# Patient Record
Sex: Female | Born: 1955 | Race: White | Hispanic: No | Marital: Married | Smoking: Current every day smoker
Health system: Southern US, Community
[De-identification: ages and names within clinical notes are randomized; demographics above are authoritative.]

## PROBLEM LIST (undated history)

## (undated) HISTORY — PX: NECK SURGERY: SHX720

## (undated) HISTORY — PX: APPENDECTOMY: SHX54

## (undated) HISTORY — PX: SMALL INTESTINE SURGERY: SHX150

## (undated) HISTORY — PX: BUNIONECTOMY: SHX129

## (undated) HISTORY — PX: TONSILLECTOMY: SHX5217

---

## 1997-10-12 ENCOUNTER — Other Ambulatory Visit: Admission: RE | Admit: 1997-10-12 | Discharge: 1997-10-12 | Payer: Self-pay | Admitting: Gynecology

## 2005-08-03 ENCOUNTER — Inpatient Hospital Stay (HOSPITAL_COMMUNITY): Admission: RE | Admit: 2005-08-03 | Discharge: 2005-08-04 | Payer: Self-pay | Admitting: Neurosurgery

## 2011-05-01 ENCOUNTER — Emergency Department (HOSPITAL_COMMUNITY)
Admission: EM | Admit: 2011-05-01 | Discharge: 2011-05-01 | Disposition: A | Discharge status: Home / Self Care | Payer: No Typology Code available for payment source | Attending: Emergency Medicine | Admitting: Emergency Medicine

## 2011-05-01 ENCOUNTER — Emergency Department (HOSPITAL_COMMUNITY): Payer: No Typology Code available for payment source

## 2011-05-01 DIAGNOSIS — R51 Headache: Secondary | ICD-10-CM | POA: Insufficient documentation

## 2011-05-01 DIAGNOSIS — M542 Cervicalgia: Secondary | ICD-10-CM | POA: Insufficient documentation

## 2016-01-29 DIAGNOSIS — M159 Polyosteoarthritis, unspecified: Secondary | ICD-10-CM

## 2016-01-29 DIAGNOSIS — I10 Essential (primary) hypertension: Secondary | ICD-10-CM

## 2016-01-29 DIAGNOSIS — Z79899 Other long term (current) drug therapy: Secondary | ICD-10-CM

## 2016-01-29 DIAGNOSIS — K219 Gastro-esophageal reflux disease without esophagitis: Secondary | ICD-10-CM | POA: Insufficient documentation

## 2016-01-29 HISTORY — DX: Essential (primary) hypertension: I10

## 2016-01-29 HISTORY — DX: Other long term (current) drug therapy: Z79.899

## 2016-01-29 HISTORY — DX: Gastro-esophageal reflux disease without esophagitis: K21.9

## 2016-01-29 HISTORY — DX: Polyosteoarthritis, unspecified: M15.9

## 2016-01-30 DIAGNOSIS — E119 Type 2 diabetes mellitus without complications: Secondary | ICD-10-CM

## 2016-01-30 HISTORY — DX: Type 2 diabetes mellitus without complications: E11.9

## 2017-12-02 DIAGNOSIS — F17200 Nicotine dependence, unspecified, uncomplicated: Secondary | ICD-10-CM

## 2017-12-02 HISTORY — DX: Nicotine dependence, unspecified, uncomplicated: F17.200

## 2019-05-01 DIAGNOSIS — E782 Mixed hyperlipidemia: Secondary | ICD-10-CM

## 2019-05-01 HISTORY — DX: Mixed hyperlipidemia: E78.2

## 2019-06-12 DIAGNOSIS — Z6830 Body mass index (BMI) 30.0-30.9, adult: Secondary | ICD-10-CM

## 2019-06-12 DIAGNOSIS — E6609 Other obesity due to excess calories: Secondary | ICD-10-CM

## 2019-06-12 HISTORY — DX: Body mass index (BMI) 30.0-30.9, adult: Z68.30

## 2019-06-12 HISTORY — DX: Other obesity due to excess calories: E66.09

## 2019-07-17 DIAGNOSIS — F17210 Nicotine dependence, cigarettes, uncomplicated: Secondary | ICD-10-CM | POA: Insufficient documentation

## 2019-07-17 DIAGNOSIS — M2420 Disorder of ligament, unspecified site: Secondary | ICD-10-CM

## 2019-07-17 HISTORY — DX: Disorder of ligament, unspecified site: M24.20

## 2021-01-13 DIAGNOSIS — R0602 Shortness of breath: Secondary | ICD-10-CM

## 2021-01-13 DIAGNOSIS — J4520 Mild intermittent asthma, uncomplicated: Secondary | ICD-10-CM

## 2021-01-13 HISTORY — DX: Shortness of breath: R06.02

## 2021-01-13 HISTORY — DX: Mild intermittent asthma, uncomplicated: J45.20

## 2021-01-15 DIAGNOSIS — R7982 Elevated C-reactive protein (CRP): Secondary | ICD-10-CM

## 2021-01-15 HISTORY — DX: Elevated C-reactive protein (CRP): R79.82

## 2021-01-16 ENCOUNTER — Encounter: Payer: Self-pay | Admitting: Cardiology

## 2021-01-16 ENCOUNTER — Encounter: Payer: Self-pay | Admitting: *Deleted

## 2021-02-18 NOTE — Progress Notes (Signed)
Cardiology Office Note:    Date:  02/19/2021   ID:  ROYLENE HEATON, DOB 09-29-1955, MRN [id number]  PCP:  Gordan Payment., MD  Cardiologist:  Norman Herrlich, MD   Referring MD: Gordan Payment., MD  ASSESSMENT:    1. Chest pain of uncertain etiology   2. CRP elevated   3. Type 2 diabetes mellitus without complication, without long-term current use of insulin (HCC)   4. Mixed hyperlipidemia   5. Essential hypertension   6. Cigarette smoker   7. Hypertensive heart disease without heart failure     PLAN:    In order of problems listed above:  She is at high cardiovascular risk age diabetes hypertension hyperlipidemia cigarette smoking her LDL cholesterol is elevated as is her high-sensitivity CRP and she agrees for lipid-lowering therapy she does not want a atorvastatin and prefers a low intensity statin and I will place her on pravastatin 20 mg daily pending cardiac CTA.  This will give [place] a calcium score to define the burden of coronary atherosclerosis and allow [place] to define if she has significant CAD.  If high risk markers would benefit from angiography and revascularization. CRP is another risk factor for heart disease. Stable managed by her PCP Untreated initiate statin therapy she request a low intensity statin she is afraid of drugs like atorvastatin Stable BP controlled although elevated in my office today she will continue her current treatment beta-blocker and ARB. Previously stopped smoking with hypnotism she is down to less than 10 cigarettes a day offered her nicotine substitutes she declined and said she would send me a MyChart message if she would like it.  Next appointment 4 weeks after cardiac CT   Medication Adjustments/Labs and Tests Ordered: Current medicines are reviewed at length with the patient today.  Concerns regarding medicines are outlined above.  Orders Placed This Encounter  Procedures   CT CORONARY MORPH W/CTA COR W/SCORE W/CA W/CM &/OR WO/CM   Basic  metabolic panel   EKG 12-Lead   Meds ordered this encounter  Medications   pravastatin (PRAVACHOL) 20 MG tablet    Sig: Take 1 tablet (20 mg total) by mouth every evening.    Dispense:  90 tablet    Refill:  3   metoprolol tartrate (LOPRESSOR) 100 MG tablet    Sig: Take 1 tablet (100 mg total) by mouth once for 1 dose. Take one tablet by mouth two hours prior to your cardiac CT    Dispense:  1 tablet    Refill:  0     Chief Complaint  Patient presents with   Chest Pain    I am concerned already of heart disease     History of Present Illness:    FREDDA Serrano is a 65 y.o. with multiple cardiovascular risk including ongoing cigarette smoking type 2 diabetes hyperlipidemia and family history of CAD female who is being seen today for the evaluation of shortness of breath at the request of Gordan Payment., MD. she recently has been seen in the office one of her complaints is arthritis.  Laboratory studies 01/16/2021 showed a hemoglobin A1c of 8.6% CMP normal potassium 4.7 creatinine 0.75 GFR 88 cc per minute TSH normal 3.37 Her lipid profile 01/13/2021 shows a cholesterol total 234 LDL 167 triglycerides 197 HDL 56 non-HDL cholesterol severely elevated for diabetes 178 Hemoglobin 14.7 platelets 308,000 sedimentation rate was normal at 15 and high-sensitivity CRP was elevated at 3.2  I have cared for her husband  and she is known to me. She has no background history of heart disease congenital rheumatic or atrial fibrillation. She has multiple cardiovascular risk including her diabetes hyperlipidemia hypertension and ongoing cigarette smoking and is concerned regarding her cardiovascular risk She also tells me she has had a few episodes of chest discomfort she describes as heaviness not with activity but caused her to stop and rest and she is relieved.  None have occurred at nighttime or awaken her from her sleep and she is always relieved with rest of this concern should be already of  heart disease. Her referral says shortness of breath but she tells me that its not a problem. No edema orthopnea palpitation or syncope. 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 Relevant Hx: Course: Daily Update: Today's Plan:discussed with her that her sugars will increase and she has actively worked on her diet and diet and lost from 242 to 188 which I commend her on, she is to continue with her weight loss efforts despite taking this   Electronically signed by: Sandi Mealy    Past Surgical History:  Procedure Laterality Date   APPENDECTOMY     BUNIONECTOMY Right    NECK SURGERY     SMALL INTESTINE SURGERY     TONSILLECTOMY      Current Medications: Current Meds  Medication Sig   albuterol (VENTOLIN HFA) 108 (90 Base) MCG/ACT inhaler Inhale 2 puffs into the lungs every 6 (six) hours as needed for wheezing or shortness of breath.   aspirin 81 MG chewable tablet Chew 81 mg by mouth daily.   Cinnamon 500 MG capsule Take 500 mg by mouth daily.   clobetasol cream (TEMOVATE) 0.05 % Apply 1 application topically 2 (two) times daily.   Green Tea 150 MG CAPS Take 1 capsule by mouth daily.    ibuprofen (ADVIL) 800 MG tablet Take 800 mg by mouth every 6 (six) hours as needed for pain.   losartan (COZAAR) 50 MG tablet Take 1 tablet by mouth daily.   metFORMIN (GLUCOPHAGE-XR) 500 MG 24 hr tablet Take 500 mg by mouth 2 (two) times daily.   metoprolol tartrate (LOPRESSOR) 100 MG tablet Take 1 tablet (100 mg total) by mouth once for 1 dose. Take one tablet by mouth two hours prior to your cardiac CT   Multiple Vitamin (MULTIVITAMIN) capsule Take 1 capsule by mouth daily.   Omega-3 1000 MG CAPS Take 1 g by mouth daily.   pravastatin (PRAVACHOL) 20 MG tablet Take 1 tablet (20 mg total) by mouth every evening.     Allergies:   Patient has no known allergies.   Social History   Socioeconomic History   Marital status: Married    Spouse name:  Not on file   Number of children: Not on file   Years of education: Not on file   Highest education level: Not on file  Occupational History   Not on file  Tobacco Use   Smoking status: Every Day    Packs/day: 0.50    Pack years: 0.00    Types: Cigarettes   Smokeless tobacco: Never  Vaping Use   Vaping Use: Never used  Substance and Sexual Activity   Alcohol use: Yes    Alcohol/week: 1.0 standard drink    Types: 1 Glasses of wine per week    Comment: Socially   Drug use: Never   Sexual activity: Not on file  Other Topics Concern   Not on file  Social History Narrative   Not on file   Social Determinants of Health   Financial Resource Strain: Not on file  Food Insecurity: Not on file  Transportation Needs: Not on file  Physical Activity: Not on file  Stress: Not on file  Social Connections: Not on file     Family History: The patient's family history includes Angina in her father; Diabetes in her mother and paternal grandmother; Heart attack (age of onset: 83) in her father; Heart attack (age of onset: 87) in her mother.  ROS:   ROS Please see the history of present illness.     All other systems reviewed and are  negative.  EKGs/Labs/Other Studies Reviewed:    The following studies were reviewed today:   EKG:  EKG is  ordered today.  The ekg ordered today is personally reviewed and demonstrates sinus rhythm minor nonspecific ST abnormality   Physical Exam:    VS:  BP (!) 145/90 (BP Location: Right Arm, Patient Position: Sitting, Cuff Size: Large) Comment (BP Location): Re-check  Pulse 75   Ht 5' 6.5" (1.689 m)   Wt 187 lb 3.2 oz (84.9 kg)   BMI 29.76 kg/m     Wt Readings from Last 3 Encounters:  02/19/21 187 lb 3.2 oz (84.9 kg)  01/13/21 186 lb (84.4 kg)     GEN: Appears her age well nourished, well developed in no acute distress HEENT: Normal NECK: No JVD; No carotid bruits LYMPHATICS: No lymphadenopathy CARDIAC: RRR, no murmurs, rubs, gallops RESPIRATORY:  Clear to auscultation without rales, wheezing or rhonchi  ABDOMEN: Soft, non-tender, non-distended MUSCULOSKELETAL:  No edema; No deformity  SKIN: Warm and dry NEUROLOGIC:  Alert and oriented x 3 PSYCHIATRIC:  Normal affect     Signed, Norman Herrlich, MD  02/19/2021 4:21 PM    [facility]

## 2021-02-19 ENCOUNTER — Encounter: Payer: Self-pay | Admitting: Cardiology

## 2021-02-19 ENCOUNTER — Ambulatory Visit (INDEPENDENT_AMBULATORY_CARE_PROVIDER_SITE_OTHER): Payer: Medicare Other | Admitting: Cardiology

## 2021-02-19 ENCOUNTER — Other Ambulatory Visit: Payer: Self-pay

## 2021-02-19 VITALS — BP 145/90 | HR 75 | Ht 66.5 in | Wt 187.2 lb

## 2021-02-19 DIAGNOSIS — E119 Type 2 diabetes mellitus without complications: Secondary | ICD-10-CM

## 2021-02-19 DIAGNOSIS — I119 Hypertensive heart disease without heart failure: Secondary | ICD-10-CM

## 2021-02-19 DIAGNOSIS — R7982 Elevated C-reactive protein (CRP): Secondary | ICD-10-CM

## 2021-02-19 DIAGNOSIS — R079 Chest pain, unspecified: Secondary | ICD-10-CM

## 2021-02-19 DIAGNOSIS — I10 Essential (primary) hypertension: Secondary | ICD-10-CM | POA: Diagnosis not present

## 2021-02-19 DIAGNOSIS — E782 Mixed hyperlipidemia: Secondary | ICD-10-CM

## 2021-02-19 DIAGNOSIS — F17210 Nicotine dependence, cigarettes, uncomplicated: Secondary | ICD-10-CM

## 2021-02-19 MED ORDER — PRAVASTATIN SODIUM 20 MG PO TABS: 20.0000 mg | ORAL_TABLET | Freq: Every evening | ORAL | 3 refills | Status: DC

## 2021-02-19 MED ORDER — METOPROLOL TARTRATE 100 MG PO TABS: 100.0000 mg | ORAL_TABLET | Freq: Once | ORAL | 0 refills | Status: DC

## 2021-02-19 NOTE — Patient Instructions (Signed)
Medication Instructions:  Your physician has recommended you make the following change in your medication:  START: Pravastatin 20 mg take one tablet by mouth daily.   *If you need a refill on your cardiac medications before your next appointment, please call your pharmacy*   Lab Work: Your physician recommends that you return for lab work in: WITHIN ONE WEEK OF YOUR CT  BMP If you have labs (blood work) drawn today and your tests are completely normal, you will receive your results only by: MyChart Message (if you have MyChart) OR A paper copy in the mail If you have any lab test that is abnormal or we need to change your treatment, we will call you to review the results.   Testing/Procedures: Your cardiac CT will be scheduled at the below location:   [facility] [address] [place], [place] [address] [phone]  If scheduled at [facility], please arrive at the [facility] main entrance (entrance A) of [facility] 30 minutes prior to test start time. [facility] (first floor) to check-in and test prep.  Please follow these instructions carefully (unless otherwise directed):   On the Night Before the Test: Be sure to Drink plenty of water. Do not consume any caffeinated/decaffeinated beverages or chocolate 12 hours prior to your test. Do not take any antihistamines 12 hours prior to your test.  On the Day of the Test: Drink plenty of water until 1 hour prior to the test. Do not eat any food 4 hours prior to the test. You may take your regular medications prior to the test.  Take metoprolol (Lopressor) two hours prior to test. FEMALES- please wear underwire-free bra if available  After the Test: Drink plenty of water. After receiving IV contrast, you may experience a mild flushed feeling. This is normal. On occasion, you may experience a mild rash up to 24 hours after the test. This is not dangerous. If  this occurs, you can take Benadryl 25 mg and increase your fluid intake. If you experience trouble breathing, this can be serious. If it is severe call 911 IMMEDIATELY. If it is mild, please call our office. If you take any of these medications: Glipizide/Metformin, Avandament, Glucavance, please do not take 48 hours after completing test unless otherwise instructed.   Once we have confirmed authorization from your insurance company, we will call you to set up a date and time for your test. Based on how quickly your insurance processes prior authorizations requests, please allow up to 4 weeks to be contacted for scheduling your Cardiac CT appointment. Be advised that routine Cardiac CT appointments could be scheduled as many as 8 weeks after your provider has ordered it.  For non-scheduling related questions, please contact the cardiac imaging nurse navigator should you have any questions/concerns: Rockwell Alexandria, Cardiac Imaging Nurse Navigator Larey Brick, Cardiac Imaging Nurse Navigator [facility] Direct Office Dial: [phone]   For scheduling needs, including cancellations and rescheduling, please call [place], [phone].    Follow-Up: At [facility], you and your health needs are our priority.  As part of our continuing mission to provide you with exceptional heart care, we have created designated Provider Care Teams.  These Care Teams include your primary Cardiologist (physician) and Advanced Practice Providers (APPs -  Physician Assistants and Nurse Practitioners) who all work together to provide you with the care you need, when you need it.  We recommend signing  up for the patient portal called "MyChart".  Sign up information is provided on this After Visit Summary.  MyChart is used to connect with patients for Virtual Visits (Telemedicine).  Patients are able to view lab/test results, encounter notes, upcoming appointments, etc.  Non-urgent messages  can be sent to your provider as well.   To learn more about what you can do with MyChart, go to [email or web address].    Your next appointment:   4 week(s)  The format for your next appointment:   In Person  Provider:   Norman Herrlich, MD   Other Instructions

## 2021-03-03 ENCOUNTER — Telehealth (HOSPITAL_COMMUNITY): Payer: Self-pay | Admitting: *Deleted

## 2021-03-03 NOTE — Telephone Encounter (Signed)
Attempted to call patient regarding upcoming cardiac CT appointment. °Left message on voicemail with name and callback number ° °Tylor Gambrill RN Navigator Cardiac Imaging °[facility] °[phone] Office °[phone] Cell ° °

## 2021-03-04 ENCOUNTER — Telehealth (HOSPITAL_COMMUNITY): Payer: Self-pay | Admitting: *Deleted

## 2021-03-04 ENCOUNTER — Telehealth: Payer: Self-pay

## 2021-03-04 LAB — BASIC METABOLIC PANEL
BUN/Creatinine Ratio: 13 (ref 12–28)
BUN: 9 mg/dL (ref 8–27)
CO2: 22 mmol/L (ref 20–29)
Calcium: 9.2 mg/dL (ref 8.7–10.3)
Chloride: 97 mmol/L (ref 96–106)
Creatinine, Ser: 0.69 mg/dL (ref 0.57–1.00)
Glucose: 263 mg/dL — ABNORMAL HIGH (ref 65–99)
Potassium: 4.3 mmol/L (ref 3.5–5.2)
Sodium: 137 mmol/L (ref 134–144)
eGFR: 96 mL/min/{1.73_m2} (ref >59)

## 2021-03-04 NOTE — Telephone Encounter (Signed)
Spoke with patient regarding results and recommendation.  Patient verbalizes understanding and is agreeable to plan of care. Advised patient to call back with any issues or concerns.  

## 2021-03-04 NOTE — Telephone Encounter (Signed)
-----   Message from Baldo Daub, MD sent at 03/04/2021  7:55 AM EDT ----- Normal or stable result  For cardiac CTA

## 2021-03-04 NOTE — Telephone Encounter (Signed)
Reaching out to patient to offer assistance regarding upcoming cardiac imaging study; pt verbalizes understanding of appt date/time, parking situation and where to check in, pre-test NPO status and medications ordered, and verified current allergies; name and call back number provided for further questions should they arise  Marjan Rosman RN Navigator Cardiac Imaging [facility] [phone] office [phone] cell  Pt to take 100mg metoprolol tartrate 2 hours prior to cardiac CT scan. 

## 2021-03-05 ENCOUNTER — Ambulatory Visit (HOSPITAL_COMMUNITY)
Admission: RE | Admit: 2021-03-05 | Discharge: 2021-03-05 | Disposition: A | Discharge status: Home / Self Care | Payer: Medicare Other | Source: Ambulatory Visit | Attending: Cardiology | Admitting: Cardiology

## 2021-03-05 ENCOUNTER — Ambulatory Visit (HOSPITAL_COMMUNITY)
Admission: RE | Admit: 2021-03-05 | Discharge: 2021-03-05 | Disposition: A | Discharge status: Home / Self Care | Payer: Medicare Other | Source: Ambulatory Visit | Attending: Internal Medicine | Admitting: Internal Medicine

## 2021-03-05 ENCOUNTER — Other Ambulatory Visit (HOSPITAL_COMMUNITY): Payer: Self-pay | Admitting: Emergency Medicine

## 2021-03-05 ENCOUNTER — Other Ambulatory Visit: Payer: Self-pay

## 2021-03-05 ENCOUNTER — Encounter (HOSPITAL_COMMUNITY): Payer: Self-pay

## 2021-03-05 DIAGNOSIS — E119 Type 2 diabetes mellitus without complications: Secondary | ICD-10-CM | POA: Diagnosis present

## 2021-03-05 DIAGNOSIS — I119 Hypertensive heart disease without heart failure: Secondary | ICD-10-CM | POA: Diagnosis present

## 2021-03-05 DIAGNOSIS — E782 Mixed hyperlipidemia: Secondary | ICD-10-CM | POA: Diagnosis present

## 2021-03-05 DIAGNOSIS — I10 Essential (primary) hypertension: Secondary | ICD-10-CM | POA: Insufficient documentation

## 2021-03-05 DIAGNOSIS — R7982 Elevated C-reactive protein (CRP): Secondary | ICD-10-CM | POA: Diagnosis present

## 2021-03-05 DIAGNOSIS — F17210 Nicotine dependence, cigarettes, uncomplicated: Secondary | ICD-10-CM | POA: Diagnosis present

## 2021-03-05 DIAGNOSIS — R079 Chest pain, unspecified: Secondary | ICD-10-CM

## 2021-03-05 DIAGNOSIS — R931 Abnormal findings on diagnostic imaging of heart and coronary circulation: Secondary | ICD-10-CM | POA: Diagnosis present

## 2021-03-05 DIAGNOSIS — I2510 Atherosclerotic heart disease of native coronary artery without angina pectoris: Secondary | ICD-10-CM | POA: Diagnosis not present

## 2021-03-05 MED ORDER — NITROGLYCERIN 0.4 MG SL SUBL
SUBLINGUAL_TABLET | SUBLINGUAL | Status: AC
  Administered 2021-03-05: 0.8 mg via SUBLINGUAL
  Filled 2021-03-05: qty 2

## 2021-03-05 MED ORDER — IOHEXOL 350 MG/ML SOLN
95.0000 mL | Freq: Once | INTRAVENOUS | Status: AC | PRN
  Administered 2021-03-05: 95 mL via INTRAVENOUS

## 2021-03-05 MED ORDER — NITROGLYCERIN 0.4 MG SL SUBL: 0.8000 mg | SUBLINGUAL_TABLET | Freq: Once | SUBLINGUAL | Status: AC

## 2021-03-06 ENCOUNTER — Ambulatory Visit (INDEPENDENT_AMBULATORY_CARE_PROVIDER_SITE_OTHER): Payer: Medicare Other | Admitting: Cardiology

## 2021-03-06 ENCOUNTER — Encounter: Payer: Self-pay | Admitting: Cardiology

## 2021-03-06 VITALS — BP 142/94 | HR 70 | Ht 66.5 in | Wt 187.0 lb

## 2021-03-06 DIAGNOSIS — I2510 Atherosclerotic heart disease of native coronary artery without angina pectoris: Secondary | ICD-10-CM | POA: Diagnosis not present

## 2021-03-06 DIAGNOSIS — I25118 Atherosclerotic heart disease of native coronary artery with other forms of angina pectoris: Secondary | ICD-10-CM | POA: Diagnosis not present

## 2021-03-06 MED ORDER — SODIUM CHLORIDE 0.9% FLUSH: 3.0000 mL | Freq: Two times a day (BID) | INTRAVENOUS | Status: DC

## 2021-03-06 MED ORDER — NITROGLYCERIN 0.4 MG SL SUBL: 0.4000 mg | SUBLINGUAL_TABLET | SUBLINGUAL | 3 refills | Status: DC | PRN

## 2021-03-06 MED ORDER — METOPROLOL TARTRATE 25 MG PO TABS: 25.0000 mg | ORAL_TABLET | Freq: Two times a day (BID) | ORAL | 3 refills | Status: DC

## 2021-03-06 NOTE — H&P (View-Only) (Signed)
Cardiology Office Note:    Date:  03/06/2021   ID:  Katie Serrano, DOB October 29, 1955, MRN [id number]  PCP:  Gordan Payment., MD  Cardiologist:  Norman Herrlich, MD    Referring MD: Gordan Payment., MD    ASSESSMENT:    1. Coronary artery disease of native artery of native heart with stable angina pectoris (HCC)    PLAN:    In order of problems listed above:  Cardiac CTA yesterday shows severe CAD RADS 4 I received communication that the right coronary artery looks occluded chronic and the LAD stenosis is flow-limiting.  I reviewed the findings options benefits and risk including PCI and we decided to proceed with coronary angiography next week after recheck her renal function continue aspirin statin add beta-blocker and as needed nitroglycerin.  Her other problems of type 2 diabetes and hyperlipidemia are stable and treated.  Risk benefits and options detailed.   Next appointment: 1 month   Medication Adjustments/Labs and Tests Ordered: Current medicines are reviewed at length with the patient today.  Concerns regarding medicines are outlined above.  No orders of the defined types were placed in this encounter.  No orders of the defined types were placed in this encounter.   Chief Complaint  Patient presents with   Follow-up    History of Present Illness:    Katie Serrano is a 65 y.o. female with a hx of ongoing cigarette smoking type 2 diabetes hyperlipidemia elevated CRP and exertional chest pain last seen 02/19/2021.  Compliance with diet, lifestyle and medications: Yes  After review of report and discussion with the interpreting cardiologist of brought back to my office today.  She has good healthcare literacy understands that she has multivessel CAD from [facility] of the right coronary artery appears to be more occluded and severely stenosed and the LAD stenosis is flow-limiting.  After discussion of the findings I advise she undergo coronary angiography.  She has been having  exertional shortness of breath and chest pain.  We will intensify medical therapy putting her on a beta-blocker giving her prescription for nitroglycerin continue aspirin and statin.  She has no contraindication to dual antiplatelet therapy and no dye allergy and we will plan the procedure at the end of next week rechecking her renal function.  Laboratory studies 01/16/2021 showed a hemoglobin A1c of 8.6% CMP normal potassium 4.7 creatinine 0.75 GFR 88 cc per minute TSH normal 3.37 Her lipid profile 01/13/2021 shows a cholesterol total 234 LDL 167 triglycerides 197 HDL 56 non-HDL cholesterol severely elevated for diabetes 178 Hemoglobin 14.7 platelets 308,000 sedimentation rate was normal at 15 and high-sensitivity CRP was elevated at 3.2  She had a cardiac CTA done yesterday and received several communications afterwards including a secure chat message that she had a facilitated FFR performed which showed chronic occlusion of the right coronary artery and significant flow-limiting LAD stenosis.  Aorta:  Normal size.  No calcifications.  No dissection.   Main Pulmonary Artery: Normal size of the pulmonary artery.   Aortic Valve:  Tri-leaflet.  No calcifications.   Coronary Arteries:  Normal coronary origin.  Co-dominance.   Coronary Calcium Score:   Left main: 0   Left anterior descending artery: 131   Left circumflex artery: 20   Right coronary artery: 10   Total: 161   Percentile: 86th for age, sex, and race matched control.   RCA is a large co-dominant artery that gives rise to PDA and PLA. There is appears to  be a severe stenosis 70-99% in the proximal vessel.   Left main is a large artery that gives rise to LAD and LCX arteries. There is no plaque.   LAD is a large vessel that gives rise to one large D1 Branch. There is a mixed plaque moderate stenosis (50-70%) in the mid LAD. There is a mild non-obstructive (25-49%) calcified plaque in the distal LAD.   LCX is a  co-dominant artery that gives rise to one large OM1 branch. There is a mild-non obstructive (24-49%) non calcified plaque the mid LCX.   Other findings:   Normal pulmonary vein drainage into the left atrium.   Normal left atrial appendage without a thrombus.   Extra-cardiac findings: See attached radiology report for non-cardiac structures.   IMPRESSION: 1. Coronary calcium score of 161. This was 86th percentile for age, sex, and race matched control.   2. Normal coronary origin with co-dominance.   3. CAD-RADS 4 Severe stenosis. (70-99% or > 50% left main). CT-FFR will be sent as time sensitive. Consider symptom-guided anti-ischemic pharmacotherapy as well as risk factor modification per guideline directed care. Cardiac catheterization may be appropriate.   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 Relevant Hx: Course: Daily Update: Today's Plan:discussed with her that her sugars will increase and she has actively worked on her diet and diet and lost from 242 to 188 which I commend her on, she is to continue with her weight loss efforts despite taking this   Electronically signed by: Sandi Mealy    Past Surgical History:  Procedure  Laterality Date   APPENDECTOMY     BUNIONECTOMY Right    NECK SURGERY     SMALL INTESTINE SURGERY     TONSILLECTOMY      Current Medications: Current Meds  Medication Sig   albuterol (VENTOLIN HFA) 108 (90 Base) MCG/ACT inhaler Inhale 2 puffs into the lungs every 6 (six) hours as needed for wheezing or shortness of breath.   aspirin 81 MG chewable tablet Chew 81 mg by mouth daily.   Cinnamon 500 MG capsule Take 500 mg by mouth daily.   clobetasol cream (TEMOVATE) 0.05 % Apply 1 application topically 2 (two) times daily.   Green Tea 150 MG CAPS Take 1 capsule by mouth daily.   ibuprofen (ADVIL) 800 MG tablet Take 800 mg by mouth every 6 (six) hours as needed for pain.   losartan (COZAAR) 50 MG tablet Take 1 tablet by mouth daily.   metFORMIN (GLUCOPHAGE-XR) 500 MG 24 hr tablet Take 500 mg by mouth 2 (two) times daily.   Multiple Vitamin (MULTIVITAMIN) capsule Take 1 capsule by mouth daily.   Omega-3 1000 MG CAPS Take 1 g by mouth daily.   pravastatin (PRAVACHOL) 20 MG tablet Take 1 tablet (20 mg  total) by mouth every evening.     Allergies:   Patient has no known allergies.   Social History   Socioeconomic History   Marital status: Married    Spouse name: Not on file   Number of children: Not on file   Years of education: Not on file   Highest education level: Not on file  Occupational History   Not on file  Tobacco Use   Smoking status: Every Day    Packs/day: 0.50    Pack years: 0.00    Types: Cigarettes   Smokeless tobacco: Never  Vaping Use   Vaping Use: Never used  Substance and Sexual Activity   Alcohol use: Yes    Alcohol/week: 1.0 standard drink    Types: 1 Glasses of wine per week    Comment: Socially   Drug use: Never   Sexual activity: Not on file  Other Topics Concern   Not on file  Social History Narrative   Not on file   Social Determinants of Health   Financial Resource Strain: Not on file  Food Insecurity: Not on file  Transportation Needs:  Not on file  Physical Activity: Not on file  Stress: Not on file  Social Connections: Not on file     Family History: The patient's family history includes Angina in her father; Diabetes in her mother and paternal grandmother; Heart attack (age of onset: 71) in her father; Heart attack (age of onset: 52) in her mother. ROS:   Please see the history of present illness.    All other systems reviewed and are negative.  EKGs/Labs/Other Studies Reviewed:    The following studies were reviewed today:  EKG:  EKG performed last visit 02/19/2021 showed sinus rhythm nonspecific ST diffuse abnormality  Recent Labs: 03/03/2021: BUN 9; Creatinine, Ser 0.69; Potassium 4.3; Sodium 137    Physical Exam:    VS:  BP (!) 142/94 (BP Location: Left Arm, Patient Position: Sitting, Cuff Size: Normal)   Pulse 70   Ht 5' 6.5" (1.689 m)   Wt 187 lb (84.8 kg)   SpO2 94%   BMI 29.73 kg/m     Wt Readings from Last 3 Encounters:  03/06/21 187 lb (84.8 kg)  02/19/21 187 lb 3.2 oz (84.9 kg)  01/13/21 186 lb (84.4 kg)     GEN:  Well nourished, well developed in no acute distress HEENT: Normal NECK: No JVD; No carotid bruits LYMPHATICS: No lymphadenopathy CARDIAC: RRR, no murmurs, rubs, gallops RESPIRATORY:  Clear to auscultation without rales, wheezing or rhonchi  ABDOMEN: Soft, non-tender, non-distended MUSCULOSKELETAL:  No edema; No deformity  SKIN: Warm and dry NEUROLOGIC:  Alert and oriented x 3 PSYCHIATRIC:  Normal affect    Signed, Norman Herrlich, MD  03/06/2021 8:26 AM    [facility]

## 2021-03-06 NOTE — Patient Instructions (Addendum)
Medication Instructions:  Your physician has recommended you make the following change in your medication:  START: Lopressor 25 mg take one tablet by mouth twice daily.  START: Nitroglycerin 0.4 mg take one tablet by mouth every 5 minutes up to three times as needed for chest pain.  *If you need a refill on your cardiac medications before your next appointment, please call your pharmacy*   Lab Work: Your physician recommends that you return for lab work in: TODAY CBC, BMP If you have labs (blood work) drawn today and your tests are completely normal, you will receive your results only by: MyChart Message (if you have MyChart) OR A paper copy in the mail If you have any lab test that is abnormal or we need to change your treatment, we will call you to review the results.   Testing/Procedures:    [facility]ARD[facility]OVASCULAR DIVISION [facility] [place] [address] [place] [address] Dept: [phone] Loc: [phone]  PRINCETTA UPLINGER  03/06/2021  You are scheduled for a Cardiac Catheterization on Wednesday, July 6 with Dr. Nicki Guadalajara.  1. Please arrive at the [facility] (Main Entrance A) at [facility]: [address] [place], [place] [address] at 8:00 AM (This time is two hours before your procedure to ensure your preparation). Free valet parking service is available.   Special note: Every effort is made to have your procedure done on time. Please understand that emergencies sometimes delay scheduled procedures.  2. Diet: Do not eat solid foods after midnight.  The patient may have clear liquids until 5am upon the day of the procedure.  3. Labs: YOU HAD YOUR LABS DRAWN TODAY 03/06/2021  4. Medication instructions in preparation for your procedure:   Contrast Allergy: No  Do not take Diabetes Med Glucophage (Metformin) on the day of the procedure and HOLD 48 HOURS AFTER THE PROCEDURE.  On the morning of your procedure, take  your Aspirin and any morning medicines NOT listed above.  You may use sips of water.  5. Plan for one night stay--bring personal belongings. 6. Bring a current list of your medications and current insurance cards. 7. You MUST have a responsible person to drive you home. 8. Someone MUST be with you the first 24 hours after you arrive home or your discharge will be delayed. 9. Please wear clothes that are easy to get on and off and wear slip-on shoes.  Thank you for allowing [place] to care for you!   -- [facility]    Follow-Up: At [facility], you and your health needs are our priority.  As part of our continuing mission to provide you with exceptional heart care, we have created designated Provider Care Teams.  These Care Teams include your primary Cardiologist (physician) and Advanced Practice Providers (APPs -  Physician Assistants and Nurse Practitioners) who all work together to provide you with the care you need, when you need it.  We recommend signing up for the patient portal called "MyChart".  Sign up information is provided on this After Visit Summary.  MyChart is used to connect with patients for Virtual Visits (Telemedicine).  Patients are able to view lab/test results, encounter notes, upcoming appointments, etc.  Non-urgent messages can be sent to your provider as well.   To learn more about what you can do with MyChart, go to [email or web address].    Your next appointment:   4 week(s)  The format for your  next appointment:   In Person  Provider:   Shirlee More, MD   Other Instructions

## 2021-03-06 NOTE — Progress Notes (Signed)
Cardiology Office Note:    Date:  03/06/2021   ID:  Katie Serrano, DOB October 29, 1955, MRN [id number]  PCP:  Gordan Payment., MD  Cardiologist:  Norman Herrlich, MD    Referring MD: Gordan Payment., MD    ASSESSMENT:    1. Coronary artery disease of native artery of native heart with stable angina pectoris (HCC)    PLAN:    In order of problems listed above:  Cardiac CTA yesterday shows severe CAD RADS 4 I received communication that the right coronary artery looks occluded chronic and the LAD stenosis is flow-limiting.  I reviewed the findings options benefits and risk including PCI and we decided to proceed with coronary angiography next week after recheck her renal function continue aspirin statin add beta-blocker and as needed nitroglycerin.  Her other problems of type 2 diabetes and hyperlipidemia are stable and treated.  Risk benefits and options detailed.   Next appointment: 1 month   Medication Adjustments/Labs and Tests Ordered: Current medicines are reviewed at length with the patient today.  Concerns regarding medicines are outlined above.  No orders of the defined types were placed in this encounter.  No orders of the defined types were placed in this encounter.   Chief Complaint  Patient presents with   Follow-up    History of Present Illness:    Katie Serrano is a 65 y.o. female with a hx of ongoing cigarette smoking type 2 diabetes hyperlipidemia elevated CRP and exertional chest pain last seen 02/19/2021.  Compliance with diet, lifestyle and medications: Yes  After review of report and discussion with the interpreting cardiologist of brought back to my office today.  She has good healthcare literacy understands that she has multivessel CAD from [facility] of the right coronary artery appears to be more occluded and severely stenosed and the LAD stenosis is flow-limiting.  After discussion of the findings I advise she undergo coronary angiography.  She has been having  exertional shortness of breath and chest pain.  We will intensify medical therapy putting her on a beta-blocker giving her prescription for nitroglycerin continue aspirin and statin.  She has no contraindication to dual antiplatelet therapy and no dye allergy and we will plan the procedure at the end of next week rechecking her renal function.  Laboratory studies 01/16/2021 showed a hemoglobin A1c of 8.6% CMP normal potassium 4.7 creatinine 0.75 GFR 88 cc per minute TSH normal 3.37 Her lipid profile 01/13/2021 shows a cholesterol total 234 LDL 167 triglycerides 197 HDL 56 non-HDL cholesterol severely elevated for diabetes 178 Hemoglobin 14.7 platelets 308,000 sedimentation rate was normal at 15 and high-sensitivity CRP was elevated at 3.2  She had a cardiac CTA done yesterday and received several communications afterwards including a secure chat message that she had a facilitated FFR performed which showed chronic occlusion of the right coronary artery and significant flow-limiting LAD stenosis.  Aorta:  Normal size.  No calcifications.  No dissection.   Main Pulmonary Artery: Normal size of the pulmonary artery.   Aortic Valve:  Tri-leaflet.  No calcifications.   Coronary Arteries:  Normal coronary origin.  Co-dominance.   Coronary Calcium Score:   Left main: 0   Left anterior descending artery: 131   Left circumflex artery: 20   Right coronary artery: 10   Total: 161   Percentile: 86th for age, sex, and race matched control.   RCA is a large co-dominant artery that gives rise to PDA and PLA. There is appears to  be a severe stenosis 70-99% in the proximal vessel.   Left main is a large artery that gives rise to LAD and LCX arteries. There is no plaque.   LAD is a large vessel that gives rise to one large D1 Branch. There is a mixed plaque moderate stenosis (50-70%) in the mid LAD. There is a mild non-obstructive (25-49%) calcified plaque in the distal LAD.   LCX is a  co-dominant artery that gives rise to one large OM1 branch. There is a mild-non obstructive (24-49%) non calcified plaque the mid LCX.   Other findings:   Normal pulmonary vein drainage into the left atrium.   Normal left atrial appendage without a thrombus.   Extra-cardiac findings: See attached radiology report for non-cardiac structures.   IMPRESSION: 1. Coronary calcium score of 161. This was 86th percentile for age, sex, and race matched control.   2. Normal coronary origin with co-dominance.   3. CAD-RADS 4 Severe stenosis. (70-99% or > 50% left main). CT-FFR will be sent as time sensitive. Consider symptom-guided anti-ischemic pharmacotherapy as well as risk factor modification per guideline directed care. Cardiac catheterization may be appropriate.   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 Relevant Hx: Course: Daily Update: Today's Plan:discussed with her that her sugars will increase and she has actively worked on her diet and diet and lost from 242 to 188 which I commend her on, she is to continue with her weight loss efforts despite taking this   Electronically signed by: Sandi Mealy    Past Surgical History:  Procedure  Laterality Date   APPENDECTOMY     BUNIONECTOMY Right    NECK SURGERY     SMALL INTESTINE SURGERY     TONSILLECTOMY      Current Medications: Current Meds  Medication Sig   albuterol (VENTOLIN HFA) 108 (90 Base) MCG/ACT inhaler Inhale 2 puffs into the lungs every 6 (six) hours as needed for wheezing or shortness of breath.   aspirin 81 MG chewable tablet Chew 81 mg by mouth daily.   Cinnamon 500 MG capsule Take 500 mg by mouth daily.   clobetasol cream (TEMOVATE) 0.05 % Apply 1 application topically 2 (two) times daily.   Green Tea 150 MG CAPS Take 1 capsule by mouth daily.   ibuprofen (ADVIL) 800 MG tablet Take 800 mg by mouth every 6 (six) hours as needed for pain.   losartan (COZAAR) 50 MG tablet Take 1 tablet by mouth daily.   metFORMIN (GLUCOPHAGE-XR) 500 MG 24 hr tablet Take 500 mg by mouth 2 (two) times daily.   Multiple Vitamin (MULTIVITAMIN) capsule Take 1 capsule by mouth daily.   Omega-3 1000 MG CAPS Take 1 g by mouth daily.   pravastatin (PRAVACHOL) 20 MG tablet Take 1 tablet (20 mg  total) by mouth every evening.     Allergies:   Patient has no known allergies.   Social History   Socioeconomic History   Marital status: Married    Spouse name: Not on file   Number of children: Not on file   Years of education: Not on file   Highest education level: Not on file  Occupational History   Not on file  Tobacco Use   Smoking status: Every Day    Packs/day: 0.50    Pack years: 0.00    Types: Cigarettes   Smokeless tobacco: Never  Vaping Use   Vaping Use: Never used  Substance and Sexual Activity   Alcohol use: Yes    Alcohol/week: 1.0 standard drink    Types: 1 Glasses of wine per week    Comment: Socially   Drug use: Never   Sexual activity: Not on file  Other Topics Concern   Not on file  Social History Narrative   Not on file   Social Determinants of Health   Financial Resource Strain: Not on file  Food Insecurity: Not on file  Transportation Needs:  Not on file  Physical Activity: Not on file  Stress: Not on file  Social Connections: Not on file     Family History: The patient's family history includes Angina in her father; Diabetes in her mother and paternal grandmother; Heart attack (age of onset: 71) in her father; Heart attack (age of onset: 52) in her mother. ROS:   Please see the history of present illness.    All other systems reviewed and are negative.  EKGs/Labs/Other Studies Reviewed:    The following studies were reviewed today:  EKG:  EKG performed last visit 02/19/2021 showed sinus rhythm nonspecific ST diffuse abnormality  Recent Labs: 03/03/2021: BUN 9; Creatinine, Ser 0.69; Potassium 4.3; Sodium 137    Physical Exam:    VS:  BP (!) 142/94 (BP Location: Left Arm, Patient Position: Sitting, Cuff Size: Normal)   Pulse 70   Ht 5' 6.5" (1.689 m)   Wt 187 lb (84.8 kg)   SpO2 94%   BMI 29.73 kg/m     Wt Readings from Last 3 Encounters:  03/06/21 187 lb (84.8 kg)  02/19/21 187 lb 3.2 oz (84.9 kg)  01/13/21 186 lb (84.4 kg)     GEN:  Well nourished, well developed in no acute distress HEENT: Normal NECK: No JVD; No carotid bruits LYMPHATICS: No lymphadenopathy CARDIAC: RRR, no murmurs, rubs, gallops RESPIRATORY:  Clear to auscultation without rales, wheezing or rhonchi  ABDOMEN: Soft, non-tender, non-distended MUSCULOSKELETAL:  No edema; No deformity  SKIN: Warm and dry NEUROLOGIC:  Alert and oriented x 3 PSYCHIATRIC:  Normal affect    Signed, Norman Herrlich, MD  03/06/2021 8:26 AM    [facility]

## 2021-03-07 ENCOUNTER — Telehealth: Payer: Self-pay

## 2021-03-07 LAB — BASIC METABOLIC PANEL
BUN/Creatinine Ratio: 11 — ABNORMAL LOW (ref 12–28)
BUN: 8 mg/dL (ref 8–27)
CO2: 25 mmol/L (ref 20–29)
Calcium: 9.5 mg/dL (ref 8.7–10.3)
Chloride: 98 mmol/L (ref 96–106)
Creatinine, Ser: 0.74 mg/dL (ref 0.57–1.00)
Glucose: 243 mg/dL — ABNORMAL HIGH (ref 65–99)
Potassium: 5 mmol/L (ref 3.5–5.2)
Sodium: 135 mmol/L (ref 134–144)
eGFR: 90 mL/min/{1.73_m2} (ref >59)

## 2021-03-07 LAB — CBC
Hematocrit: 44.4 % (ref 34.0–46.6)
Hemoglobin: 14.5 g/dL (ref 11.1–15.9)
MCH: 29 pg (ref 26.6–33.0)
MCHC: 32.7 g/dL (ref 31.5–35.7)
MCV: 89 fL (ref 79–97)
Platelets: 331 10*3/uL (ref 150–450)
RBC: 5 x10E6/uL (ref 3.77–5.28)
RDW: 12.8 % (ref 11.7–15.4)
WBC: 9.6 10*3/uL (ref 3.4–10.8)

## 2021-03-07 NOTE — Telephone Encounter (Signed)
Spoke with patient regarding results and recommendation.  Patient verbalizes understanding and is agreeable to plan of care. Advised patient to call back with any issues or concerns.  

## 2021-03-07 NOTE — Telephone Encounter (Signed)
-----   Message from Baldo Daub, MD sent at 03/07/2021  8:02 AM EDT ----- Normal or stable result  No changes

## 2021-03-11 ENCOUNTER — Telehealth: Payer: Self-pay | Admitting: *Deleted

## 2021-03-11 NOTE — Telephone Encounter (Signed)
Pt contacted pre-catheterization scheduled at [facility] for: Wednesday March 12, 2021 10 AM Verified arrival time and place: [facility] Main Entrance A [facility]) at: 8 AM   No solid food after midnight prior to cath, clear liquids until 5 AM day of procedure.  Hold: Metformin-day of procedure and 48 hours post procedure   Except hold medications AM meds can be  taken pre-cath with sips of water including: aspirin 81 mg   Confirmed patient has responsible adult to drive home post procedure and be with patient first 24 hours after arriving home: yes  You are allowed ONE visitor in the waiting room during the time you are at the hospital for your procedure. Both you and your visitor must wear a mask once you enter the hospital.   Patient reports does not currently have any symptoms concerning for COVID-19 and no household members with COVID-19 like illness.      Reviewed procedure/mask/visitor instructions with patient.

## 2021-03-12 ENCOUNTER — Other Ambulatory Visit: Payer: Self-pay

## 2021-03-12 ENCOUNTER — Encounter (HOSPITAL_COMMUNITY)
Admission: RE | Disposition: A | Discharge status: Home / Self Care | Payer: Self-pay | Source: Home / Self Care | Attending: Cardiovascular Disease

## 2021-03-12 ENCOUNTER — Ambulatory Visit (HOSPITAL_COMMUNITY)
Admission: RE | Admit: 2021-03-12 | Discharge: 2021-03-12 | Disposition: A | Discharge status: Home / Self Care | Payer: Medicare Other | Attending: Cardiovascular Disease | Admitting: Cardiovascular Disease

## 2021-03-12 ENCOUNTER — Encounter (HOSPITAL_COMMUNITY): Payer: Self-pay | Admitting: Cardiovascular Disease

## 2021-03-12 DIAGNOSIS — Z7984 Long term (current) use of oral hypoglycemic drugs: Secondary | ICD-10-CM | POA: Insufficient documentation

## 2021-03-12 DIAGNOSIS — I25118 Atherosclerotic heart disease of native coronary artery with other forms of angina pectoris: Secondary | ICD-10-CM | POA: Diagnosis not present

## 2021-03-12 DIAGNOSIS — Z7982 Long term (current) use of aspirin: Secondary | ICD-10-CM | POA: Diagnosis not present

## 2021-03-12 DIAGNOSIS — E119 Type 2 diabetes mellitus without complications: Secondary | ICD-10-CM | POA: Insufficient documentation

## 2021-03-12 DIAGNOSIS — Z79899 Other long term (current) drug therapy: Secondary | ICD-10-CM | POA: Diagnosis not present

## 2021-03-12 DIAGNOSIS — R931 Abnormal findings on diagnostic imaging of heart and coronary circulation: Secondary | ICD-10-CM | POA: Diagnosis present

## 2021-03-12 DIAGNOSIS — I2582 Chronic total occlusion of coronary artery: Secondary | ICD-10-CM | POA: Insufficient documentation

## 2021-03-12 DIAGNOSIS — E782 Mixed hyperlipidemia: Secondary | ICD-10-CM | POA: Diagnosis not present

## 2021-03-12 DIAGNOSIS — F17210 Nicotine dependence, cigarettes, uncomplicated: Secondary | ICD-10-CM | POA: Insufficient documentation

## 2021-03-12 DIAGNOSIS — I10 Essential (primary) hypertension: Secondary | ICD-10-CM | POA: Insufficient documentation

## 2021-03-12 HISTORY — PX: LEFT HEART CATH AND CORONARY ANGIOGRAPHY: CATH118249

## 2021-03-12 LAB — GLUCOSE, CAPILLARY
Glucose-Capillary: 288 mg/dL — ABNORMAL HIGH (ref 70–99)
Glucose-Capillary: 278 mg/dL — ABNORMAL HIGH (ref 70–99)

## 2021-03-12 SURGERY — LEFT HEART CATH AND CORONARY ANGIOGRAPHY
Anesthesia: LOCAL

## 2021-03-12 MED ORDER — ATORVASTATIN CALCIUM 80 MG PO TABS: 80.0000 mg | ORAL_TABLET | Freq: Every day | ORAL | 1 refills | Status: DC

## 2021-03-12 MED ORDER — FENTANYL CITRATE (PF) 100 MCG/2ML IJ SOLN
INTRAMUSCULAR | Status: DC | PRN
  Administered 2021-03-12 (×2): 25 ug via INTRAVENOUS

## 2021-03-12 MED ORDER — SODIUM CHLORIDE 0.9 % IV SOLN: 250.0000 mL | INTRAVENOUS | Status: DC | PRN

## 2021-03-12 MED ORDER — SODIUM CHLORIDE 0.9% FLUSH: 3.0000 mL | INTRAVENOUS | Status: DC | PRN

## 2021-03-12 MED ORDER — ASPIRIN 81 MG PO CHEW
81.0000 mg | CHEWABLE_TABLET | ORAL | Status: DC
Start: 2021-03-12 — End: 2021-03-12

## 2021-03-12 MED ORDER — ATORVASTATIN CALCIUM 80 MG PO TABS: 80.0000 mg | ORAL_TABLET | Freq: Every day | ORAL | Status: DC

## 2021-03-12 MED ORDER — LIDOCAINE HCL (PF) 1 % IJ SOLN
INTRAMUSCULAR | Status: DC | PRN
  Administered 2021-03-12: 2 mL

## 2021-03-12 MED ORDER — AMLODIPINE BESYLATE 5 MG PO TABS: 5.0000 mg | ORAL_TABLET | Freq: Every day | ORAL | 1 refills | Status: DC

## 2021-03-12 MED ORDER — HEPARIN SODIUM (PORCINE) 1000 UNIT/ML IJ SOLN
INTRAMUSCULAR | Status: AC
  Filled 2021-03-12: qty 1

## 2021-03-12 MED ORDER — MIDAZOLAM HCL 2 MG/2ML IJ SOLN
INTRAMUSCULAR | Status: AC
  Filled 2021-03-12: qty 2

## 2021-03-12 MED ORDER — HYDRALAZINE HCL 20 MG/ML IJ SOLN: 10.0000 mg | INTRAMUSCULAR | Status: DC | PRN

## 2021-03-12 MED ORDER — MIDAZOLAM HCL 2 MG/2ML IJ SOLN
INTRAMUSCULAR | Status: DC | PRN
  Administered 2021-03-12: 1 mg via INTRAVENOUS
  Administered 2021-03-12: 2 mg via INTRAVENOUS

## 2021-03-12 MED ORDER — DIAZEPAM 5 MG PO TABS: 5.0000 mg | ORAL_TABLET | ORAL | Status: DC | PRN

## 2021-03-12 MED ORDER — AMLODIPINE BESYLATE 5 MG PO TABS
5.0000 mg | ORAL_TABLET | Freq: Every day | ORAL | Status: DC
  Administered 2021-03-12: 5 mg via ORAL
  Filled 2021-03-12: qty 1

## 2021-03-12 MED ORDER — SODIUM CHLORIDE 0.9% FLUSH: 3.0000 mL | Freq: Two times a day (BID) | INTRAVENOUS | Status: DC

## 2021-03-12 MED ORDER — HYDRALAZINE HCL 20 MG/ML IJ SOLN
INTRAMUSCULAR | Status: DC | PRN
  Administered 2021-03-12: 10 mg via INTRAVENOUS

## 2021-03-12 MED ORDER — ASPIRIN 81 MG PO CHEW: 81.0000 mg | CHEWABLE_TABLET | Freq: Every day | ORAL | Status: DC

## 2021-03-12 MED ORDER — SODIUM CHLORIDE 0.9 % IV SOLN: INTRAVENOUS | Status: DC

## 2021-03-12 MED ORDER — HYDRALAZINE HCL 20 MG/ML IJ SOLN
INTRAMUSCULAR | Status: AC
  Filled 2021-03-12: qty 1

## 2021-03-12 MED ORDER — IOHEXOL 350 MG/ML SOLN
INTRAVENOUS | Status: DC | PRN
  Administered 2021-03-12: 75 mL

## 2021-03-12 MED ORDER — LIDOCAINE HCL (PF) 1 % IJ SOLN
INTRAMUSCULAR | Status: AC
  Filled 2021-03-12: qty 30

## 2021-03-12 MED ORDER — HEPARIN (PORCINE) IN NACL 1000-0.9 UT/500ML-% IV SOLN
INTRAVENOUS | Status: AC
  Filled 2021-03-12: qty 1000

## 2021-03-12 MED ORDER — FENTANYL CITRATE (PF) 100 MCG/2ML IJ SOLN
INTRAMUSCULAR | Status: AC
  Filled 2021-03-12: qty 2

## 2021-03-12 MED ORDER — HEPARIN SODIUM (PORCINE) 1000 UNIT/ML IJ SOLN
INTRAMUSCULAR | Status: DC | PRN
  Administered 2021-03-12: 4000 [IU] via INTRAVENOUS

## 2021-03-12 MED ORDER — ASPIRIN 81 MG PO CHEW: 81.0000 mg | CHEWABLE_TABLET | ORAL | Status: DC

## 2021-03-12 MED ORDER — VERAPAMIL HCL 2.5 MG/ML IV SOLN
INTRAVENOUS | Status: DC | PRN
  Administered 2021-03-12: 10 mL via INTRA_ARTERIAL

## 2021-03-12 MED ORDER — ACETAMINOPHEN 325 MG PO TABS: 650.0000 mg | ORAL_TABLET | ORAL | Status: DC | PRN

## 2021-03-12 MED ORDER — SODIUM CHLORIDE 0.9 % WEIGHT BASED INFUSION
3.0000 mL/kg/h | INTRAVENOUS | Status: AC
  Administered 2021-03-12: 3 mL/kg/h via INTRAVENOUS

## 2021-03-12 MED ORDER — VERAPAMIL HCL 2.5 MG/ML IV SOLN
INTRAVENOUS | Status: AC
  Filled 2021-03-12: qty 2

## 2021-03-12 MED ORDER — LABETALOL HCL 5 MG/ML IV SOLN: 10.0000 mg | INTRAVENOUS | Status: DC | PRN

## 2021-03-12 MED ORDER — SODIUM CHLORIDE 0.9 % WEIGHT BASED INFUSION: 1.0000 mL/kg/h | INTRAVENOUS | Status: DC

## 2021-03-12 MED ORDER — ONDANSETRON HCL 4 MG/2ML IJ SOLN: 4.0000 mg | Freq: Four times a day (QID) | INTRAMUSCULAR | Status: DC | PRN

## 2021-03-12 MED ORDER — HEPARIN (PORCINE) IN NACL 1000-0.9 UT/500ML-% IV SOLN
INTRAVENOUS | Status: DC | PRN
  Administered 2021-03-12 (×2): 500 mL

## 2021-03-12 SURGICAL SUPPLY — 12 items
CATH INFINITI 5 FR JL3.5 (CATHETERS) ×1 IMPLANT
CATH INFINITI JR4 5F (CATHETERS) ×1 IMPLANT
CATH OPTITORQUE TIG 4.0 5F (CATHETERS) ×1 IMPLANT
DEVICE RAD COMP TR BAND LRG (VASCULAR PRODUCTS) ×1 IMPLANT
GLIDESHEATH SLEND SS 6F .021 (SHEATH) ×1 IMPLANT
KIT HEART LEFT (KITS) ×2 IMPLANT
PACK CARDIAC CATHETERIZATION (CUSTOM PROCEDURE TRAY) ×2 IMPLANT
SHEATH PROBE COVER 6X72 (BAG) ×1 IMPLANT
TRANSDUCER W/STOPCOCK (MISCELLANEOUS) ×2 IMPLANT
TUBING CIL FLEX 10 FLL-RA (TUBING) ×2 IMPLANT
WIRE HI TORQ VERSACORE-J 145CM (WIRE) ×1 IMPLANT
WIRE ROSEN-J .035X260CM (WIRE) ×1 IMPLANT

## 2021-03-12 NOTE — Interval H&P Note (Signed)
Cath Lab Visit (complete for each Cath Lab visit)  Clinical Evaluation Leading to the Procedure:   ACS: No.  Non-ACS:    Anginal Classification: CCS II  Anti-ischemic medical therapy: Minimal Therapy (1 class of medications)  Non-Invasive Test Results: Intermediate-risk stress test findings: cardiac mortality 1-3%/year  Prior CABG: No previous CABG      History and Physical Interval Note:  03/12/2021 9:39 AM  Katie Serrano  has presented today for surgery, with the diagnosis of CAD.  The various methods of treatment have been discussed with the patient and family. After consideration of risks, benefits and other options for treatment, the patient has consented to  Procedure(s): LEFT HEART CATH AND CORONARY ANGIOGRAPHY (N/A) as a surgical intervention.  The patient's history has been reviewed, patient examined, no change in status, stable for surgery.  I have reviewed the patient's chart and labs.  Questions were answered to the patient's satisfaction.     Nicki Guadalajara

## 2021-03-21 ENCOUNTER — Ambulatory Visit: Payer: Medicare Other | Admitting: Cardiology

## 2021-03-30 ENCOUNTER — Emergency Department (HOSPITAL_COMMUNITY): Payer: Medicare Other

## 2021-03-30 ENCOUNTER — Inpatient Hospital Stay (HOSPITAL_COMMUNITY)
Admission: EM | Admit: 2021-03-30 | Discharge: 2021-04-10 | DRG: 853 | Disposition: A | Discharge status: Rehabilitation Facility | Payer: Medicare Other | Attending: Family Medicine | Admitting: Family Medicine

## 2021-03-30 ENCOUNTER — Encounter (HOSPITAL_COMMUNITY): Payer: Self-pay | Admitting: *Deleted

## 2021-03-30 ENCOUNTER — Other Ambulatory Visit: Payer: Self-pay

## 2021-03-30 DIAGNOSIS — R81 Glycosuria: Secondary | ICD-10-CM | POA: Diagnosis present

## 2021-03-30 DIAGNOSIS — M4646 Discitis, unspecified, lumbar region: Secondary | ICD-10-CM | POA: Diagnosis present

## 2021-03-30 DIAGNOSIS — L989 Disorder of the skin and subcutaneous tissue, unspecified: Secondary | ICD-10-CM

## 2021-03-30 DIAGNOSIS — Z79899 Other long term (current) drug therapy: Secondary | ICD-10-CM

## 2021-03-30 DIAGNOSIS — L0292 Furuncle, unspecified: Secondary | ICD-10-CM | POA: Diagnosis present

## 2021-03-30 DIAGNOSIS — N390 Urinary tract infection, site not specified: Secondary | ICD-10-CM

## 2021-03-30 DIAGNOSIS — E860 Dehydration: Secondary | ICD-10-CM | POA: Diagnosis present

## 2021-03-30 DIAGNOSIS — Z20822 Contact with and (suspected) exposure to covid-19: Secondary | ICD-10-CM | POA: Diagnosis present

## 2021-03-30 DIAGNOSIS — G062 Extradural and subdural abscess, unspecified: Secondary | ICD-10-CM

## 2021-03-30 DIAGNOSIS — N393 Stress incontinence (female) (male): Secondary | ICD-10-CM | POA: Diagnosis present

## 2021-03-30 DIAGNOSIS — Z833 Family history of diabetes mellitus: Secondary | ICD-10-CM

## 2021-03-30 DIAGNOSIS — M2578 Osteophyte, vertebrae: Secondary | ICD-10-CM

## 2021-03-30 DIAGNOSIS — E861 Hypovolemia: Secondary | ICD-10-CM | POA: Diagnosis present

## 2021-03-30 DIAGNOSIS — I25118 Atherosclerotic heart disease of native coronary artery with other forms of angina pectoris: Secondary | ICD-10-CM | POA: Diagnosis present

## 2021-03-30 DIAGNOSIS — M609 Myositis, unspecified: Secondary | ICD-10-CM | POA: Diagnosis present

## 2021-03-30 DIAGNOSIS — E782 Mixed hyperlipidemia: Secondary | ICD-10-CM | POA: Diagnosis present

## 2021-03-30 DIAGNOSIS — A4102 Sepsis due to Methicillin resistant Staphylococcus aureus: Principal | ICD-10-CM | POA: Diagnosis present

## 2021-03-30 DIAGNOSIS — Z87442 Personal history of urinary calculi: Secondary | ICD-10-CM

## 2021-03-30 DIAGNOSIS — J449 Chronic obstructive pulmonary disease, unspecified: Secondary | ICD-10-CM | POA: Diagnosis present

## 2021-03-30 DIAGNOSIS — F17210 Nicotine dependence, cigarettes, uncomplicated: Secondary | ICD-10-CM | POA: Diagnosis present

## 2021-03-30 DIAGNOSIS — B958 Unspecified staphylococcus as the cause of diseases classified elsewhere: Secondary | ICD-10-CM | POA: Diagnosis present

## 2021-03-30 DIAGNOSIS — K219 Gastro-esophageal reflux disease without esophagitis: Secondary | ICD-10-CM | POA: Diagnosis present

## 2021-03-30 DIAGNOSIS — K5900 Constipation, unspecified: Secondary | ICD-10-CM | POA: Diagnosis present

## 2021-03-30 DIAGNOSIS — Z6830 Body mass index (BMI) 30.0-30.9, adult: Secondary | ICD-10-CM

## 2021-03-30 DIAGNOSIS — M47816 Spondylosis without myelopathy or radiculopathy, lumbar region: Secondary | ICD-10-CM | POA: Diagnosis present

## 2021-03-30 DIAGNOSIS — R29898 Other symptoms and signs involving the musculoskeletal system: Secondary | ICD-10-CM

## 2021-03-30 DIAGNOSIS — I330 Acute and subacute infective endocarditis: Secondary | ICD-10-CM

## 2021-03-30 DIAGNOSIS — Z7984 Long term (current) use of oral hypoglycemic drugs: Secondary | ICD-10-CM

## 2021-03-30 DIAGNOSIS — R6510 Systemic inflammatory response syndrome (SIRS) of non-infectious origin without acute organ dysfunction: Secondary | ICD-10-CM | POA: Diagnosis present

## 2021-03-30 DIAGNOSIS — I6340 Cerebral infarction due to embolism of unspecified cerebral artery: Secondary | ICD-10-CM | POA: Insufficient documentation

## 2021-03-30 DIAGNOSIS — M009 Pyogenic arthritis, unspecified: Secondary | ICD-10-CM | POA: Diagnosis present

## 2021-03-30 DIAGNOSIS — N10 Acute tubulo-interstitial nephritis: Secondary | ICD-10-CM | POA: Diagnosis present

## 2021-03-30 DIAGNOSIS — I76 Septic arterial embolism: Secondary | ICD-10-CM

## 2021-03-30 DIAGNOSIS — E871 Hypo-osmolality and hyponatremia: Secondary | ICD-10-CM | POA: Diagnosis present

## 2021-03-30 DIAGNOSIS — R531 Weakness: Secondary | ICD-10-CM

## 2021-03-30 DIAGNOSIS — R509 Fever, unspecified: Secondary | ICD-10-CM

## 2021-03-30 DIAGNOSIS — M00061 Staphylococcal arthritis, right knee: Secondary | ICD-10-CM | POA: Diagnosis present

## 2021-03-30 DIAGNOSIS — E6609 Other obesity due to excess calories: Secondary | ICD-10-CM

## 2021-03-30 DIAGNOSIS — J4520 Mild intermittent asthma, uncomplicated: Secondary | ICD-10-CM | POA: Diagnosis present

## 2021-03-30 DIAGNOSIS — R824 Acetonuria: Secondary | ICD-10-CM | POA: Diagnosis present

## 2021-03-30 DIAGNOSIS — I10 Essential (primary) hypertension: Secondary | ICD-10-CM | POA: Diagnosis present

## 2021-03-30 DIAGNOSIS — B9562 Methicillin resistant Staphylococcus aureus infection as the cause of diseases classified elsewhere: Secondary | ICD-10-CM

## 2021-03-30 DIAGNOSIS — R809 Proteinuria, unspecified: Secondary | ICD-10-CM | POA: Diagnosis present

## 2021-03-30 DIAGNOSIS — R7881 Bacteremia: Secondary | ICD-10-CM

## 2021-03-30 DIAGNOSIS — M25561 Pain in right knee: Secondary | ICD-10-CM

## 2021-03-30 DIAGNOSIS — R21 Rash and other nonspecific skin eruption: Secondary | ICD-10-CM | POA: Diagnosis present

## 2021-03-30 DIAGNOSIS — M159 Polyosteoarthritis, unspecified: Secondary | ICD-10-CM | POA: Diagnosis present

## 2021-03-30 DIAGNOSIS — G541 Lumbosacral plexus disorders: Secondary | ICD-10-CM | POA: Diagnosis present

## 2021-03-30 DIAGNOSIS — Z8249 Family history of ischemic heart disease and other diseases of the circulatory system: Secondary | ICD-10-CM

## 2021-03-30 DIAGNOSIS — E1165 Type 2 diabetes mellitus with hyperglycemia: Secondary | ICD-10-CM | POA: Diagnosis present

## 2021-03-30 DIAGNOSIS — L409 Psoriasis, unspecified: Secondary | ICD-10-CM | POA: Diagnosis present

## 2021-03-30 DIAGNOSIS — Z7982 Long term (current) use of aspirin: Secondary | ICD-10-CM

## 2021-03-30 LAB — BASIC METABOLIC PANEL
Anion gap: 11 (ref 5–15)
BUN: 13 mg/dL (ref 8–23)
CO2: 22 mmol/L (ref 22–32)
Calcium: 8.8 mg/dL — ABNORMAL LOW (ref 8.9–10.3)
Chloride: 94 mmol/L — ABNORMAL LOW (ref 98–111)
Creatinine, Ser: 0.95 mg/dL (ref 0.44–1.00)
GFR, Estimated: >60 mL/min (ref >60)
Glucose, Bld: 281 mg/dL — ABNORMAL HIGH (ref 70–99)
Potassium: 3.7 mmol/L (ref 3.5–5.1)
Sodium: 127 mmol/L — ABNORMAL LOW (ref 135–145)

## 2021-03-30 LAB — CBC
HCT: 43.3 % (ref 36.0–46.0)
Hemoglobin: 14.6 g/dL (ref 12.0–15.0)
MCH: 29.3 pg (ref 26.0–34.0)
MCHC: 33.7 g/dL (ref 30.0–36.0)
MCV: 86.9 fL (ref 80.0–100.0)
Platelets: 195 10*3/uL (ref 150–400)
RBC: 4.98 MIL/uL (ref 3.87–5.11)
RDW: 13.2 % (ref 11.5–15.5)
WBC: 20.7 10*3/uL — ABNORMAL HIGH (ref 4.0–10.5)
nRBC: 0 % (ref 0.0–0.2)

## 2021-03-30 NOTE — ED Notes (Signed)
Received pt at this time per wheelchair.

## 2021-03-30 NOTE — ED Triage Notes (Signed)
Pt c/o general malaise for the past 4 days. Reports vomiting and fever as high as 105 at home, decreased PO intake.

## 2021-03-31 ENCOUNTER — Observation Stay (HOSPITAL_COMMUNITY): Payer: Medicare Other

## 2021-03-31 ENCOUNTER — Encounter (HOSPITAL_COMMUNITY): Payer: Self-pay | Admitting: Internal Medicine

## 2021-03-31 DIAGNOSIS — E1165 Type 2 diabetes mellitus with hyperglycemia: Secondary | ICD-10-CM | POA: Diagnosis present

## 2021-03-31 DIAGNOSIS — E871 Hypo-osmolality and hyponatremia: Secondary | ICD-10-CM | POA: Diagnosis present

## 2021-03-31 DIAGNOSIS — R6510 Systemic inflammatory response syndrome (SIRS) of non-infectious origin without acute organ dysfunction: Secondary | ICD-10-CM | POA: Diagnosis present

## 2021-03-31 DIAGNOSIS — I10 Essential (primary) hypertension: Secondary | ICD-10-CM | POA: Diagnosis not present

## 2021-03-31 LAB — CBG MONITORING, ED
Glucose-Capillary: 292 mg/dL — ABNORMAL HIGH (ref 70–99)
Glucose-Capillary: 276 mg/dL — ABNORMAL HIGH (ref 70–99)

## 2021-03-31 LAB — URINALYSIS, ROUTINE W REFLEX MICROSCOPIC
Bilirubin Urine: NEGATIVE
Glucose, UA: >=500 mg/dL — AB
Ketones, ur: 20 mg/dL — AB
Nitrite: NEGATIVE
Protein, ur: 100 mg/dL — AB
Specific Gravity, Urine: 1.009 (ref 1.005–1.030)
pH: 5 (ref 5.0–8.0)

## 2021-03-31 LAB — COMPREHENSIVE METABOLIC PANEL
ALT: 52 U/L — ABNORMAL HIGH (ref 0–44)
AST: 36 U/L (ref 15–41)
Albumin: 2.7 g/dL — ABNORMAL LOW (ref 3.5–5.0)
Alkaline Phosphatase: 72 U/L (ref 38–126)
Anion gap: 15 (ref 5–15)
BUN: 17 mg/dL (ref 8–23)
CO2: 17 mmol/L — ABNORMAL LOW (ref 22–32)
Calcium: 8.3 mg/dL — ABNORMAL LOW (ref 8.9–10.3)
Chloride: 93 mmol/L — ABNORMAL LOW (ref 98–111)
Creatinine, Ser: 0.95 mg/dL (ref 0.44–1.00)
GFR, Estimated: >60 mL/min (ref >60)
Glucose, Bld: 271 mg/dL — ABNORMAL HIGH (ref 70–99)
Potassium: 3.8 mmol/L (ref 3.5–5.1)
Sodium: 125 mmol/L — ABNORMAL LOW (ref 135–145)
Total Bilirubin: 0.8 mg/dL (ref 0.3–1.2)
Total Protein: 6.4 g/dL — ABNORMAL LOW (ref 6.5–8.1)

## 2021-03-31 LAB — CBC WITH DIFFERENTIAL/PLATELET
Abs Immature Granulocytes: 0.12 10*3/uL — ABNORMAL HIGH (ref 0.00–0.07)
Basophils Absolute: 0 10*3/uL (ref 0.0–0.1)
Basophils Relative: 0 %
Eosinophils Absolute: 0 10*3/uL (ref 0.0–0.5)
Eosinophils Relative: 0 %
HCT: 42.1 % (ref 36.0–46.0)
Hemoglobin: 14 g/dL (ref 12.0–15.0)
Immature Granulocytes: 1 %
Lymphocytes Relative: 4 %
Lymphs Abs: 0.9 10*3/uL (ref 0.7–4.0)
MCH: 29.2 pg (ref 26.0–34.0)
MCHC: 33.3 g/dL (ref 30.0–36.0)
MCV: 87.9 fL (ref 80.0–100.0)
Monocytes Absolute: 2.9 10*3/uL — ABNORMAL HIGH (ref 0.1–1.0)
Monocytes Relative: 13 %
Neutro Abs: 17.6 10*3/uL — ABNORMAL HIGH (ref 1.7–7.7)
Neutrophils Relative %: 82 %
Platelets: 214 10*3/uL (ref 150–400)
RBC: 4.79 MIL/uL (ref 3.87–5.11)
RDW: 13.4 % (ref 11.5–15.5)
WBC: 21.5 10*3/uL — ABNORMAL HIGH (ref 4.0–10.5)
nRBC: 0 % (ref 0.0–0.2)

## 2021-03-31 LAB — BASIC METABOLIC PANEL
Anion gap: 14 (ref 5–15)
BUN: 15 mg/dL (ref 8–23)
CO2: 21 mmol/L — ABNORMAL LOW (ref 22–32)
Calcium: 8.8 mg/dL — ABNORMAL LOW (ref 8.9–10.3)
Chloride: 92 mmol/L — ABNORMAL LOW (ref 98–111)
Creatinine, Ser: 0.95 mg/dL (ref 0.44–1.00)
GFR, Estimated: >60 mL/min (ref >60)
Glucose, Bld: 271 mg/dL — ABNORMAL HIGH (ref 70–99)
Potassium: 3.8 mmol/L (ref 3.5–5.1)
Sodium: 127 mmol/L — ABNORMAL LOW (ref 135–145)

## 2021-03-31 LAB — MAGNESIUM: Magnesium: 2.1 mg/dL (ref 1.7–2.4)

## 2021-03-31 LAB — TROPONIN I (HIGH SENSITIVITY)
Troponin I (High Sensitivity): 348 ng/L (ref <18)
Troponin I (High Sensitivity): 390 ng/L (ref <18)

## 2021-03-31 LAB — GLUCOSE, CAPILLARY
Glucose-Capillary: 267 mg/dL — ABNORMAL HIGH (ref 70–99)
Glucose-Capillary: 250 mg/dL — ABNORMAL HIGH (ref 70–99)

## 2021-03-31 LAB — OSMOLALITY, URINE: Osmolality, Ur: 384 mOsm/kg (ref 300–900)

## 2021-03-31 LAB — RESP PANEL BY RT-PCR (FLU A&B, COVID) ARPGX2
Influenza A by PCR: NEGATIVE
Influenza B by PCR: NEGATIVE
SARS Coronavirus 2 by RT PCR: NEGATIVE

## 2021-03-31 LAB — HIV ANTIBODY (ROUTINE TESTING W REFLEX): HIV Screen 4th Generation wRfx: NONREACTIVE

## 2021-03-31 LAB — OSMOLALITY: Osmolality: 282 mOsm/kg (ref 275–295)

## 2021-03-31 LAB — CK: Total CK: 244 U/L — ABNORMAL HIGH (ref 38–234)

## 2021-03-31 LAB — PROCALCITONIN: Procalcitonin: 2.97 ng/mL

## 2021-03-31 MED ORDER — ATORVASTATIN CALCIUM 80 MG PO TABS
80.0000 mg | ORAL_TABLET | Freq: Every day | ORAL | Status: DC
  Administered 2021-04-02: 80 mg via ORAL
  Filled 2021-03-31 (×3): qty 1
  Filled 2021-03-31: qty 2

## 2021-03-31 MED ORDER — METOPROLOL TARTRATE 25 MG PO TABS
25.0000 mg | ORAL_TABLET | Freq: Two times a day (BID) | ORAL | Status: DC
  Administered 2021-03-31 – 2021-04-07 (×15): 25 mg via ORAL
  Filled 2021-03-31 (×15): qty 1

## 2021-03-31 MED ORDER — SODIUM CHLORIDE 0.9 % IV SOLN
INTRAVENOUS | Status: AC
Start: 2021-03-31 — End: 2021-04-01

## 2021-03-31 MED ORDER — ONDANSETRON HCL 4 MG PO TABS: 4.0000 mg | ORAL_TABLET | Freq: Four times a day (QID) | ORAL | Status: DC | PRN

## 2021-03-31 MED ORDER — LACTATED RINGERS IV BOLUS
1000.0000 mL | Freq: Once | INTRAVENOUS | Status: AC
  Administered 2021-03-31: 1000 mL via INTRAVENOUS

## 2021-03-31 MED ORDER — INSULIN ASPART 100 UNIT/ML IJ SOLN
0.0000 [IU] | Freq: Three times a day (TID) | INTRAMUSCULAR | Status: DC
  Administered 2021-03-31 (×2): 8 [IU] via SUBCUTANEOUS
  Administered 2021-04-01: 3 [IU] via SUBCUTANEOUS
  Administered 2021-04-01: 11 [IU] via SUBCUTANEOUS
  Administered 2021-04-01: 8 [IU] via SUBCUTANEOUS
  Administered 2021-04-02: 5 [IU] via SUBCUTANEOUS
  Administered 2021-04-02: 3 [IU] via SUBCUTANEOUS
  Administered 2021-04-02: 5 [IU] via SUBCUTANEOUS
  Administered 2021-04-03: 3 [IU] via SUBCUTANEOUS
  Administered 2021-04-03: 5 [IU] via SUBCUTANEOUS
  Administered 2021-04-04: 3 [IU] via SUBCUTANEOUS
  Administered 2021-04-04 – 2021-04-06 (×5): 5 [IU] via SUBCUTANEOUS
  Administered 2021-04-06: 8 [IU] via SUBCUTANEOUS
  Administered 2021-04-07 (×2): 5 [IU] via SUBCUTANEOUS
  Administered 2021-04-07: 8 [IU] via SUBCUTANEOUS
  Administered 2021-04-08 (×2): 5 [IU] via SUBCUTANEOUS
  Administered 2021-04-08: 3 [IU] via SUBCUTANEOUS
  Administered 2021-04-09 (×2): 5 [IU] via SUBCUTANEOUS
  Administered 2021-04-09: 15 [IU] via SUBCUTANEOUS
  Administered 2021-04-10 (×2): 3 [IU] via SUBCUTANEOUS
  Administered 2021-04-10: 8 [IU] via SUBCUTANEOUS

## 2021-03-31 MED ORDER — SODIUM CHLORIDE 0.9 % IV SOLN
1.0000 g | INTRAVENOUS | Status: DC
  Administered 2021-04-01 – 2021-04-02 (×2): 1 g via INTRAVENOUS
  Filled 2021-03-31: qty 1
  Filled 2021-03-31: qty 10

## 2021-03-31 MED ORDER — ALBUTEROL SULFATE (2.5 MG/3ML) 0.083% IN NEBU: 2.5000 mg | INHALATION_SOLUTION | Freq: Four times a day (QID) | RESPIRATORY_TRACT | Status: DC | PRN

## 2021-03-31 MED ORDER — ASPIRIN 81 MG PO CHEW
81.0000 mg | CHEWABLE_TABLET | Freq: Every day | ORAL | Status: DC
  Administered 2021-03-31 – 2021-04-05 (×6): 81 mg via ORAL
  Filled 2021-03-31 (×6): qty 1

## 2021-03-31 MED ORDER — ACETAMINOPHEN 650 MG RE SUPP
650.0000 mg | Freq: Four times a day (QID) | RECTAL | Status: DC | PRN
Start: 2021-03-31 — End: 2021-03-31

## 2021-03-31 MED ORDER — AMLODIPINE BESYLATE 5 MG PO TABS
5.0000 mg | ORAL_TABLET | Freq: Every day | ORAL | Status: DC
  Administered 2021-03-31 – 2021-04-04 (×5): 5 mg via ORAL
  Filled 2021-03-31 (×5): qty 1

## 2021-03-31 MED ORDER — IPRATROPIUM-ALBUTEROL 0.5-2.5 (3) MG/3ML IN SOLN: 3.0000 mL | RESPIRATORY_TRACT | Status: DC | PRN

## 2021-03-31 MED ORDER — INSULIN ASPART 100 UNIT/ML IJ SOLN
0.0000 [IU] | Freq: Every day | INTRAMUSCULAR | Status: DC
  Administered 2021-03-31 – 2021-04-02 (×3): 2 [IU] via SUBCUTANEOUS
  Administered 2021-04-03: 3 [IU] via SUBCUTANEOUS
  Administered 2021-04-04: 2 [IU] via SUBCUTANEOUS
  Administered 2021-04-05: 5 [IU] via SUBCUTANEOUS
  Administered 2021-04-06 – 2021-04-07 (×2): 2 [IU] via SUBCUTANEOUS

## 2021-03-31 MED ORDER — ACETAMINOPHEN 325 MG PO TABS
650.0000 mg | ORAL_TABLET | Freq: Four times a day (QID) | ORAL | Status: DC | PRN
  Administered 2021-03-31 – 2021-04-04 (×8): 650 mg via ORAL
  Filled 2021-03-31 (×8): qty 2

## 2021-03-31 MED ORDER — ONDANSETRON HCL 4 MG/2ML IJ SOLN
4.0000 mg | Freq: Four times a day (QID) | INTRAMUSCULAR | Status: DC | PRN
  Administered 2021-03-31: 4 mg via INTRAVENOUS
  Filled 2021-03-31: qty 2

## 2021-03-31 MED ORDER — SODIUM CHLORIDE 0.9 % IV SOLN
1.0000 g | Freq: Once | INTRAVENOUS | Status: AC
  Administered 2021-03-31: 1 g via INTRAVENOUS
  Filled 2021-03-31: qty 10

## 2021-03-31 MED ORDER — DM-GUAIFENESIN ER 30-600 MG PO TB12: 1.0000 | ORAL_TABLET | Freq: Two times a day (BID) | ORAL | Status: DC | PRN

## 2021-03-31 MED ORDER — ADULT MULTIVITAMIN W/MINERALS CH
1.0000 | ORAL_TABLET | Freq: Every day | ORAL | Status: DC
  Administered 2021-03-31 – 2021-04-05 (×6): 1 via ORAL
  Filled 2021-03-31 (×6): qty 1

## 2021-03-31 MED ORDER — POTASSIUM CHLORIDE IN NACL 20-0.9 MEQ/L-% IV SOLN
INTRAVENOUS | Status: DC
  Filled 2021-03-31: qty 1000

## 2021-03-31 MED ORDER — NITROGLYCERIN 0.4 MG SL SUBL: 0.4000 mg | SUBLINGUAL_TABLET | SUBLINGUAL | Status: DC | PRN

## 2021-03-31 MED ORDER — LOSARTAN POTASSIUM 50 MG PO TABS
50.0000 mg | ORAL_TABLET | Freq: Every day | ORAL | Status: DC
  Administered 2021-03-31 – 2021-04-09 (×10): 50 mg via ORAL
  Filled 2021-03-31 (×10): qty 1

## 2021-03-31 MED ORDER — SENNOSIDES-DOCUSATE SODIUM 8.6-50 MG PO TABS
1.0000 | ORAL_TABLET | Freq: Every evening | ORAL | Status: DC | PRN
  Administered 2021-04-02 – 2021-04-04 (×2): 1 via ORAL
  Filled 2021-03-31 (×2): qty 1

## 2021-03-31 MED ORDER — HYDRALAZINE HCL 20 MG/ML IJ SOLN
10.0000 mg | INTRAMUSCULAR | Status: DC | PRN
  Administered 2021-04-05: 10 mg via INTRAVENOUS
  Filled 2021-03-31: qty 1

## 2021-03-31 MED ORDER — ENOXAPARIN SODIUM 40 MG/0.4ML IJ SOSY
40.0000 mg | PREFILLED_SYRINGE | INTRAMUSCULAR | Status: DC
  Administered 2021-03-31 – 2021-04-04 (×5): 40 mg via SUBCUTANEOUS
  Filled 2021-03-31 (×5): qty 0.4

## 2021-03-31 MED ORDER — ONDANSETRON HCL 4 MG/2ML IJ SOLN
INTRAMUSCULAR | Status: AC
  Administered 2021-03-31: 4 mg via INTRAVENOUS
  Filled 2021-03-31: qty 2

## 2021-03-31 MED ORDER — ACETAMINOPHEN 325 MG PO TABS
650.0000 mg | ORAL_TABLET | Freq: Four times a day (QID) | ORAL | Status: DC | PRN
Start: 2021-03-31 — End: 2021-03-31

## 2021-03-31 MED ORDER — OMEGA-3-ACID ETHYL ESTERS 1 G PO CAPS
1.0000 g | ORAL_CAPSULE | Freq: Every day | ORAL | Status: DC
  Administered 2021-04-01 – 2021-04-10 (×10): 1 g via ORAL
  Filled 2021-03-31 (×11): qty 1

## 2021-03-31 MED ORDER — METFORMIN HCL ER 500 MG PO TB24
500.0000 mg | ORAL_TABLET | Freq: Two times a day (BID) | ORAL | Status: DC
  Filled 2021-03-31: qty 1

## 2021-03-31 NOTE — ED Notes (Signed)
Patient ambulated to bedside commode with one person assist.

## 2021-03-31 NOTE — ED Notes (Signed)
Patient transported to CT 

## 2021-03-31 NOTE — ED Provider Notes (Signed)
[facility] EMERGENCY DEPARTMENT Provider Note   CSN: [id number] Arrival date & time: 03/30/21  1945     History Chief Complaint  Patient presents with   Weakness    Katie ABRAHA is a 65 y.o. female.  The history is provided by the patient and a relative.  Weakness Severity:  Moderate Onset quality:  Gradual Duration:  3 days Timing:  Constant Progression:  Worsening Chronicity:  New Relieved by:  Nothing Worsened by:  Activity Associated symptoms: arthralgias, cough, difficulty walking, fever, foul-smelling urine, myalgias, nausea and vomiting   Associated symptoms: no abdominal pain, no diarrhea, no dysuria and no shortness of breath   Patient with history of CAD, hypertension presents with fatigue.   Patient reports over the past 3 days has had increasing fatigue and weakness.  She is having difficulty walking due to fatigue.  She reports fever up to 105.  She has had vomiting and cough.  No active chest pain.  No significant shortness of breath She is  vaccinated for COVID  Patient reports body aches and joint pain.  Denies rash.  No recent tick bites. 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 Relevant Hx: Course: Daily  Update: Today's Plan:discussed with her that her sugars will increase and she has actively worked on her diet and diet and lost from 242 to 188 which I commend her on, she is to continue with her weight loss efforts despite taking this   Electronically signed by: Sandi Mealy    Patient Active Problem List   Diagnosis Date Noted   Coronary artery disease of native artery of native heart with stable angina pectoris (HCC)    Abnormal cardiac CT angiography    Elevated C-reactive protein (CRP) 01/15/2021   Intermittent asthma without complication 01/13/2021   Shortness of breath 01/13/2021   Cigarette smoker 07/17/2019   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   Class 1 obesity due to excess calories with serious comorbidity and body mass index (BMI) of 30.0 to 30.9 in adult 06/12/2019   Mixed hyperlipidemia 05/01/2019   Tobacco use disorder 12/02/2017   Type 2 diabetes mellitus without complication, without long-term current use of insulin (HCC) 01/30/2016   Encounter for long-term current use of high risk medication 01/29/2016   Essential hypertension 01/29/2016   Gastroesophageal reflux disease 01/29/2016   Osteoarthritis of multiple joints 01/29/2016    Past Surgical History:  Procedure Laterality Date   APPENDECTOMY     BUNIONECTOMY Right    LEFT HEART CATH AND CORONARY ANGIOGRAPHY N/A 03/12/2021   Procedure: LEFT HEART CATH AND CORONARY ANGIOGRAPHY;  Surgeon: Lennette Bihari, MD;  Location: [facility] INVASIVE CV LAB;  Service: Cardiovascular;  Laterality: N/A;   NECK SURGERY     SMALL INTESTINE SURGERY  TONSILLECTOMY       OB History   No obstetric history on file.     Family History  Problem Relation Age of Onset   Diabetes Mother    Heart attack Mother 42   Angina Father    Heart attack Father 90   Diabetes Paternal Grandmother     Social History   Tobacco Use   Smoking status: Every Day    Packs/day: 0.50    Types: Cigarettes   Smokeless tobacco: Never  Vaping Use    Vaping Use: Never used  Substance Use Topics   Alcohol use: Yes    Alcohol/week: 1.0 standard drink    Types: 1 Glasses of wine per week    Comment: Socially   Drug use: Never    Home Medications Prior to Admission medications   Medication Sig Start Date End Date Taking? Authorizing Provider  albuterol (VENTOLIN HFA) 108 (90 Base) MCG/ACT inhaler Inhale 2 puffs into the lungs every 6 (six) hours as needed for wheezing or shortness of breath. 12/02/17   [provider]  amLODipine (NORVASC) 5 MG tablet Take 1 tablet (5 mg total) by mouth daily. 03/12/21   Arty Baumgartner, NP  aspirin 81 MG chewable tablet Chew 81 mg by mouth daily.    [provider]  atorvastatin (LIPITOR) 80 MG tablet Take 1 tablet (80 mg total) by mouth daily. 03/12/21   Arty Baumgartner, NP  Cinnamon 500 MG capsule Take 500 mg by mouth daily.    [provider]  Chilton Si Tea 150 MG CAPS Take 150 mg by mouth daily.    [provider]  ibuprofen (ADVIL) 800 MG tablet Take 800 mg by mouth every 6 (six) hours as needed for pain. 01/13/21   [provider]  losartan (COZAAR) 50 MG tablet Take 50 mg by mouth at bedtime. 05/01/19   [provider]  metFORMIN (GLUCOPHAGE-XR) 500 MG 24 hr tablet Take 500 mg by mouth 2 (two) times daily. 03/14/19   [provider]  metoprolol tartrate (LOPRESSOR) 25 MG tablet Take 1 tablet (25 mg total) by mouth 2 (two) times daily. 03/06/21 06/04/21  Baldo Daub, MD  Multiple Vitamin (MULTIVITAMIN) capsule Take 1 capsule by mouth daily.    [provider]  nitroGLYCERIN (NITROSTAT) 0.4 MG SL tablet Place 1 tablet (0.4 mg total) under the tongue every 5 (five) minutes as needed. 03/06/21 06/04/21  Baldo Daub, MD  Omega-3 1000 MG CAPS Take 1,000 mg by mouth daily.    [provider]  pravastatin (PRAVACHOL) 20 MG tablet Take 1 tablet (20 mg total) by mouth every evening. 02/19/21 03/12/21  Baldo Daub, MD     Allergies    Patient has no known allergies.  Review of Systems   Review of Systems  Constitutional:  Positive for fatigue and fever.  Respiratory:  Positive for cough. Negative for shortness of breath.   Gastrointestinal:  Positive for nausea and vomiting. Negative for abdominal pain and diarrhea.  Genitourinary:  Negative for dysuria.  Musculoskeletal:  Positive for arthralgias and myalgias.  Skin:  Negative for rash.  Neurological:  Positive for weakness.  All other systems reviewed and are negative.  Physical Exam Updated Vital Signs BP 135/78   Pulse 82   Temp 98.4 F (36.9 C) (Oral)   Resp (!) 21   Ht 1.676 m (5\' 6" )   Wt 83.9 kg   LMP  (LMP Unknown)   SpO2 96%  BMI 29.85 kg/m   Physical Exam CONSTITUTIONAL: Elderly and fatigued HEAD: Normocephalic/atraumatic EYES: EOMI/PERRL ENMT: Mucous membranes moist NECK: supple no meningeal signs SPINE/BACK:entire spine nontender CV: S1/S2 noted, no murmurs/rubs/gallops noted LUNGS: Lungs are clear to auscultation bilaterally, no apparent distress ABDOMEN: soft, nontender, no rebound or guarding, bowel sounds noted throughout abdomen GU:no cva tenderness NEURO: Pt is awake/alert/appropriate, moves all extremitiesx4.  No facial droop.  No arm or leg drift.  She has an appropriate mental status and is interactive EXTREMITIES: pulses normal/equal, full ROM SKIN: warm, color normal PSYCH: no abnormalities of mood noted, alert and oriented to situation  ED Results / Procedures / Treatments   Labs (all labs ordered are listed, but only abnormal results are displayed) Labs Reviewed  BASIC METABOLIC PANEL - Abnormal; Notable for the following components:      Result Value   Sodium 127 (*)    Chloride 94 (*)    Glucose, Bld 281 (*)    Calcium 8.8 (*)    All other components within normal limits  CBC - Abnormal; Notable for the following components:   WBC 20.7 (*)    All other components within normal limits   URINALYSIS, ROUTINE W REFLEX MICROSCOPIC - Abnormal; Notable for the following components:   APPearance CLOUDY (*)    Glucose, UA >=500 (*)    Hgb urine dipstick SMALL (*)    Ketones, ur 20 (*)    Protein, ur 100 (*)    Leukocytes,Ua MODERATE (*)    Bacteria, UA FEW (*)    Non Squamous Epithelial 0-5 (*)    All other components within normal limits  CBG MONITORING, ED - Abnormal; Notable for the following components:   Glucose-Capillary 292 (*)    All other components within normal limits  RESP PANEL BY RT-PCR (FLU A&B, COVID) [id number]    EKG EKG Interpretation  Date/Time:  Sunday March 30 2021 20:22:39 EDT Ventricular Rate:  77 PR Interval:  162 QRS Duration: 82 QT Interval:  364 QTC Calculation: 411 R Axis:   68 Text Interpretation: Normal sinus rhythm Normal ECG Confirmed by Zadie RhineWickline, Jaydan Meidinger ([id number]) on 03/31/2021 12:10:31 AM  Radiology DG Chest 1 View  Result Date: 03/30/2021 CLINICAL DATA:  General malaise for 4 days.  Vomiting and fever. EXAM: CHEST  1 VIEW COMPARISON:  07/29/2005 FINDINGS: Normal heart size and pulmonary vascularity. No focal airspace disease or consolidation in the lungs. No blunting of costophrenic angles. No pneumothorax. Mediastinal contours appear intact. Postoperative changes in the cervical spine. IMPRESSION: No active disease. Electronically Signed   By: Burman NievesWilliam  Stevens M.D.   On: 03/30/2021 23:19    Procedures Procedures   Medications Ordered in ED Medications  cefTRIAXone (ROCEPHIN) 1 g in sodium chloride 0.9 % 100 mL IVPB (has no administration in time range)  lactated ringers bolus 1,000 mL (0 mLs Intravenous Stopped 03/31/21 0200)  lactated ringers bolus 1,000 mL (1,000 mLs Intravenous New Bag/Given 03/31/21 0235)    ED Course  I have reviewed the triage vital signs and the nursing notes.  Pertinent labs & imaging results that were available during my care of the patient were reviewed by me and considered in my medical decision making  (see chart for details).    MDM Rules/Calculators/A&P                           2:17 AM Patient presents with fatigue, vomiting and fevers for the past several days.  She  recently had cardiac cath earlier this month that revealed multivessel disease and recommended aggressive medical therapy. Initial COVID-19 screen is negative.  We are awaiting urinalysis. Patient has been given IV fluids 2:57 AM Urinalysis consistent with infection.  Given the history of myalgias, fever, vomiting, felt Urine, suspect this is UTI. 4:38 AM Patient still reporting fatigue.  Reports she just does not feel she is very safe walking because she feels generally weak  while at rest she has no focal weakness in the bed Will admit.  Discussed with Dr. Robb Matar for admission Final Clinical Impression(s) / ED Diagnoses Final diagnoses:  Weakness  Dehydration  Lower urinary tract infectious disease    Rx / DC Orders ED Discharge Orders     None        Zadie Rhine, MD 03/31/21 [phone]

## 2021-03-31 NOTE — H&P (Signed)
History and Physical    Katie Serrano KYH:[id number] DOB: 02/17/1956 DOA: 03/30/2021  PCP: Gordan Payment., MD  Patient coming from: Home.  I have personally briefly reviewed patient's old medical records in [facility]  Chief Complaint: fatigue and fever.  HPI: Katie Serrano is a 65 y.o. female with medical history significant of class I obesity, Eagle syndrome, elevated CRP, hypertension, GERD, asthma, tobacco use disorder, COPD, type II DM, hyperlipidemia, osteoarthritis of multiple joints who is coming to the emergency department with complaints of general malaise, decreased appetite, nausea, emesis and fever as high as 105 F.  Her daughter states that she is usually very active, but has been very weak and without energy for the past 4 days.  Her oral intake is decreased.  She is having headaches, myalgias and arthralgias.  No productive cough, rhinorrhea or sore throat.  However, the patient is having intermittent left flank pain radiated to her left lower back.  She stated she has a remote history of urolithiasis and has had urine tract infections in the past.  She has been having occasional angina with her last episode on Friday or Thursday.  No PND, orthopnea or pitting edema of the lower extremities.  No abdominal pain, diarrhea, melena or hematochezia.  No dysuria, frequency or hematuria.  No polyuria, polydipsia, polyphagia or blurred vision.  ED Course: Initial vital signs were temperature 98.4 F, pulse 81, respirations 16, BP 127/34 mmHg O2 sat 99% on room air.  Lab work: Her urinalysis was cloudy with glucosuria more than 500, ketonuria of 20 and proteinuria 100 mg/dL.  There was moderate leukocyte esterase with 21-50 WBC per hpf and a few bacteria.  CBC showed a white count of 20.7, hemoglobin 14.6 g/dL platelets 151.  Coronavirus and influenza PCR was negative.  Sodium was 127 and chloride 94 mmol/L.  All other electrolytes were normal when calcium is corrected to an  albumin of 2.7 mg/dL.  Imaging: A 1 view chest radiograph did not show any active cardiopulmonary pathology.  Review of Systems: As per HPI otherwise all other systems reviewed and are negative.  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 Relevant Hx: Course: Daily Update: Today's Plan:discussed with her that her sugars will increase and she has actively worked on her diet and diet and lost from 242 to 188 which I commend her on, she is to continue with her weight loss efforts despite taking this   Electronically signed by: Sandi Mealy    Past Surgical History:  Procedure Laterality Date   APPENDECTOMY     BUNIONECTOMY Right    LEFT HEART CATH AND CORONARY ANGIOGRAPHY N/A 03/12/2021   Procedure: LEFT HEART CATH AND CORONARY ANGIOGRAPHY;  Surgeon: Lennette Bihari, MD;  Location: [facility] INVASIVE CV LAB;  Service: Cardiovascular;  Laterality: N/A;   NECK SURGERY     SMALL INTESTINE SURGERY     TONSILLECTOMY      Social History  reports that she has been smoking cigarettes. She has been smoking an average of .5 packs per day. She has never used smokeless tobacco. She reports current alcohol use of about 1.0 standard drink of alcohol per week. She reports  that she does not use drugs.  No Known Allergies  Family History  Problem Relation Age of Onset   Diabetes Mother    Heart attack Mother 2574   Angina Father    Heart attack Father 5165   Diabetes Paternal Grandmother    Prior to Admission medications   Medication Sig Start Date End Date Taking? Authorizing Provider  albuterol (VENTOLIN HFA) 108 (90 Base) MCG/ACT inhaler Inhale 2 puffs into the lungs every 6 (six) hours as needed for wheezing or shortness of breath. 12/02/17   [provider]  amLODipine (NORVASC) 5 MG tablet Take 1 tablet (5 mg total) by mouth daily. 03/12/21   Arty Baumgartneroberts, Lindsay B, NP  aspirin 81 MG chewable tablet Chew 81 mg by mouth daily.    [provider]  atorvastatin (LIPITOR) 80 MG tablet Take 1 tablet (80 mg total) by mouth daily. 03/12/21   Arty Baumgartneroberts, Lindsay B, NP  Cinnamon 500 MG capsule Take 500 mg by mouth daily.    [provider]  Chilton SiGreen Tea 150 MG CAPS Take 150 mg by mouth daily.    [provider]  ibuprofen (ADVIL) 800 MG tablet Take 800 mg by mouth every 6 (six) hours as needed for pain. 01/13/21   [provider]  losartan (COZAAR) 50 MG tablet Take 50 mg by mouth at bedtime. 05/01/19   [provider]  metFORMIN (GLUCOPHAGE-XR) 500 MG 24 hr tablet Take 500 mg by mouth 2 (two) times daily. 03/14/19   [provider]  metoprolol tartrate (LOPRESSOR) 25 MG tablet Take 1 tablet (25 mg total) by mouth 2 (two) times daily. 03/06/21 06/04/21  Baldo DaubMunley, Brian J, MD  Multiple Vitamin (MULTIVITAMIN) capsule Take 1 capsule by mouth daily.    [provider]  nitroGLYCERIN (NITROSTAT) 0.4 MG SL tablet Place 1 tablet (0.4 mg total) under the tongue every 5 (five) minutes as needed. 03/06/21 06/04/21  Baldo DaubMunley, Brian J, MD  Omega-3 1000 MG CAPS Take 1,000 mg by mouth daily.    [provider]  pravastatin (PRAVACHOL) 20 MG tablet Take 1 tablet (20 mg total) by mouth every evening. 02/19/21 03/12/21  Baldo DaubMunley, Brian  J, MD    Physical Exam: Vitals:   03/31/21 0145 03/31/21 0200 03/31/21 0300 03/31/21 0400  BP: (!) 141/76 135/78 132/68 (!) 154/66  Pulse: 86 82 85 85  Resp: 20 (!) 21 (!) 24 20  Temp:      TempSrc:      SpO2: 93% 96% 96% 93%  Weight:      Height:        Constitutional: Acutely ill-appearing.  Currently in NAD. Eyes: PERRL, lids and conjunctivae normal.  Icteric sclera. ENMT: Mucous membranes are moist, lips look dry. Posterior pharynx clear of any exudate or lesions. Neck: normal, supple, no masses, no thyromegaly Respiratory: clear to auscultation bilaterally, no wheezing, no crackles. Normal respiratory effort. No accessory muscle use.  Cardiovascular: Regular rate and rhythm, no murmurs / rubs / gallops. No extremity edema. 2+ pedal pulses. No carotid bruits.  Abdomen: Obese, no distention.  Bowel sounds positive.  Soft, no tenderness, no masses palpated.  No CVA tenderness on percussion.  No hepatosplenomegaly.  Musculoskeletal: no clubbing / cyanosis. Good ROM, no contractures. Normal muscle tone.  Skin: no rashes, lesions, ulcers on very limited dermatological examination. Neurologic: CN 2-12 grossly intact. Sensation intact, DTR normal. Strength 5/5 in all 4.  Psychiatric: Normal judgment and insight. Alert and oriented x 3. Normal mood.  Labs on Admission: I have personally reviewed following labs and imaging studies  CBC: Recent Labs  Lab 03/30/21 2021  WBC 20.7*  HGB 14.6  HCT 43.3  MCV 86.9  PLT 195    Basic Metabolic Panel: Recent Labs  Lab 03/30/21 2021  NA 127*  K 3.7  CL 94*  CO2 22  GLUCOSE 281*  BUN 13  CREATININE 0.95  CALCIUM 8.8*    GFR: Estimated Creatinine Clearance: 64.4 mL/min (by C-G formula based on SCr of 0.95 mg/dL).  Liver Function Tests: No results for input(s): AST, ALT, ALKPHOS, BILITOT, PROT, ALBUMIN in the last 168 hours.  Urine analysis:    Component Value Date/Time   COLORURINE YELLOW 03/31/2021 0239   APPEARANCEUR  CLOUDY (A) 03/31/2021 0239   LABSPEC 1.009 03/31/2021 0239   PHURINE 5.0 03/31/2021 0239   GLUCOSEU >=500 (A) 03/31/2021 0239   HGBUR SMALL (A) 03/31/2021 0239   BILIRUBINUR NEGATIVE 03/31/2021 0239   KETONESUR 20 (A) 03/31/2021 0239   PROTEINUR 100 (A) 03/31/2021 0239   NITRITE NEGATIVE 03/31/2021 0239   LEUKOCYTESUR MODERATE (A) 03/31/2021 0239    Radiological Exams on Admission: DG Chest 1 View  Result Date: 03/30/2021 CLINICAL DATA:  General malaise for 4 days.  Vomiting and fever. EXAM: CHEST  1 VIEW COMPARISON:  07/29/2005 FINDINGS: Normal heart size and pulmonary vascularity. No focal airspace disease or consolidation in the lungs. No blunting of costophrenic angles. No pneumothorax. Mediastinal contours appear intact. Postoperative changes in the cervical spine. IMPRESSION: No active disease. Electronically Signed   By: Burman Nieves M.D.   On: 03/30/2021 23:19    03/12/2021 LEFT HEART CATH AND CORONARY ANGIOGRAPHY  Conclusion    Prox RCA lesion is 100% stenosed. Prox LAD lesion is 45% stenosed. 2nd Diag lesion is 50% stenosed. Mid LAD lesion is 50% stenosed. Dist LAD lesion is 50% stenosed. Prox Cx lesion is 60% stenosed. 2nd Mrg-1 lesion is 50% stenosed. 2nd Mrg-2 lesion is 60% stenosed. Mid Cx to Dist Cx lesion is 20% stenosed. Dist RCA lesion is 50% stenosed. The left ventricular ejection fraction is greater than 65% by visual estimate. The left ventricular systolic function is normal. LV end diastolic pressure is mildly elevated. Dist Cx lesion is 30% stenosed.   Multivessel CAD with smooth 45% proximal LAD stenosis, 50% mid and mid distal LAD stenoses with 50% mid first diagonal stenosis; 50 to 60% eccentric proximal circumflex stenoses with 50 and 60% stenoses in a small caliber OM vessel with 20 and 30% distal circumflex stenoses and total proximal to mid RCA occlusion with extensive left-to-right collateralization filling the RCA up to the point of total  occlusion.   Dynamic LV function with EF estimated least 65 to 70%.  There were no focal segmental wall motion abnormalities.  LVEDP 19 mmHg.   RECOMMENDATION: Medical therapy trial.  The patient was hypertensive on presentation.  I have elected to add amlodipine 5 mg to her medical regimen which would be helpful both for hypertension and anti-ischemic benefit.  With her high vessel coronary plaque recommend aggressive high potency statin therapy and have suggested  discontinuance of low-dose pravastatin and change to atorvastatin 80 mg.  Consider outpatient titration of metoprolol blood pressure and heart rate allow.  The patient will be discharged today with plans for early follow-up with Dr. Norman Herrlich.  EKG: Independently reviewed. Vent. rate 77 BPM PR interval 162 ms QRS duration 82 ms QT/QTcB 364/411 ms P-R-T axes 77 68 46 Normal sinus rhythm Normal ECG  Assessment/Plan Principal Problem:   SIRS (systemic inflammatory response syndrome) (HCC) No clear etiology, but suspicious UA. Mild flank pain radiated to lower left-sided back. Observation/telemetry. Continue IV fluids. Antiemetics as needed. Continue ceftriaxone 1 g IVPB every 24 hr. Follow-up urine culture and sensitivity. Check CT renal study to rule out urolithiasis  Active Problems:   Hyponatremia Decreased oral intake? Continue normal saline infusion. Free water restriction. Check serum and urine osmolality. Follow-up sodium level.    Class 1 obesity  Needs lifestyle modifications. Follow-up with PCP.    Essential hypertension Continue amlodipine 5 mg p.o. daily. Continue losartan 50 mg p.o. bedtime. Continue metoprolol 25 mg p.o. twice daily. Monitor BP, HR, renal function electrolytes.    Mixed hyperlipidemia On atorvastatin 80 mg p.o. daily.    Coronary artery disease  Has been having episodes of chest pain. Continue aspirin, amlodipine, atorvastatin and metoprolol.    Type 2 diabetes mellitus  with hyperglycemia (HCC) Carbohydrate modified diet. Continue metformin 500 mg p.o. twice daily. CBG monitoring with RI SS.   DVT prophylaxis: Lovenox SQ. Code Status:   Full code. Family Communication:   Disposition Plan:   Patient is from:  Home.  Anticipated DC to:  Home.  Anticipated DC date:  04/01/2021.  Anticipated DC barriers: Clinical status.  Consults called:   Admission status:  Observation/telemetry.  Severity of Illness:  High severity after presenting with several days of fever, fatigue, malaise, decreased oral intake, nausea and vomiting.   Bobette Mo MD [facility]  How to contact the [facility] Attending or Consulting provider 7A - 7P or covering provider during after hours 7P -7A, for this patient?   Check the care team in [facility] and look for a) attending/consulting [facility] provider listed and b) the [facility] team listed Log into [email or web address] and use [place]'s universal password to access. If you do not have the password, please contact the hospital operator. Locate the [facility] provider you are looking for under [facility] and page to a number that you can be directly reached. If you still have difficulty reaching the provider, please page the [facility] (Director on Call) for the Hospitalists listed on amion for assistance.  03/31/2021, 4:46 AM   This document was prepared using Dragon voice recognition software and may contain some unintended transcription errors.

## 2021-03-31 NOTE — Progress Notes (Signed)
PROGRESS NOTE    Katie Serrano  JJO:[id number] DOB: May 26, 1956 DOA: 03/30/2021 PCP: Gordan Payment., MD   Brief Narrative:  65 year old with history of class I obesity, Eagle syndrome, elevated CRP, HTN, GERD, asthma, tobacco use, COPD, DM2, HLD, osteoarthritis came to the ER with complaints of generalized malaise and fever of 105 degrees.  During this time patient had poor oral intake.  Also reporting of left-sided flank pain.  Upon admission noted to have UTI, hyponatremia.   Assessment & Plan:   Principal Problem:   SIRS (systemic inflammatory response syndrome) (HCC) Active Problems:   Class 1 obesity due to excess calories with serious comorbidity and body mass index (BMI) of 30.0 to 30.9 in adult   Essential hypertension   Mixed hyperlipidemia   Coronary artery disease of native artery of native heart with stable angina pectoris (HCC)   Type 2 diabetes mellitus with hyperglycemia (HCC)   Hyponatremia  Urinary tract infection and hematuria with left-sided flank pain -Follow-up urine culture.  Empiric IV Rocephin - CT abdomen pelvis renal study-b/l Pyelonephritis    Hyponatremia, suspect hypovolemia -Check urine studies.  Hydration - Periodic BMP   Essential hypertension - Continue Norvasc 5 mg daily, losartan 50 mg bedtime, metoprolol 25 mg twice daily.  IV hydralazine as needed     Mixed hyperlipidemia -Lipitor 80 mg daily     Coronary artery disease -Currently chest pain-free.  Continue home meds     Type 2 diabetes mellitus with hyperglycemia (HCC) -Diabetic diet.  Hold metformin.  Insulin sliding scale and Accu-Cheks     Class 1 obesity Needs lifestyle modifications. Follow-up with PCP.    DVT prophylaxis: Lovenox Code Status: Full code Family Communication:  Called Husband, no answer. Called Delice Bison - she is updated.    Dispo: The patient is from: Home              Anticipated d/c is to: Home              Patient currently is not medically stable to  d/c. Currently needs IV Abx.    Difficult to place patient Yes    Subjective: Still has some bodyaches, and abd pain. No other complaints.   Review of Systems Otherwise negative except as per HPI, including: General: Denies fever, chills, night sweats or unintended weight loss. Resp: Denies cough, wheezing, shortness of breath. Cardiac: Denies chest pain, palpitations, orthopnea, paroxysmal nocturnal dyspnea. GI: Denies abdominal pain, nausea, vomiting, diarrhea or constipation GU: Denies dysuria, frequency, hesitancy or incontinence MS: Denies muscle aches, joint pain or swelling Neuro: Denies headache, neurologic deficits (focal weakness, numbness, tingling), abnormal gait Psych: Denies anxiety, depression, SI/HI/AVH Skin: Denies new rashes or lesions ID: Denies sick contacts, exotic exposures, travel  Examination:  General exam: Appears calm and comfortable  Respiratory system: Clear to auscultation. Respiratory effort normal. Cardiovascular system: S1 & S2 heard, RRR. No JVD, murmurs, rubs, gallops or clicks. No pedal edema. Gastrointestinal system: Abdomen is nondistended, soft and nontender. No organomegaly or masses felt. Normal bowel sounds heard. Central nervous system: Alert and oriented. No focal neurological deficits. Extremities: Symmetric 5 x 5 power. Skin: No rashes, lesions or ulcers Psychiatry: Judgement and insight appear normal. Mood & affect appropriate.     Objective: Vitals:   03/31/21 0400 03/31/21 0600 03/31/21 0700 03/31/21 0745  BP: (!) 154/66 (!) 162/78 (!) 154/78 (!) 154/78  Pulse: 85 (!) 111 97 96  Resp: 20 (!) 26 (!) 23 20  Temp:  TempSrc:      SpO2: 93% 98% 92% 95%  Weight:      Height:       No intake or output data in the 24 hours ending 03/31/21 0858 Filed Weights   03/30/21 2345  Weight: 83.9 kg    Data Reviewed:   CBC: Recent Labs  Lab 03/30/21 2021 03/31/21 0442  WBC 20.7* 21.5*  NEUTROABS  --  17.6*  HGB 14.6 14.0   HCT 43.3 42.1  MCV 86.9 87.9  PLT 195 214   Basic Metabolic Panel: Recent Labs  Lab 03/30/21 2021 03/31/21 0442  NA 127* 125*  K 3.7 3.8  CL 94* 93*  CO2 22 17*  GLUCOSE 281* 271*  BUN 13 17  CREATININE 0.95 0.95  CALCIUM 8.8* 8.3*  MG  --  2.1   GFR: Estimated Creatinine Clearance: 64.4 mL/min (by C-G formula based on SCr of 0.95 mg/dL). Liver Function Tests: Recent Labs  Lab 03/31/21 0442  AST 36  ALT 52*  ALKPHOS 72  BILITOT 0.8  PROT 6.4*  ALBUMIN 2.7*   No results for input(s): LIPASE, AMYLASE in the last 168 hours. No results for input(s): AMMONIA in the last 168 hours. Coagulation Profile: No results for input(s): INR, PROTIME in the last 168 hours. Cardiac Enzymes: Recent Labs  Lab 03/31/21 0442  CKTOTAL 244*   BNP (last 3 results) No results for input(s): PROBNP in the last 8760 hours. HbA1C: No results for input(s): HGBA1C in the last 72 hours. CBG: Recent Labs  Lab 03/31/21 0235  GLUCAP 292*   Lipid Profile: No results for input(s): CHOL, HDL, LDLCALC, TRIG, CHOLHDL, LDLDIRECT in the last 72 hours. Thyroid Function Tests: No results for input(s): TSH, T4TOTAL, FREET4, T3FREE, THYROIDAB in the last 72 hours. Anemia Panel: No results for input(s): VITAMINB12, FOLATE, FERRITIN, TIBC, IRON, RETICCTPCT in the last 72 hours. Sepsis Labs: Recent Labs  Lab 03/31/21 0442  PROCALCITON 2.97    Recent Results (from the past 240 hour(s))  Resp Panel by RT-PCR (Flu A&B, Covid) Nasopharyngeal Swab     Status: None   Collection Time: 03/31/21 12:21 AM   Specimen: Nasopharyngeal Swab; Nasopharyngeal(NP) swabs in vial transport medium  Result Value Ref Range Status   SARS Coronavirus 2 by RT PCR NEGATIVE NEGATIVE Final    Comment: (NOTE) SARS-CoV-2 target nucleic acids are NOT DETECTED.  The SARS-CoV-2 RNA is generally detectable in upper respiratory specimens during the acute phase of infection. The lowest concentration of SARS-CoV-2 viral  copies this assay can detect is 138 copies/mL. A negative result does not preclude SARS-Cov-2 infection and should not be used as the sole basis for treatment or other patient management decisions. A negative result may occur with  improper specimen collection/handling, submission of specimen other than nasopharyngeal swab, presence of viral mutation(s) within the areas targeted by this assay, and inadequate number of viral copies(<138 copies/mL). A negative result must be combined with clinical observations, patient history, and epidemiological information. The expected result is Negative.  Fact Sheet for Patients:  BloggerCourse.com  Fact Sheet for Healthcare Providers:  SeriousBroker.it  This test is no t yet approved or cleared by the [place] FDA and  has been authorized for detection and/or diagnosis of SARS-CoV-2 by FDA under an Emergency Use Authorization (EUA). This EUA will remain  in effect (meaning this test can be used) for the duration of the COVID-19 declaration under Section 564(b)(1) of the Act, 21 U.S.C.section 360bbb-3(b)(1), unless the authorization is terminated  or revoked sooner.       Influenza A by PCR NEGATIVE NEGATIVE Final   Influenza B by PCR NEGATIVE NEGATIVE Final    Comment: (NOTE) The Xpert Xpress SARS-CoV-2/FLU/RSV plus assay is intended as an aid in the diagnosis of influenza from Nasopharyngeal swab specimens and should not be used as a sole basis for treatment. Nasal washings and aspirates are unacceptable for Xpert Xpress SARS-CoV-2/FLU/RSV testing.  Fact Sheet for Patients: BloggerCourse.com  Fact Sheet for Healthcare Providers: SeriousBroker.it  This test is not yet approved or cleared by the [place] FDA and has been authorized for detection and/or diagnosis of SARS-CoV-2 by FDA under an Emergency Use Authorization (EUA). This  EUA will remain in effect (meaning this test can be used) for the duration of the COVID-19 declaration under Section 564(b)(1) of the Act, 21 U.S.C. section 360bbb-3(b)(1), unless the authorization is terminated or revoked.  Performed at [facility], [address] [place], [place] [address]          Radiology Studies: DG Chest 1 View  Result Date: 03/30/2021 CLINICAL DATA:  General malaise for 4 days.  Vomiting and fever. EXAM: CHEST  1 VIEW COMPARISON:  07/29/2005 FINDINGS: Normal heart size and pulmonary vascularity. No focal airspace disease or consolidation in the lungs. No blunting of costophrenic angles. No pneumothorax. Mediastinal contours appear intact. Postoperative changes in the cervical spine. IMPRESSION: No active disease. Electronically Signed   By: Burman Nieves M.D.   On: 03/30/2021 23:19        Scheduled Meds:  amLODipine  5 mg Oral Daily   aspirin  81 mg Oral Daily   atorvastatin  80 mg Oral Daily   enoxaparin (LOVENOX) injection  40 mg Subcutaneous Q24H   losartan  50 mg Oral QHS   metFORMIN  500 mg Oral BID WC   metoprolol tartrate  25 mg Oral BID   multivitamin with minerals  1 tablet Oral Daily   [START ON 04/01/2021] omega-3 acid ethyl esters  1 g Oral Daily   sodium chloride flush  3 mL Intravenous Q12H   Continuous Infusions:  0.9 % NaCl with KCl 20 mEq / L 88 mL/hr at 03/31/21 0524   [START ON 04/01/2021] cefTRIAXone (ROCEPHIN)  IV       LOS: 0 days   Time spent= 35 mins    Melaina Howerton Joline Maxcy, MD [facility]  If 7PM-7AM, please contact night-coverage  03/31/2021, 8:58 AM

## 2021-03-31 NOTE — Progress Notes (Signed)
Received patient from ED awake,alert/orientedx4 and able to verbalize needs. VSS; NAD noted; respirations easy/ even on room air. Movement/sensation noted to extremities. Tele monitor on. Oriented to room and floor. Education provided on when/ howe to notify RN. All safety measures in place and personal belongings within reach.

## 2021-04-01 DIAGNOSIS — Z20822 Contact with and (suspected) exposure to covid-19: Secondary | ICD-10-CM | POA: Diagnosis not present

## 2021-04-01 DIAGNOSIS — Z6830 Body mass index (BMI) 30.0-30.9, adult: Secondary | ICD-10-CM | POA: Diagnosis not present

## 2021-04-01 DIAGNOSIS — N10 Acute tubulo-interstitial nephritis: Secondary | ICD-10-CM | POA: Diagnosis present

## 2021-04-01 DIAGNOSIS — G8918 Other acute postprocedural pain: Secondary | ICD-10-CM | POA: Diagnosis not present

## 2021-04-01 DIAGNOSIS — R6510 Systemic inflammatory response syndrome (SIRS) of non-infectious origin without acute organ dysfunction: Secondary | ICD-10-CM | POA: Diagnosis not present

## 2021-04-01 DIAGNOSIS — R531 Weakness: Secondary | ICD-10-CM | POA: Diagnosis not present

## 2021-04-01 DIAGNOSIS — M6088 Other myositis, other site: Secondary | ICD-10-CM | POA: Diagnosis present

## 2021-04-01 DIAGNOSIS — M4646 Discitis, unspecified, lumbar region: Secondary | ICD-10-CM | POA: Diagnosis present

## 2021-04-01 DIAGNOSIS — Z7982 Long term (current) use of aspirin: Secondary | ICD-10-CM | POA: Diagnosis not present

## 2021-04-01 DIAGNOSIS — J449 Chronic obstructive pulmonary disease, unspecified: Secondary | ICD-10-CM | POA: Diagnosis not present

## 2021-04-01 DIAGNOSIS — Z833 Family history of diabetes mellitus: Secondary | ICD-10-CM | POA: Diagnosis not present

## 2021-04-01 DIAGNOSIS — E1165 Type 2 diabetes mellitus with hyperglycemia: Secondary | ICD-10-CM | POA: Diagnosis not present

## 2021-04-01 DIAGNOSIS — I6340 Cerebral infarction due to embolism of unspecified cerebral artery: Secondary | ICD-10-CM | POA: Diagnosis not present

## 2021-04-01 DIAGNOSIS — E782 Mixed hyperlipidemia: Secondary | ICD-10-CM | POA: Diagnosis present

## 2021-04-01 DIAGNOSIS — B9562 Methicillin resistant Staphylococcus aureus infection as the cause of diseases classified elsewhere: Secondary | ICD-10-CM | POA: Diagnosis present

## 2021-04-01 DIAGNOSIS — E871 Hypo-osmolality and hyponatremia: Secondary | ICD-10-CM | POA: Diagnosis present

## 2021-04-01 DIAGNOSIS — Z79899 Other long term (current) drug therapy: Secondary | ICD-10-CM | POA: Diagnosis not present

## 2021-04-01 DIAGNOSIS — J4520 Mild intermittent asthma, uncomplicated: Secondary | ICD-10-CM | POA: Diagnosis present

## 2021-04-01 DIAGNOSIS — M6009 Infective myositis, multiple sites: Secondary | ICD-10-CM | POA: Diagnosis not present

## 2021-04-01 DIAGNOSIS — I639 Cerebral infarction, unspecified: Secondary | ICD-10-CM | POA: Diagnosis not present

## 2021-04-01 DIAGNOSIS — F17210 Nicotine dependence, cigarettes, uncomplicated: Secondary | ICD-10-CM | POA: Diagnosis present

## 2021-04-01 DIAGNOSIS — R7881 Bacteremia: Secondary | ICD-10-CM | POA: Diagnosis present

## 2021-04-01 DIAGNOSIS — A4102 Sepsis due to Methicillin resistant Staphylococcus aureus: Secondary | ICD-10-CM | POA: Diagnosis not present

## 2021-04-01 DIAGNOSIS — R5381 Other malaise: Secondary | ICD-10-CM | POA: Diagnosis present

## 2021-04-01 DIAGNOSIS — R0981 Nasal congestion: Secondary | ICD-10-CM | POA: Diagnosis present

## 2021-04-01 DIAGNOSIS — I76 Septic arterial embolism: Secondary | ICD-10-CM | POA: Diagnosis not present

## 2021-04-01 DIAGNOSIS — E861 Hypovolemia: Secondary | ICD-10-CM | POA: Diagnosis present

## 2021-04-01 DIAGNOSIS — E6609 Other obesity due to excess calories: Secondary | ICD-10-CM | POA: Diagnosis not present

## 2021-04-01 DIAGNOSIS — G062 Extradural and subdural abscess, unspecified: Secondary | ICD-10-CM | POA: Diagnosis not present

## 2021-04-01 DIAGNOSIS — R21 Rash and other nonspecific skin eruption: Secondary | ICD-10-CM | POA: Diagnosis not present

## 2021-04-01 DIAGNOSIS — I6310 Cerebral infarction due to embolism of unspecified precerebral artery: Secondary | ICD-10-CM | POA: Diagnosis not present

## 2021-04-01 DIAGNOSIS — B958 Unspecified staphylococcus as the cause of diseases classified elsewhere: Secondary | ICD-10-CM | POA: Diagnosis present

## 2021-04-01 DIAGNOSIS — M159 Polyosteoarthritis, unspecified: Secondary | ICD-10-CM | POA: Diagnosis present

## 2021-04-01 DIAGNOSIS — M009 Pyogenic arthritis, unspecified: Secondary | ICD-10-CM | POA: Diagnosis present

## 2021-04-01 DIAGNOSIS — K5901 Slow transit constipation: Secondary | ICD-10-CM | POA: Diagnosis not present

## 2021-04-01 DIAGNOSIS — I330 Acute and subacute infective endocarditis: Secondary | ICD-10-CM | POA: Diagnosis not present

## 2021-04-01 DIAGNOSIS — L0292 Furuncle, unspecified: Secondary | ICD-10-CM | POA: Diagnosis not present

## 2021-04-01 DIAGNOSIS — Z8249 Family history of ischemic heart disease and other diseases of the circulatory system: Secondary | ICD-10-CM | POA: Diagnosis not present

## 2021-04-01 DIAGNOSIS — I2510 Atherosclerotic heart disease of native coronary artery without angina pectoris: Secondary | ICD-10-CM | POA: Diagnosis present

## 2021-04-01 DIAGNOSIS — Z7984 Long term (current) use of oral hypoglycemic drugs: Secondary | ICD-10-CM | POA: Diagnosis not present

## 2021-04-01 DIAGNOSIS — K5900 Constipation, unspecified: Secondary | ICD-10-CM | POA: Diagnosis present

## 2021-04-01 DIAGNOSIS — E119 Type 2 diabetes mellitus without complications: Secondary | ICD-10-CM | POA: Diagnosis not present

## 2021-04-01 DIAGNOSIS — R509 Fever, unspecified: Secondary | ICD-10-CM | POA: Diagnosis present

## 2021-04-01 DIAGNOSIS — I340 Nonrheumatic mitral (valve) insufficiency: Secondary | ICD-10-CM | POA: Diagnosis not present

## 2021-04-01 DIAGNOSIS — I10 Essential (primary) hypertension: Secondary | ICD-10-CM | POA: Diagnosis not present

## 2021-04-01 DIAGNOSIS — D62 Acute posthemorrhagic anemia: Secondary | ICD-10-CM | POA: Diagnosis present

## 2021-04-01 DIAGNOSIS — I25118 Atherosclerotic heart disease of native coronary artery with other forms of angina pectoris: Secondary | ICD-10-CM | POA: Diagnosis present

## 2021-04-01 DIAGNOSIS — M25561 Pain in right knee: Secondary | ICD-10-CM | POA: Diagnosis not present

## 2021-04-01 DIAGNOSIS — M00061 Staphylococcal arthritis, right knee: Secondary | ICD-10-CM | POA: Diagnosis present

## 2021-04-01 DIAGNOSIS — M47816 Spondylosis without myelopathy or radiculopathy, lumbar region: Secondary | ICD-10-CM | POA: Diagnosis present

## 2021-04-01 LAB — BASIC METABOLIC PANEL
Anion gap: 11 (ref 5–15)
BUN: 14 mg/dL (ref 8–23)
CO2: 25 mmol/L (ref 22–32)
Calcium: 8.6 mg/dL — ABNORMAL LOW (ref 8.9–10.3)
Chloride: 96 mmol/L — ABNORMAL LOW (ref 98–111)
Creatinine, Ser: 0.87 mg/dL (ref 0.44–1.00)
GFR, Estimated: >60 mL/min (ref >60)
Glucose, Bld: 247 mg/dL — ABNORMAL HIGH (ref 70–99)
Potassium: 3.2 mmol/L — ABNORMAL LOW (ref 3.5–5.1)
Sodium: 132 mmol/L — ABNORMAL LOW (ref 135–145)

## 2021-04-01 LAB — GLUCOSE, CAPILLARY
Glucose-Capillary: 263 mg/dL — ABNORMAL HIGH (ref 70–99)
Glucose-Capillary: 313 mg/dL — ABNORMAL HIGH (ref 70–99)
Glucose-Capillary: 166 mg/dL — ABNORMAL HIGH (ref 70–99)
Glucose-Capillary: 211 mg/dL — ABNORMAL HIGH (ref 70–99)

## 2021-04-01 LAB — CBC
HCT: 38.6 % (ref 36.0–46.0)
Hemoglobin: 12.9 g/dL (ref 12.0–15.0)
MCH: 28.5 pg (ref 26.0–34.0)
MCHC: 33.4 g/dL (ref 30.0–36.0)
MCV: 85.4 fL (ref 80.0–100.0)
Platelets: 239 10*3/uL (ref 150–400)
RBC: 4.52 MIL/uL (ref 3.87–5.11)
RDW: 13.5 % (ref 11.5–15.5)
WBC: 24.5 10*3/uL — ABNORMAL HIGH (ref 4.0–10.5)
nRBC: 0 % (ref 0.0–0.2)

## 2021-04-01 LAB — CK: Total CK: 62 U/L (ref 38–234)

## 2021-04-01 LAB — PROCALCITONIN: Procalcitonin: 3.75 ng/mL

## 2021-04-01 LAB — HEMOGLOBIN A1C
Hgb A1c MFr Bld: 9.8 % — ABNORMAL HIGH (ref 4.8–5.6)
Mean Plasma Glucose: 235 mg/dL

## 2021-04-01 LAB — MAGNESIUM: Magnesium: 2.3 mg/dL (ref 1.7–2.4)

## 2021-04-01 MED ORDER — SODIUM CHLORIDE 0.9 % IV SOLN
1.0000 g | Freq: Four times a day (QID) | INTRAVENOUS | Status: DC
  Administered 2021-04-01 – 2021-04-02 (×4): 1 g via INTRAVENOUS
  Filled 2021-04-01 (×2): qty 1000
  Filled 2021-04-01: qty 1
  Filled 2021-04-01 (×2): qty 1000
  Filled 2021-04-01: qty 1

## 2021-04-01 MED ORDER — POTASSIUM CHLORIDE CRYS ER 20 MEQ PO TBCR
40.0000 meq | EXTENDED_RELEASE_TABLET | ORAL | Status: AC
Start: 2021-04-01 — End: 2021-04-01
  Administered 2021-04-01 (×2): 40 meq via ORAL
  Filled 2021-04-01 (×2): qty 2

## 2021-04-01 MED ORDER — INSULIN GLARGINE-YFGN 100 UNIT/ML ~~LOC~~ SOLN
5.0000 [IU] | Freq: Every day | SUBCUTANEOUS | Status: DC
  Administered 2021-04-01: 5 [IU] via SUBCUTANEOUS
  Filled 2021-04-01 (×2): qty 0.05

## 2021-04-01 NOTE — Evaluation (Signed)
Occupational Therapy Evaluation Patient Details Name: Katie Serrano MRN: [id number] DOB: Nov 19, 1955 Today's Date: 04/01/2021    History of Present Illness 65 yo female presenting to ED with fatigue and a fever on 7/25.  Also, reporting of left-sided flank pain. Admitted with UTI and hyponatremia. PMH including obesity, Eagle syndrome, elevated CRP, HTN, GERD, asthma, tobacco use, COPD, DM2, HLD, and osteoarthritis. (Simultaneous filing. User may not have seen previous data.)   Clinical Impression   PTA, pt was independent and lived with her husband. Currently, Pt requiring supervision for UB ADL and Mod A for LB ADL. Pt performing stand pivot transfer to [facility] with Mod A +2 for power up, and taking steps to [facility] with Min A for balance. Pt with decreased strength and pain throughout session and requiring increased time for movement. Pt presenting with decreased strength, balance, activity tolerance, and safety. Recommend discharge home with [facility] and will continue to follow acutely to optimize independence in ADLs.     Follow Up Recommendations  Home health OT;Supervision/Assistance - 24 hour    Equipment Recommendations  3 in 1 bedside commode    Recommendations for Other Services       Precautions / Restrictions Precautions Precautions: Fall (Simultaneous filing. User may not have seen previous data.) Restrictions Weight Bearing Restrictions: No (Simultaneous filing. User may not have seen previous data.)      Mobility Bed Mobility Overal bed mobility: Needs Assistance (Simultaneous filing. User may not have seen previous data.) Bed Mobility: Supine to Sit;Sit to Supine     Supine to sit: Min assist;HOB elevated Sit to supine: Min assist   General bed mobility comments: min A for trunk to upright, pt able to manage LE off bed, requires min A for returning LE to bed and squaring shoulders to bed (Simultaneous filing. User may not have seen previous data.)    Transfers Overall  transfer level: Needs assistance Equipment used: Rolling walker (2 wheeled) Transfers: Sit to/from [employer] Sit to Stand: Mod assist;+2 physical assistance;From elevated surface         General transfer comment: pt initially attempted to come to standing and was unable to clear hips from bed surface, requires modAx2 for coming to fully upright, vc for hand placement for power up    Balance Overall balance assessment: Mild deficits observed, not formally tested                                         ADL either performed or assessed with clinical judgement   ADL Overall ADL's : Needs assistance/impaired Eating/Feeding: Set up;Sitting;Supervision/ safety   Grooming: Set up;Sitting;Supervision/safety   Upper Body Bathing: Set up;Supervision/ safety;Sitting   Lower Body Bathing: Moderate assistance;Sit to/from stand;+2 for physical assistance;+2 for safety/equipment   Upper Body Dressing : Sitting;Supervision/safety   Lower Body Dressing: Moderate assistance;Sit to/from stand;+2 for physical assistance;+2 for safety/equipment   Toilet Transfer: BSC;RW;Stand-pivot;Moderate assistance;+2 for physical assistance;+2 for safety/equipment   Toileting- Clothing Manipulation and Hygiene: Sit to/from stand;Minimal assistance Toileting - Clothing Manipulation Details (indicate cue type and reason): Min A for balance during clothing management and wiping     Functional mobility during ADLs: Minimal assistance;Rolling walker General ADL Comments: Pt requiring supervision /safety for UB ADL and Mod A+2 for LB ADL. Min A for functional mobility during pivot transfer     Vision   Vision Assessment?: No apparent visual deficits  Perception Perception Perception Tested?: No Comments: no apparent difficulty with perception   Praxis Praxis Praxis tested?: Not tested Praxis-Other Comments: no apparent difficulty with motor planning    Pertinent  Vitals/Pain Pain Assessment: Faces (Simultaneous filing. User may not have seen previous data.) Faces Pain Scale: Hurts even more (Simultaneous filing. User may not have seen previous data.) Pain Location: generalized muscle and joint pain (Simultaneous filing. User may not have seen previous data.) Pain Descriptors / Indicators: Discomfort;Grimacing;Guarding;Moaning;Sore (Simultaneous filing. User may not have seen previous data.) Pain Intervention(s): Limited activity within patient's tolerance;Monitored during session;Repositioned (Simultaneous filing. User may not have seen previous data.)     Hand Dominance     Extremity/Trunk Assessment Upper Extremity Assessment Upper Extremity Assessment: Generalized weakness (generalized weakness. LUE 3+/5 and RUE 4-/5  Simultaneous filing. User may not have seen previous data.) RUE Deficits / Details: 4-/5 MMT shoulder flexion/extension, grip strength LUE Deficits / Details: 3+/5 MMT shoulder flexion/extension, grip strength   Lower Extremity Assessment Lower Extremity Assessment: Defer to PT evaluation (Simultaneous filing. User may not have seen previous data.)   Cervical / Trunk Assessment Cervical / Trunk Assessment: Kyphotic (slight rounding forward of shoulders)   Communication Communication Communication: No difficulties (Simultaneous filing. User may not have seen previous data.)   Cognition Arousal/Alertness: Awake/alert (Simultaneous filing. User may not have seen previous data.) Behavior During Therapy: [facility] for tasks assessed/performed (Simultaneous filing. User may not have seen previous data.) Overall Cognitive Status: Impaired/Different from baseline (Simultaneous filing. User may not have seen previous data.) Area of Impairment: Safety/judgement                         Safety/Judgement: Decreased awareness of safety;Decreased awareness of deficits   Problem Solving: Requires verbal cues General Comments: Overall, pt  appears to have intact reasonoing. However, requiring cues for safety with transfers and RW use.   General Comments  VSS, pt reports she is still feeling hot and sweaty    Exercises     Shoulder Instructions      Home Living Family/patient expects to be discharged to:: Private residence (Simultaneous filing. User may not have seen previous data.) Living Arrangements: Spouse/significant other (spouse mike  Simultaneous filing. User may not have seen previous data.) Available Help at Discharge: Family (Simultaneous filing. User may not have seen previous data.) Type of Home: House (Simultaneous filing. User may not have seen previous data.) Home Access: Level entry (Simultaneous filing. User may not have seen previous data.)     Home Layout: One level (Simultaneous filing. User may not have seen previous data.)     Bathroom Shower/Tub: Walk-in shower;Tub/shower unit    Bathroom Toilet: Standard (Simultaneous filing. User may not have seen previous data.)     Home Equipment: Shower seat (Simultaneous filing. User may not have seen previous data.)   Additional Comments: Pt reporting she can use walk in shower at her house on discharge      Prior Functioning/Environment Level of Independence: Independent (Simultaneous filing. User may not have seen previous data.)        Comments: watches 4 grandchildren every day        OT Problem List: Decreased strength;Decreased activity tolerance;Impaired balance (sitting and/or standing);Decreased safety awareness;Pain      OT Treatment/Interventions: Self-care/ADL training;Therapeutic exercise;DME and/or AE instruction;Therapeutic activities;Patient/family education    OT Goals(Current goals can be found in the care plan section) Acute Rehab OT Goals Patient Stated Goal: get back to PLOF OT  Goal Formulation: With patient Time For Goal Achievement: 04/15/21 Potential to Achieve Goals: Good  OT Frequency: Min 2X/week   Barriers to  D/C:            Co-evaluation   Reason for Co-Treatment: For patient/therapist safety PT goals addressed during session: Mobility/safety with mobility        AM-PAC OT "6 Clicks" Daily Activity     Outcome Measure Help from another person eating meals?: A Little Help from another person taking care of personal grooming?: A Little Help from another person toileting, which includes using toliet, bedpan, or urinal?: A Lot Help from another person bathing (including washing, rinsing, drying)?: A Little Help from another person to put on and taking off regular upper body clothing?: A Little Help from another person to put on and taking off regular lower body clothing?: A Lot 6 Click Score: 16   End of Session Equipment Utilized During Treatment: Gait belt;Rolling walker Nurse Communication: Mobility status  Activity Tolerance: Patient tolerated treatment well Patient left: in bed;with call bell/phone within reach;with bed alarm set  OT Visit Diagnosis: Unsteadiness on feet (R26.81);Muscle weakness (generalized) (M62.81);Pain Pain - part of body:  (generalized)                Time: 1610-9604 OT Time Calculation (min): 28 min Charges:  OT General Charges $OT Visit: 1 Visit OT Evaluation $OT Eval Low Complexity: 1 Low  Ladene Artist, OTDS   Ladene Artist 04/01/2021, 10:43 AM

## 2021-04-01 NOTE — Evaluation (Signed)
Physical Therapy Evaluation Patient Details Name: Katie Serrano MRN: [id number] DOB: 1955-12-20 Today's Date: 04/01/2021   History of Present Illness  65 y.o. female  presents to ED 03/30/21 with complaints of general malaise, decreased appetite, nausea, emesis, L flank pain and fever as high as 105 F . Admitted for observation and treatment of UTI and hyponatremia PMH: class I obesity, Eagle syndrome, elevated CRP, hypertension, GERD, asthma, tobacco use disorder, COPD, type II DM, hyperlipidemia, osteoarthritis of multiple joints  Clinical Impression  PTA pt living with husband in single story home with level entry. Pt completely independent, watching 4 grandchildren daily. Pt is currently limited in safe mobility by generalized muscle and joint soreness, and generalized weakness. Pt is currently min A for bed mobility, modAx2 for transfers, min A for stepping to [facility]. Given pt PLOF and assist at home PT currently recommending HHPT at discharge. PT will continue to follow acutely.    Follow Up Recommendations Home health PT;Supervision/Assistance - 24 hour    Equipment Recommendations  Rolling walker with 5" wheels;3in1 (PT)       Precautions / Restrictions Precautions Precautions: None Restrictions Weight Bearing Restrictions: No      Mobility  Bed Mobility Overal bed mobility: Needs Assistance Bed Mobility: Supine to Sit;Sit to Supine     Supine to sit: Min assist;HOB elevated Sit to supine: Min assist   General bed mobility comments: min A for trunk to upright, pt able to manage LE off bed, requires min A for returning LE to bed and squaring shoulders to bed    Transfers Overall transfer level: Needs assistance Equipment used: Rolling walker (2 wheeled) Transfers: Sit to/from [employer] Sit to Stand: Mod assist;+2 physical assistance;From elevated surface         General transfer comment: pt initially attempted to come to standing and was unable to  clear hips from bed surface, requires modAx2 for coming to fully upright, vc for hand placement for power up  Ambulation/Gait Ambulation/Gait assistance: Min assist Gait Distance (Feet): 2 Feet Assistive device: Rolling walker (2 wheeled) Gait Pattern/deviations: Step-to pattern;Decreased step length - right;Decreased step length - left;Shuffle Gait velocity: slowed Gait velocity interpretation: <1.31 ft/sec, indicative of household ambulator General Gait Details: min A for steadying with stepping transfer to [facility]        Balance Overall balance assessment: Mild deficits observed, not formally tested                                           Pertinent Vitals/Pain Pain Assessment: Faces Faces Pain Scale: Hurts even more Pain Location: generalized, back and LE with movement Pain Descriptors / Indicators: Aching;Sore Pain Intervention(s): Limited activity within patient's tolerance;Monitored during session;Repositioned    Home Living Family/patient expects to be discharged to:: Private residence Living Arrangements: Spouse/significant other Available Help at Discharge: Available 24 hours/day Type of Home: House Home Access: Level entry     Home Layout: One level Home Equipment: None      Prior Function Level of Independence: Independent         Comments: watches 4 grandchildren every day        Extremity/Trunk Assessment   Upper Extremity Assessment Upper Extremity Assessment: Defer to OT evaluation    Lower Extremity Assessment Lower Extremity Assessment: Generalized weakness       Communication   Communication: No difficulties  Cognition Arousal/Alertness: Awake/alert  Behavior During Therapy: WFL for tasks assessed/performed Overall Cognitive Status: Within Functional Limits for tasks assessed                                        General Comments General comments (skin integrity, edema, etc.): VSS, pt reports she is  still feeling hot and sweaty        Assessment/Plan    PT Assessment Patient needs continued PT services  PT Problem List         PT Treatment Interventions DME instruction;Gait training;Functional mobility training;Therapeutic activities;Therapeutic exercise;Balance training;Cognitive remediation;Patient/family education    PT Goals (Current goals can be found in the Care Plan section)  Acute Rehab PT Goals Patient Stated Goal: get back to PLOF PT Goal Formulation: With patient Time For Goal Achievement: 04/15/21 Potential to Achieve Goals: Good    Frequency Min 3X/week        Co-evaluation PT/OT/SLP Co-Evaluation/Treatment: Yes Reason for Co-Treatment: For patient/therapist safety PT goals addressed during session: Mobility/safety with mobility         AM-PAC PT "6 Clicks" Mobility  Outcome Measure Help needed turning from your back to your side while in a flat bed without using bedrails?: None Help needed moving from lying on your back to sitting on the side of a flat bed without using bedrails?: A Little Help needed moving to and from a bed to a chair (including a wheelchair)?: A Lot Help needed standing up from a chair using your arms (e.g., wheelchair or bedside chair)?: A Lot Help needed to walk in hospital room?: A Lot Help needed climbing 3-5 steps with a railing? : Total 6 Click Score: 14    End of Session Equipment Utilized During Treatment: Gait belt Activity Tolerance: Patient tolerated treatment well Patient left: in bed;with call bell/phone within reach;with bed alarm set Nurse Communication: Mobility status PT Visit Diagnosis: Other abnormalities of gait and mobility (R26.89);Muscle weakness (generalized) (M62.81);Difficulty in walking, not elsewhere classified (R26.2);Pain Pain - part of body:  (generalized)    Time: 4259-5638 PT Time Calculation (min) (ACUTE ONLY): 26 min   Charges:   PT Evaluation $PT Eval Low Complexity: 1 Low           Tashianna Broome B. Beverely Risen PT, DPT Acute Rehabilitation Services Pager [phone] Office [phone]   Elon Alas Fleet 04/01/2021, 10:28 AM

## 2021-04-01 NOTE — Progress Notes (Signed)
Went in to check on pt. Pt asked for assistance getting up to use BSC. Pt c/o feeling really hot and sweaty. When pt stood up this RN noticed that pt's bed and gown were saturated with sweat. Gown and linen were both changed. Pt was able to give herself a bath while sitting on BSC. Pt states that previously reported pain and nausea have only improved some. No new pain at this time. Will continue to monitor.

## 2021-04-01 NOTE — Progress Notes (Signed)
PROGRESS NOTE    Katie Serrano  CBU:[id number] DOB: 06-22-1956 DOA: 03/30/2021 PCP: Gordan Payment., MD   Brief Narrative:  65 year old with history of class I obesity, Eagle syndrome, elevated CRP, HTN, GERD, asthma, tobacco use, COPD, DM2, HLD, osteoarthritis came to the ER with complaints of generalized malaise and fever of 105 degrees.  During this time patient had poor oral intake.  Also reporting of left-sided flank pain.  Upon admission noted to have UTI, hyponatremia. Started on IV Abx.    Assessment & Plan:   Principal Problem:   SIRS (systemic inflammatory response syndrome) (HCC) Active Problems:   Class 1 obesity due to excess calories with serious comorbidity and body mass index (BMI) of 30.0 to 30.9 in adult   Essential hypertension   Mixed hyperlipidemia   Coronary artery disease of native artery of native heart with stable angina pectoris (HCC)   Type 2 diabetes mellitus with hyperglycemia (HCC)   Hyponatremia  Urinary tract infection and hematuria with left-sided flank pain -UCx showing Gram + cocci- Staph and aerococcus. Will Add Ampicillin and cont Rocephin.  -Blood cultures not ordered on admission therefore will order - CT abdomen pelvis renal study-b/l Pyelonephritis  Check CK   Hyponatremia, suspect hypovolemia - Improved with IV fluids   Essential hypertension - Continue Norvasc 5 mg daily, losartan 50 mg bedtime, metoprolol 25 mg twice daily.  IV hydralazine as needed     Mixed hyperlipidemia -Lipitor 80 mg daily     Coronary artery disease -Currently chest pain-free.  Continue home meds     Type 2 diabetes mellitus with hyperglycemia (HCC), hemoglobin A1c 9.8 -Diabetic diet.  Hold metformin.  Insulin sliding scale and Accu-Cheks - Lantus 5 units daily     Class 1 obesity Needs lifestyle modifications. Follow-up with PCP.    DVT prophylaxis: Lovenox Code Status: Full code Family Communication:  Called Husband- no answer. Called Delice Bison she  is upto date  Dispo: The patient is from: Home              Anticipated d/c is to: Home              Patient currently is not medically stable to d/c. Currently needs IV Abx.    Difficult to place patient Yes    Subjective: Still reports of feeling very weak with muscle aches.  States her statin isnt new.  No other complaints. Quite active at home.   Review of Systems Otherwise negative except as per HPI, including: General = no fevers, chills, dizziness,  fatigue HEENT/EYES = negative for loss of vision, double vision, blurred vision,  sore throa Cardiovascular= negative for chest pain, palpitation Respiratory/lungs= negative for shortness of breath, cough, wheezing; hemoptysis,  Gastrointestinal= negative for nausea, vomiting, abdominal pain Genitourinary= negative for Dysuria MSK = Negative for arthralgia, Neurology= Negative for headache, numbness, tingling  Psychiatry= Negative for suicidal and homocidal ideation Skin= Negative for Rash    Examination:  Constitutional: Not in acute distress Respiratory: Clear to auscultation bilaterally Cardiovascular: Normal sinus rhythm, no rubs Abdomen: Nontender nondistended good bowel sounds Musculoskeletal: No edema noted Skin: No rashes seen Neurologic: CN 2-12 grossly intact.  And nonfocal Psychiatric: Normal judgment and insight. Alert and oriented x 3. Normal mood.    Objective: Vitals:   03/31/21 1755 03/31/21 2031 04/01/21 0053 04/01/21 0451  BP: 136/69 (!) 168/80 119/66 (!) 152/78  Pulse: 89 (!) 105 74 85  Resp: 16 18 18 18   Temp: 98.6 F (37 C)  99.8 F (37.7 C) 97.9 F (36.6 C) 98.1 F (36.7 C)  TempSrc: Oral Oral Oral Oral  SpO2: 92% 92% 92% 95%  Weight:      Height:        Intake/Output Summary (Last 24 hours) at 04/01/2021 0820 Last data filed at 04/01/2021 0359 Gross per 24 hour  Intake 1142.05 ml  Output --  Net 1142.05 ml   Filed Weights   03/30/21 2345  Weight: 83.9 kg    Data Reviewed:    CBC: Recent Labs  Lab 03/30/21 2021 03/31/21 0442 04/01/21 0504  WBC 20.7* 21.5* 24.5*  NEUTROABS  --  17.6*  --   HGB 14.6 14.0 12.9  HCT 43.3 42.1 38.6  MCV 86.9 87.9 85.4  PLT 195 214 239   Basic Metabolic Panel: Recent Labs  Lab 03/30/21 2021 03/31/21 0442 03/31/21 1418 04/01/21 0504  NA 127* 125* 127* 132*  K 3.7 3.8 3.8 3.2*  CL 94* 93* 92* 96*  CO2 22 17* 21* 25  GLUCOSE 281* 271* 271* 247*  BUN 13 17 15 14   CREATININE 0.95 0.95 0.95 0.87  CALCIUM 8.8* 8.3* 8.8* 8.6*  MG  --  2.1  --  2.3   GFR: Estimated Creatinine Clearance: 70.3 mL/min (by C-G formula based on SCr of 0.87 mg/dL). Liver Function Tests: Recent Labs  Lab 03/31/21 0442  AST 36  ALT 52*  ALKPHOS 72  BILITOT 0.8  PROT 6.4*  ALBUMIN 2.7*   No results for input(s): LIPASE, AMYLASE in the last 168 hours. No results for input(s): AMMONIA in the last 168 hours. Coagulation Profile: No results for input(s): INR, PROTIME in the last 168 hours. Cardiac Enzymes: Recent Labs  Lab 03/31/21 0442  CKTOTAL 244*   BNP (last 3 results) No results for input(s): PROBNP in the last 8760 hours. HbA1C: Recent Labs    03/31/21 1354  HGBA1C 9.8*   CBG: Recent Labs  Lab 03/31/21 0235 03/31/21 1300 03/31/21 1606 03/31/21 2138 04/01/21 0626  GLUCAP 292* 276* 267* 250* 263*   Lipid Profile: No results for input(s): CHOL, HDL, LDLCALC, TRIG, CHOLHDL, LDLDIRECT in the last 72 hours. Thyroid Function Tests: No results for input(s): TSH, T4TOTAL, FREET4, T3FREE, THYROIDAB in the last 72 hours. Anemia Panel: No results for input(s): VITAMINB12, FOLATE, FERRITIN, TIBC, IRON, RETICCTPCT in the last 72 hours. Sepsis Labs: Recent Labs  Lab 03/31/21 0442  PROCALCITON 2.97    Recent Results (from the past 240 hour(s))  Resp Panel by RT-PCR (Flu A&B, Covid) Nasopharyngeal Swab     Status: None   Collection Time: 03/31/21 12:21 AM   Specimen: Nasopharyngeal Swab; Nasopharyngeal(NP) swabs in vial  transport medium  Result Value Ref Range Status   SARS Coronavirus 2 by RT PCR NEGATIVE NEGATIVE Final    Comment: (NOTE) SARS-CoV-2 target nucleic acids are NOT DETECTED.  The SARS-CoV-2 RNA is generally detectable in upper respiratory specimens during the acute phase of infection. The lowest concentration of SARS-CoV-2 viral copies this assay can detect is 138 copies/mL. A negative result does not preclude SARS-Cov-2 infection and should not be used as the sole basis for treatment or other patient management decisions. A negative result may occur with  improper specimen collection/handling, submission of specimen other than nasopharyngeal swab, presence of viral mutation(s) within the areas targeted by this assay, and inadequate number of viral copies(<138 copies/mL). A negative result must be combined with clinical observations, patient history, and epidemiological information. The expected result is Negative.  Fact Sheet  for Patients:  BloggerCourse.com  Fact Sheet for Healthcare Providers:  SeriousBroker.it  This test is no t yet approved or cleared by the [place] FDA and  has been authorized for detection and/or diagnosis of SARS-CoV-2 by FDA under an Emergency Use Authorization (EUA). This EUA will remain  in effect (meaning this test can be used) for the duration of the COVID-19 declaration under Section 564(b)(1) of the Act, 21 U.S.C.section 360bbb-3(b)(1), unless the authorization is terminated  or revoked sooner.       Influenza A by PCR NEGATIVE NEGATIVE Final   Influenza B by PCR NEGATIVE NEGATIVE Final    Comment: (NOTE) The Xpert Xpress SARS-CoV-2/FLU/RSV plus assay is intended as an aid in the diagnosis of influenza from Nasopharyngeal swab specimens and should not be used as a sole basis for treatment. Nasal washings and aspirates are unacceptable for Xpert Xpress SARS-CoV-2/FLU/RSV testing.  Fact  Sheet for Patients: BloggerCourse.com  Fact Sheet for Healthcare Providers: SeriousBroker.it  This test is not yet approved or cleared by the [place] FDA and has been authorized for detection and/or diagnosis of SARS-CoV-2 by FDA under an Emergency Use Authorization (EUA). This EUA will remain in effect (meaning this test can be used) for the duration of the COVID-19 declaration under Section 564(b)(1) of the Act, 21 U.S.C. section 360bbb-3(b)(1), unless the authorization is terminated or revoked.  Performed at [facility], [address], [place], [place] [address]          Radiology Studies: DG Chest 1 View  Result Date: 03/30/2021 CLINICAL DATA:  General malaise for 4 days.  Vomiting and fever. EXAM: CHEST  1 VIEW COMPARISON:  07/29/2005 FINDINGS: Normal heart size and pulmonary vascularity. No focal airspace disease or consolidation in the lungs. No blunting of costophrenic angles. No pneumothorax. Mediastinal contours appear intact. Postoperative changes in the cervical spine. IMPRESSION: No active disease. Electronically Signed   By: Burman Nieves M.D.   On: 03/30/2021 23:19   CT RENAL STONE STUDY  Result Date: 03/31/2021 CLINICAL DATA:  Flank pain with kidney stone suspected. Nausea and vomiting with fever and malaise. Symptoms for 4 days. EXAM: CT ABDOMEN AND PELVIS WITHOUT CONTRAST TECHNIQUE: Multidetector CT imaging of the abdomen and pelvis was performed following the standard protocol without IV contrast. COMPARISON:  None. FINDINGS: Lower chest:  No contributory findings. Hepatobiliary: Hepatic steatosis.No evidence of biliary obstruction or stone. Pancreas: Unremarkable. Spleen: Unremarkable. Adrenals/Urinary Tract: Negative adrenals. No hydronephrosis or stone. Diffuse perinephric stranding and prominent renal size bilaterally. Suspect urothelial thickening bilaterally at the level of the renal pelvis.  Unremarkable bladder. Stomach/Bowel:  No obstruction. No appendicitis. Vascular/Lymphatic: No acute vascular abnormality. No mass or adenopathy. Reproductive:No pathologic findings. Other: No ascites or pneumoperitoneum. Small supraumbilical fat hernia. Musculoskeletal: No acute abnormalities. IMPRESSION: Bilateral perinephric stranding and probable urothelial thickening, suspect bilateral pyelonephritis in this setting. No hydronephrosis or stone. Fatty liver. Electronically Signed   By: Marnee Spring M.D.   On: 03/31/2021 09:12        Scheduled Meds:  amLODipine  5 mg Oral Daily   aspirin  81 mg Oral Daily   atorvastatin  80 mg Oral Daily   enoxaparin (LOVENOX) injection  40 mg Subcutaneous Q24H   insulin aspart  0-15 Units Subcutaneous TID WC   insulin aspart  0-5 Units Subcutaneous QHS   insulin glargine-yfgn  5 Units Subcutaneous Daily   losartan  50 mg Oral QHS   metoprolol tartrate  25 mg Oral BID  multivitamin with minerals  1 tablet Oral Daily   omega-3 acid ethyl esters  1 g Oral Daily   potassium chloride  40 mEq Oral Q4H   Continuous Infusions:  sodium chloride 75 mL/hr at 04/01/21 0703   cefTRIAXone (ROCEPHIN)  IV 1 g (04/01/21 0359)     LOS: 0 days   Time spent= 35 mins    Iolani Twilley Joline Maxcy, MD [facility]  If 7PM-7AM, please contact night-coverage  04/01/2021, 8:20 AM

## 2021-04-02 ENCOUNTER — Inpatient Hospital Stay (HOSPITAL_COMMUNITY): Payer: Medicare Other

## 2021-04-02 DIAGNOSIS — I25118 Atherosclerotic heart disease of native coronary artery with other forms of angina pectoris: Secondary | ICD-10-CM

## 2021-04-02 DIAGNOSIS — R7881 Bacteremia: Secondary | ICD-10-CM

## 2021-04-02 DIAGNOSIS — R6510 Systemic inflammatory response syndrome (SIRS) of non-infectious origin without acute organ dysfunction: Secondary | ICD-10-CM

## 2021-04-02 DIAGNOSIS — B9562 Methicillin resistant Staphylococcus aureus infection as the cause of diseases classified elsewhere: Secondary | ICD-10-CM

## 2021-04-02 DIAGNOSIS — N10 Acute tubulo-interstitial nephritis: Secondary | ICD-10-CM

## 2021-04-02 DIAGNOSIS — E6609 Other obesity due to excess calories: Secondary | ICD-10-CM | POA: Diagnosis not present

## 2021-04-02 DIAGNOSIS — R29898 Other symptoms and signs involving the musculoskeletal system: Secondary | ICD-10-CM

## 2021-04-02 DIAGNOSIS — Z6830 Body mass index (BMI) 30.0-30.9, adult: Secondary | ICD-10-CM

## 2021-04-02 DIAGNOSIS — G062 Extradural and subdural abscess, unspecified: Secondary | ICD-10-CM

## 2021-04-02 LAB — BLOOD CULTURE ID PANEL (REFLEXED) - BCID2

## 2021-04-02 LAB — ECHOCARDIOGRAM COMPLETE
Area-P 1/2: 3.56 cm2
Height: 66 in
S' Lateral: 2.8 cm
Weight: 2959.46 oz

## 2021-04-02 LAB — URINE CULTURE: Culture: >=100000 — AB

## 2021-04-02 LAB — BASIC METABOLIC PANEL
Anion gap: 9 (ref 5–15)
BUN: 15 mg/dL (ref 8–23)
CO2: 24 mmol/L (ref 22–32)
Calcium: 8.4 mg/dL — ABNORMAL LOW (ref 8.9–10.3)
Chloride: 98 mmol/L (ref 98–111)
Creatinine, Ser: 0.87 mg/dL (ref 0.44–1.00)
GFR, Estimated: >60 mL/min (ref >60)
Glucose, Bld: 207 mg/dL — ABNORMAL HIGH (ref 70–99)
Potassium: 3.5 mmol/L (ref 3.5–5.1)
Sodium: 131 mmol/L — ABNORMAL LOW (ref 135–145)

## 2021-04-02 LAB — CBC
HCT: 36.6 % (ref 36.0–46.0)
Hemoglobin: 12.5 g/dL (ref 12.0–15.0)
MCH: 29.1 pg (ref 26.0–34.0)
MCHC: 34.2 g/dL (ref 30.0–36.0)
MCV: 85.3 fL (ref 80.0–100.0)
Platelets: 273 10*3/uL (ref 150–400)
RBC: 4.29 MIL/uL (ref 3.87–5.11)
RDW: 14 % (ref 11.5–15.5)
WBC: 23.7 10*3/uL — ABNORMAL HIGH (ref 4.0–10.5)
nRBC: 0 % (ref 0.0–0.2)

## 2021-04-02 LAB — GLUCOSE, CAPILLARY
Glucose-Capillary: 212 mg/dL — ABNORMAL HIGH (ref 70–99)
Glucose-Capillary: 208 mg/dL — ABNORMAL HIGH (ref 70–99)
Glucose-Capillary: 231 mg/dL — ABNORMAL HIGH (ref 70–99)
Glucose-Capillary: 209 mg/dL — ABNORMAL HIGH (ref 70–99)

## 2021-04-02 LAB — MAGNESIUM: Magnesium: 2.3 mg/dL (ref 1.7–2.4)

## 2021-04-02 MED ORDER — VANCOMYCIN HCL 2000 MG/400ML IV SOLN
2000.0000 mg | Freq: Once | INTRAVENOUS | Status: AC
  Administered 2021-04-02: 2000 mg via INTRAVENOUS
  Filled 2021-04-02: qty 400

## 2021-04-02 MED ORDER — INSULIN GLARGINE-YFGN 100 UNIT/ML ~~LOC~~ SOLN
8.0000 [IU] | Freq: Every day | SUBCUTANEOUS | Status: DC
  Administered 2021-04-02: 8 [IU] via SUBCUTANEOUS
  Filled 2021-04-02 (×2): qty 0.08

## 2021-04-02 MED ORDER — ALPRAZOLAM 0.5 MG PO TABS
0.5000 mg | ORAL_TABLET | Freq: Once | ORAL | Status: AC
  Administered 2021-04-03: 0.5 mg via ORAL
  Filled 2021-04-02: qty 1

## 2021-04-02 MED ORDER — VANCOMYCIN HCL 1500 MG/300ML IV SOLN
1500.0000 mg | INTRAVENOUS | Status: DC
  Administered 2021-04-03 – 2021-04-05 (×3): 1500 mg via INTRAVENOUS
  Filled 2021-04-02 (×3): qty 300

## 2021-04-02 MED ORDER — OXYCODONE-ACETAMINOPHEN 5-325 MG PO TABS
1.0000 | ORAL_TABLET | Freq: Once | ORAL | Status: AC
Start: 2021-04-02 — End: 2021-04-02
  Administered 2021-04-02: 1 via ORAL
  Filled 2021-04-02: qty 1

## 2021-04-02 NOTE — Progress Notes (Addendum)
Patient shared that she is claustrophobic and will require something for MRI. MD paged. MRI tech aware.   Order received. MRI tech, Summer updated of POC, she will notify RN when there is a good time for pt to transport to MRI.

## 2021-04-02 NOTE — Progress Notes (Signed)
PHARMACY - PHYSICIAN COMMUNICATION CRITICAL VALUE ALERT - BLOOD CULTURE IDENTIFICATION (BCID)  Katie Serrano is an 65 y.o. female who presented to [facility] on 03/30/2021 with a chief complaint of general malaise.  Assessment:  Started on ABX for SIRS w/ urine growing Staph and aerococcus, now growing MRSA in 1 of 3 blood cx bottles.  Name of physician (or Provider) ContactedAnthonette Legato MD   Current antibiotics: ceftriaxone and ampicillin  Changes to prescribed antibiotics recommended:  Change ampicillin to vancomycin 2000mg  x1 then 1500mg  IV Q24H. Goal AUC 400-550.  Expected AUC 450.   SCr 0.87. Consider d/c'ing ceftriaxone; vanc should cover aerococcus.    Results for orders placed or performed during the hospital encounter of 03/30/21  Blood Culture ID Panel (Reflexed) (Collected: 04/01/2021 11:08 AM)  Result Value Ref Range   Enterococcus faecalis NOT DETECTED NOT DETECTED   Enterococcus Faecium NOT DETECTED NOT DETECTED   Listeria monocytogenes NOT DETECTED NOT DETECTED   Staphylococcus species DETECTED (A) NOT DETECTED   Staphylococcus aureus (BCID) DETECTED (A) NOT DETECTED   Staphylococcus epidermidis NOT DETECTED NOT DETECTED   Staphylococcus lugdunensis NOT DETECTED NOT DETECTED   Streptococcus species NOT DETECTED NOT DETECTED   Streptococcus agalactiae NOT DETECTED NOT DETECTED   Streptococcus pneumoniae NOT DETECTED NOT DETECTED   Streptococcus pyogenes NOT DETECTED NOT DETECTED   A.calcoaceticus-baumannii NOT DETECTED NOT DETECTED   Bacteroides fragilis NOT DETECTED NOT DETECTED   Enterobacterales NOT DETECTED NOT DETECTED   Enterobacter cloacae complex NOT DETECTED NOT DETECTED   Escherichia coli NOT DETECTED NOT DETECTED   Klebsiella aerogenes NOT DETECTED NOT DETECTED   Klebsiella oxytoca NOT DETECTED NOT DETECTED   Klebsiella pneumoniae NOT DETECTED NOT DETECTED   Proteus species NOT DETECTED NOT DETECTED   Salmonella species NOT DETECTED NOT DETECTED    Serratia marcescens NOT DETECTED NOT DETECTED   Haemophilus influenzae NOT DETECTED NOT DETECTED   Neisseria meningitidis NOT DETECTED NOT DETECTED   Pseudomonas aeruginosa NOT DETECTED NOT DETECTED   Stenotrophomonas maltophilia NOT DETECTED NOT DETECTED   Candida albicans NOT DETECTED NOT DETECTED   Candida auris NOT DETECTED NOT DETECTED   Candida glabrata NOT DETECTED NOT DETECTED   Candida krusei NOT DETECTED NOT DETECTED   Candida parapsilosis NOT DETECTED NOT DETECTED   Candida tropicalis NOT DETECTED NOT DETECTED   Cryptococcus neoformans/gattii NOT DETECTED NOT DETECTED   Meth resistant mecA/C and MREJ DETECTED (A) NOT DETECTED    04/01/21, PharmD, BCPS  04/02/2021  7:03 AM

## 2021-04-02 NOTE — Progress Notes (Signed)
  Echocardiogram 2D Echocardiogram has been performed.  Pieter Partridge 04/02/2021, 11:12 AM

## 2021-04-02 NOTE — Consult Note (Addendum)
Date of Admission:  03/30/2021          Reason for Consult: MRSA bacteremia     Referring Provider: Dr. Stephania Fragmin    Assessment:  MRSA bacteremia: Patient presented with subjective/objective fevers, though she has been afebrile since being hospitalized, chills, BU/LE weakness, and bilateral myalgias. Found to have a leukocytosis with a neutrophilic predominance, blood culture 7/26 from L antecubital (anaerobic bottle only growing MRSA), R hand blood cultures (aerobic bottle only growing GPCs in clusters), Ucx growing aerococcus 40K CFU and MRSA >100K CFU. CT A/P showed bilateral perinephric stranding c/f bilateral pyelonephritis. Patient received 4 doses of ampicillin and 3 doses of ceftriaxone. Vancomycin was started 7/27 and she received 1x dose. Patient has a TTE pending. Unclear exact etiology of patient's MRSA bacteremia however she did have LHC 7/6 via R radial artery. Patient also has psoriasis prominent on her elbows which could have led to entry of MRSA. Once patient became bacteremic her GU tract was likely seeded resulting in her image confirmed pyelonephritis. Patient also reports lumbar back pain with shooting pain down the bilateral legs which is concerning for possible discitis v osteomyelitis v epidural abscess.  MRSA pyelonephritis: Patient denies dysuria or polyuria but has bilateral CVA tenderness L>R. In addition to CT A/P findings described above. Ucx also grew aerococcus. Patient's infection likely 2/2 MRSA bacteremia.  NSTEMI: Patient has history of CAD and multiple risk factors HTN, T2DM, HLD. Patient reports intermittent chest pain prior to presenting to the ED. She denies active chest pain at bedside. EKG 7/25 with no signs of acute ischemia. Patient's troponin was elevated to 348 and repeat was 390.   Plan:  Patient has TTE pending. If negative for vegetation recommend TEE to rule out IE. Given patient's back pain with shooting down the bilateral legs recommend MRI of  the lumbar spine. Patient has no hardware. Continue vancomycin for now. Will repeat blood cultures 48 hours after start of IV antistaphylococcal treatment.  (7/29 AM) No evidence of obstruction noted on CT A/P continue antibiotics per above.  Trend cardiac biomarkers, consider cards consult. Continue medical therapy.   Principal Problem:   SIRS (systemic inflammatory response syndrome) (HCC) Active Problems:   Class 1 obesity due to excess calories with serious comorbidity and body mass index (BMI) of 30.0 to 30.9 in adult   Essential hypertension   Mixed hyperlipidemia   Coronary artery disease of native artery of native heart with stable angina pectoris (HCC)   Type 2 diabetes mellitus with hyperglycemia (HCC)   Hyponatremia   Acute pyelonephritis   Scheduled Meds:  amLODipine  5 mg Oral Daily   aspirin  81 mg Oral Daily   atorvastatin  80 mg Oral Daily   enoxaparin (LOVENOX) injection  40 mg Subcutaneous Q24H   insulin aspart  0-15 Units Subcutaneous TID WC   insulin aspart  0-5 Units Subcutaneous QHS   insulin glargine-yfgn  8 Units Subcutaneous Daily   losartan  50 mg Oral QHS   metoprolol tartrate  25 mg Oral BID   multivitamin with minerals  1 tablet Oral Daily   omega-3 acid ethyl esters  1 g Oral Daily   Continuous Infusions:  [START ON 04/03/2021] vancomycin     vancomycin 2,000 mg (04/02/21 0846)   PRN Meds:.acetaminophen, dextromethorphan-guaiFENesin, hydrALAZINE, ipratropium-albuterol, nitroGLYCERIN, ondansetron **OR** ondansetron (ZOFRAN) IV, senna-docusate  HPI: Katie Serrano is a 65 y.o. female with a past medical history significant for coronary artery disease status post  recent left heart catheterization, type 2 diabetes, hypertension, asthma, obesity, hyperlipidemia, and psoriasis.  Patient states that she was in her normal state approximately 1 week prior to presenting to the hospital.  She states that she was outside playing with her grandchildren in her  garden.  The following morning she says that she woke up and had difficulty moving.  She felt she could not make a fist or ambulate without assistance.  She also began experiencing subjective fevers and chills at that time.  She does not recall having any other symptoms.  Her symptoms did not improve and her weakness became progressively worse.  She also developed objective fevers, up to 105 F.  Since her symptoms did not improve she decided to present to the hospital.  Patient reports that she developed blurry vision in her right eye.  She does not recall when this started.  She states she intermittently experiences pain in the right and an associated headache.  She also notes random, intermittent chest pain that is independent of activity.  She does however note that sitting down usually relieves her pain.  Finally patient reports that she is experiencing shooting muscle pains in the bilateral legs, she again does not recall when this specifically started but does not recall this pain prior to the onset of her symptoms. She has stress urinary incontinence at baseline but no increased incontinence or fecal incontinence.   She denies shortness of breath, cough, dysphagia, odynophagia, difficulty stooling, diarrhea, polyuria, or dysuria.   Of note, approximately 3 weeks prior to presenting to the hospital (7/6), patient underwent left heart catheterization via right radial artery due to stable angina and cardiac CT RCA occlusion.   Review of Systems: Negative except per above.   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 Relevant Hx: Course: Daily Update: Today's Plan:discussed with her that her sugars will increase and she has actively worked on her diet and diet and lost from 242 to 188 which I commend her on, she is to continue with her weight loss efforts despite taking this   Electronically signed by: Sandi Mealy    Social History   Tobacco Use   Smoking status: Every Day    Packs/day: 0.50    Types: Cigarettes   Smokeless tobacco: Never  Vaping Use   Vaping Use: Never used  Substance Use Topics   Alcohol use: Yes    Alcohol/week: 1.0 standard drink    Types: 1 Glasses of wine per week    Comment: Socially   Drug use: Never    Family History  Problem Relation Age of Onset   Diabetes Mother    Heart attack Mother 13   Angina Father    Heart attack Father 71   Diabetes Paternal Grandmother  No Known Allergies  OBJECTIVE: Blood pressure (!) 161/89, pulse 88, temperature 98.9 F (37.2 C), temperature source Oral, resp. rate 18, height  (1.676 m), weight 83.9 kg, SpO2 94 %.  Physical Exam Vitals reviewed.  Constitutional:      General: She is not in acute distress.    Appearance: She is obese. She is not ill-appearing.  HENT:     Head: Normocephalic.     Mouth/Throat:     Mouth: Mucous membranes are moist.     Pharynx: Oropharynx is clear. No oropharyngeal exudate or posterior oropharyngeal erythema.  Eyes:     General:        Right eye: No discharge.        Left eye: No discharge.     Extraocular Movements: Extraocular movements intact.     Comments: R eye conjuncitiva mildly erythematous   Cardiovascular:      Rate and Rhythm: Normal rate and regular rhythm.     Heart sounds: Normal heart sounds. No murmur heard.   No friction rub. No gallop.  Pulmonary:     Effort: Pulmonary effort is normal.     Breath sounds: Normal breath sounds. No wheezing, rhonchi or rales.  Abdominal:     General: There is no distension.     Palpations: Abdomen is soft.     Tenderness: There is no abdominal tenderness. There is left CVA tenderness. There is no guarding.  Musculoskeletal:        General: No swelling or deformity.     Right lower leg: No edema.     Left lower leg: No edema.  Skin:    General: Skin is warm and dry.     Findings: Rash (bilateral elbows with erythematous papules on ~3cm plaque) present.     Comments: Insertion site of R radial LHC, non tender, non erythematous, no drainage  Neurological:     Mental Status: She is alert and oriented to person, place, and time.     Motor: Weakness present.     Comments: BLE proximal and distal muscles 3/5, BUE 3/5   Psychiatric:        Thought Content: Thought content normal.    Lab Results Lab Results  Component Value Date   WBC 23.7 (H) 04/02/2021   HGB 12.5 04/02/2021   HCT 36.6 04/02/2021   MCV 85.3 04/02/2021   PLT 273 04/02/2021    Lab Results  Component Value Date   CREATININE 0.87 04/02/2021   BUN 15 04/02/2021   NA 131 (L) 04/02/2021   K 3.5 04/02/2021   CL 98 04/02/2021   CO2 24 04/02/2021    Lab Results  Component Value Date   ALT 52 (H) 03/31/2021   AST 36 03/31/2021   ALKPHOS 72 03/31/2021   BILITOT 0.8 03/31/2021     Microbiology: Recent Results (from the past 240 hour(s))  Resp Panel by RT-PCR (Flu A&B, Covid) Nasopharyngeal Swab     Status: None   Collection Time: 03/31/21 12:21 AM   Specimen: Nasopharyngeal Swab; Nasopharyngeal(NP) swabs in vial transport medium  Result Value Ref Range Status   SARS Coronavirus 2 by RT PCR NEGATIVE NEGATIVE Final    Comment: (NOTE) SARS-CoV-2 target nucleic acids are NOT  DETECTED.  The SARS-CoV-2 RNA is generally detectable in upper respiratory specimens during the acute phase of infection. The lowest concentration of SARS-CoV-2 viral copies this assay can detect is 138 copies/mL. A negative result does not preclude SARS-Cov-2 infection and should not be  used as the sole basis for treatment or other patient management decisions. A negative result may occur with  improper specimen collection/handling, submission of specimen other than nasopharyngeal swab, presence of viral mutation(s) within the areas targeted by this assay, and inadequate number of viral copies(<138 copies/mL). A negative result must be combined with clinical observations, patient history, and epidemiological information. The expected result is Negative.  Fact Sheet for Patients:  BloggerCourse.comhttps://www.fda.gov/media/152166/download  Fact Sheet for Healthcare Providers:  SeriousBroker.ithttps://www.fda.gov/media/152162/download  This test is no t yet approved or cleared by the [place] FDA and  has been authorized for detection and/or diagnosis of SARS-CoV-2 by FDA under an Emergency Use Authorization (EUA). This EUA will remain  in effect (meaning this test can be used) for the duration of the COVID-19 declaration under Section 564(b)(1) of the Act, 21 U.S.C.section 360bbb-3(b)(1), unless the authorization is terminated  or revoked sooner.       Influenza A by PCR NEGATIVE NEGATIVE Final   Influenza B by PCR NEGATIVE NEGATIVE Final    Comment: (NOTE) The Xpert Xpress SARS-CoV-2/FLU/RSV plus assay is intended as an aid in the diagnosis of influenza from Nasopharyngeal swab specimens and should not be used as a sole basis for treatment. Nasal washings and aspirates are unacceptable for Xpert Xpress SARS-CoV-2/FLU/RSV testing.  Fact Sheet for Patients: BloggerCourse.comhttps://www.fda.gov/media/152166/download  Fact Sheet for Healthcare Providers: SeriousBroker.ithttps://www.fda.gov/media/152162/download  This test is not yet  approved or cleared by the [place] FDA and has been authorized for detection and/or diagnosis of SARS-CoV-2 by FDA under an Emergency Use Authorization (EUA). This EUA will remain in effect (meaning this test can be used) for the duration of the COVID-19 declaration under Section 564(b)(1) of the Act, 21 U.S.C. section 360bbb-3(b)(1), unless the authorization is terminated or revoked.  Performed at [facility], [address] [place], [place] [address]   Urine Culture     Status: Abnormal   Collection Time: 03/31/21  5:51 AM   Specimen: Urine, Clean Catch  Result Value Ref Range Status   Specimen Description URINE, CLEAN CATCH  Final   Special Requests NONE  Final   Culture (A)  Final    >=100,000 COLONIES/mL METHICILLIN RESISTANT STAPHYLOCOCCUS AUREUS 40,000 COLONIES/mL AEROCOCCUS SPECIES Standardized susceptibility testing for this organism is not available. Performed at [facility], [address] [place], [place] [address]    Report Status 04/02/2021 FINAL  Final   Organism ID, Bacteria METHICILLIN RESISTANT STAPHYLOCOCCUS AUREUS (A)  Final      Susceptibility   Methicillin resistant staphylococcus aureus - MIC*    CIPROFLOXACIN >=8 RESISTANT Resistant     GENTAMICIN <=0.5 SENSITIVE Sensitive     NITROFURANTOIN <=16 SENSITIVE Sensitive     OXACILLIN >=4 RESISTANT Resistant     TETRACYCLINE <=1 SENSITIVE Sensitive     VANCOMYCIN 1 SENSITIVE Sensitive     TRIMETH/SULFA <=10 SENSITIVE Sensitive     CLINDAMYCIN <=0.25 SENSITIVE Sensitive     RIFAMPIN <=0.5 SENSITIVE Sensitive     Inducible Clindamycin NEGATIVE Sensitive     * >=100,000 COLONIES/mL METHICILLIN RESISTANT STAPHYLOCOCCUS AUREUS  Culture, blood (Routine X 2) w Reflex to ID Panel     Status: None (Preliminary result)   Collection Time: 04/01/21 11:08 AM   Specimen: BLOOD  Result Value Ref Range Status   Specimen Description BLOOD LEFT ANTECUBITAL  Final   Special Requests   Final    BOTTLES  DRAWN AEROBIC AND ANAEROBIC Blood Culture adequate volume   Culture  Setup  Time   Final    GRAM POSITIVE COCCI IN CLUSTERS ANAEROBIC BOTTLE ONLY Organism ID to follow CRITICAL RESULT CALLED TO, READ BACK BY AND VERIFIED WITH: PARMD MIRANDA BRYK BY MESSAN H. AT [id number] 02 7 27 2022 Performed at [facility], [address] [place], [place] [address]    Culture GRAM POSITIVE COCCI  Final   Report Status PENDING  Incomplete  Blood Culture ID Panel (Reflexed)     Status: Abnormal   Collection Time: 04/01/21 11:08 AM  Result Value Ref Range Status   Enterococcus faecalis NOT DETECTED NOT DETECTED Final   Enterococcus Faecium NOT DETECTED NOT DETECTED Final   Listeria monocytogenes NOT DETECTED NOT DETECTED Final   Staphylococcus species DETECTED (A) NOT DETECTED Final    Comment: CRITICAL RESULT CALLED TO, READ BACK BY AND VERIFIED WITH: PARMD MIRANDA BRYK BY MESSAN H. AT 0552 02 7 27 2022    Staphylococcus aureus (BCID) DETECTED (A) NOT DETECTED Final    Comment: Methicillin (oxacillin)-resistant Staphylococcus aureus (MRSA). MRSA is predictably resistant to beta-lactam antibiotics (except ceftaroline). Preferred therapy is vancomycin unless clinically contraindicated. Patient requires contact precautions if  hospitalized. CRITICAL RESULT CALLED TO, READ BACK BY AND VERIFIED WITH: PARMD MIRANDA BRYK BY MESSAN H. AT 0552 02 7 27 2022    Staphylococcus epidermidis NOT DETECTED NOT DETECTED Final   Staphylococcus lugdunensis NOT DETECTED NOT DETECTED Final   Streptococcus species NOT DETECTED NOT DETECTED Final   Streptococcus agalactiae NOT DETECTED NOT DETECTED Final   Streptococcus pneumoniae NOT DETECTED NOT DETECTED Final   Streptococcus pyogenes NOT DETECTED NOT DETECTED Final   A.calcoaceticus-baumannii NOT DETECTED NOT DETECTED Final   Bacteroides fragilis NOT DETECTED NOT DETECTED Final   Enterobacterales NOT DETECTED NOT DETECTED Final   Enterobacter cloacae complex NOT  DETECTED NOT DETECTED Final   Escherichia coli NOT DETECTED NOT DETECTED Final   Klebsiella aerogenes NOT DETECTED NOT DETECTED Final   Klebsiella oxytoca NOT DETECTED NOT DETECTED Final   Klebsiella pneumoniae NOT DETECTED NOT DETECTED Final   Proteus species NOT DETECTED NOT DETECTED Final   Salmonella species NOT DETECTED NOT DETECTED Final   Serratia marcescens NOT DETECTED NOT DETECTED Final   Haemophilus influenzae NOT DETECTED NOT DETECTED Final   Neisseria meningitidis NOT DETECTED NOT DETECTED Final   Pseudomonas aeruginosa NOT DETECTED NOT DETECTED Final   Stenotrophomonas maltophilia NOT DETECTED NOT DETECTED Final   Candida albicans NOT DETECTED NOT DETECTED Final   Candida auris NOT DETECTED NOT DETECTED Final   Candida glabrata NOT DETECTED NOT DETECTED Final   Candida krusei NOT DETECTED NOT DETECTED Final   Candida parapsilosis NOT DETECTED NOT DETECTED Final   Candida tropicalis NOT DETECTED NOT DETECTED Final   Cryptococcus neoformans/gattii NOT DETECTED NOT DETECTED Final   Meth resistant mecA/C and MREJ DETECTED (A) NOT DETECTED Final    Comment: CRITICAL RESULT CALLED TO, READ BACK BY AND VERIFIED WITH: PARMD MIRANDA BRYK BY MESSAN H. AT 0552 02 7 27 2022 Performed at [facility], [address], [place], [place] [address]   Culture, blood (Routine X 2) w Reflex to ID Panel     Status: None (Preliminary result)   Collection Time: 04/01/21 11:18 AM   Specimen: BLOOD RIGHT HAND  Result Value Ref Range Status   Specimen Description BLOOD RIGHT HAND  Final   Special Requests   Final    BOTTLES DRAWN AEROBIC ONLY Blood Culture adequate volume   Culture  Setup Time  Final    GRAM POSITIVE COCCI IN CLUSTERS AEROBIC BOTTLE ONLY CRITICAL VALUE NOTED.  VALUE IS CONSISTENT WITH PREVIOUSLY REPORTED AND CALLED VALUE. Performed at [facility], [address], [place], [place] [address]    Culture [facility] POSITIVE COCCI  Final   Report Status PENDING   Incomplete    Marolyn Haller, MD Internal Medicine PGY-2 [phone] pager  04/02/2021, 10:25 AM

## 2021-04-02 NOTE — Progress Notes (Signed)
RN called MRI again to f/u with transport status of pt going to MRI, no answer. Will call back.

## 2021-04-02 NOTE — Progress Notes (Signed)
Physical Therapy Treatment Patient Details Name: Katie Serrano MRN: [id number] DOB: 05/21/1956 Today's Date: 04/02/2021    History of Present Illness 65 yo female presenting to ED with fatigue and a fever on 7/25.  Also, reporting of left-sided flank pain. Admitted with UTI and hyponatremia. PMH including obesity, Eagle syndrome, elevated CRP, HTN, GERD, asthma, tobacco use, COPD, DM2, HLD, and osteoarthritis.    PT Comments    Pt and husband in room on entry. Pt reports she is still experiencing pain in her back and and LE that is limiting her mobility. Pt is currently mod A for bed mobility, min A for transfers and min A for lateral side stepping along EoB. D/c plans remain appropriate at this time. PT will continue to follow acutely to progress mobility.     Follow Up Recommendations  Home health PT;Supervision/Assistance - 24 hour     Equipment Recommendations  Rolling walker with 5" wheels;3in1 (PT)    Recommendations for Other Services       Precautions / Restrictions Precautions Precautions: Fall Restrictions Weight Bearing Restrictions: No    Mobility  Bed Mobility Overal bed mobility: Needs Assistance Bed Mobility: Supine to Sit;Sit to Supine     Supine to sit: HOB elevated;Mod assist Sit to supine: Min assist   General bed mobility comments: modA for management of LE across bed, trunk to upright and pad scoot of hips to EoB    Transfers Overall transfer level: Needs assistance Equipment used: Rolling walker (2 wheeled) Transfers: Sit to/from [employer] Sit to Stand: From elevated surface;Min assist         General transfer comment: min A and increased time and effort to come to standing secondary to pain  Ambulation/Gait Ambulation/Gait assistance: Min assist Gait Distance (Feet): 3 Feet Assistive device: Rolling walker (2 wheeled) Gait Pattern/deviations: Step-to pattern;Decreased step length - right;Decreased step length -  left;Shuffle Gait velocity: slowed Gait velocity interpretation: <1.31 ft/sec, indicative of household ambulator General Gait Details: min A for lateral stepping along EoB, vc for sequencing       Balance Overall balance assessment: Mild deficits observed, not formally tested                                          Cognition Arousal/Alertness: Awake/alert Behavior During Therapy: WFL for tasks assessed/performed Overall Cognitive Status: Impaired/Different from baseline Area of Impairment: Safety/judgement                         Safety/Judgement: Decreased awareness of safety;Decreased awareness of deficits   Problem Solving: Requires verbal cues General Comments: requires cuing for safe RW usage         General Comments General comments (skin integrity, edema, etc.): VSS, pt reports being less hot and sweaty      Pertinent Vitals/Pain Pain Assessment: Faces Faces Pain Scale: Hurts whole lot Pain Location: generalized muscle and joint pain Pain Descriptors / Indicators: Discomfort;Grimacing;Guarding;Moaning;Sore Pain Intervention(s): Limited activity within patient's tolerance;Monitored during session;Repositioned     PT Goals (current goals can now be found in the care plan section) Acute Rehab PT Goals Patient Stated Goal: get back to PLOF PT Goal Formulation: With patient Time For Goal Achievement: 04/15/21 Potential to Achieve Goals: Good Progress towards PT goals: Not progressing toward goals - comment (limited by pain)    Frequency    Min  3X/week      PT Plan Current plan remains appropriate       AM-PAC PT "6 Clicks" Mobility   Outcome Measure  Help needed turning from your back to your side while in a flat bed without using bedrails?: None Help needed moving from lying on your back to sitting on the side of a flat bed without using bedrails?: A Little Help needed moving to and from a bed to a chair (including a  wheelchair)?: A Lot Help needed standing up from a chair using your arms (e.g., wheelchair or bedside chair)?: A Lot Help needed to walk in hospital room?: A Lot Help needed climbing 3-5 steps with a railing? : Total 6 Click Score: 14    End of Session Equipment Utilized During Treatment: Gait belt Activity Tolerance: Patient tolerated treatment well Patient left: in bed;with call bell/phone within reach;with bed alarm set Nurse Communication: Mobility status PT Visit Diagnosis: Other abnormalities of gait and mobility (R26.89);Muscle weakness (generalized) (M62.81);Difficulty in walking, not elsewhere classified (R26.2);Pain Pain - part of body:  (back and generalized)     Time: 5621-3086 PT Time Calculation (min) (ACUTE ONLY): 21 min  Charges:  $Therapeutic Activity: 8-22 mins                     Jaythan Hinely B. Beverely Risen PT, DPT Acute Rehabilitation Services Pager [phone] Office [phone]    Elon Alas Fleet 04/02/2021, 4:03 PM

## 2021-04-02 NOTE — Progress Notes (Signed)
PROGRESS NOTE    Katie Serrano  ZOX:[id number] DOB: 01/28/1956 DOA: 03/30/2021 PCP: Gordan PaymentGrisso, Greg A., MD   Brief Narrative:  65 year old with history of class I obesity, Eagle syndrome, elevated CRP, HTN, GERD, asthma, tobacco use, COPD, DM2, HLD, osteoarthritis came to the ER with complaints of generalized malaise and fever of 105 degrees.  During this time patient had poor oral intake.  Also reporting of left-sided flank pain.  Upon admission noted to have UTI, hyponatremia. Started on IV Abx.  Eventually urine cultures grew MRSA and Aerococcus, blood cultures became positive as well therefore vancomycin was added.  Infectious disease consulted.   Assessment & Plan:   Principal Problem:   SIRS (systemic inflammatory response syndrome) (HCC) Active Problems:   Class 1 obesity due to excess calories with serious comorbidity and body mass index (BMI) of 30.0 to 30.9 in adult   Essential hypertension   Mixed hyperlipidemia   Coronary artery disease of native artery of native heart with stable angina pectoris (HCC)   Type 2 diabetes mellitus with hyperglycemia (HCC)   Hyponatremia   Acute pyelonephritis  Urinary tract infection and hematuria with left-sided flank pain, MRSA Gram-positive bacteremia -Urine cultures-MRSA/Aerococcus.  Blood cultures positive for gram-positive cocci - Antibiotics-vancomycin  - Infectious disease consulted - Echocardiogram ordered - Surveillance cultures ordered - CT abdomen pelvis renal study-b/l Pyelonephritis   B/L Neuropathic Pain -MRI Lumbar spine ordered  Hyponatremia, suspect hypovolemia - Improved with IV fluids   Essential hypertension - Continue Norvasc 5 mg daily, losartan 50 mg bedtime, metoprolol 25 mg twice daily.  IV hydralazine as needed     Mixed hyperlipidemia -Lipitor 80 mg daily     Coronary artery disease -Currently chest pain-free.  Continue home meds     Type 2 diabetes mellitus with hyperglycemia (HCC), hemoglobin A1c  9.8 -Diabetic diet.  Hold metformin.  Insulin sliding scale and Accu-Cheks - Lantus 8 units daily     Class 1 obesity Needs lifestyle modifications. Follow-up with PCP.   DVT prophylaxis: Lovenox Code Status: Full code Family Communication: Delice Bisonara updated   Dispo: The patient is from: Home              Anticipated d/c is to: Home              Patient currently is not medically stable to d/c. Currently needs IV Abx.    Difficult to place patient Yes    Subjective: Afebrile overnight.  Complaints of b/l LE cramping today.  Had pus collection in her elbow about a month ago.   Review of Systems Otherwise negative except as per HPI, including: General = no fevers, chills, dizziness,  fatigue HEENT/EYES = negative for loss of vision, double vision, blurred vision,  sore throa Cardiovascular= negative for chest pain, palpitation Respiratory/lungs= negative for shortness of breath, cough, wheezing; hemoptysis,  Gastrointestinal= negative for nausea, vomiting, abdominal pain Genitourinary= negative for Dysuria MSK = Negative for arthralgia, myalgias Neurology= Negative for headache, numbness, tingling  Psychiatry= Negative for suicidal and homocidal ideation Skin= Negative for Rash    Examination:  Constitutional: Not in acute distress Respiratory: Clear to auscultation bilaterally Cardiovascular: Normal sinus rhythm, no rubs Abdomen: Nontender nondistended good bowel sounds Musculoskeletal: No edema noted Skin: No rashes seen Neurologic: CN 2-12 grossly intact.  And nonfocal Psychiatric: Normal judgment and insight. Alert and oriented x 3. Normal mood.    Objective: Vitals:   04/01/21 1702 04/01/21 2131 04/02/21 0113 04/02/21 0543  BP: 126/67 140/69  Marland Kitchen(!)  161/89  Pulse: 83 78  88  Resp: 18 18  18   Temp: 98.2 F (36.8 C)  98.6 F (37 C) 98.9 F (37.2 C)  TempSrc: Oral   Oral  SpO2: 97% 94%  94%  Weight:      Height:        Intake/Output Summary (Last 24 hours)  at 04/02/2021 0830 Last data filed at 04/02/2021 0549 Gross per 24 hour  Intake 740 ml  Output 900 ml  Net -160 ml   Filed Weights   03/30/21 2345  Weight: 83.9 kg    Data Reviewed:   CBC: Recent Labs  Lab 03/30/21 2021 03/31/21 0442 04/01/21 0504 04/02/21 0353  WBC 20.7* 21.5* 24.5* 23.7*  NEUTROABS  --  17.6*  --   --   HGB 14.6 14.0 12.9 12.5  HCT 43.3 42.1 38.6 36.6  MCV 86.9 87.9 85.4 85.3  PLT 195 214 239 273   Basic Metabolic Panel: Recent Labs  Lab 03/30/21 2021 03/31/21 0442 03/31/21 1418 04/01/21 0504 04/02/21 0353  NA 127* 125* 127* 132* 131*  K 3.7 3.8 3.8 3.2* 3.5  CL 94* 93* 92* 96* 98  CO2 22 17* 21* 25 24  GLUCOSE 281* 271* 271* 247* 207*  BUN 13 17 15 14 15   CREATININE 0.95 0.95 0.95 0.87 0.87  CALCIUM 8.8* 8.3* 8.8* 8.6* 8.4*  MG  --  2.1  --  2.3 2.3   GFR: Estimated Creatinine Clearance: 70.3 mL/min (by C-G formula based on SCr of 0.87 mg/dL). Liver Function Tests: Recent Labs  Lab 03/31/21 0442  AST 36  ALT 52*  ALKPHOS 72  BILITOT 0.8  PROT 6.4*  ALBUMIN 2.7*   No results for input(s): LIPASE, AMYLASE in the last 168 hours. No results for input(s): AMMONIA in the last 168 hours. Coagulation Profile: No results for input(s): INR, PROTIME in the last 168 hours. Cardiac Enzymes: Recent Labs  Lab 03/31/21 0442 04/01/21 1108  CKTOTAL 244* 62   BNP (last 3 results) No results for input(s): PROBNP in the last 8760 hours. HbA1C: Recent Labs    03/31/21 1354  HGBA1C 9.8*   CBG: Recent Labs  Lab 04/01/21 0626 04/01/21 1131 04/01/21 1607 04/01/21 2124 04/02/21 0618  GLUCAP 263* 313* 166* 211* 212*   Lipid Profile: No results for input(s): CHOL, HDL, LDLCALC, TRIG, CHOLHDL, LDLDIRECT in the last 72 hours. Thyroid Function Tests: No results for input(s): TSH, T4TOTAL, FREET4, T3FREE, THYROIDAB in the last 72 hours. Anemia Panel: No results for input(s): VITAMINB12, FOLATE, FERRITIN, TIBC, IRON, RETICCTPCT in the last  72 hours. Sepsis Labs: Recent Labs  Lab 03/31/21 0442 04/01/21 1108  PROCALCITON 2.97 3.75    Recent Results (from the past 240 hour(s))  Resp Panel by RT-PCR (Flu A&B, Covid) Nasopharyngeal Swab     Status: None   Collection Time: 03/31/21 12:21 AM   Specimen: Nasopharyngeal Swab; Nasopharyngeal(NP) swabs in vial transport medium  Result Value Ref Range Status   SARS Coronavirus 2 by RT PCR NEGATIVE NEGATIVE Final    Comment: (NOTE) SARS-CoV-2 target nucleic acids are NOT DETECTED.  The SARS-CoV-2 RNA is generally detectable in upper respiratory specimens during the acute phase of infection. The lowest concentration of SARS-CoV-2 viral copies this assay can detect is 138 copies/mL. A negative result does not preclude SARS-Cov-2 infection and should not be used as the sole basis for treatment or other patient management decisions. A negative result may occur with  improper specimen collection/handling, submission of specimen  other than nasopharyngeal swab, presence of viral mutation(s) within the areas targeted by this assay, and inadequate number of viral copies(<138 copies/mL). A negative result must be combined with clinical observations, patient history, and epidemiological information. The expected result is Negative.  Fact Sheet for Patients:  BloggerCourse.com  Fact Sheet for Healthcare Providers:  SeriousBroker.it  This test is no t yet approved or cleared by the [place] FDA and  has been authorized for detection and/or diagnosis of SARS-CoV-2 by FDA under an Emergency Use Authorization (EUA). This EUA will remain  in effect (meaning this test can be used) for the duration of the COVID-19 declaration under Section 564(b)(1) of the Act, 21 U.S.C.section 360bbb-3(b)(1), unless the authorization is terminated  or revoked sooner.       Influenza A by PCR NEGATIVE NEGATIVE Final   Influenza B by PCR NEGATIVE  NEGATIVE Final    Comment: (NOTE) The Xpert Xpress SARS-CoV-2/FLU/RSV plus assay is intended as an aid in the diagnosis of influenza from Nasopharyngeal swab specimens and should not be used as a sole basis for treatment. Nasal washings and aspirates are unacceptable for Xpert Xpress SARS-CoV-2/FLU/RSV testing.  Fact Sheet for Patients: BloggerCourse.com  Fact Sheet for Healthcare Providers: SeriousBroker.it  This test is not yet approved or cleared by the [place] FDA and has been authorized for detection and/or diagnosis of SARS-CoV-2 by FDA under an Emergency Use Authorization (EUA). This EUA will remain in effect (meaning this test can be used) for the duration of the COVID-19 declaration under Section 564(b)(1) of the Act, 21 U.S.C. section 360bbb-3(b)(1), unless the authorization is terminated or revoked.  Performed at [facility], [address], [place], [place] [address]   Urine Culture     Status: Abnormal   Collection Time: 03/31/21  5:51 AM   Specimen: Urine, Clean Catch  Result Value Ref Range Status   Specimen Description URINE, CLEAN CATCH  Final   Special Requests NONE  Final   Culture (A)  Final    >=100,000 COLONIES/mL METHICILLIN RESISTANT STAPHYLOCOCCUS AUREUS 40,000 COLONIES/mL AEROCOCCUS SPECIES Standardized susceptibility testing for this organism is not available. Performed at [facility], [address], [place], [place] [address]    Report Status 04/02/2021 FINAL  Final   Organism ID, Bacteria METHICILLIN RESISTANT STAPHYLOCOCCUS AUREUS (A)  Final      Susceptibility   Methicillin resistant staphylococcus aureus - MIC*    CIPROFLOXACIN >=8 RESISTANT Resistant     GENTAMICIN <=0.5 SENSITIVE Sensitive     NITROFURANTOIN <=16 SENSITIVE Sensitive     OXACILLIN >=4 RESISTANT Resistant     TETRACYCLINE <=1 SENSITIVE Sensitive     VANCOMYCIN 1 SENSITIVE Sensitive     TRIMETH/SULFA  <=10 SENSITIVE Sensitive     CLINDAMYCIN <=0.25 SENSITIVE Sensitive     RIFAMPIN <=0.5 SENSITIVE Sensitive     Inducible Clindamycin NEGATIVE Sensitive     * >=100,000 COLONIES/mL METHICILLIN RESISTANT STAPHYLOCOCCUS AUREUS  Culture, blood (Routine X 2) w Reflex to ID Panel     Status: None (Preliminary result)   Collection Time: 04/01/21 11:08 AM   Specimen: BLOOD  Result Value Ref Range Status   Specimen Description BLOOD LEFT ANTECUBITAL  Final   Special Requests   Final    BOTTLES DRAWN AEROBIC AND ANAEROBIC Blood Culture adequate volume   Culture  Setup Time   Final    GRAM POSITIVE COCCI IN CLUSTERS ANAEROBIC BOTTLE ONLY Organism ID to follow CRITICAL RESULT CALLED TO, READ BACK  BY AND VERIFIED WITH: PARMD MIRANDA BRYK BY MESSAN H. AT 1610 02 7 27 2022 Performed at [facility], [address], [place], [place] [address]    Culture GRAM POSITIVE COCCI  Final   Report Status PENDING  Incomplete  Blood Culture ID Panel (Reflexed)     Status: Abnormal   Collection Time: 04/01/21 11:08 AM  Result Value Ref Range Status   Enterococcus faecalis NOT DETECTED NOT DETECTED Final   Enterococcus Faecium NOT DETECTED NOT DETECTED Final   Listeria monocytogenes NOT DETECTED NOT DETECTED Final   Staphylococcus species DETECTED (A) NOT DETECTED Final    Comment: CRITICAL RESULT CALLED TO, READ BACK BY AND VERIFIED WITH: PARMD MIRANDA BRYK BY MESSAN H. AT 0552 02 7 27 2022    Staphylococcus aureus (BCID) DETECTED (A) NOT DETECTED Final    Comment: Methicillin (oxacillin)-resistant Staphylococcus aureus (MRSA). MRSA is predictably resistant to beta-lactam antibiotics (except ceftaroline). Preferred therapy is vancomycin unless clinically contraindicated. Patient requires contact precautions if  hospitalized. CRITICAL RESULT CALLED TO, READ BACK BY AND VERIFIED WITH: PARMD MIRANDA BRYK BY MESSAN H. AT 0552 02 7 27 2022    Staphylococcus epidermidis NOT DETECTED NOT DETECTED Final    Staphylococcus lugdunensis NOT DETECTED NOT DETECTED Final   Streptococcus species NOT DETECTED NOT DETECTED Final   Streptococcus agalactiae NOT DETECTED NOT DETECTED Final   Streptococcus pneumoniae NOT DETECTED NOT DETECTED Final   Streptococcus pyogenes NOT DETECTED NOT DETECTED Final   A.calcoaceticus-baumannii NOT DETECTED NOT DETECTED Final   Bacteroides fragilis NOT DETECTED NOT DETECTED Final   Enterobacterales NOT DETECTED NOT DETECTED Final   Enterobacter cloacae complex NOT DETECTED NOT DETECTED Final   Escherichia coli NOT DETECTED NOT DETECTED Final   Klebsiella aerogenes NOT DETECTED NOT DETECTED Final   Klebsiella oxytoca NOT DETECTED NOT DETECTED Final   Klebsiella pneumoniae NOT DETECTED NOT DETECTED Final   Proteus species NOT DETECTED NOT DETECTED Final   Salmonella species NOT DETECTED NOT DETECTED Final   Serratia marcescens NOT DETECTED NOT DETECTED Final   Haemophilus influenzae NOT DETECTED NOT DETECTED Final   Neisseria meningitidis NOT DETECTED NOT DETECTED Final   Pseudomonas aeruginosa NOT DETECTED NOT DETECTED Final   Stenotrophomonas maltophilia NOT DETECTED NOT DETECTED Final   Candida albicans NOT DETECTED NOT DETECTED Final   Candida auris NOT DETECTED NOT DETECTED Final   Candida glabrata NOT DETECTED NOT DETECTED Final   Candida krusei NOT DETECTED NOT DETECTED Final   Candida parapsilosis NOT DETECTED NOT DETECTED Final   Candida tropicalis NOT DETECTED NOT DETECTED Final   Cryptococcus neoformans/gattii NOT DETECTED NOT DETECTED Final   Meth resistant mecA/C and MREJ DETECTED (A) NOT DETECTED Final    Comment: CRITICAL RESULT CALLED TO, READ BACK BY AND VERIFIED WITH: PARMD MIRANDA BRYK BY MESSAN H. AT 0552 02 7 27 2022 Performed at [facility], [address] [place], [place] [address]          Radiology Studies: CT RENAL STONE STUDY  Result Date: 03/31/2021 CLINICAL DATA:  Flank pain with kidney stone suspected. Nausea and  vomiting with fever and malaise. Symptoms for 4 days. EXAM: CT ABDOMEN AND PELVIS WITHOUT CONTRAST TECHNIQUE: Multidetector CT imaging of the abdomen and pelvis was performed following the standard protocol without IV contrast. COMPARISON:  None. FINDINGS: Lower chest:  No contributory findings. Hepatobiliary: Hepatic steatosis.No evidence of biliary obstruction or stone. Pancreas: Unremarkable. Spleen: Unremarkable. Adrenals/Urinary Tract: Negative adrenals. No hydronephrosis or stone. Diffuse perinephric stranding  and prominent renal size bilaterally. Suspect urothelial thickening bilaterally at the level of the renal pelvis. Unremarkable bladder. Stomach/Bowel:  No obstruction. No appendicitis. Vascular/Lymphatic: No acute vascular abnormality. No mass or adenopathy. Reproductive:No pathologic findings. Other: No ascites or pneumoperitoneum. Small supraumbilical fat hernia. Musculoskeletal: No acute abnormalities. IMPRESSION: Bilateral perinephric stranding and probable urothelial thickening, suspect bilateral pyelonephritis in this setting. No hydronephrosis or stone. Fatty liver. Electronically Signed   By: Marnee Spring M.D.   On: 03/31/2021 09:12        Scheduled Meds:  amLODipine  5 mg Oral Daily   aspirin  81 mg Oral Daily   atorvastatin  80 mg Oral Daily   enoxaparin (LOVENOX) injection  40 mg Subcutaneous Q24H   insulin aspart  0-15 Units Subcutaneous TID WC   insulin aspart  0-5 Units Subcutaneous QHS   insulin glargine-yfgn  5 Units Subcutaneous Daily   losartan  50 mg Oral QHS   metoprolol tartrate  25 mg Oral BID   multivitamin with minerals  1 tablet Oral Daily   omega-3 acid ethyl esters  1 g Oral Daily   Continuous Infusions:  cefTRIAXone (ROCEPHIN)  IV 1 g (04/02/21 0257)   [START ON 04/03/2021] vancomycin     vancomycin       LOS: 1 day   Time spent= 35 mins    Alyzae Hawkey Joline Maxcy, MD [facility]  If 7PM-7AM, please contact night-coverage  04/02/2021, 8:30  AM

## 2021-04-02 NOTE — Plan of Care (Signed)
  Problem: Education: Goal: Knowledge of General Education information will improve Description Including pain rating scale, medication(s)/side effects and non-pharmacologic comfort measures Outcome: Progressing   Problem: Health Behavior/Discharge Planning: Goal: Ability to manage health-related needs will improve Outcome: Progressing   

## 2021-04-03 ENCOUNTER — Inpatient Hospital Stay (HOSPITAL_COMMUNITY): Payer: Medicare Other

## 2021-04-03 DIAGNOSIS — G062 Extradural and subdural abscess, unspecified: Secondary | ICD-10-CM | POA: Diagnosis not present

## 2021-04-03 DIAGNOSIS — E1165 Type 2 diabetes mellitus with hyperglycemia: Secondary | ICD-10-CM

## 2021-04-03 DIAGNOSIS — E6609 Other obesity due to excess calories: Secondary | ICD-10-CM | POA: Diagnosis not present

## 2021-04-03 DIAGNOSIS — R6510 Systemic inflammatory response syndrome (SIRS) of non-infectious origin without acute organ dysfunction: Secondary | ICD-10-CM | POA: Diagnosis not present

## 2021-04-03 DIAGNOSIS — I25118 Atherosclerotic heart disease of native coronary artery with other forms of angina pectoris: Secondary | ICD-10-CM | POA: Diagnosis not present

## 2021-04-03 LAB — CBC
HCT: 36.2 % (ref 36.0–46.0)
Hemoglobin: 12.5 g/dL (ref 12.0–15.0)
MCH: 29.4 pg (ref 26.0–34.0)
MCHC: 34.5 g/dL (ref 30.0–36.0)
MCV: 85.2 fL (ref 80.0–100.0)
Platelets: 350 10*3/uL (ref 150–400)
RBC: 4.25 MIL/uL (ref 3.87–5.11)
RDW: 14 % (ref 11.5–15.5)
WBC: 22.8 10*3/uL — ABNORMAL HIGH (ref 4.0–10.5)
nRBC: 0 % (ref 0.0–0.2)

## 2021-04-03 LAB — BASIC METABOLIC PANEL
Anion gap: 7 (ref 5–15)
BUN: 13 mg/dL (ref 8–23)
CO2: 25 mmol/L (ref 22–32)
Calcium: 8.7 mg/dL — ABNORMAL LOW (ref 8.9–10.3)
Chloride: 101 mmol/L (ref 98–111)
Creatinine, Ser: 0.88 mg/dL (ref 0.44–1.00)
GFR, Estimated: >60 mL/min (ref >60)
Glucose, Bld: 179 mg/dL — ABNORMAL HIGH (ref 70–99)
Potassium: 3.3 mmol/L — ABNORMAL LOW (ref 3.5–5.1)
Sodium: 133 mmol/L — ABNORMAL LOW (ref 135–145)

## 2021-04-03 LAB — GLUCOSE, CAPILLARY
Glucose-Capillary: 186 mg/dL — ABNORMAL HIGH (ref 70–99)
Glucose-Capillary: 204 mg/dL — ABNORMAL HIGH (ref 70–99)
Glucose-Capillary: 250 mg/dL — ABNORMAL HIGH (ref 70–99)
Glucose-Capillary: 266 mg/dL — ABNORMAL HIGH (ref 70–99)

## 2021-04-03 LAB — MAGNESIUM: Magnesium: 2.2 mg/dL (ref 1.7–2.4)

## 2021-04-03 MED ORDER — GADOBUTROL 1 MMOL/ML IV SOLN
8.5000 mL | Freq: Once | INTRAVENOUS | Status: AC | PRN
  Administered 2021-04-03: 8.5 mL via INTRAVENOUS

## 2021-04-03 MED ORDER — GADOBUTROL 1 MMOL/ML IV SOLN: 8.5000 mL | Freq: Once | INTRAVENOUS | Status: DC | PRN

## 2021-04-03 MED ORDER — INSULIN GLARGINE-YFGN 100 UNIT/ML ~~LOC~~ SOLN
10.0000 [IU] | Freq: Every day | SUBCUTANEOUS | Status: DC
  Administered 2021-04-03 – 2021-04-04 (×2): 10 [IU] via SUBCUTANEOUS
  Filled 2021-04-03 (×2): qty 0.1

## 2021-04-03 MED ORDER — POTASSIUM CHLORIDE CRYS ER 20 MEQ PO TBCR
40.0000 meq | EXTENDED_RELEASE_TABLET | Freq: Once | ORAL | Status: AC
  Administered 2021-04-03: 40 meq via ORAL
  Filled 2021-04-03: qty 2

## 2021-04-03 MED ORDER — OXYCODONE HCL 5 MG PO TABS
5.0000 mg | ORAL_TABLET | ORAL | Status: DC | PRN
Start: 2021-04-03 — End: 2021-04-10
  Administered 2021-04-03 – 2021-04-10 (×25): 5 mg via ORAL
  Filled 2021-04-03 (×26): qty 1

## 2021-04-03 MED ORDER — ALPRAZOLAM 0.5 MG PO TABS
0.5000 mg | ORAL_TABLET | Freq: Once | ORAL | Status: AC
  Administered 2021-04-03: 0.5 mg via ORAL
  Filled 2021-04-03 (×2): qty 1

## 2021-04-03 NOTE — Progress Notes (Signed)
Physical Therapy Treatment Patient Details Name: Katie Serrano MRN: [id number] DOB: [birth year] Today's Date: 04/03/2021    History of Present Illness 65 yo female presenting to ED with fatigue and a fever on 7/25.  Also, reporting of left-sided flank pain. Admitted with UTI and hyponatremia. 04/03/21 MRI lumbar spine-joint effusion left L4-5 facet (arthritis vs septic arthritis); mass effect on left L5 nerve root; edema left>right lower paraspinals suggest myositis; small disc protrusions L3-4 and L4-5 potentially effecting the left L3 and L4 nerve roots    PMH including obesity, Eagle syndrome, elevated CRP, HTN, GERD, asthma, tobacco use, COPD, DM2, HLD, and osteoarthritis.    PT Comments    Patient able to progress to ambulation this date. Required ~25 minutes to walk 10 ft x 2 (seated rest between) due to pain and weakness. Required repeated min cues for safe use of RW during transfers and ambulation. Discharge recommendations updated as pt is not yet moving well enough to consider going home.    Follow Up Recommendations  CIR (making slower progress than anticipated)     Equipment Recommendations  Rolling walker with 5" wheels;3in1 (PT)    Recommendations for Other Services       Precautions / Restrictions Precautions Precautions: Fall    Mobility  Bed Mobility Overal bed mobility: Needs Assistance Bed Mobility: Rolling;Sidelying to Sit;Sit to Sidelying Rolling: Supervision Sidelying to sit: Mod assist     Sit to sidelying: Min assist General bed mobility comments: mod assist to help raise torso to sit; min assist to raise legs onto bed    Transfers Overall transfer level: Needs assistance Equipment used: Rolling walker (2 wheeled) Transfers: Sit to/from Stand Sit to Stand: Mod assist         General transfer comment: mod assist from EOB and from handicap toilet  Ambulation/Gait Ambulation/Gait assistance: Min assist Gait Distance (Feet): 10 Feet  (seated/toileted; 10) Assistive device: Rolling walker (2 wheeled) Gait Pattern/deviations: Step-to pattern;Decreased step length - right;Decreased step length - left;Shuffle Gait velocity: slowed   General Gait Details: vc for sequencing with RLE lead; vc for increased support/push through bil UEs as she fatigued   Stairs             Wheelchair Mobility    Modified Rankin (Stroke Patients Only)       Balance                                            Cognition Arousal/Alertness: Awake/alert Behavior During Therapy: WFL for tasks assessed/performed Overall Cognitive Status: Within Functional Limits for tasks assessed                                 General Comments: requires cuing for safe RW usage      Exercises      General Comments General comments (skin integrity, edema, etc.): Husband present      Pertinent Vitals/Pain Pain Assessment: 0-10 Pain Score: 6  Pain Location: generalized muscle and joint pain Pain Descriptors / Indicators: Discomfort;Grimacing;Guarding Pain Intervention(s): Limited activity within patient's tolerance;Monitored during session;Repositioned    Home Living                      Prior Function            PT Goals (current goals  can now be found in the care plan section) Acute Rehab PT Goals Patient Stated Goal: get back to PLOF Time For Goal Achievement: 04/15/21 Potential to Achieve Goals: Good Progress towards PT goals: Progressing toward goals    Frequency    Min 3X/week      PT Plan Discharge plan needs to be updated    Co-evaluation              AM-PAC PT "6 Clicks" Mobility   Outcome Measure  Help needed turning from your back to your side while in a flat bed without using bedrails?: None Help needed moving from lying on your back to sitting on the side of a flat bed without using bedrails?: A Lot Help needed moving to and from a bed to a chair (including a  wheelchair)?: A Lot Help needed standing up from a chair using your arms (e.g., wheelchair or bedside chair)?: A Lot Help needed to walk in hospital room?: A Little Help needed climbing 3-5 steps with a railing? : Total 6 Click Score: 14    End of Session Equipment Utilized During Treatment: Gait belt Activity Tolerance: Patient tolerated treatment well Patient left: in bed;with call bell/phone within reach;with bed alarm set Nurse Communication: Mobility status PT Visit Diagnosis: Other abnormalities of gait and mobility (R26.89);Muscle weakness (generalized) (M62.81);Difficulty in walking, not elsewhere classified (R26.2);Pain Pain - part of body:  (back and generalized)     Time: 4403-4742 PT Time Calculation (min) (ACUTE ONLY): 36 min  Charges:  $Gait Training: 23-37 mins                      Jerolyn Center, PT Pager [phone]    [name] 04/03/2021, 4:25 PM

## 2021-04-03 NOTE — H&P (View-Only) (Signed)
      Subjective: Persistent lower extremity weakness. No new complaints   Antibiotics:  Anti-infectives (From admission, onward)    Start     Dose/Rate Route Frequency Ordered Stop   04/03/21 0900  vancomycin (VANCOREADY) IVPB 1500 mg/300 mL        1,500 mg 150 mL/hr over 120 Minutes Intravenous Every 24 hours 04/02/21 0703     04/02/21 0800  vancomycin (VANCOREADY) IVPB 2000 mg/400 mL        2,000 mg 200 mL/hr over 120 Minutes Intravenous  Once 04/02/21 0703 04/02/21 1046   04/01/21 1230  ampicillin (OMNIPEN) 1 g in sodium chloride 0.9 % 100 mL IVPB  Status:  Discontinued        1 g 300 mL/hr over 20 Minutes Intravenous Every 6 hours 04/01/21 1137 04/02/21 0655   04/01/21 0300  cefTRIAXone (ROCEPHIN) 1 g in sodium chloride 0.9 % 100 mL IVPB  Status:  Discontinued        1 g 200 mL/hr over 30 Minutes Intravenous Every 24 hours 03/31/21 0653 04/02/21 0844   03/31/21 0300  cefTRIAXone (ROCEPHIN) 1 g in sodium chloride 0.9 % 100 mL IVPB        1 g 200 mL/hr over 30 Minutes Intravenous  Once 03/31/21 0250 03/31/21 0430       Medications: Scheduled Meds:  ALPRAZolam  0.5 mg Oral Once   amLODipine  5 mg Oral Daily   aspirin  81 mg Oral Daily   enoxaparin (LOVENOX) injection  40 mg Subcutaneous Q24H   insulin aspart  0-15 Units Subcutaneous TID WC   insulin aspart  0-5 Units Subcutaneous QHS   insulin glargine-yfgn  10 Units Subcutaneous Daily   losartan  50 mg Oral QHS   metoprolol tartrate  25 mg Oral BID   multivitamin with minerals  1 tablet Oral Daily   omega-3 acid ethyl esters  1 g Oral Daily   Continuous Infusions:  vancomycin 1,500 mg (04/03/21 0950)   PRN Meds:.acetaminophen, dextromethorphan-guaiFENesin, hydrALAZINE, ipratropium-albuterol, nitroGLYCERIN, ondansetron **OR** ondansetron (ZOFRAN) IV, oxyCODONE, senna-docusate    Objective: Weight change:   Intake/Output Summary (Last 24 hours) at 04/03/2021 1441 Last data filed at 04/03/2021 0700 Gross per  24 hour  Intake 960 ml  Output 2650 ml  Net -1690 ml   Blood pressure 134/70, pulse 68, temperature 99.1 F (37.3 C), temperature source Oral, resp. rate 18, height 5' 6" (1.676 m), weight 83.9 kg, SpO2 93 %. Temp:  [98 F (36.7 C)-99.8 F (37.7 C)] 99.1 F (37.3 C) (07/28 1145) Pulse Rate:  [68-88] 68 (07/28 1145) Resp:  [16-18] 18 (07/28 1145) BP: (133-158)/(70-76) 134/70 (07/28 1145) SpO2:  [93 %-97 %] 93 % (07/28 1145)  Physical Exam: Physical Exam Constitutional:      General: She is not in acute distress.    Appearance: Normal appearance. She is not ill-appearing.  HENT:     Head: Normocephalic and atraumatic.  Eyes:     General: No scleral icterus.       Left eye: No discharge.  Cardiovascular:     Rate and Rhythm: Normal rate and regular rhythm.  Pulmonary:     Effort: Pulmonary effort is normal.     Breath sounds: Normal breath sounds.  Abdominal:     General: There is no distension.     Tenderness: There is no abdominal tenderness.  Musculoskeletal:        General: No swelling or tenderness.  Neurological:       Mental Status: She is alert.     CBC:    BMET Recent Labs    04/02/21 0353 04/03/21 0015  NA 131* 133*  K 3.5 3.3*  CL 98 101  CO2 24 25  GLUCOSE 207* 179*  BUN 15 13  CREATININE 0.87 0.88  CALCIUM 8.4* 8.7*     Liver Panel  No results for input(s): PROT, ALBUMIN, AST, ALT, ALKPHOS, BILITOT, BILIDIR, IBILI in the last 72 hours.     Sedimentation Rate No results for input(s): ESRSEDRATE in the last 72 hours. C-Reactive Protein No results for input(s): CRP in the last 72 hours.  Micro Results: Recent Results (from the past 720 hour(s))  Resp Panel by RT-PCR (Flu A&B, Covid) Nasopharyngeal Swab     Status: None   Collection Time: 03/31/21 12:21 AM   Specimen: Nasopharyngeal Swab; Nasopharyngeal(NP) swabs in vial transport medium  Result Value Ref Range Status   SARS Coronavirus 2 by RT PCR NEGATIVE NEGATIVE Final    Comment:  (NOTE) SARS-CoV-2 target nucleic acids are NOT DETECTED.  The SARS-CoV-2 RNA is generally detectable in upper respiratory specimens during the acute phase of infection. The lowest concentration of SARS-CoV-2 viral copies this assay can detect is 138 copies/mL. A negative result does not preclude SARS-Cov-2 infection and should not be used as the sole basis for treatment or other patient management decisions. A negative result may occur with  improper specimen collection/handling, submission of specimen other than nasopharyngeal swab, presence of viral mutation(s) within the areas targeted by this assay, and inadequate number of viral copies(<138 copies/mL). A negative result must be combined with clinical observations, patient history, and epidemiological information. The expected result is Negative.  Fact Sheet for Patients:  https://www.fda.gov/media/152166/download  Fact Sheet for Healthcare Providers:  https://www.fda.gov/media/152162/download  This test is no t yet approved or cleared by the [place] FDA and  has been authorized for detection and/or diagnosis of SARS-CoV-2 by FDA under an Emergency Use Authorization (EUA). This EUA will remain  in effect (meaning this test can be used) for the duration of the COVID-19 declaration under Section 564(b)(1) of the Act, 21 U.S.C.section 360bbb-3(b)(1), unless the authorization is terminated  or revoked sooner.       Influenza A by PCR NEGATIVE NEGATIVE Final   Influenza B by PCR NEGATIVE NEGATIVE Final    Comment: (NOTE) The Xpert Xpress SARS-CoV-2/FLU/RSV plus assay is intended as an aid in the diagnosis of influenza from Nasopharyngeal swab specimens and should not be used as a sole basis for treatment. Nasal washings and aspirates are unacceptable for Xpert Xpress SARS-CoV-2/FLU/RSV testing.  Fact Sheet for Patients: https://www.fda.gov/media/152166/download  Fact Sheet for Healthcare  Providers: https://www.fda.gov/media/152162/download  This test is not yet approved or cleared by the [place] FDA and has been authorized for detection and/or diagnosis of SARS-CoV-2 by FDA under an Emergency Use Authorization (EUA). This EUA will remain in effect (meaning this test can be used) for the duration of the COVID-19 declaration under Section 564(b)(1) of the Act, 21 U.S.C. section 360bbb-3(b)(1), unless the authorization is terminated or revoked.  Performed at [facility], [address] [place], [place] [address]   Urine Culture     Status: Abnormal   Collection Time: 03/31/21  5:51 AM   Specimen: Urine, Clean Catch  Result Value Ref Range Status   Specimen Description URINE, CLEAN CATCH  Final   Special Requests NONE  Final   Culture (A)  Final    >=100,000 COLONIES/mL METHICILLIN   RESISTANT STAPHYLOCOCCUS AUREUS 40,000 COLONIES/mL AEROCOCCUS SPECIES Standardized susceptibility testing for this organism is not available. Performed at [facility], [address] [place], [place] [address]    Report Status 04/02/2021 FINAL  Final   Organism ID, Bacteria METHICILLIN RESISTANT STAPHYLOCOCCUS AUREUS (A)  Final      Susceptibility   Methicillin resistant staphylococcus aureus - MIC*    CIPROFLOXACIN >=8 RESISTANT Resistant     GENTAMICIN <=0.5 SENSITIVE Sensitive     NITROFURANTOIN <=16 SENSITIVE Sensitive     OXACILLIN >=4 RESISTANT Resistant     TETRACYCLINE <=1 SENSITIVE Sensitive     VANCOMYCIN 1 SENSITIVE Sensitive     TRIMETH/SULFA <=10 SENSITIVE Sensitive     CLINDAMYCIN <=0.25 SENSITIVE Sensitive     RIFAMPIN <=0.5 SENSITIVE Sensitive     Inducible Clindamycin NEGATIVE Sensitive     * >=100,000 COLONIES/mL METHICILLIN RESISTANT STAPHYLOCOCCUS AUREUS  Culture, blood (Routine X 2) w Reflex to ID Panel     Status: Abnormal (Preliminary result)   Collection Time: 04/01/21 11:08 AM   Specimen: BLOOD  Result Value Ref Range Status   Specimen  Description BLOOD LEFT ANTECUBITAL  Final   Special Requests   Final    BOTTLES DRAWN AEROBIC AND ANAEROBIC Blood Culture adequate volume   Culture  Setup Time   Final    GRAM POSITIVE COCCI IN CLUSTERS IN BOTH AEROBIC AND ANAEROBIC BOTTLES Organism ID to follow CRITICAL RESULT CALLED TO, READ BACK BY AND VERIFIED WITH: PARMD MIRANDA BRYK BY MESSAN H. AT 0552 02 7 27 2022    Culture (A)  Final    STAPHYLOCOCCUS AUREUS SUSCEPTIBILITIES TO FOLLOW Performed at [facility], [address] [place], [place] [address]    Report Status PENDING  Incomplete  Blood Culture ID Panel (Reflexed)     Status: Abnormal   Collection Time: 04/01/21 11:08 AM  Result Value Ref Range Status   Enterococcus faecalis NOT DETECTED NOT DETECTED Final   Enterococcus Faecium NOT DETECTED NOT DETECTED Final   Listeria monocytogenes NOT DETECTED NOT DETECTED Final   Staphylococcus species DETECTED (A) NOT DETECTED Final    Comment: CRITICAL RESULT CALLED TO, READ BACK BY AND VERIFIED WITH: PARMD MIRANDA BRYK BY MESSAN H. AT 0552 02 7 27 2022    Staphylococcus aureus (BCID) DETECTED (A) NOT DETECTED Final    Comment: Methicillin (oxacillin)-resistant Staphylococcus aureus (MRSA). MRSA is predictably resistant to beta-lactam antibiotics (except ceftaroline). Preferred therapy is vancomycin unless clinically contraindicated. Patient requires contact precautions if  hospitalized. CRITICAL RESULT CALLED TO, READ BACK BY AND VERIFIED WITH: PARMD MIRANDA BRYK BY MESSAN H. AT 0552 02 7 27 2022    Staphylococcus epidermidis NOT DETECTED NOT DETECTED Final   Staphylococcus lugdunensis NOT DETECTED NOT DETECTED Final   Streptococcus species NOT DETECTED NOT DETECTED Final   Streptococcus agalactiae NOT DETECTED NOT DETECTED Final   Streptococcus pneumoniae NOT DETECTED NOT DETECTED Final   Streptococcus pyogenes NOT DETECTED NOT DETECTED Final   A.calcoaceticus-baumannii NOT DETECTED NOT DETECTED Final    Bacteroides fragilis NOT DETECTED NOT DETECTED Final   Enterobacterales NOT DETECTED NOT DETECTED Final   Enterobacter cloacae complex NOT DETECTED NOT DETECTED Final   Escherichia coli NOT DETECTED NOT DETECTED Final   Klebsiella aerogenes NOT DETECTED NOT DETECTED Final   Klebsiella oxytoca NOT DETECTED NOT DETECTED Final   Klebsiella pneumoniae NOT DETECTED NOT DETECTED Final   Proteus species NOT DETECTED NOT DETECTED Final   Salmonella species NOT DETECTED NOT DETECTED   Final   Serratia marcescens NOT DETECTED NOT DETECTED Final   Haemophilus influenzae NOT DETECTED NOT DETECTED Final   Neisseria meningitidis NOT DETECTED NOT DETECTED Final   Pseudomonas aeruginosa NOT DETECTED NOT DETECTED Final   Stenotrophomonas maltophilia NOT DETECTED NOT DETECTED Final   Candida albicans NOT DETECTED NOT DETECTED Final   Candida auris NOT DETECTED NOT DETECTED Final   Candida glabrata NOT DETECTED NOT DETECTED Final   Candida krusei NOT DETECTED NOT DETECTED Final   Candida parapsilosis NOT DETECTED NOT DETECTED Final   Candida tropicalis NOT DETECTED NOT DETECTED Final   Cryptococcus neoformans/gattii NOT DETECTED NOT DETECTED Final   Meth resistant mecA/C and MREJ DETECTED (A) NOT DETECTED Final    Comment: CRITICAL RESULT CALLED TO, READ BACK BY AND VERIFIED WITH: PARMD MIRANDA BRYK BY MESSAN H. AT 0552 02 7 27 2022 Performed at [facility], [address] [place], [place] [address]   Culture, blood (Routine X 2) w Reflex to ID Panel     Status: Abnormal (Preliminary result)   Collection Time: 04/01/21 11:18 AM   Specimen: BLOOD RIGHT HAND  Result Value Ref Range Status   Specimen Description BLOOD RIGHT HAND  Final   Special Requests   Final    BOTTLES DRAWN AEROBIC ONLY Blood Culture adequate volume   Culture  Setup Time   Final    GRAM POSITIVE COCCI IN CLUSTERS AEROBIC BOTTLE ONLY CRITICAL VALUE NOTED.  VALUE IS CONSISTENT WITH PREVIOUSLY REPORTED AND CALLED  VALUE. Performed at [facility], [address] [place], [place] [address]    Culture STAPHYLOCOCCUS AUREUS (A)  Final   Report Status PENDING  Incomplete  Culture, blood (Routine X 2) w Reflex to ID Panel     Status: None (Preliminary result)   Collection Time: 04/02/21 11:50 PM   Specimen: BLOOD RIGHT HAND  Result Value Ref Range Status   Specimen Description BLOOD RIGHT HAND  Final   Special Requests   Final    BOTTLES DRAWN AEROBIC ONLY Blood Culture adequate volume   Culture   Final    NO GROWTH < 12 HOURS Performed at [facility], [address] [place], [place] [address]    Report Status PENDING  Incomplete  Culture, blood (Routine X 2) w Reflex to ID Panel     Status: None (Preliminary result)   Collection Time: 04/03/21 12:09 AM   Specimen: BLOOD LEFT HAND  Result Value Ref Range Status   Specimen Description BLOOD LEFT HAND  Final   Special Requests   Final    BOTTLES DRAWN AEROBIC ONLY Blood Culture adequate volume   Culture   Final    NO GROWTH < 12 HOURS Performed at [facility], [address] , [place] [address]    Report Status PENDING  Incomplete    Studies/Results: MR Lumbar Spine W Wo Contrast  Result Date: 04/03/2021 CLINICAL DATA:  Initial evaluation for intermittent left flank pain with radiation into the left lower back. EXAM: MRI LUMBAR SPINE WITHOUT AND WITH CONTRAST TECHNIQUE: Multiplanar and multiecho pulse sequences of the lumbar spine were obtained without and with intravenous contrast. CONTRAST:  8.5mL GADAVIST GADOBUTROL 1 MMOL/ML IV SOLN COMPARISON:  Prior CT from 03/31/2024. FINDINGS: Segmentation: Standard. Lowest well-formed disc space labeled the L5-S1 level. Alignment: Physiologic with preservation of the normal lumbar lordosis. No listhesis. Vertebrae: Vertebral body height maintained without acute or chronic fracture. Bone marrow signal intensity within normal limits. No   worrisome osseous lesions. Joint effusions  noted about the left greater than right L4-5 facets without significant marrow edema. No other abnormal marrow edema or enhancement to suggest osteomyelitis discitis. Conus medullaris and cauda equina: Conus extends to the L2 level. Conus and cauda equina appear normal. Paraspinal and other soft tissues: Diffuse edema and enhancement seen about the left greater than right lower posterior paraspinous musculature, most pronounced about the left L4-5 facet (series 6, image 12) small collection emanating from the left L4-5 facet as below. No other soft tissue collections. Mild scattered perinephric and retroperitoneal stranding noted, of uncertain significance, also seen on prior CT. Left periaortic lymph node measures 1 cm in short access, upper limits of normal. Visualized visceral structures otherwise unremarkable. Disc levels: L1-2: Negative interspace. Minimal facet spurring. No canal or foraminal stenosis. L2-3: Mild disc bulge with disc desiccation. Small biforaminal annular fissures noted. Mild facet hypertrophy with associated trace joint effusions. No significant spinal stenosis. Foramina remain patent. L3-4: Disc desiccation with mild annular disc bulge. Superimposed small left foraminal to extraforaminal disc protrusion closely approximates the exiting left L3 nerve root (series 8, image 23). Mild facet spurring with associated trace joint effusions. Tiny T2 hyperintense lesions measuring 4-5 mm situated posterior to the L3-4 facets noted, possibly reflecting small synovial cysts (series 8, image 27). These are not in a position to cause neural compromise. No spinal stenosis. Foramina remain patent. L4-5: Mild disc bulge with disc desiccation. Superimposed small left foraminal disc protrusion closely approximates and/or contacts the exiting left L4 nerve root (series 7, image 13). Moderate left worse than right facet hypertrophy with associated small joint effusions. There is a superimposed cystic  collection/lesion along the anteromedial aspect of the left L4-5 facet, measuring 1.2 x 0.5 x 1.3 cm (series 6, image 11). Lesion contacts and mildly displaces the descending left L5 nerve root in the left lateral recess with resultant mild left lateral recess stenosis. There is an additional T2 hyperintense lesion situated posterior to the L4-5 facet measuring 1.6 x 0.9 x 1.2 cm (series 8, image 33). These lesions demonstrate peripheral enhancement. Overall, findings are favored to reflect acute facet arthritis with associated synovial cysts. Possible septic arthritis with associated small collections difficult to exclude, and could be considered in the correct clinical setting. No other significant spinal stenosis. Mild left foraminal narrowing. Right neural foramina remains patent. L5-S1: Minimal disc bulge with disc desiccation. Small central annular fissure. Mild bilateral facet hypertrophy. No canal or lateral recess stenosis. Foramina remain patent. IMPRESSION: 1. Joint effusion with surrounding enhancement about the left L4-5 facet, with associated two cystic collections/lesions measuring up to 1.6 cm as above. 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 Possible septic arthritis with associated small collections difficult to exclude, and could be considered in the correct clinical setting. Secondary mass effect on the descending left L5 nerve root by one of these collections with resultant mild left lateral recess stenosis, which could contribute to left lower extremity symptoms. 2. Abnormal edema and enhancement within the left greater than right lower posterior paraspinous musculature, greatest about the left L4-5 facet. Findings suggestive of acute myositis, which could be either infectious or inflammatory in nature. Possible muscular injury/strain could also be considered in the correct clinical setting. 3. Additional small left foraminal to  extraforaminal disc protrusions at L3-4 and L4-5, potentially affecting the exiting left L3 and L4 nerve roots respectively. 4. No other evidence for acute infection within the lumbar   spine. Electronically Signed   By: Benjamin  McClintock M.D.   On: 04/03/2021 02:25   ECHOCARDIOGRAM COMPLETE  Result Date: 04/02/2021    ECHOCARDIOGRAM REPORT   Patient Name:   Katie Serrano Date of Exam: 04/02/2021 Medical Rec #:  [id number]       Height:       66.0 in Accession #:    [id number]      Weight:       185.0 lb Date of Birth:  09/27/1955       BSA:          1.934 m Patient Age:    64 years        BP:           161/89 mmHg Patient Gender: F               HR:           88 bpm. Exam Location:  Inpatient Procedure: 2D Echo, Cardiac Doppler and Color Doppler Indications:    Bacteremia  History:        Patient has no prior history of Echocardiogram examinations.                 CAD, COPD, Signs/Symptoms:Fever, UTI, Fatigue and Bacteremia;                 Risk Factors:Hypertension, Dyslipidemia, Diabetes, Current                 Smoker and Obesity.  Sonographer:    Brooke Strickland Referring Phys: [id number] ANKIT CHIRAG AMIN  Sonographer Comments: Technically difficult study due to poor echo windows and patient is morbidly obese. IMPRESSIONS  1. Left ventricular ejection fraction, by estimation, is 60 to 65%. The left ventricle has normal function. The left ventricle has no regional wall motion abnormalities. There is mild left ventricular hypertrophy. Left ventricular diastolic parameters were normal.  2. Right ventricular systolic function is normal. The right ventricular size is normal. There is normal pulmonary artery systolic pressure. The estimated right ventricular systolic pressure is 25.7 mmHg.  3. The mitral valve is normal in structure. Trivial mitral valve regurgitation. No evidence of mitral stenosis.  4. The aortic valve is tricuspid. Aortic valve regurgitation is not visualized. No aortic stenosis is present.   5. The inferior vena cava is normal in size with greater than 50% respiratory variability, suggesting right atrial pressure of 3 mmHg. Conclusion(s)/Recommendation(s): No vegetation seen. If high clinical suspicion for endocarditis, recommend TEE. FINDINGS  Left Ventricle: Left ventricular ejection fraction, by estimation, is 60 to 65%. The left ventricle has normal function. The left ventricle has no regional wall motion abnormalities. The left ventricular internal cavity size was normal in size. There is  mild left ventricular hypertrophy. Left ventricular diastolic parameters were normal. Right Ventricle: The right ventricular size is normal. No increase in right ventricular wall thickness. Right ventricular systolic function is normal. There is normal pulmonary artery systolic pressure. The tricuspid regurgitant velocity is 2.38 m/s, and  with an assumed right atrial pressure of 3 mmHg, the estimated right ventricular systolic pressure is 25.7 mmHg. Left Atrium: Left atrial size was normal in size. Right Atrium: Right atrial size was normal in size. Pericardium: Trivial pericardial effusion is present. Presence of pericardial fat pad. Mitral Valve: The mitral valve is normal in structure. Trivial mitral valve regurgitation. No evidence of mitral valve stenosis. Tricuspid Valve: The tricuspid valve is normal in structure. Tricuspid valve regurgitation is trivial. Aortic Valve:   The aortic valve is tricuspid. Aortic valve regurgitation is not visualized. No aortic stenosis is present. Pulmonic Valve: The pulmonic valve was not well visualized. Pulmonic valve regurgitation is not visualized. Aorta: The aortic root and ascending aorta are structurally normal, with no evidence of dilitation. Venous: The inferior vena cava is normal in size with greater than 50% respiratory variability, suggesting right atrial pressure of 3 mmHg. IAS/Shunts: The interatrial septum was not well visualized.  LEFT VENTRICLE PLAX 2D LVIDd:          4.60 cm  Diastology LVIDs:         2.80 cm  LV e' medial:    8.92 cm/s LV PW:         1.00 cm  LV E/e' medial:  10.4 LV IVS:        1.00 cm  LV e' lateral:   7.29 cm/s LVOT diam:     2.10 cm  LV E/e' lateral: 12.7 LV SV:         70 LV SV Index:   36 LVOT Area:     3.46 cm  RIGHT VENTRICLE RV Basal diam:  2.80 cm RV S prime:     7.83 cm/s TAPSE (M-mode): 1.4 cm LEFT ATRIUM             Index       RIGHT ATRIUM           Index LA diam:        3.40 cm 1.76 cm/m  RA Area:     11.40 cm LA Vol (A2C):   28.6 ml 14.78 ml/m RA Volume:   26.50 ml  13.70 ml/m LA Vol (A4C):   31.9 ml 16.49 ml/m LA Biplane Vol: 30.7 ml 15.87 ml/m  AORTIC VALVE LVOT Vmax:   98.90 cm/s LVOT Vmean:  67.600 cm/s LVOT VTI:    0.202 m  AORTA Ao Root diam: 2.80 cm MITRAL VALVE               TRICUSPID VALVE MV Area (PHT): 3.56 cm    TR Peak grad:   22.7 mmHg MV Decel Time: 213 msec    TR Vmax:        238.00 cm/s MV E velocity: 92.70 cm/s MV A velocity: 77.10 cm/s  SHUNTS MV E/A ratio:  1.20        Systemic VTI:  0.20 m                            Systemic Diam: 2.10 cm Christopher Schumann MD Electronically signed by Christopher Schumann MD Signature Date/Time: 04/02/2021/1:14:35 PM    Final       Assessment/Plan:  INTERVAL HISTORY:  Patient had MRI of the lumbar spine completed which showed joint effusion around L4-L5 facets.  2 fluid collections were noted measuring up to 1.6 cm.  Felt to be related to acute facet arthritis though septic arthritis cannot be excluded.  Patient on left L5 nerve root.  Edema also noted in the left greater than the right lower posterior paraspinous muscles.  Which is thought to be related to infectious versus inflammatory myositis.  Neurosurgery was consulted and reviewed imaging.  They noted that the cystic lesions were not able to be intervened upon.  Patient has remained afebrile and her leukocytosis is improving.  She continues to have significant lower extremity weakness right greater than left.   Patient's initial blood cultures (3 out of 4) grew   MRSA.  Blood cultures 7/28 are no growth at 12 hours.  Patient's TEE is planned for 7/29.  Principal Problem:   SIRS (systemic inflammatory response syndrome) (HCC) Active Problems:   Class 1 obesity due to excess calories with serious comorbidity and body mass index (BMI) of 30.0 to 30.9 in adult   Essential hypertension   Mixed hyperlipidemia   Coronary artery disease of native artery of native heart with stable angina pectoris (HCC)   Type 2 diabetes mellitus with hyperglycemia (HCC)   Hyponatremia   Acute pyelonephritis   MRSA bacteremia   Epidural abscess   Weakness of both lower extremities    Cartier F Allshouse is a 64 y.o. female with past medical history of obesity, hypertension, asthma, type 2 diabetes here with MRSA bacteremia.  Unclear source.  TEE to be done 7/29.  Continue vancomycin for now.    LOS: 2 days   [place] [facility] PGY-2  04/03/2021, 2:41 PM  

## 2021-04-03 NOTE — Progress Notes (Signed)
Subjective: Persistent lower extremity weakness. No new complaints   Antibiotics:  Anti-infectives (From admission, onward)    Start     Dose/Rate Route Frequency Ordered Stop   04/03/21 0900  vancomycin (VANCOREADY) IVPB 1500 mg/300 mL        1,500 mg 150 mL/hr over 120 Minutes Intravenous Every 24 hours 04/02/21 0703     04/02/21 0800  vancomycin (VANCOREADY) IVPB 2000 mg/400 mL        2,000 mg 200 mL/hr over 120 Minutes Intravenous  Once 04/02/21 0703 04/02/21 1046   04/01/21 1230  ampicillin (OMNIPEN) 1 g in sodium chloride 0.9 % 100 mL IVPB  Status:  Discontinued        1 g 300 mL/hr over 20 Minutes Intravenous Every 6 hours 04/01/21 1137 04/02/21 0655   04/01/21 0300  cefTRIAXone (ROCEPHIN) 1 g in sodium chloride 0.9 % 100 mL IVPB  Status:  Discontinued        1 g 200 mL/hr over 30 Minutes Intravenous Every 24 hours 03/31/21 0653 04/02/21 0844   03/31/21 0300  cefTRIAXone (ROCEPHIN) 1 g in sodium chloride 0.9 % 100 mL IVPB        1 g 200 mL/hr over 30 Minutes Intravenous  Once 03/31/21 0250 03/31/21 0430       Medications: Scheduled Meds:  ALPRAZolam  0.5 mg Oral Once   amLODipine  5 mg Oral Daily   aspirin  81 mg Oral Daily   enoxaparin (LOVENOX) injection  40 mg Subcutaneous Q24H   insulin aspart  0-15 Units Subcutaneous TID WC   insulin aspart  0-5 Units Subcutaneous QHS   insulin glargine-yfgn  10 Units Subcutaneous Daily   losartan  50 mg Oral QHS   metoprolol tartrate  25 mg Oral BID   multivitamin with minerals  1 tablet Oral Daily   omega-3 acid ethyl esters  1 g Oral Daily   Continuous Infusions:  vancomycin 1,500 mg (04/03/21 0950)   PRN Meds:.acetaminophen, dextromethorphan-guaiFENesin, hydrALAZINE, ipratropium-albuterol, nitroGLYCERIN, ondansetron **OR** ondansetron (ZOFRAN) IV, oxyCODONE, senna-docusate    Objective: Weight change:   Intake/Output Summary (Last 24 hours) at 04/03/2021 1441 Last data filed at 04/03/2021 0700 Gross per  24 hour  Intake 960 ml  Output 2650 ml  Net -1690 ml   Blood pressure 134/70, pulse 68, temperature 99.1 F (37.3 C), temperature source Oral, resp. rate 18, height 5\' 6"  (1.676 m), weight 83.9 kg, SpO2 93 %. Temp:  [98 F (36.7 C)-99.8 F (37.7 C)] 99.1 F (37.3 C) (07/28 1145) Pulse Rate:  [68-88] 68 (07/28 1145) Resp:  [16-18] 18 (07/28 1145) BP: (133-158)/(70-76) 134/70 (07/28 1145) SpO2:  [93 %-97 %] 93 % (07/28 1145)  Physical Exam: Physical Exam Constitutional:      General: She is not in acute distress.    Appearance: Normal appearance. She is not ill-appearing.  HENT:     Head: Normocephalic and atraumatic.  Eyes:     General: No scleral icterus.       Left eye: No discharge.  Cardiovascular:     Rate and Rhythm: Normal rate and regular rhythm.  Pulmonary:     Effort: Pulmonary effort is normal.     Breath sounds: Normal breath sounds.  Abdominal:     General: There is no distension.     Tenderness: There is no abdominal tenderness.  Musculoskeletal:        General: No swelling or tenderness.  Neurological:  Mental Status: She is alert.     CBC:    BMET Recent Labs    04/02/21 0353 04/03/21 0015  NA 131* 133*  K 3.5 3.3*  CL 98 101  CO2 24 25  GLUCOSE 207* 179*  BUN 15 13  CREATININE 0.87 0.88  CALCIUM 8.4* 8.7*     Liver Panel  No results for input(s): PROT, ALBUMIN, AST, ALT, ALKPHOS, BILITOT, BILIDIR, IBILI in the last 72 hours.     Sedimentation Rate No results for input(s): ESRSEDRATE in the last 72 hours. C-Reactive Protein No results for input(s): CRP in the last 72 hours.  Micro Results: Recent Results (from the past 720 hour(s))  Resp Panel by RT-PCR (Flu A&B, Covid) Nasopharyngeal Swab     Status: None   Collection Time: 03/31/21 12:21 AM   Specimen: Nasopharyngeal Swab; Nasopharyngeal(NP) swabs in vial transport medium  Result Value Ref Range Status   SARS Coronavirus 2 by RT PCR NEGATIVE NEGATIVE Final    Comment:  (NOTE) SARS-CoV-2 target nucleic acids are NOT DETECTED.  The SARS-CoV-2 RNA is generally detectable in upper respiratory specimens during the acute phase of infection. The lowest concentration of SARS-CoV-2 viral copies this assay can detect is 138 copies/mL. A negative result does not preclude SARS-Cov-2 infection and should not be used as the sole basis for treatment or other patient management decisions. A negative result may occur with  improper specimen collection/handling, submission of specimen other than nasopharyngeal swab, presence of viral mutation(s) within the areas targeted by this assay, and inadequate number of viral copies(<138 copies/mL). A negative result must be combined with clinical observations, patient history, and epidemiological information. The expected result is Negative.  Fact Sheet for Patients:  BloggerCourse.com  Fact Sheet for Healthcare Providers:  SeriousBroker.it  This test is no t yet approved or cleared by the [place] FDA and  has been authorized for detection and/or diagnosis of SARS-CoV-2 by FDA under an Emergency Use Authorization (EUA). This EUA will remain  in effect (meaning this test can be used) for the duration of the COVID-19 declaration under Section 564(b)(1) of the Act, 21 U.S.C.section 360bbb-3(b)(1), unless the authorization is terminated  or revoked sooner.       Influenza A by PCR NEGATIVE NEGATIVE Final   Influenza B by PCR NEGATIVE NEGATIVE Final    Comment: (NOTE) The Xpert Xpress SARS-CoV-2/FLU/RSV plus assay is intended as an aid in the diagnosis of influenza from Nasopharyngeal swab specimens and should not be used as a sole basis for treatment. Nasal washings and aspirates are unacceptable for Xpert Xpress SARS-CoV-2/FLU/RSV testing.  Fact Sheet for Patients: BloggerCourse.com  Fact Sheet for Healthcare  Providers: SeriousBroker.it  This test is not yet approved or cleared by the [place] FDA and has been authorized for detection and/or diagnosis of SARS-CoV-2 by FDA under an Emergency Use Authorization (EUA). This EUA will remain in effect (meaning this test can be used) for the duration of the COVID-19 declaration under Section 564(b)(1) of the Act, 21 U.S.C. section 360bbb-3(b)(1), unless the authorization is terminated or revoked.  Performed at [facility], [address], [place], [place] [address]   Urine Culture     Status: Abnormal   Collection Time: 03/31/21  5:51 AM   Specimen: Urine, Clean Catch  Result Value Ref Range Status   Specimen Description URINE, CLEAN CATCH  Final   Special Requests NONE  Final   Culture (A)  Final    >=100,000 COLONIES/mL METHICILLIN  RESISTANT STAPHYLOCOCCUS AUREUS 40,000 COLONIES/mL AEROCOCCUS SPECIES Standardized susceptibility testing for this organism is not available. Performed at [facility], [address] [place], [place] [address]    Report Status 04/02/2021 FINAL  Final   Organism ID, Bacteria METHICILLIN RESISTANT STAPHYLOCOCCUS AUREUS (A)  Final      Susceptibility   Methicillin resistant staphylococcus aureus - MIC*    CIPROFLOXACIN >=8 RESISTANT Resistant     GENTAMICIN <=0.5 SENSITIVE Sensitive     NITROFURANTOIN <=16 SENSITIVE Sensitive     OXACILLIN >=4 RESISTANT Resistant     TETRACYCLINE <=1 SENSITIVE Sensitive     VANCOMYCIN 1 SENSITIVE Sensitive     TRIMETH/SULFA <=10 SENSITIVE Sensitive     CLINDAMYCIN <=0.25 SENSITIVE Sensitive     RIFAMPIN <=0.5 SENSITIVE Sensitive     Inducible Clindamycin NEGATIVE Sensitive     * >=100,000 COLONIES/mL METHICILLIN RESISTANT STAPHYLOCOCCUS AUREUS  Culture, blood (Routine X 2) w Reflex to ID Panel     Status: Abnormal (Preliminary result)   Collection Time: 04/01/21 11:08 AM   Specimen: BLOOD  Result Value Ref Range Status   Specimen  Description BLOOD LEFT ANTECUBITAL  Final   Special Requests   Final    BOTTLES DRAWN AEROBIC AND ANAEROBIC Blood Culture adequate volume   Culture  Setup Time   Final    GRAM POSITIVE COCCI IN CLUSTERS IN BOTH AEROBIC AND ANAEROBIC BOTTLES Organism ID to follow CRITICAL RESULT CALLED TO, READ BACK BY AND VERIFIED WITH: PARMD MIRANDA BRYK BY MESSAN H. AT 0552 02 7 27 2022    Culture (A)  Final    STAPHYLOCOCCUS AUREUS SUSCEPTIBILITIES TO FOLLOW Performed at [facility], [address], [place], [place] [address]    Report Status PENDING  Incomplete  Blood Culture ID Panel (Reflexed)     Status: Abnormal   Collection Time: 04/01/21 11:08 AM  Result Value Ref Range Status   Enterococcus faecalis NOT DETECTED NOT DETECTED Final   Enterococcus Faecium NOT DETECTED NOT DETECTED Final   Listeria monocytogenes NOT DETECTED NOT DETECTED Final   Staphylococcus species DETECTED (A) NOT DETECTED Final    Comment: CRITICAL RESULT CALLED TO, READ BACK BY AND VERIFIED WITH: PARMD MIRANDA BRYK BY MESSAN H. AT 0552 02 7 27 2022    Staphylococcus aureus (BCID) DETECTED (A) NOT DETECTED Final    Comment: Methicillin (oxacillin)-resistant Staphylococcus aureus (MRSA). MRSA is predictably resistant to beta-lactam antibiotics (except ceftaroline). Preferred therapy is vancomycin unless clinically contraindicated. Patient requires contact precautions if  hospitalized. CRITICAL RESULT CALLED TO, READ BACK BY AND VERIFIED WITH: PARMD MIRANDA BRYK BY MESSAN H. AT 0552 02 7 27 2022    Staphylococcus epidermidis NOT DETECTED NOT DETECTED Final   Staphylococcus lugdunensis NOT DETECTED NOT DETECTED Final   Streptococcus species NOT DETECTED NOT DETECTED Final   Streptococcus agalactiae NOT DETECTED NOT DETECTED Final   Streptococcus pneumoniae NOT DETECTED NOT DETECTED Final   Streptococcus pyogenes NOT DETECTED NOT DETECTED Final   A.calcoaceticus-baumannii NOT DETECTED NOT DETECTED Final    Bacteroides fragilis NOT DETECTED NOT DETECTED Final   Enterobacterales NOT DETECTED NOT DETECTED Final   Enterobacter cloacae complex NOT DETECTED NOT DETECTED Final   Escherichia coli NOT DETECTED NOT DETECTED Final   Klebsiella aerogenes NOT DETECTED NOT DETECTED Final   Klebsiella oxytoca NOT DETECTED NOT DETECTED Final   Klebsiella pneumoniae NOT DETECTED NOT DETECTED Final   Proteus species NOT DETECTED NOT DETECTED Final   Salmonella species NOT DETECTED NOT DETECTED  Final   Serratia marcescens NOT DETECTED NOT DETECTED Final   Haemophilus influenzae NOT DETECTED NOT DETECTED Final   Neisseria meningitidis NOT DETECTED NOT DETECTED Final   Pseudomonas aeruginosa NOT DETECTED NOT DETECTED Final   Stenotrophomonas maltophilia NOT DETECTED NOT DETECTED Final   Candida albicans NOT DETECTED NOT DETECTED Final   Candida auris NOT DETECTED NOT DETECTED Final   Candida glabrata NOT DETECTED NOT DETECTED Final   Candida krusei NOT DETECTED NOT DETECTED Final   Candida parapsilosis NOT DETECTED NOT DETECTED Final   Candida tropicalis NOT DETECTED NOT DETECTED Final   Cryptococcus neoformans/gattii NOT DETECTED NOT DETECTED Final   Meth resistant mecA/C and MREJ DETECTED (A) NOT DETECTED Final    Comment: CRITICAL RESULT CALLED TO, READ BACK BY AND VERIFIED WITH: PARMD MIRANDA BRYK BY MESSAN H. AT 0552 02 7 27 2022 Performed at [facility], [address] [place], [place] [address]   Culture, blood (Routine X 2) w Reflex to ID Panel     Status: Abnormal (Preliminary result)   Collection Time: 04/01/21 11:18 AM   Specimen: BLOOD RIGHT HAND  Result Value Ref Range Status   Specimen Description BLOOD RIGHT HAND  Final   Special Requests   Final    BOTTLES DRAWN AEROBIC ONLY Blood Culture adequate volume   Culture  Setup Time   Final    GRAM POSITIVE COCCI IN CLUSTERS AEROBIC BOTTLE ONLY CRITICAL VALUE NOTED.  VALUE IS CONSISTENT WITH PREVIOUSLY REPORTED AND CALLED  VALUE. Performed at [facility], [address], [place], [place] [address]    Culture STAPHYLOCOCCUS AUREUS (A)  Final   Report Status PENDING  Incomplete  Culture, blood (Routine X 2) w Reflex to ID Panel     Status: None (Preliminary result)   Collection Time: 04/02/21 11:50 PM   Specimen: BLOOD RIGHT HAND  Result Value Ref Range Status   Specimen Description BLOOD RIGHT HAND  Final   Special Requests   Final    BOTTLES DRAWN AEROBIC ONLY Blood Culture adequate volume   Culture   Final    NO GROWTH < 12 HOURS Performed at [facility], [address], [place], [place] [address]    Report Status PENDING  Incomplete  Culture, blood (Routine X 2) w Reflex to ID Panel     Status: None (Preliminary result)   Collection Time: 04/03/21 12:09 AM   Specimen: BLOOD LEFT HAND  Result Value Ref Range Status   Specimen Description BLOOD LEFT HAND  Final   Special Requests   Final    BOTTLES DRAWN AEROBIC ONLY Blood Culture adequate volume   Culture   Final    NO GROWTH < 12 HOURS Performed at [facility], [address] [place], [place] [address]    Report Status PENDING  Incomplete    Studies/Results: MR Lumbar Spine W Wo Contrast  Result Date: 04/03/2021 CLINICAL DATA:  Initial evaluation for intermittent left flank pain with radiation into the left lower back. EXAM: MRI LUMBAR SPINE WITHOUT AND WITH CONTRAST TECHNIQUE: Multiplanar and multiecho pulse sequences of the lumbar spine were obtained without and with intravenous contrast. CONTRAST:  8.36mL GADAVIST GADOBUTROL 1 MMOL/ML IV SOLN COMPARISON:  Prior CT from 03/31/2024. FINDINGS: Segmentation: Standard. Lowest well-formed disc space labeled the L5-S1 level. Alignment: Physiologic with preservation of the normal lumbar lordosis. No listhesis. Vertebrae: Vertebral body height maintained without acute or chronic fracture. Bone marrow signal intensity within normal limits. No  worrisome osseous lesions. Joint effusions  noted about the left greater than right L4-5 facets without significant marrow edema. No other abnormal marrow edema or enhancement to suggest osteomyelitis discitis. Conus medullaris and cauda equina: Conus extends to the L2 level. Conus and cauda equina appear normal. Paraspinal and other soft tissues: Diffuse edema and enhancement seen about the left greater than right lower posterior paraspinous musculature, most pronounced about the left L4-5 facet (series 6, image 12) small collection emanating from the left L4-5 facet as below. No other soft tissue collections. Mild scattered perinephric and retroperitoneal stranding noted, of uncertain significance, also seen on prior CT. Left periaortic lymph node measures 1 cm in short access, upper limits of normal. Visualized visceral structures otherwise unremarkable. Disc levels: L1-2: Negative interspace. Minimal facet spurring. No canal or foraminal stenosis. L2-3: Mild disc bulge with disc desiccation. Small biforaminal annular fissures noted. Mild facet hypertrophy with associated trace joint effusions. No significant spinal stenosis. Foramina remain patent. L3-4: Disc desiccation with mild annular disc bulge. Superimposed small left foraminal to extraforaminal disc protrusion closely approximates the exiting left L3 nerve root (series 8, image 23). Mild facet spurring with associated trace joint effusions. Tiny T2 hyperintense lesions measuring 4-5 mm situated posterior to the L3-4 facets noted, possibly reflecting small synovial cysts (series 8, image 27). These are not in a position to cause neural compromise. No spinal stenosis. Foramina remain patent. L4-5: Mild disc bulge with disc desiccation. Superimposed small left foraminal disc protrusion closely approximates and/or contacts the exiting left L4 nerve root (series 7, image 13). Moderate left worse than right facet hypertrophy with associated small joint effusions. There is a superimposed cystic  collection/lesion along the anteromedial aspect of the left L4-5 facet, measuring 1.2 x 0.5 x 1.3 cm (series 6, image 11). Lesion contacts and mildly displaces the descending left L5 nerve root in the left lateral recess with resultant mild left lateral recess stenosis. There is an additional T2 hyperintense lesion situated posterior to the L4-5 facet measuring 1.6 x 0.9 x 1.2 cm (series 8, image 33). These lesions demonstrate peripheral enhancement. Overall, findings are favored to reflect acute facet arthritis with associated synovial cysts. Possible septic arthritis with associated small collections difficult to exclude, and could be considered in the correct clinical setting. No other significant spinal stenosis. Mild left foraminal narrowing. Right neural foramina remains patent. L5-S1: Minimal disc bulge with disc desiccation. Small central annular fissure. Mild bilateral facet hypertrophy. No canal or lateral recess stenosis. Foramina remain patent. IMPRESSION: 1. Joint effusion with surrounding enhancement about the left L4-5 facet, with associated two cystic collections/lesions measuring up to 1.6 cm as above. 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 Possible septic arthritis with associated small collections difficult to exclude, and could be considered in the correct clinical setting. Secondary mass effect on the descending left L5 nerve root by one of these collections with resultant mild left lateral recess stenosis, which could contribute to left lower extremity symptoms. 2. Abnormal edema and enhancement within the left greater than right lower posterior paraspinous musculature, greatest about the left L4-5 facet. Findings suggestive of acute myositis, which could be either infectious or inflammatory in nature. Possible muscular injury/strain could also be considered in the correct clinical setting. 3. Additional small left foraminal to  extraforaminal disc protrusions at L3-4 and L4-5, potentially affecting the exiting left L3 and L4 nerve roots respectively. 4. No other evidence for acute infection within the lumbar  spine. Electronically Signed   By: Rise MuBenjamin  McClintock M.D.   On: 04/03/2021 02:25   ECHOCARDIOGRAM COMPLETE  Result Date: 04/02/2021    ECHOCARDIOGRAM REPORT   Patient Name:   Katie Serrano Date of Exam: 04/02/2021 Medical Rec #:  [id number]       Height:       66.0 in Accession #:    [id number]      Weight:       185.0 lb Date of Birth:  05/11/1956       BSA:          1.934 m Patient Age:    64 years        BP:           161/89 mmHg Patient Gender: F               HR:           88 bpm. Exam Location:  Inpatient Procedure: 2D Echo, Cardiac Doppler and Color Doppler Indications:    Bacteremia  History:        Patient has no prior history of Echocardiogram examinations.                 CAD, COPD, Signs/Symptoms:Fever, UTI, Fatigue and Bacteremia;                 Risk Factors:Hypertension, Dyslipidemia, Diabetes, Current                 Smoker and Obesity.  Sonographer:    Lavenia AtlasBrooke Strickland Referring Phys: [id number] [facility]  Sonographer Comments: Technically difficult study due to poor echo windows and patient is morbidly obese. IMPRESSIONS  1. Left ventricular ejection fraction, by estimation, is 60 to 65%. The left ventricle has normal function. The left ventricle has no regional wall motion abnormalities. There is mild left ventricular hypertrophy. Left ventricular diastolic parameters were normal.  2. Right ventricular systolic function is normal. The right ventricular size is normal. There is normal pulmonary artery systolic pressure. The estimated right ventricular systolic pressure is 25.7 mmHg.  3. The mitral valve is normal in structure. Trivial mitral valve regurgitation. No evidence of mitral stenosis.  4. The aortic valve is tricuspid. Aortic valve regurgitation is not visualized. No aortic stenosis is present.   5. The inferior vena cava is normal in size with greater than 50% respiratory variability, suggesting right atrial pressure of 3 mmHg. Conclusion(s)/Recommendation(s): No vegetation seen. If high clinical suspicion for endocarditis, recommend TEE. FINDINGS  Left Ventricle: Left ventricular ejection fraction, by estimation, is 60 to 65%. The left ventricle has normal function. The left ventricle has no regional wall motion abnormalities. The left ventricular internal cavity size was normal in size. There is  mild left ventricular hypertrophy. Left ventricular diastolic parameters were normal. Right Ventricle: The right ventricular size is normal. No increase in right ventricular wall thickness. Right ventricular systolic function is normal. There is normal pulmonary artery systolic pressure. The tricuspid regurgitant velocity is 2.38 m/s, and  with an assumed right atrial pressure of 3 mmHg, the estimated right ventricular systolic pressure is 25.7 mmHg. Left Atrium: Left atrial size was normal in size. Right Atrium: Right atrial size was normal in size. Pericardium: Trivial pericardial effusion is present. Presence of pericardial fat pad. Mitral Valve: The mitral valve is normal in structure. Trivial mitral valve regurgitation. No evidence of mitral valve stenosis. Tricuspid Valve: The tricuspid valve is normal in structure. Tricuspid valve regurgitation is trivial. Aortic Valve:  The aortic valve is tricuspid. Aortic valve regurgitation is not visualized. No aortic stenosis is present. Pulmonic Valve: The pulmonic valve was not well visualized. Pulmonic valve regurgitation is not visualized. Aorta: The aortic root and ascending aorta are structurally normal, with no evidence of dilitation. Venous: The inferior vena cava is normal in size with greater than 50% respiratory variability, suggesting right atrial pressure of 3 mmHg. IAS/Shunts: The interatrial septum was not well visualized.  LEFT VENTRICLE PLAX 2D LVIDd:          4.60 cm  Diastology LVIDs:         2.80 cm  LV e' medial:    8.92 cm/s LV PW:         1.00 cm  LV E/e' medial:  10.4 LV IVS:        1.00 cm  LV e' lateral:   7.29 cm/s LVOT diam:     2.10 cm  LV E/e' lateral: 12.7 LV SV:         70 LV SV Index:   36 LVOT Area:     3.46 cm  RIGHT VENTRICLE RV Basal diam:  2.80 cm RV S prime:     7.83 cm/s TAPSE (M-mode): 1.4 cm LEFT ATRIUM             Index       RIGHT ATRIUM           Index LA diam:        3.40 cm 1.76 cm/m  RA Area:     11.40 cm LA Vol (A2C):   28.6 ml 14.78 ml/m RA Volume:   26.50 ml  13.70 ml/m LA Vol (A4C):   31.9 ml 16.49 ml/m LA Biplane Vol: 30.7 ml 15.87 ml/m  AORTIC VALVE LVOT Vmax:   98.90 cm/s LVOT Vmean:  67.600 cm/s LVOT VTI:    0.202 m  AORTA Ao Root diam: 2.80 cm MITRAL VALVE               TRICUSPID VALVE MV Area (PHT): 3.56 cm    TR Peak grad:   22.7 mmHg MV Decel Time: 213 msec    TR Vmax:        238.00 cm/s MV E velocity: 92.70 cm/s MV A velocity: 77.10 cm/s  SHUNTS MV E/A ratio:  1.20        Systemic VTI:  0.20 m                            Systemic Diam: 2.10 cm Epifanio Lesches MD Electronically signed by Epifanio Lesches MD Signature Date/Time: 04/02/2021/1:14:35 PM    Final       Assessment/Plan:  INTERVAL HISTORY:  Patient had MRI of the lumbar spine completed which showed joint effusion around L4-L5 facets.  2 fluid collections were noted measuring up to 1.6 cm.  Felt to be related to acute facet arthritis though septic arthritis cannot be excluded.  Patient on left L5 nerve root.  Edema also noted in the left greater than the right lower posterior paraspinous muscles.  Which is thought to be related to infectious versus inflammatory myositis.  Neurosurgery was consulted and reviewed imaging.  They noted that the cystic lesions were not able to be intervened upon.  Patient has remained afebrile and her leukocytosis is improving.  She continues to have significant lower extremity weakness right greater than left.   Patient's initial blood cultures (3 out of 4) grew  MRSA.  Blood cultures 7/28 are no growth at 12 hours.  Patient's TEE is planned for 7/29.  Principal Problem:   SIRS (systemic inflammatory response syndrome) (HCC) Active Problems:   Class 1 obesity due to excess calories with serious comorbidity and body mass index (BMI) of 30.0 to 30.9 in adult   Essential hypertension   Mixed hyperlipidemia   Coronary artery disease of native artery of native heart with stable angina pectoris (HCC)   Type 2 diabetes mellitus with hyperglycemia (HCC)   Hyponatremia   Acute pyelonephritis   MRSA bacteremia   Epidural abscess   Weakness of both lower extremities    Katie Serrano is a 65 y.o. female with past medical history of obesity, hypertension, asthma, type 2 diabetes here with MRSA bacteremia.  Unclear source.  TEE to be done 7/29.  Continue vancomycin for now.    LOS: 2 days   Marolyn Haller Internal Medicine PGY-2  04/03/2021, 2:41 PM

## 2021-04-03 NOTE — Consult Note (Signed)
   Providing Compassionate, Quality Care - Together  Neurosurgery Consult  Referring physician: Dr. Nelson Chimes Reason for referral: Low back pain, lumbar facet infection  Chief Complaint: Low back pain, lower extremity weakness  History of Present Illness: This is a 65 year old female that presented to the hospital after waking up with bilateral lower extremity weakness and complaints of back pain.  She has a history of diabetes, somewhat uncontrolled per the patient.  She was admitted the hospital due to SIRS and found to have positive blood cultures with MRSA.  At this time she complains of bilateral low back pain.  She also complains of lower extremity weakness, with painful range of motion in her bilateral, right greater than left knees.  She denies any numbness tingling or bowel or bladder changes.  She denies any groin numbness whatsoever.  She denies any incontinence.   Medications: I have reviewed the patient's current medications. Allergies: No Known Allergies  History reviewed. No pertinent family history. Social History:  has no history on file for tobacco use, alcohol use, and drug use.  ROS: All pertinent positives negatives listed in HPI above.  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  MRI reviewed, there is  no drainable abscess nor significant neural compression. -Recommended aggressive medical management and infectious disease recommendations for antibiotics -Recommend susceptibilities for adjustment of antibiotics -Pain control -Please call with questions or concerns.  Will sign off at this time.   Thank you for allowing me to participate in this patient's care.  Please do not hesitate to call with questions or concerns.   Monia Pouch, DO Neurosurgeon [facility] Cell: [phone]

## 2021-04-03 NOTE — Progress Notes (Signed)
    [facility] has been requested to perform a transesophageal echocardiogram on Katie Serrano for bacteremia.  After careful review of history and examination, the risks and benefits of transesophageal echocardiogram have been explained including risks of esophageal damage, perforation (1:10,000 risk), bleeding, pharyngeal hematoma as well as other potential complications associated with conscious sedation including aspiration, arrhythmia, respiratory failure and death. Alternatives to treatment were discussed, questions were answered. Patient is willing to proceed.   Georgie Chard, NP  04/03/2021 8:31 AM

## 2021-04-03 NOTE — Progress Notes (Signed)
PT Cancellation Note  Patient Details Name: Katie Serrano MRN: [id number] DOB: [birth year]   Cancelled Treatment:    Reason Eval/Treat Not Completed: Patient at procedure or test/unavailable  Patient eating lunch. Asked PT to return. Will attempt to see today as schedules permit.   Jerolyn Center, PT Pager [phone]  [name] 04/03/2021, 11:32 AM

## 2021-04-03 NOTE — TOC Initial Note (Signed)
Transition of Care [facility]) - Initial/Assessment Note    Patient Details  Name: Katie Serrano MRN: [id number] Date of Birth: [birth year]  Transition of Care [facility]) CM/SW Contact:    Lockie Pares, RN Phone Number: 04/03/2021, 2:49 PM  Clinical Narrative:                  Patient admitted with SIRS,  Will need IV abx athome, PICC ordered, Pam with [employer] aware and will do education with patient and family. Frances Furbish will provide RN , patient lives in [place].   Expected Discharge Plan: Home w Home Health Services Barriers to Discharge: Continued Medical Work up   Patient Goals and CMS Choice        Expected Discharge Plan and Services Expected Discharge Plan: Home w Home Health Services     Post Acute Care Choice: Home Health Living arrangements for the past 2 months: Single Family Home                           HH Arranged: RN HH Agency: [facility] Date [facility] Agency Contacted: 04/03/21 Time HH Agency Contacted: 1100 Representative spoke with at [facility] Agency: Denyse Amass from [employer] at [employer]  Prior Living Arrangements/Services Living arrangements for the past 2 months: Single Family Home Lives with:: Spouse Patient language and need for interpreter reviewed:: Yes        Need for Family Participation in Patient Care: Yes (Comment) Care giver support system in place?: Yes (comment)   Criminal Activity/Legal Involvement Pertinent to Current Situation/Hospitalization: No - Comment as needed  Activities of Daily Living Home Assistive Devices/Equipment: Eyeglasses, Dentures (specify type) ADL Screening (condition at time of admission) Patient's cognitive ability adequate to safely complete daily activities?: Yes Is the patient deaf or have difficulty hearing?: No Does the patient have difficulty seeing, even when wearing glasses/contacts?: No Does the patient have difficulty concentrating, remembering, or making decisions?: No Patient able to express need for  assistance with ADLs?: Yes Does the patient have difficulty dressing or bathing?: No Independently performs ADLs?: Yes (appropriate for developmental age) Does the patient have difficulty walking or climbing stairs?: Yes Weakness of Legs: Both Weakness of Arms/Hands: Both  Permission Sought/Granted                  Emotional Assessment           Psych Involvement: No (comment)  Admission diagnosis:  Dehydration [E86.0] Lower urinary tract infectious disease [N39.0] Weakness [R53.1] Fever [R50.9] SIRS (systemic inflammatory response syndrome) (HCC) [R65.10] Acute pyelonephritis [N10] Patient Active Problem List   Diagnosis Date Noted   MRSA bacteremia    Epidural abscess    Weakness of both lower extremities    Acute pyelonephritis 04/01/2021   SIRS (systemic inflammatory response syndrome) (HCC) 03/31/2021   Type 2 diabetes mellitus with hyperglycemia (HCC) 03/31/2021   Hyponatremia 03/31/2021   Coronary artery disease of native artery of native heart with stable angina pectoris (HCC)    Abnormal cardiac CT angiography    Elevated C-reactive protein (CRP) 01/15/2021   Intermittent asthma without complication 01/13/2021   Shortness of breath 01/13/2021   Cigarette smoker 07/17/2019   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   Class 1 obesity due to excess calories with serious comorbidity and body mass index (BMI) of 30.0 to 30.9 in adult 06/12/2019   Mixed hyperlipidemia 05/01/2019   Tobacco use disorder 12/02/2017   Type 2 diabetes mellitus without complication, without long-term  current use of insulin (HCC) 01/30/2016   Encounter for long-term current use of high risk medication 01/29/2016   Essential hypertension 01/29/2016   Gastroesophageal reflux disease 01/29/2016   Osteoarthritis of multiple joints 01/29/2016   PCP:  Gordan Payment., MD Pharmacy:   [facility] [address], [place] - [address] [place] [address] Phone:  [phone] Fax: [phone]     Social Determinants of Health (SDOH) Interventions    Readmission Risk Interventions No flowsheet data found.

## 2021-04-03 NOTE — Progress Notes (Signed)
Rehab Admissions Coordinator Note:  Patient was screened by Clois Dupes for appropriateness for an Inpatient Acute Rehab Consult per change in therapy recommendations due to pain and weakness. Not yet demonstrated the tolerance for the intensity required of an inpt rehab admit. We will not place a rehab consult at this time, but follow her progress.  Clois Dupes RN MSN 04/03/2021, 5:54 PM  I can be reached at [phone].

## 2021-04-03 NOTE — Progress Notes (Addendum)
PROGRESS NOTE    Katie Serrano  ZOX:[id number] DOB: 1955/10/28 DOA: 03/30/2021 PCP: Gordan Payment., MD   Brief Narrative:  65 year old with history of class I obesity, Eagle syndrome, elevated CRP, HTN, GERD, asthma, tobacco use, COPD, DM2, HLD, osteoarthritis came to the ER with complaints of generalized malaise and fever of 105 degrees.  During this time patient had poor oral intake.  Also reporting of left-sided flank pain.  Upon admission noted to have UTI, hyponatremia. Started on IV Abx.  Eventually urine cultures grew MRSA and Aerococcus, blood cultures became positive as well therefore vancomycin was added.  Infectious disease consulted. Echocardiogram was negative for endocarditis therefore TEE was ordered.  MRI spine was ordered due to lower extremity cramping and weakness which is abnormal.  Assessment & Plan:   Principal Problem:   SIRS (systemic inflammatory response syndrome) (HCC) Active Problems:   Class 1 obesity due to excess calories with serious comorbidity and body mass index (BMI) of 30.0 to 30.9 in adult   Essential hypertension   Mixed hyperlipidemia   Coronary artery disease of native artery of native heart with stable angina pectoris (HCC)   Type 2 diabetes mellitus with hyperglycemia (HCC)   Hyponatremia   Acute pyelonephritis   MRSA bacteremia   Epidural abscess   Weakness of both lower extremities  Urinary tract infection and hematuria with left-sided flank pain, MRSA Gram-positive bacteremia -Urine cultures-MRSA/Aerococcus.  Blood cultures positive for MRSA - Antibiotics-vancomycin  -Infectious disease following - Echocardiogram-negative for endocarditis.  TEE ordered - Surveillance cultures ordered - CT abdomen pelvis renal study-b/l Pyelonephritis   B/L Neuropathic and cramping lower extremity pain -MRI Lumbar spine-abnormal with lumbar area joint effusion concerns for septic arthritis/acute facet arthritis, mass-effect of L5 nerve root, and  marrow edema in the paraspinous musculature concerning for acute myositis.  Neurosurgery- no surgical intervention at the moment.   Hyponatremia, suspect hypovolemia - Improved with IV fluids   Essential hypertension - Continue Norvasc 5 mg daily, losartan 50 mg bedtime, metoprolol 25 mg twice daily.  IV hydralazine as needed    Mixed hyperlipidemia -statin stopped due to her allergy and concerns.      Coronary artery disease -Currently chest pain-free.  Continue home meds     Type 2 diabetes mellitus with hyperglycemia (HCC), hemoglobin A1c 9.8 -Diabetic diet.  Hold metformin.  Insulin sliding scale and Accu-Cheks -Increase Lantus 10 units daily     Class 1 obesity Needs lifestyle modifications. Follow-up with PCP.  DVT prophylaxis: Lovenox Code Status: Full code Family Communication: Delice Bison updated  [phone]  Dispo: The patient is from: Home              Anticipated d/c is to: Home              Patient currently is not medically stable to d/c. Currently needs IV Abx.    Difficult to place patient Yes  Subjective: Complaints of blurry vision from her right eye and severe headaches.   Examination: Constitutional: Not in acute distress Respiratory: Clear to auscultation bilaterally Cardiovascular: Normal sinus rhythm, no rubs Abdomen: Nontender nondistended good bowel sounds Musculoskeletal: No edema noted Skin: No rashes seen Neurologic: CN 2-12 grossly intact.  And nonfocal Psychiatric: Normal judgment and insight. Alert and oriented x 3. Normal mood.    Objective: Vitals:   04/02/21 1750 04/02/21 2053 04/03/21 0019 04/03/21 0454  BP: 133/74 (!) 158/76 (!) 156/71 (!) 143/74  Pulse: 81 84 74 88  Resp: 16 18  18 18  Temp: 98.5 F (36.9 C) 98 F (36.7 C) 98.1 F (36.7 C) 99.8 F (37.7 C)  TempSrc: Oral Oral Oral Oral  SpO2: 96% 97% 95% 93%  Weight:      Height:        Intake/Output Summary (Last 24 hours) at 04/03/2021 0833 Last data filed at 04/03/2021  0700 Gross per 24 hour  Intake 1840 ml  Output 3650 ml  Net -1810 ml   Filed Weights   03/30/21 2345  Weight: 83.9 kg    Data Reviewed:   CBC: Recent Labs  Lab 03/30/21 2021 03/31/21 0442 04/01/21 0504 04/02/21 0353 04/03/21 0015  WBC 20.7* 21.5* 24.5* 23.7* 22.8*  NEUTROABS  --  17.6*  --   --   --   HGB 14.6 14.0 12.9 12.5 12.5  HCT 43.3 42.1 38.6 36.6 36.2  MCV 86.9 87.9 85.4 85.3 85.2  PLT 195 214 239 273 350   Basic Metabolic Panel: Recent Labs  Lab 03/31/21 0442 03/31/21 1418 04/01/21 0504 04/02/21 0353 04/03/21 0015  NA 125* 127* 132* 131* 133*  K 3.8 3.8 3.2* 3.5 3.3*  CL 93* 92* 96* 98 101  CO2 17* 21* GLUCOSE 271* 271* 247* 207* 179*  BUN CREATININE 0.95 0.95 0.87 0.87 0.88  CALCIUM 8.3* 8.8* 8.6* 8.4* 8.7*  MG 2.1  --  2.3 2.3 2.2   GFR: Estimated Creatinine Clearance: 69.5 mL/min (by C-G formula based on SCr of 0.88 mg/dL). Liver Function Tests: Recent Labs  Lab 03/31/21 0442  AST 36  ALT 52*  ALKPHOS 72  BILITOT 0.8  PROT 6.4*  ALBUMIN 2.7*   No results for input(s): LIPASE, AMYLASE in the last 168 hours. No results for input(s): AMMONIA in the last 168 hours. Coagulation Profile: No results for input(s): INR, PROTIME in the last 168 hours. Cardiac Enzymes: Recent Labs  Lab 03/31/21 0442 04/01/21 1108  CKTOTAL 244* 62   BNP (last 3 results) No results for input(s): PROBNP in the last 8760 hours. HbA1C: Recent Labs    03/31/21 1354  HGBA1C 9.8*   CBG: Recent Labs  Lab 04/02/21 0618 04/02/21 1123 04/02/21 1606 04/02/21 2107 04/03/21 0608  GLUCAP 212* 208* 231* 209* 186*   Lipid Profile: No results for input(s): CHOL, HDL, LDLCALC, TRIG, CHOLHDL, LDLDIRECT in the last 72 hours. Thyroid Function Tests: No results for input(s): TSH, T4TOTAL, FREET4, T3FREE, THYROIDAB in the last 72 hours. Anemia Panel: No results for input(s): VITAMINB12, FOLATE, FERRITIN, TIBC, IRON, RETICCTPCT in the last  72 hours. Sepsis Labs: Recent Labs  Lab 03/31/21 0442 04/01/21 1108  PROCALCITON 2.97 3.75    Recent Results (from the past 240 hour(s))  Resp Panel by RT-PCR (Flu A&B, Covid) Nasopharyngeal Swab     Status: None   Collection Time: 03/31/21 12:21 AM   Specimen: Nasopharyngeal Swab; Nasopharyngeal(NP) swabs in vial transport medium  Result Value Ref Range Status   SARS Coronavirus 2 by RT PCR NEGATIVE NEGATIVE Final    Comment: (NOTE) SARS-CoV-2 target nucleic acids are NOT DETECTED.  The SARS-CoV-2 RNA is generally detectable in upper respiratory specimens during the acute phase of infection. The lowest concentration of SARS-CoV-2 viral copies this assay can detect is 138 copies/mL. A negative result does not preclude SARS-Cov-2 infection and should not be used as the sole basis for treatment or other patient management decisions. A negative result may occur with  improper specimen collection/handling, submission of specimen  other than nasopharyngeal swab, presence of viral mutation(s) within the areas targeted by this assay, and inadequate number of viral copies(<138 copies/mL). A negative result must be combined with clinical observations, patient history, and epidemiological information. The expected result is Negative.  Fact Sheet for Patients:  BloggerCourse.com  Fact Sheet for Healthcare Providers:  SeriousBroker.it  This test is no t yet approved or cleared by the [place] FDA and  has been authorized for detection and/or diagnosis of SARS-CoV-2 by FDA under an Emergency Use Authorization (EUA). This EUA will remain  in effect (meaning this test can be used) for the duration of the COVID-19 declaration under Section 564(b)(1) of the Act, 21 U.S.C.section 360bbb-3(b)(1), unless the authorization is terminated  or revoked sooner.       Influenza A by PCR NEGATIVE NEGATIVE Final   Influenza B by PCR NEGATIVE  NEGATIVE Final    Comment: (NOTE) The Xpert Xpress SARS-CoV-2/FLU/RSV plus assay is intended as an aid in the diagnosis of influenza from Nasopharyngeal swab specimens and should not be used as a sole basis for treatment. Nasal washings and aspirates are unacceptable for Xpert Xpress SARS-CoV-2/FLU/RSV testing.  Fact Sheet for Patients: BloggerCourse.com  Fact Sheet for Healthcare Providers: SeriousBroker.it  This test is not yet approved or cleared by the [place] FDA and has been authorized for detection and/or diagnosis of SARS-CoV-2 by FDA under an Emergency Use Authorization (EUA). This EUA will remain in effect (meaning this test can be used) for the duration of the COVID-19 declaration under Section 564(b)(1) of the Act, 21 U.S.C. section 360bbb-3(b)(1), unless the authorization is terminated or revoked.  Performed at [facility], [address], [place], [place] [address]   Urine Culture     Status: Abnormal   Collection Time: 03/31/21  5:51 AM   Specimen: Urine, Clean Catch  Result Value Ref Range Status   Specimen Description URINE, CLEAN CATCH  Final   Special Requests NONE  Final   Culture (A)  Final    >=100,000 COLONIES/mL METHICILLIN RESISTANT STAPHYLOCOCCUS AUREUS 40,000 COLONIES/mL AEROCOCCUS SPECIES Standardized susceptibility testing for this organism is not available. Performed at [facility], [address], [place], [place] [address]    Report Status 04/02/2021 FINAL  Final   Organism ID, Bacteria METHICILLIN RESISTANT STAPHYLOCOCCUS AUREUS (A)  Final      Susceptibility   Methicillin resistant staphylococcus aureus - MIC*    CIPROFLOXACIN >=8 RESISTANT Resistant     GENTAMICIN <=0.5 SENSITIVE Sensitive     NITROFURANTOIN <=16 SENSITIVE Sensitive     OXACILLIN >=4 RESISTANT Resistant     TETRACYCLINE <=1 SENSITIVE Sensitive     VANCOMYCIN 1 SENSITIVE Sensitive     TRIMETH/SULFA  <=10 SENSITIVE Sensitive     CLINDAMYCIN <=0.25 SENSITIVE Sensitive     RIFAMPIN <=0.5 SENSITIVE Sensitive     Inducible Clindamycin NEGATIVE Sensitive     * >=100,000 COLONIES/mL METHICILLIN RESISTANT STAPHYLOCOCCUS AUREUS  Culture, blood (Routine X 2) w Reflex to ID Panel     Status: Abnormal (Preliminary result)   Collection Time: 04/01/21 11:08 AM   Specimen: BLOOD  Result Value Ref Range Status   Specimen Description BLOOD LEFT ANTECUBITAL  Final   Special Requests   Final    BOTTLES DRAWN AEROBIC AND ANAEROBIC Blood Culture adequate volume   Culture  Setup Time   Final    GRAM POSITIVE COCCI IN CLUSTERS IN BOTH AEROBIC AND ANAEROBIC BOTTLES Organism ID to follow CRITICAL RESULT CALLED  TO, READ BACK BY AND VERIFIED WITH: PARMD MIRANDA BRYK BY MESSAN H. AT 0552 02 7 27 2022    Culture (A)  Final    STAPHYLOCOCCUS AUREUS SUSCEPTIBILITIES TO FOLLOW Performed at [facility], [address] [place], [place] [address]    Report Status PENDING  Incomplete  Blood Culture ID Panel (Reflexed)     Status: Abnormal   Collection Time: 04/01/21 11:08 AM  Result Value Ref Range Status   Enterococcus faecalis NOT DETECTED NOT DETECTED Final   Enterococcus Faecium NOT DETECTED NOT DETECTED Final   Listeria monocytogenes NOT DETECTED NOT DETECTED Final   Staphylococcus species DETECTED (A) NOT DETECTED Final    Comment: CRITICAL RESULT CALLED TO, READ BACK BY AND VERIFIED WITH: PARMD MIRANDA BRYK BY MESSAN H. AT 0552 02 7 27 2022    Staphylococcus aureus (BCID) DETECTED (A) NOT DETECTED Final    Comment: Methicillin (oxacillin)-resistant Staphylococcus aureus (MRSA). MRSA is predictably resistant to beta-lactam antibiotics (except ceftaroline). Preferred therapy is vancomycin unless clinically contraindicated. Patient requires contact precautions if  hospitalized. CRITICAL RESULT CALLED TO, READ BACK BY AND VERIFIED WITH: PARMD MIRANDA BRYK BY MESSAN H. AT 0552 02 7 27 2022     Staphylococcus epidermidis NOT DETECTED NOT DETECTED Final   Staphylococcus lugdunensis NOT DETECTED NOT DETECTED Final   Streptococcus species NOT DETECTED NOT DETECTED Final   Streptococcus agalactiae NOT DETECTED NOT DETECTED Final   Streptococcus pneumoniae NOT DETECTED NOT DETECTED Final   Streptococcus pyogenes NOT DETECTED NOT DETECTED Final   A.calcoaceticus-baumannii NOT DETECTED NOT DETECTED Final   Bacteroides fragilis NOT DETECTED NOT DETECTED Final   Enterobacterales NOT DETECTED NOT DETECTED Final   Enterobacter cloacae complex NOT DETECTED NOT DETECTED Final   Escherichia coli NOT DETECTED NOT DETECTED Final   Klebsiella aerogenes NOT DETECTED NOT DETECTED Final   Klebsiella oxytoca NOT DETECTED NOT DETECTED Final   Klebsiella pneumoniae NOT DETECTED NOT DETECTED Final   Proteus species NOT DETECTED NOT DETECTED Final   Salmonella species NOT DETECTED NOT DETECTED Final   Serratia marcescens NOT DETECTED NOT DETECTED Final   Haemophilus influenzae NOT DETECTED NOT DETECTED Final   Neisseria meningitidis NOT DETECTED NOT DETECTED Final   Pseudomonas aeruginosa NOT DETECTED NOT DETECTED Final   Stenotrophomonas maltophilia NOT DETECTED NOT DETECTED Final   Candida albicans NOT DETECTED NOT DETECTED Final   Candida auris NOT DETECTED NOT DETECTED Final   Candida glabrata NOT DETECTED NOT DETECTED Final   Candida krusei NOT DETECTED NOT DETECTED Final   Candida parapsilosis NOT DETECTED NOT DETECTED Final   Candida tropicalis NOT DETECTED NOT DETECTED Final   Cryptococcus neoformans/gattii NOT DETECTED NOT DETECTED Final   Meth resistant mecA/C and MREJ DETECTED (A) NOT DETECTED Final    Comment: CRITICAL RESULT CALLED TO, READ BACK BY AND VERIFIED WITH: PARMD MIRANDA BRYK BY MESSAN H. AT 0552 02 7 27 2022 Performed at [facility], [address], [place], [place] [address]   Culture, blood (Routine X 2) w Reflex to ID Panel     Status: Abnormal (Preliminary result)    Collection Time: 04/01/21 11:18 AM   Specimen: BLOOD RIGHT HAND  Result Value Ref Range Status   Specimen Description BLOOD RIGHT HAND  Final   Special Requests   Final    BOTTLES DRAWN AEROBIC ONLY Blood Culture adequate volume   Culture  Setup Time   Final    GRAM POSITIVE COCCI IN CLUSTERS AEROBIC BOTTLE ONLY CRITICAL VALUE  NOTED.  VALUE IS CONSISTENT WITH PREVIOUSLY REPORTED AND CALLED VALUE. Performed at [facility], [address] [place], [place] [address]    Culture STAPHYLOCOCCUS AUREUS (A)  Final   Report Status PENDING  Incomplete         Radiology Studies: MR Lumbar Spine W Wo Contrast  Result Date: 04/03/2021 CLINICAL DATA:  Initial evaluation for intermittent left flank pain with radiation into the left lower back. EXAM: MRI LUMBAR SPINE WITHOUT AND WITH CONTRAST TECHNIQUE: Multiplanar and multiecho pulse sequences of the lumbar spine were obtained without and with intravenous contrast. CONTRAST:  8.78mL GADAVIST GADOBUTROL 1 MMOL/ML IV SOLN COMPARISON:  Prior CT from 03/31/2024. FINDINGS: Segmentation: Standard. Lowest well-formed disc space labeled the L5-S1 level. Alignment: Physiologic with preservation of the normal lumbar lordosis. No listhesis. Vertebrae: Vertebral body height maintained without acute or chronic fracture. Bone marrow signal intensity within normal limits. No worrisome osseous lesions. Joint effusions noted about the left greater than right L4-5 facets without significant marrow edema. No other abnormal marrow edema or enhancement to suggest osteomyelitis discitis. Conus medullaris and cauda equina: Conus extends to the L2 level. Conus and cauda equina appear normal. Paraspinal and other soft tissues: Diffuse edema and enhancement seen about the left greater than right lower posterior paraspinous musculature, most pronounced about the left L4-5 facet (series 6, image 12) small collection emanating from the left L4-5 facet as below. No other soft  tissue collections. Mild scattered perinephric and retroperitoneal stranding noted, of uncertain significance, also seen on prior CT. Left periaortic lymph node measures 1 cm in short access, upper limits of normal. Visualized visceral structures otherwise unremarkable. Disc levels: L1-2: Negative interspace. Minimal facet spurring. No canal or foraminal stenosis. L2-3: Mild disc bulge with disc desiccation. Small biforaminal annular fissures noted. Mild facet hypertrophy with associated trace joint effusions. No significant spinal stenosis. Foramina remain patent. L3-4: Disc desiccation with mild annular disc bulge. Superimposed small left foraminal to extraforaminal disc protrusion closely approximates the exiting left L3 nerve root (series 8, image 23). Mild facet spurring with associated trace joint effusions. Tiny T2 hyperintense lesions measuring 4-5 mm situated posterior to the L3-4 facets noted, possibly reflecting small synovial cysts (series 8, image 27). These are not in a position to cause neural compromise. No spinal stenosis. Foramina remain patent. L4-5: Mild disc bulge with disc desiccation. Superimposed small left foraminal disc protrusion closely approximates and/or contacts the exiting left L4 nerve root (series 7, image 13). Moderate left worse than right facet hypertrophy with associated small joint effusions. There is a superimposed cystic collection/lesion along the anteromedial aspect of the left L4-5 facet, measuring 1.2 x 0.5 x 1.3 cm (series 6, image 11). Lesion contacts and mildly displaces the descending left L5 nerve root in the left lateral recess with resultant mild left lateral recess stenosis. There is an additional T2 hyperintense lesion situated posterior to the L4-5 facet measuring 1.6 x 0.9 x 1.2 cm (series 8, image 33). These lesions demonstrate peripheral enhancement. Overall, findings are favored to reflect acute facet arthritis with associated synovial cysts. Possible septic  arthritis with associated small collections difficult to exclude, and could be considered in the correct clinical setting. No other significant spinal stenosis. Mild left foraminal narrowing. Right neural foramina remains patent. L5-S1: Minimal disc bulge with disc desiccation. Small central annular fissure. Mild bilateral facet hypertrophy. No canal or lateral recess stenosis. Foramina remain patent. IMPRESSION: 1. Joint effusion with surrounding enhancement about the left L4-5 facet, with  associated two cystic collections/lesions measuring up to 1.6 cm as above. 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 Possible septic arthritis with associated small collections difficult to exclude, and could be considered in the correct clinical setting. Secondary mass effect on the descending left L5 nerve root by one of these collections with resultant mild left lateral recess stenosis, which could contribute to left lower extremity symptoms. 2. Abnormal edema and enhancement within the left greater than right lower posterior paraspinous musculature, greatest about the left L4-5 facet. Findings suggestive of acute myositis, which could be either infectious or inflammatory in nature. Possible muscular injury/strain could also be considered in the correct clinical setting. 3. Additional small left foraminal to extraforaminal disc protrusions at L3-4 and L4-5, potentially affecting the exiting left L3 and L4 nerve roots respectively. 4. No other evidence for acute infection within the lumbar spine. Electronically Signed   By: Rise Mu M.D.   On: 04/03/2021 02:25   ECHOCARDIOGRAM COMPLETE  Result Date: 04/02/2021    ECHOCARDIOGRAM REPORT   Patient Name:   LAURALYNN LOEB Date of Exam: 04/02/2021 Medical Rec #:  [id number]       Height:       66.0 in Accession #:    [id number]      Weight:       185.0 lb Date of Birth:  1955-12-10       BSA:          1.934  m Patient Age:    64 years        BP:           161/89 mmHg Patient Gender: F               HR:           88 bpm. Exam Location:  Inpatient Procedure: 2D Echo, Cardiac Doppler and Color Doppler Indications:    Bacteremia  History:        Patient has no prior history of Echocardiogram examinations.                 CAD, COPD, Signs/Symptoms:Fever, UTI, Fatigue and Bacteremia;                 Risk Factors:Hypertension, Dyslipidemia, Diabetes, Current                 Smoker and Obesity.  Sonographer:    Lavenia Atlas Referring Phys: [id number] [facility] Adel Neyer  Sonographer Comments: Technically difficult study due to poor echo windows and patient is morbidly obese. IMPRESSIONS  1. Left ventricular ejection fraction, by estimation, is 60 to 65%. The left ventricle has normal function. The left ventricle has no regional wall motion abnormalities. There is mild left ventricular hypertrophy. Left ventricular diastolic parameters were normal.  2. Right ventricular systolic function is normal. The right ventricular size is normal. There is normal pulmonary artery systolic pressure. The estimated right ventricular systolic pressure is 25.7 mmHg.  3. The mitral valve is normal in structure. Trivial mitral valve regurgitation. No evidence of mitral stenosis.  4. The aortic valve is tricuspid. Aortic valve regurgitation is not visualized. No aortic stenosis is present.  5. The inferior vena cava is normal in size with greater than 50% respiratory variability, suggesting right atrial pressure of 3 mmHg. Conclusion(s)/Recommendation(s): No vegetation seen. If high clinical suspicion for endocarditis, recommend TEE. FINDINGS  Left Ventricle: Left ventricular ejection fraction, by estimation, is 60 to 65%. The  left ventricle has normal function. The left ventricle has no regional wall motion abnormalities. The left ventricular internal cavity size was normal in size. There is  mild left ventricular hypertrophy. Left ventricular  diastolic parameters were normal. Right Ventricle: The right ventricular size is normal. No increase in right ventricular wall thickness. Right ventricular systolic function is normal. There is normal pulmonary artery systolic pressure. The tricuspid regurgitant velocity is 2.38 m/s, and  with an assumed right atrial pressure of 3 mmHg, the estimated right ventricular systolic pressure is 25.7 mmHg. Left Atrium: Left atrial size was normal in size. Right Atrium: Right atrial size was normal in size. Pericardium: Trivial pericardial effusion is present. Presence of pericardial fat pad. Mitral Valve: The mitral valve is normal in structure. Trivial mitral valve regurgitation. No evidence of mitral valve stenosis. Tricuspid Valve: The tricuspid valve is normal in structure. Tricuspid valve regurgitation is trivial. Aortic Valve: The aortic valve is tricuspid. Aortic valve regurgitation is not visualized. No aortic stenosis is present. Pulmonic Valve: The pulmonic valve was not well visualized. Pulmonic valve regurgitation is not visualized. Aorta: The aortic root and ascending aorta are structurally normal, with no evidence of dilitation. Venous: The inferior vena cava is normal in size with greater than 50% respiratory variability, suggesting right atrial pressure of 3 mmHg. IAS/Shunts: The interatrial septum was not well visualized.  LEFT VENTRICLE PLAX 2D LVIDd:         4.60 cm  Diastology LVIDs:         2.80 cm  LV e' medial:    8.92 cm/s LV PW:         1.00 cm  LV E/e' medial:  10.4 LV IVS:        1.00 cm  LV e' lateral:   7.29 cm/s LVOT diam:     2.10 cm  LV E/e' lateral: 12.7 LV SV:         70 LV SV Index:   36 LVOT Area:     3.46 cm  RIGHT VENTRICLE RV Basal diam:  2.80 cm RV S prime:     7.83 cm/s TAPSE (M-mode): 1.4 cm LEFT ATRIUM             Index       RIGHT ATRIUM           Index LA diam:        3.40 cm 1.76 cm/m  RA Area:     11.40 cm LA Vol (A2C):   28.6 ml 14.78 ml/m RA Volume:   26.50 ml  13.70  ml/m LA Vol (A4C):   31.9 ml 16.49 ml/m LA Biplane Vol: 30.7 ml 15.87 ml/m  AORTIC VALVE LVOT Vmax:   98.90 cm/s LVOT Vmean:  67.600 cm/s LVOT VTI:    0.202 m  AORTA Ao Root diam: 2.80 cm MITRAL VALVE               TRICUSPID VALVE MV Area (PHT): 3.56 cm    TR Peak grad:   22.7 mmHg MV Decel Time: 213 msec    TR Vmax:        238.00 cm/s MV E velocity: 92.70 cm/s MV A velocity: 77.10 cm/s  SHUNTS MV E/A ratio:  1.20        Systemic VTI:  0.20 m                            Systemic Diam: 2.10 cm Cristal Deer  Bjorn PippinSchumann MD Electronically signed by Epifanio Lescheshristopher Schumann MD Signature Date/Time: 04/02/2021/1:14:35 PM    Final         Scheduled Meds:  amLODipine  5 mg Oral Daily   aspirin  81 mg Oral Daily   atorvastatin  80 mg Oral Daily   enoxaparin (LOVENOX) injection  40 mg Subcutaneous Q24H   insulin aspart  0-15 Units Subcutaneous TID WC   insulin aspart  0-5 Units Subcutaneous QHS   insulin glargine-yfgn  8 Units Subcutaneous Daily   losartan  50 mg Oral QHS   metoprolol tartrate  25 mg Oral BID   multivitamin with minerals  1 tablet Oral Daily   omega-3 acid ethyl esters  1 g Oral Daily   potassium chloride  40 mEq Oral Once   Continuous Infusions:  vancomycin       LOS: 2 days   Time spent= 35 mins    Alver Leete Joline Maxcyhirag German Manke, MD [facility]  If 7PM-7AM, please contact night-coverage  04/03/2021, 8:33 AM

## 2021-04-04 ENCOUNTER — Inpatient Hospital Stay (HOSPITAL_COMMUNITY): Payer: Medicare Other | Admitting: Certified Registered Nurse Anesthetist

## 2021-04-04 ENCOUNTER — Inpatient Hospital Stay (HOSPITAL_COMMUNITY): Payer: Medicare Other

## 2021-04-04 ENCOUNTER — Encounter (HOSPITAL_COMMUNITY)
Admission: EM | Disposition: A | Discharge status: Rehabilitation Facility | Payer: Self-pay | Source: Home / Self Care | Attending: Family Medicine

## 2021-04-04 ENCOUNTER — Encounter (HOSPITAL_COMMUNITY): Payer: Self-pay | Admitting: Internal Medicine

## 2021-04-04 DIAGNOSIS — I76 Septic arterial embolism: Secondary | ICD-10-CM

## 2021-04-04 DIAGNOSIS — E782 Mixed hyperlipidemia: Secondary | ICD-10-CM

## 2021-04-04 DIAGNOSIS — I330 Acute and subacute infective endocarditis: Secondary | ICD-10-CM

## 2021-04-04 DIAGNOSIS — E6609 Other obesity due to excess calories: Secondary | ICD-10-CM | POA: Diagnosis not present

## 2021-04-04 DIAGNOSIS — R7881 Bacteremia: Secondary | ICD-10-CM | POA: Diagnosis not present

## 2021-04-04 DIAGNOSIS — I340 Nonrheumatic mitral (valve) insufficiency: Secondary | ICD-10-CM | POA: Diagnosis not present

## 2021-04-04 DIAGNOSIS — G062 Extradural and subdural abscess, unspecified: Secondary | ICD-10-CM | POA: Diagnosis not present

## 2021-04-04 DIAGNOSIS — R6510 Systemic inflammatory response syndrome (SIRS) of non-infectious origin without acute organ dysfunction: Secondary | ICD-10-CM | POA: Diagnosis not present

## 2021-04-04 DIAGNOSIS — N10 Acute tubulo-interstitial nephritis: Secondary | ICD-10-CM | POA: Diagnosis not present

## 2021-04-04 DIAGNOSIS — I639 Cerebral infarction, unspecified: Secondary | ICD-10-CM

## 2021-04-04 DIAGNOSIS — E871 Hypo-osmolality and hyponatremia: Secondary | ICD-10-CM

## 2021-04-04 DIAGNOSIS — E1165 Type 2 diabetes mellitus with hyperglycemia: Secondary | ICD-10-CM | POA: Diagnosis not present

## 2021-04-04 HISTORY — PX: TEE WITHOUT CARDIOVERSION: SHX5443

## 2021-04-04 HISTORY — PX: BUBBLE STUDY: SHX6837

## 2021-04-04 LAB — CULTURE, BLOOD (ROUTINE X 2)
Special Requests: ADEQUATE
Special Requests: ADEQUATE

## 2021-04-04 LAB — BASIC METABOLIC PANEL
Anion gap: 8 (ref 5–15)
BUN: 11 mg/dL (ref 8–23)
CO2: 26 mmol/L (ref 22–32)
Calcium: 8.7 mg/dL — ABNORMAL LOW (ref 8.9–10.3)
Chloride: 101 mmol/L (ref 98–111)
Creatinine, Ser: 0.81 mg/dL (ref 0.44–1.00)
GFR, Estimated: >60 mL/min (ref >60)
Glucose, Bld: 221 mg/dL — ABNORMAL HIGH (ref 70–99)
Potassium: 3.4 mmol/L — ABNORMAL LOW (ref 3.5–5.1)
Sodium: 135 mmol/L (ref 135–145)

## 2021-04-04 LAB — MAGNESIUM: Magnesium: 2 mg/dL (ref 1.7–2.4)

## 2021-04-04 LAB — GLUCOSE, CAPILLARY
Glucose-Capillary: 235 mg/dL — ABNORMAL HIGH (ref 70–99)
Glucose-Capillary: 218 mg/dL — ABNORMAL HIGH (ref 70–99)
Glucose-Capillary: 183 mg/dL — ABNORMAL HIGH (ref 70–99)
Glucose-Capillary: 244 mg/dL — ABNORMAL HIGH (ref 70–99)
Glucose-Capillary: 204 mg/dL — ABNORMAL HIGH (ref 70–99)

## 2021-04-04 LAB — CBC
HCT: 37 % (ref 36.0–46.0)
Hemoglobin: 12.8 g/dL (ref 12.0–15.0)
MCH: 29.1 pg (ref 26.0–34.0)
MCHC: 34.6 g/dL (ref 30.0–36.0)
MCV: 84.1 fL (ref 80.0–100.0)
Platelets: 401 10*3/uL — ABNORMAL HIGH (ref 150–400)
RBC: 4.4 MIL/uL (ref 3.87–5.11)
RDW: 14.2 % (ref 11.5–15.5)
WBC: 18 10*3/uL — ABNORMAL HIGH (ref 4.0–10.5)
nRBC: 0 % (ref 0.0–0.2)

## 2021-04-04 SURGERY — ECHOCARDIOGRAM, TRANSESOPHAGEAL
Anesthesia: General

## 2021-04-04 MED ORDER — SODIUM CHLORIDE 0.9 % IV SOLN: INTRAVENOUS | Status: DC

## 2021-04-04 MED ORDER — BUTAMBEN-TETRACAINE-BENZOCAINE 2-2-14 % EX AERO
INHALATION_SPRAY | CUTANEOUS | Status: DC | PRN
  Administered 2021-04-04: 2 via TOPICAL

## 2021-04-04 MED ORDER — PROPOFOL 500 MG/50ML IV EMUL
INTRAVENOUS | Status: DC | PRN
  Administered 2021-04-04: 50 ug/kg/min via INTRAVENOUS

## 2021-04-04 MED ORDER — PROPOFOL 10 MG/ML IV BOLUS
INTRAVENOUS | Status: DC | PRN
  Administered 2021-04-04: 10 mg via INTRAVENOUS

## 2021-04-04 MED ORDER — INSULIN GLARGINE-YFGN 100 UNIT/ML ~~LOC~~ SOLN
14.0000 [IU] | Freq: Every day | SUBCUTANEOUS | Status: DC
  Administered 2021-04-05: 14 [IU] via SUBCUTANEOUS
  Filled 2021-04-04 (×2): qty 0.14

## 2021-04-04 MED ORDER — LIDOCAINE 2% (20 MG/ML) 5 ML SYRINGE
INTRAMUSCULAR | Status: DC | PRN
  Administered 2021-04-04: 100 mg via INTRAVENOUS

## 2021-04-04 MED ORDER — IOHEXOL 350 MG/ML SOLN
75.0000 mL | Freq: Once | INTRAVENOUS | Status: AC | PRN
  Administered 2021-04-04: 75 mL via INTRAVENOUS

## 2021-04-04 NOTE — Progress Notes (Signed)
Subjective: No new complaints. Has persistent R >L lower extremity weakness.    Antibiotics:  Anti-infectives (From admission, onward)    Start     Dose/Rate Route Frequency Ordered Stop   04/03/21 0900  vancomycin (VANCOREADY) IVPB 1500 mg/300 mL        1,500 mg 150 mL/hr over 120 Minutes Intravenous Every 24 hours 04/02/21 0703     04/02/21 0800  vancomycin (VANCOREADY) IVPB 2000 mg/400 mL        2,000 mg 200 mL/hr over 120 Minutes Intravenous  Once 04/02/21 0703 04/02/21 1046   04/01/21 1230  ampicillin (OMNIPEN) 1 g in sodium chloride 0.9 % 100 mL IVPB  Status:  Discontinued        1 g 300 mL/hr over 20 Minutes Intravenous Every 6 hours 04/01/21 1137 04/02/21 0655   04/01/21 0300  cefTRIAXone (ROCEPHIN) 1 g in sodium chloride 0.9 % 100 mL IVPB  Status:  Discontinued        1 g 200 mL/hr over 30 Minutes Intravenous Every 24 hours 03/31/21 0653 04/02/21 0844   03/31/21 0300  cefTRIAXone (ROCEPHIN) 1 g in sodium chloride 0.9 % 100 mL IVPB        1 g 200 mL/hr over 30 Minutes Intravenous  Once 03/31/21 0250 03/31/21 0430       Medications: Scheduled Meds:  amLODipine  5 mg Oral Daily   aspirin  81 mg Oral Daily   enoxaparin (LOVENOX) injection  40 mg Subcutaneous Q24H   insulin aspart  0-15 Units Subcutaneous TID WC   insulin aspart  0-5 Units Subcutaneous QHS   insulin glargine-yfgn  10 Units Subcutaneous Daily   losartan  50 mg Oral QHS   metoprolol tartrate  25 mg Oral BID   multivitamin with minerals  1 tablet Oral Daily   omega-3 acid ethyl esters  1 g Oral Daily   Continuous Infusions:  vancomycin 1,500 mg (04/04/21 1001)   PRN Meds:.acetaminophen, dextromethorphan-guaiFENesin, gadobutrol, hydrALAZINE, ipratropium-albuterol, nitroGLYCERIN, ondansetron **OR** ondansetron (ZOFRAN) IV, oxyCODONE, senna-docusate    Objective: Weight change:   Intake/Output Summary (Last 24 hours) at 04/04/2021 1616 Last data filed at 04/04/2021 1223 Gross per 24 hour   Intake 100 ml  Output 1100 ml  Net -1000 ml   Blood pressure (!) 176/76, pulse 78, temperature 98.5 F (36.9 C), temperature source Oral, resp. rate 18, height 5\' 6"  (1.676 m), weight 83.9 kg, SpO2 93 %. Temp:  [96.4 F (35.8 C)-99.5 F (37.5 C)] 98.5 F (36.9 C) (07/29 1232) Pulse Rate:  [66-95] 78 (07/29 1315) Resp:  [16-27] 18 (07/29 1315) BP: (124-177)/(60-84) 176/76 (07/29 1315) SpO2:  [92 %-97 %] 93 % (07/29 1315)  Physical Exam: Physical Exam Constitutional:      General: She is not in acute distress.    Appearance: She is obese. She is not ill-appearing.  Eyes:     General: No scleral icterus.       Right eye: No discharge.        Left eye: No discharge.     Conjunctiva/sclera: Conjunctivae normal.  Cardiovascular:     Rate and Rhythm: Normal rate and regular rhythm.     Heart sounds: No murmur heard.   No friction rub. No gallop.  Pulmonary:     Effort: Pulmonary effort is normal.     Breath sounds: Normal breath sounds. No wheezing, rhonchi or rales.  Abdominal:     General: There is no distension.  Palpations: Abdomen is soft.     Tenderness: There is no abdominal tenderness.  Musculoskeletal:        General: No swelling or tenderness.     Right lower leg: No edema.     Left lower leg: No edema.  Skin:    General: Skin is warm and dry.     Coloration: Skin is not jaundiced.  Neurological:     Mental Status: She is alert.     Motor: Weakness present.     Comments: R > L LE weakness. Improved R 3/5. L 4/5      CBC:    BMET Recent Labs    04/03/21 0015 04/04/21 0359  NA 133* 135  K 3.3* 3.4*  CL 101 101  CO2 25 26  GLUCOSE 179* 221*  BUN 13 11  CREATININE 0.88 0.81  CALCIUM 8.7* 8.7*     Liver Panel  No results for input(s): PROT, ALBUMIN, AST, ALT, ALKPHOS, BILITOT, BILIDIR, IBILI in the last 72 hours.     Sedimentation Rate No results for input(s): ESRSEDRATE in the last 72 hours. C-Reactive Protein No results for input(s):  CRP in the last 72 hours.  Micro Results: Recent Results (from the past 720 hour(s))  Resp Panel by RT-PCR (Flu A&B, Covid) Nasopharyngeal Swab     Status: None   Collection Time: 03/31/21 12:21 AM   Specimen: Nasopharyngeal Swab; Nasopharyngeal(NP) swabs in vial transport medium  Result Value Ref Range Status   SARS Coronavirus 2 by RT PCR NEGATIVE NEGATIVE Final    Comment: (NOTE) SARS-CoV-2 target nucleic acids are NOT DETECTED.  The SARS-CoV-2 RNA is generally detectable in upper respiratory specimens during the acute phase of infection. The lowest concentration of SARS-CoV-2 viral copies this assay can detect is 138 copies/mL. A negative result does not preclude SARS-Cov-2 infection and should not be used as the sole basis for treatment or other patient management decisions. A negative result may occur with  improper specimen collection/handling, submission of specimen other than nasopharyngeal swab, presence of viral mutation(s) within the areas targeted by this assay, and inadequate number of viral copies(<138 copies/mL). A negative result must be combined with clinical observations, patient history, and epidemiological information. The expected result is Negative.  Fact Sheet for Patients:  BloggerCourse.com  Fact Sheet for Healthcare Providers:  SeriousBroker.it  This test is no t yet approved or cleared by the [place] FDA and  has been authorized for detection and/or diagnosis of SARS-CoV-2 by FDA under an Emergency Use Authorization (EUA). This EUA will remain  in effect (meaning this test can be used) for the duration of the COVID-19 declaration under Section 564(b)(1) of the Act, 21 U.S.C.section 360bbb-3(b)(1), unless the authorization is terminated  or revoked sooner.       Influenza A by PCR NEGATIVE NEGATIVE Final   Influenza B by PCR NEGATIVE NEGATIVE Final    Comment: (NOTE) The Xpert Xpress  SARS-CoV-2/FLU/RSV plus assay is intended as an aid in the diagnosis of influenza from Nasopharyngeal swab specimens and should not be used as a sole basis for treatment. Nasal washings and aspirates are unacceptable for Xpert Xpress SARS-CoV-2/FLU/RSV testing.  Fact Sheet for Patients: BloggerCourse.com  Fact Sheet for Healthcare Providers: SeriousBroker.it  This test is not yet approved or cleared by the [place] FDA and has been authorized for detection and/or diagnosis of SARS-CoV-2 by FDA under an Emergency Use Authorization (EUA). This EUA will remain in effect (meaning this test can be used) for  the duration of the COVID-19 declaration under Section 564(b)(1) of the Act, 21 U.S.C. section 360bbb-3(b)(1), unless the authorization is terminated or revoked.  Performed at [facility], [address], [place], [place] [address]   Urine Culture     Status: Abnormal   Collection Time: 03/31/21  5:51 AM   Specimen: Urine, Clean Catch  Result Value Ref Range Status   Specimen Description URINE, CLEAN CATCH  Final   Special Requests NONE  Final   Culture (A)  Final    >=100,000 COLONIES/mL METHICILLIN RESISTANT STAPHYLOCOCCUS AUREUS 40,000 COLONIES/mL AEROCOCCUS SPECIES Standardized susceptibility testing for this organism is not available. Performed at [facility], [address], [place], [place] [address]    Report Status 04/02/2021 FINAL  Final   Organism ID, Bacteria METHICILLIN RESISTANT STAPHYLOCOCCUS AUREUS (A)  Final      Susceptibility   Methicillin resistant staphylococcus aureus - MIC*    CIPROFLOXACIN >=8 RESISTANT Resistant     GENTAMICIN <=0.5 SENSITIVE Sensitive     NITROFURANTOIN <=16 SENSITIVE Sensitive     OXACILLIN >=4 RESISTANT Resistant     TETRACYCLINE <=1 SENSITIVE Sensitive     VANCOMYCIN 1 SENSITIVE Sensitive     TRIMETH/SULFA <=10 SENSITIVE Sensitive     CLINDAMYCIN <=0.25  SENSITIVE Sensitive     RIFAMPIN <=0.5 SENSITIVE Sensitive     Inducible Clindamycin NEGATIVE Sensitive     * >=100,000 COLONIES/mL METHICILLIN RESISTANT STAPHYLOCOCCUS AUREUS  Culture, blood (Routine X 2) w Reflex to ID Panel     Status: Abnormal   Collection Time: 04/01/21 11:08 AM   Specimen: BLOOD  Result Value Ref Range Status   Specimen Description BLOOD LEFT ANTECUBITAL  Final   Special Requests   Final    BOTTLES DRAWN AEROBIC AND ANAEROBIC Blood Culture adequate volume   Culture  Setup Time   Final    GRAM POSITIVE COCCI IN CLUSTERS IN BOTH AEROBIC AND ANAEROBIC BOTTLES Organism ID to follow CRITICAL RESULT CALLED TO, READ BACK BY AND VERIFIED WITH: PARMD MIRANDA BRYK BY MESSAN H. AT 0552 02 7 27 2022 Performed at [facility], [address] [place], [place] [address]    Culture METHICILLIN RESISTANT STAPHYLOCOCCUS AUREUS (A)  Final   Report Status 04/04/2021 FINAL  Final   Organism ID, Bacteria METHICILLIN RESISTANT STAPHYLOCOCCUS AUREUS  Final      Susceptibility   Methicillin resistant staphylococcus aureus - MIC*    CIPROFLOXACIN >=8 RESISTANT Resistant     ERYTHROMYCIN >=8 RESISTANT Resistant     GENTAMICIN <=0.5 SENSITIVE Sensitive     OXACILLIN >=4 RESISTANT Resistant     TETRACYCLINE <=1 SENSITIVE Sensitive     VANCOMYCIN 1 SENSITIVE Sensitive     TRIMETH/SULFA <=10 SENSITIVE Sensitive     CLINDAMYCIN <=0.25 SENSITIVE Sensitive     RIFAMPIN <=0.5 SENSITIVE Sensitive     Inducible Clindamycin NEGATIVE Sensitive     * METHICILLIN RESISTANT STAPHYLOCOCCUS AUREUS  Blood Culture ID Panel (Reflexed)     Status: Abnormal   Collection Time: 04/01/21 11:08 AM  Result Value Ref Range Status   Enterococcus faecalis NOT DETECTED NOT DETECTED Final   Enterococcus Faecium NOT DETECTED NOT DETECTED Final   Listeria monocytogenes NOT DETECTED NOT DETECTED Final   Staphylococcus species DETECTED (A) NOT DETECTED Final    Comment: CRITICAL RESULT CALLED TO, READ BACK  BY AND VERIFIED WITH: PARMD MIRANDA BRYK BY MESSAN H. AT 0552 02 7 27 2022    Staphylococcus aureus (BCID)  DETECTED (A) NOT DETECTED Final    Comment: Methicillin (oxacillin)-resistant Staphylococcus aureus (MRSA). MRSA is predictably resistant to beta-lactam antibiotics (except ceftaroline). Preferred therapy is vancomycin unless clinically contraindicated. Patient requires contact precautions if  hospitalized. CRITICAL RESULT CALLED TO, READ BACK BY AND VERIFIED WITH: PARMD MIRANDA BRYK BY MESSAN H. AT 0552 02 7 27 2022    Staphylococcus epidermidis NOT DETECTED NOT DETECTED Final   Staphylococcus lugdunensis NOT DETECTED NOT DETECTED Final   Streptococcus species NOT DETECTED NOT DETECTED Final   Streptococcus agalactiae NOT DETECTED NOT DETECTED Final   Streptococcus pneumoniae NOT DETECTED NOT DETECTED Final   Streptococcus pyogenes NOT DETECTED NOT DETECTED Final   A.calcoaceticus-baumannii NOT DETECTED NOT DETECTED Final   Bacteroides fragilis NOT DETECTED NOT DETECTED Final   Enterobacterales NOT DETECTED NOT DETECTED Final   Enterobacter cloacae complex NOT DETECTED NOT DETECTED Final   Escherichia coli NOT DETECTED NOT DETECTED Final   Klebsiella aerogenes NOT DETECTED NOT DETECTED Final   Klebsiella oxytoca NOT DETECTED NOT DETECTED Final   Klebsiella pneumoniae NOT DETECTED NOT DETECTED Final   Proteus species NOT DETECTED NOT DETECTED Final   Salmonella species NOT DETECTED NOT DETECTED Final   Serratia marcescens NOT DETECTED NOT DETECTED Final   Haemophilus influenzae NOT DETECTED NOT DETECTED Final   Neisseria meningitidis NOT DETECTED NOT DETECTED Final   Pseudomonas aeruginosa NOT DETECTED NOT DETECTED Final   Stenotrophomonas maltophilia NOT DETECTED NOT DETECTED Final   Candida albicans NOT DETECTED NOT DETECTED Final   Candida auris NOT DETECTED NOT DETECTED Final   Candida glabrata NOT DETECTED NOT DETECTED Final   Candida krusei NOT DETECTED NOT DETECTED Final    Candida parapsilosis NOT DETECTED NOT DETECTED Final   Candida tropicalis NOT DETECTED NOT DETECTED Final   Cryptococcus neoformans/gattii NOT DETECTED NOT DETECTED Final   Meth resistant mecA/C and MREJ DETECTED (A) NOT DETECTED Final    Comment: CRITICAL RESULT CALLED TO, READ BACK BY AND VERIFIED WITH: PARMD MIRANDA BRYK BY MESSAN H. AT 0552 02 7 27 2022 Performed at [facility], [address] [place], [place] [address]   Culture, blood (Routine X 2) w Reflex to ID Panel     Status: Abnormal   Collection Time: 04/01/21 11:18 AM   Specimen: BLOOD RIGHT HAND  Result Value Ref Range Status   Specimen Description BLOOD RIGHT HAND  Final   Special Requests   Final    BOTTLES DRAWN AEROBIC ONLY Blood Culture adequate volume   Culture  Setup Time   Final    GRAM POSITIVE COCCI IN CLUSTERS AEROBIC BOTTLE ONLY CRITICAL VALUE NOTED.  VALUE IS CONSISTENT WITH PREVIOUSLY REPORTED AND CALLED VALUE.    Culture (A)  Final    STAPHYLOCOCCUS AUREUS SUSCEPTIBILITIES PERFORMED ON PREVIOUS CULTURE WITHIN THE LAST 5 DAYS. Performed at [facility], [address] [place], [place] [address]    Report Status 04/04/2021 FINAL  Final  Culture, blood (Routine X 2) w Reflex to ID Panel     Status: None (Preliminary result)   Collection Time: 04/02/21 11:50 PM   Specimen: BLOOD RIGHT HAND  Result Value Ref Range Status   Specimen Description BLOOD RIGHT HAND  Final   Special Requests   Final    BOTTLES DRAWN AEROBIC ONLY Blood Culture adequate volume   Culture   Final    NO GROWTH < 12 HOURS Performed at [facility], [address] [place], [place] [address]    Report  Status PENDING  Incomplete  Culture, blood (Routine X 2) w Reflex to ID Panel     Status: None (Preliminary result)   Collection Time: 04/03/21 12:09 AM   Specimen: BLOOD LEFT HAND  Result Value Ref Range Status   Specimen Description BLOOD LEFT HAND  Final   Special Requests   Final    BOTTLES DRAWN  AEROBIC ONLY Blood Culture adequate volume   Culture   Final    NO GROWTH < 12 HOURS Performed at [facility], [address] [place], [place] [id number]    Report Status PENDING  Incomplete    Studies/Results: MR BRAIN W WO CONTRAST  Result Date: 04/03/2021 CLINICAL DATA:  Initial evaluation for acute headache, papilledema, history of MRSA bacteremia. EXAM: MRI HEAD AND ORBITS WITHOUT AND WITH CONTRAST TECHNIQUE: Multiplanar, multiecho pulse sequences of the brain and surrounding structures were obtained without and with intravenous contrast. Multiplanar, multiecho pulse sequences of the orbits and surrounding structures were obtained including fat saturation techniques, before and after intravenous contrast administration. CONTRAST:  8.325mL GADAVIST GADOBUTROL 1 MMOL/ML IV SOLN COMPARISON:  Prior CT from 05/02/2011. FINDINGS: MRI HEAD FINDINGS Brain: Cerebral volume within normal limits for age. Scattered patchy T2/FLAIR hyperintensity seen involving the periventricular, deep, and subcortical white matter both cerebral hemispheres as well as the pons, most likely related chronic microvascular ischemic disease, moderate in nature. 7 mm acute ischemic infarcts seen involving the left caudate head (series 2, image 30). Few additional scattered subcentimeter ischemic infarcts noted involving the cortical and subcortical left parietal lobe (series 2, images 34, 33) as well as the left splenium (series 2, image 27). Suspected additional subtle acute to subacute ischemic infarct noted involving the right temporal lobe (series 2, image 19). Suspected subtle associated enhancement about the right temporal infarct (series 17, image 11). No other associated enhancement. No mass effect or associated hemorrhage. Otherwise, gray-white matter differentiation maintained. No encephalomalacia to suggest chronic cortical infarction elsewhere within the brain. No other foci of susceptibility artifact to suggest acute  or chronic intracranial hemorrhage. No mass lesion, midline shift or mass effect. No hydrocephalus or extra-axial fluid collection. No intraventricular effusion or debris. Pituitary gland suprasellar region within normal limits. Midline structures intact and normal. On corresponding orbit portion of this exam, there is question of diffuse left a meningeal enhancement involving both cerebral hemispheres (series 14, image 1). Finding not entirely certain, and could be technical in nature. No other abnormal enhancement. Vascular: Major intracranial vascular flow voids are well maintained. Skull and upper cervical spine: Craniocervical junction within normal limits. Bone marrow signal intensity normal. No scalp soft tissue abnormality. Other: Trace left mastoid effusion, of doubtful significance. Inner ear structures grossly normal. MRI ORBITS FINDINGS Orbits: Globes are symmetric in size with normal appearance and morphology bilaterally. Apparent asymmetric FLAIR signal abnormality involving the right globe felt to be artifactual on this exam, with no signal abnormality seen on corresponding sequences. Optic nerves within normal limits without intrinsic edema or abnormal enhancement. No abnormality about the optic nerve sheaths. Intraconal and extraconal fat well-maintained. Extra-ocular muscles symmetric and within normal limits. Lacrimal glands normal. No abnormality about the orbital apices. Cavernous sinus within normal limits. Superior orbital veins symmetric and unremarkable. Optic chiasm normally situated within the suprasellar cistern without abnormality. No evidence for acute infection about either orbit. Question diffuse leptomeningeal enhancement involving the visualized cerebral hemispheres as above. Visualized sinuses: Scattered mucosal thickening noted about the ethmoidal air cells. Visualized paranasal sinuses are otherwise clear.  Soft tissues: Unremarkable. IMPRESSION: 1. 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 No associated hemorrhage or mass effect. 2. Question diffuse leptomeningeal enhancement involving both cerebral hemispheres as above. Finding not entirely certain, and could be technical in nature on this exam. However, possible meningitis should be considered in the setting of MRSA bacteremia and scattered ischemic infarcts. Correlation with LP and CSF studies recommended for further evaluation. 3. No other acute intracranial abnormality. No other evidence for acute CNS or intraorbital infection. 4. Moderate chronic microvascular ischemic disease. Electronically Signed   By: Rise Mu M.D.   On: 04/03/2021 22:10   MR Lumbar Spine W Wo Contrast  Result Date: 04/03/2021 CLINICAL DATA:  Initial evaluation for intermittent left flank pain with radiation into the left lower back. EXAM: MRI LUMBAR SPINE WITHOUT AND WITH CONTRAST TECHNIQUE: Multiplanar and multiecho pulse sequences of the lumbar spine were obtained without and with intravenous contrast. CONTRAST:  8.57mL GADAVIST GADOBUTROL 1 MMOL/ML IV SOLN COMPARISON:  Prior CT from 03/31/2024. FINDINGS: Segmentation: Standard. Lowest well-formed disc space labeled the L5-S1 level. Alignment: Physiologic with preservation of the normal lumbar lordosis. No listhesis. Vertebrae: Vertebral body height maintained without acute or chronic fracture. Bone marrow signal intensity within normal limits. No worrisome osseous lesions. Joint effusions noted about the left greater than right L4-5 facets without significant marrow edema. No other abnormal marrow edema or enhancement to suggest osteomyelitis discitis. Conus medullaris and cauda equina: Conus extends to the L2 level. Conus and cauda equina appear normal. Paraspinal and other soft tissues: Diffuse edema and enhancement seen about the left greater than right lower  posterior paraspinous musculature, most pronounced about the left L4-5 facet (series 6, image 12) small collection emanating from the left L4-5 facet as below. No other soft tissue collections. Mild scattered perinephric and retroperitoneal stranding noted, of uncertain significance, also seen on prior CT. Left periaortic lymph node measures 1 cm in short access, upper limits of normal. Visualized visceral structures otherwise unremarkable. Disc levels: L1-2: Negative interspace. Minimal facet spurring. No canal or foraminal stenosis. L2-3: Mild disc bulge with disc desiccation. Small biforaminal annular fissures noted. Mild facet hypertrophy with associated trace joint effusions. No significant spinal stenosis. Foramina remain patent. L3-4: Disc desiccation with mild annular disc bulge. Superimposed small left foraminal to extraforaminal disc protrusion closely approximates the exiting left L3 nerve root (series 8, image 23). Mild facet spurring with associated trace joint effusions. Tiny T2 hyperintense lesions measuring 4-5 mm situated posterior to the L3-4 facets noted, possibly reflecting small synovial cysts (series 8, image 27). These are not in a position to cause neural compromise. No spinal stenosis. Foramina remain patent. L4-5: Mild disc bulge with disc desiccation. Superimposed small left foraminal disc protrusion closely approximates and/or contacts the exiting left L4 nerve root (series 7, image 13). Moderate left worse than right facet hypertrophy with associated small joint effusions. There is a superimposed cystic collection/lesion along the anteromedial aspect of the left L4-5 facet, measuring 1.2 x 0.5 x 1.3 cm (series 6, image 11). Lesion contacts and mildly displaces the descending left L5 nerve root in the left lateral recess with resultant mild left lateral recess stenosis. There is an additional T2 hyperintense lesion situated posterior to the L4-5 facet measuring 1.6 x 0.9 x 1.2 cm (series  8, image 33). These lesions demonstrate peripheral enhancement. 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 Possible septic arthritis with associated small collections difficult to exclude, and could be considered in the correct clinical setting. No other significant spinal stenosis. Mild left foraminal narrowing. Right neural foramina remains patent. L5-S1: Minimal disc bulge with disc desiccation. Small central annular fissure. Mild bilateral facet hypertrophy. No canal or lateral recess stenosis. Foramina remain patent. IMPRESSION: 1. Joint effusion with surrounding enhancement about the left L4-5 facet, with associated two cystic collections/lesions measuring up to 1.6 cm as above. 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 Possible septic arthritis with associated small collections difficult to exclude, and could be considered in the correct clinical setting. Secondary mass effect on the descending left L5 nerve root by one of these collections with resultant mild left lateral recess stenosis, which could contribute to left lower extremity symptoms. 2. Abnormal edema and enhancement within the left greater than right lower posterior paraspinous musculature, greatest about the left L4-5 facet. Findings suggestive of acute myositis, which could be either infectious or inflammatory in nature. Possible muscular injury/strain could also be considered in the correct clinical setting. 3. Additional small left foraminal to extraforaminal disc protrusions at L3-4 and L4-5, potentially affecting the exiting left L3 and L4 nerve roots respectively. 4. No other evidence for acute infection within the lumbar spine. Electronically Signed   By: Rise Mu M.D.   On: 04/03/2021 02:25   ECHO TEE  Result Date: 04/04/2021    TRANSESOPHOGEAL ECHO REPORT   Patient Name:   ANDERSEN MCKIVER Date of Exam:  04/04/2021 Medical Rec #:  [id number]       Height:       66.0 in Accession #:    [id number]      Weight:       185.0 lb Date of Birth:  [birth year]       BSA:          1.934 m Patient Age:    64 years        BP:           154/87 mmHg Patient Gender: F               HR:           71 bpm. Exam Location:  Inpatient Procedure: 3D Echo, Cardiac Doppler, Color Doppler, Saline Contrast Bubble Study            and Transesophageal Echo Indications:     Bacteremia R78.81  History:         Patient has prior history of Echocardiogram examinations, most                  recent 04/02/2021. CAD, COPD, Signs/Symptoms:Fever and Fatigue;                  Risk Factors:Hypertension, Diabetes, Dyslipidemia and Current                  Smoker.  Sonographer:     Elmarie Shiley Dance Referring Phys:  [id number] Loura Halt AMIN Diagnosing Phys: Lennie Odor MD PROCEDURE: After discussion of the risks and benefits of a TEE, an informed consent was obtained from the patient. TEE procedure time was 10 minutes. The transesophogeal probe was passed without difficulty through the esophogus of the patient. Imaged were obtained with the patient in a left lateral decubitus position. Local oropharyngeal anesthetic was provided with Cetacaine. Sedation performed by different physician. The patient was monitored while under deep sedation.  Anesthestetic sedation was provided intravenously by Anesthesiology: 96mg  of Propofol, 100mg  of Lidocaine. Image quality was excellent. The patient's vital signs; including heart rate, blood pressure, and oxygen saturation; remained stable throughout the procedure. The patient developed no complications during the procedure. IMPRESSIONS  1. Left ventricular ejection fraction, by estimation, is 55 to 60%. The left ventricle has normal function.  2. Right ventricular systolic function is normal. The right ventricular size is normal.  3. No left atrial/left atrial appendage thrombus was detected.  4. The mitral valve is grossly  normal. Mild mitral valve regurgitation. No evidence of mitral stenosis.  5. The aortic valve is tricuspid. Aortic valve regurgitation is not visualized. No aortic stenosis is present.  6. There is mild (Grade II) layered plaque involving the descending aorta.  7. Agitated saline contrast bubble study was negative, with no evidence of any interatrial shunt. Conclusion(s)/Recommendation(s): No evidence of vegetation/infective endocarditis on this transesophageal echocardiogram. FINDINGS  Left Ventricle: Left ventricular ejection fraction, by estimation, is 55 to 60%. The left ventricle has normal function. The left ventricular internal cavity size was normal in size. Right Ventricle: The right ventricular size is normal. No increase in right ventricular wall thickness. Right ventricular systolic function is normal. Left Atrium: Left atrial size was normal in size. No left atrial/left atrial appendage thrombus was detected. Right Atrium: Right atrial size was normal in size. Pericardium: Trivial pericardial effusion is present. Mitral Valve: The mitral valve is grossly normal. Mild mitral valve regurgitation. No evidence of mitral valve stenosis. There is no evidence of mitral valve vegetation. Tricuspid Valve: The tricuspid valve is normal in structure. Tricuspid valve regurgitation is trivial. No evidence of tricuspid stenosis. There is no evidence of tricuspid valve vegetation. Aortic Valve: The aortic valve is tricuspid. Aortic valve regurgitation is not visualized. No aortic stenosis is present. There is no evidence of aortic valve vegetation. Pulmonic Valve: The pulmonic valve was normal in structure. Pulmonic valve regurgitation is trivial. No evidence of pulmonic stenosis. There is no evidence of pulmonic valve vegetation. Aorta: The aortic root and ascending aorta are structurally normal, with no evidence of dilitation. There is mild (Grade II) layered plaque involving the descending aorta. Venous: The left  upper pulmonary vein, left lower pulmonary vein, right upper pulmonary vein and right lower pulmonary vein are normal. IAS/Shunts: No atrial level shunt detected by color flow Doppler. Agitated saline contrast was given intravenously to evaluate for intracardiac shunting. Agitated saline contrast bubble study was negative, with no evidence of any interatrial shunt.   AORTA Ao Root diam: 2.86 cm Ao Asc diam:  3.27 cm TRICUSPID VALVE TR Peak grad:   16.5 mmHg TR Vmax:        203.00 cm/s Lennie Odor MD Electronically signed by Lennie Odor MD Signature Date/Time: 04/04/2021/1:31:34 PM    Final    MR ORBITS W WO CONTRAST  Result Date: 04/03/2021 CLINICAL DATA:  Initial evaluation for acute headache, papilledema, history of MRSA bacteremia. EXAM: MRI HEAD AND ORBITS WITHOUT AND WITH CONTRAST TECHNIQUE: Multiplanar, multiecho pulse sequences of the brain and surrounding structures were obtained without and with intravenous contrast. Multiplanar, multiecho pulse sequences of the orbits and surrounding structures were obtained including fat saturation techniques, before and after intravenous contrast administration. CONTRAST:  8.64mL GADAVIST GADOBUTROL 1 MMOL/ML IV SOLN COMPARISON:  Prior CT from 05/02/2011. FINDINGS: MRI HEAD FINDINGS Brain: Cerebral volume within normal limits for age. Scattered patchy T2/FLAIR hyperintensity seen involving the periventricular, deep, and subcortical white matter both cerebral hemispheres  as well as the pons, most likely related chronic microvascular ischemic disease, moderate in nature. 7 mm acute ischemic infarcts seen involving the left caudate head (series 2, image 30). Few additional scattered subcentimeter ischemic infarcts noted involving the cortical and subcortical left parietal lobe (series 2, images 34, 33) as well as the left splenium (series 2, image 27). Suspected additional subtle acute to subacute ischemic infarct noted involving the right temporal lobe (series 2,  image 19). Suspected subtle associated enhancement about the right temporal infarct (series 17, image 11). No other associated enhancement. No mass effect or associated hemorrhage. Otherwise, gray-white matter differentiation maintained. No encephalomalacia to suggest chronic cortical infarction elsewhere within the brain. No other foci of susceptibility artifact to suggest acute or chronic intracranial hemorrhage. No mass lesion, midline shift or mass effect. No hydrocephalus or extra-axial fluid collection. No intraventricular effusion or debris. Pituitary gland suprasellar region within normal limits. Midline structures intact and normal. On corresponding orbit portion of this exam, there is question of diffuse left a meningeal enhancement involving both cerebral hemispheres (series 14, image 1). Finding not entirely certain, and could be technical in nature. No other abnormal enhancement. Vascular: Major intracranial vascular flow voids are well maintained. Skull and upper cervical spine: Craniocervical junction within normal limits. Bone marrow signal intensity normal. No scalp soft tissue abnormality. Other: Trace left mastoid effusion, of doubtful significance. Inner ear structures grossly normal. MRI ORBITS FINDINGS Orbits: Globes are symmetric in size with normal appearance and morphology bilaterally. Apparent asymmetric FLAIR signal abnormality involving the right globe felt to be artifactual on this exam, with no signal abnormality seen on corresponding sequences. Optic nerves within normal limits without intrinsic edema or abnormal enhancement. No abnormality about the optic nerve sheaths. Intraconal and extraconal fat well-maintained. Extra-ocular muscles symmetric and within normal limits. Lacrimal glands normal. No abnormality about the orbital apices. Cavernous sinus within normal limits. Superior orbital veins symmetric and unremarkable. Optic chiasm normally situated within the suprasellar cistern  without abnormality. No evidence for acute infection about either orbit. Question diffuse leptomeningeal enhancement involving the visualized cerebral hemispheres as above. Visualized sinuses: Scattered mucosal thickening noted about the ethmoidal air cells. Visualized paranasal sinuses are otherwise clear. Soft tissues: Unremarkable. IMPRESSION: 1. 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 No associated hemorrhage or mass effect. 2. Question diffuse leptomeningeal enhancement involving both cerebral hemispheres as above. Finding not entirely certain, and could be technical in nature on this exam. However, possible meningitis should be considered in the setting of MRSA bacteremia and scattered ischemic infarcts. Correlation with LP and CSF studies recommended for further evaluation. 3. No other acute intracranial abnormality. No other evidence for acute CNS or intraorbital infection. 4. Moderate chronic microvascular ischemic disease. Electronically Signed   By: Rise Mu M.D.   On: 04/03/2021 22:10      Assessment/Plan:  INTERVAL HISTORY: Patient underwent TEE this PM, she had no sonographic evidence of vegetation. She was noted to have mild MR and mild plaque in the descending aorta. She feels her lower extremity weakness is much improved on the L side but she has persistent, stable weakness of the RLE.    Principal Problem:   SIRS (systemic inflammatory response syndrome) (HCC) Active Problems:   Class 1 obesity due to excess calories with serious comorbidity and body mass index (BMI) of 30.0 to 30.9 in adult   Essential hypertension   Mixed hyperlipidemia  Coronary artery disease of native artery of native heart with stable angina pectoris (HCC)   Type 2 diabetes mellitus with hyperglycemia (HCC)   Hyponatremia   Acute pyelonephritis   MRSA bacteremia    Epidural abscess   Weakness of both lower extremities    Katie Serrano is a 66 y.o. female with  a PMH of obesity, HTN, asthma, and T2DM here with MRSA bacteremia with likely infection of the L4-5 facets. TEE without evidence of valvular vegetation or significant valvular compromise. She is s/p 3x days of ceftriaxone and 3 days of vancomycin.   --We will continue vancomycin for now, patient will need a PICC line and a total of four weeks of vancomycin from her negative blood cultures.    LOS: 3 days   Marolyn Haller Internal Medicine PGY-2 04/04/2021, 4:16 PM

## 2021-04-04 NOTE — Anesthesia Preprocedure Evaluation (Addendum)
Anesthesia Evaluation  Patient identified by MRN, date of birth, ID band Patient awake    Reviewed: Allergy & Precautions, NPO status   Airway Mallampati: II  TM Distance: >3 FB     Dental   Pulmonary shortness of breath, asthma , Current Smoker and Patient abstained from smoking.,    breath sounds clear to auscultation       Cardiovascular hypertension, + angina + CAD   Rhythm:Regular Rate:Normal     Neuro/Psych    GI/Hepatic Neg liver ROS, GERD  ,  Endo/Other  diabetes  Renal/GU Renal disease     Musculoskeletal   Abdominal   Peds  Hematology   Anesthesia Other Findings   Reproductive/Obstetrics                             Anesthesia Physical Anesthesia Plan  ASA: 3  Anesthesia Plan: General   Post-op Pain Management:    Induction: Intravenous  PONV Risk Score and Plan: 2 and Propofol infusion  Airway Management Planned: Nasal Cannula and Simple Face Mask  Additional Equipment:   Intra-op Plan:   Post-operative Plan:   Informed Consent: I have reviewed the patients History and Physical, chart, labs and discussed the procedure including the risks, benefits and alternatives for the proposed anesthesia with the patient or authorized representative who has indicated his/her understanding and acceptance.     Dental advisory given  Plan Discussed with: CRNA and Anesthesiologist  Anesthesia Plan Comments:         Anesthesia Quick Evaluation

## 2021-04-04 NOTE — CV Procedure (Signed)
    TRANSESOPHAGEAL ECHOCARDIOGRAM   NAME:  Katie Serrano    MRN: [id number] DOB:  [birth year]    ADMIT DATE: 03/30/2021  INDICATIONS: Bacteremia  PROCEDURE:   Informed consent was obtained prior to the procedure. The risks, benefits and alternatives for the procedure were discussed and the patient comprehended these risks.  Risks include, but are not limited to, cough, sore throat, vomiting, nausea, somnolence, esophageal and stomach trauma or perforation, bleeding, low blood pressure, aspiration, pneumonia, infection, trauma to the teeth and death.    Procedural time out performed. The oropharynx was anesthetized with topical 1% benzocaine.    Anesthesia was administered by Dr. Chilton Si.  The patient was administered 96 mg of propofol and 100 mg of lidocaine to achieve and maintain moderate conscious sedation.  The patient's heart rate, blood pressure, and oxygen saturation are monitored continuously during the procedure. The period of conscious sedation is 10 minutes, of which I was present face-to-face 100% of this time.   The transesophageal probe was inserted in the esophagus and stomach without difficulty and multiple views were obtained.   COMPLICATIONS:    There were no immediate complications.  KEY FINDINGS:  No evidence of endocarditis.  Negative bubble study. Normal LV/RV function.  Full report to follow. Further management per primary team.   Gerri Spore T. Flora Lipps, MD, [facility]  [facility]  [address] [place], [place] [address] [phone]  12:26 PM

## 2021-04-04 NOTE — Progress Notes (Signed)
Inpatient Diabetes Program Recommendations  AACE/ADA: New Consensus Statement on Inpatient Glycemic Control (2015)  Target Ranges:  Prepandial:   less than 140 mg/dL      Peak postprandial:   less than 180 mg/dL (1-2 hours)      Critically ill patients:  140 - 180 mg/dL   Lab Results  Component Value Date   GLUCAP 183 (H) 04/04/2021   HGBA1C 9.8 (H) 03/31/2021    Review of Glycemic Control Results for Katie Serrano, Katie Serrano (MRN [id number]) as of 04/04/2021 14:05  Ref. Range 04/03/2021 16:16 04/03/2021 21:12 04/04/2021 06:09 04/04/2021 11:05 04/04/2021 13:32  Glucose-Capillary Latest Ref Range: 70 - 99 mg/dL 638 (H) 756 (H) 433 (H) 218 (H) 183 (H)   Diabetes history: DM2 Outpatient Diabetes medications: Metformin 500 mg BID Current orders for Inpatient glycemic control: Semglee 10 units QD, Novolog 0-15 units TID and 0-5 QHS  Spoke with patient and husband at bedside.  She has just returned from a TEE and is sleepy.  Most questions answered by husband.  Reviewed patient's current A1c of 9.8% (average blood sugar of 235 mg/dL). Explained what a A1c is and what it measures. Also reviewed goal A1c with patient, importance of good glucose control @ home, and blood sugar goals.  Husband states the last A1C was around 8%.  She checks her CBG's 2 x a day.  Her CBG's have been elevated lately and she states it's because she has been sick.  Discussed possibility of insulin.  She is not interested in starting insulin at home at this time.  Encouraged her to work on decreasing her A1C.  She does not drink sugary beverages and admits to needing to limit her CHO's more.  She states she does not see her PCP every three months and needs to be better about that.  Discussed long and short term complications of uncontrolled blood sugars.  Might consider adding Amaryl 4 mg at discharge.    Will continue to follow while inpatient.  Thank you, Dulce Sellar, RN, BSN Diabetes Coordinator Inpatient Diabetes  Program [phone] (team pager from 8a-5p)

## 2021-04-04 NOTE — Therapy (Signed)
OT Cancellation Note  Patient Details Name: CHARMION HAPKE MRN: [id number] DOB: 14-Mar-1956   Cancelled Treatment:    Reason Eval/Treat Not Completed: Patient at procedure or test/ unavailable. Pt is off the floor for testing. Will check back next available date as schedule allows.  Charletta Cousin [place] Filice Beth Dixon, OTR/L 04/04/2021, 11:27 AM

## 2021-04-04 NOTE — Interval H&P Note (Signed)
History and Physical Interval Note:  04/04/2021 11:34 AM  Katie Serrano  has presented today for surgery, with the diagnosis of BACTEREMIA.  The various methods of treatment have been discussed with the patient and family. After consideration of risks, benefits and other options for treatment, the patient has consented to  Procedure(s): TRANSESOPHAGEAL ECHOCARDITRRROGRAM (TEE) (N/A) as a surgical intervention.  The patient's history has been reviewed, patient examined, no change in status, stable for surgery.  I have reviewed the patient's chart and labs.  Questions were answered to the patient's satisfaction.    NPO for TEE for bacteremia.   Gerri Spore T. Flora Lipps, MD, [facility]  [facility]  [address], [address] [place], [place] [address] [phone]  11:35 AM

## 2021-04-04 NOTE — Anesthesia Postprocedure Evaluation (Signed)
Anesthesia Post Note  Patient: Katie Serrano  Procedure(s) Performed: TRANSESOPHAGEAL ECHOCARDITRRROGRAM (TEE) BUBBLE STUDY     Patient location during evaluation: Endoscopy Anesthesia Type: General Level of consciousness: awake Pain management: pain level controlled Vital Signs Assessment: post-procedure vital signs reviewed and stable Respiratory status: spontaneous breathing Cardiovascular status: stable Postop Assessment: no apparent nausea or vomiting Anesthetic complications: no   No notable events documented.  Last Vitals:  Vitals:   04/04/21 1232 04/04/21 1242  BP: (!) 141/61 (!) 141/68  Pulse: 71 72  Resp: (!) 27 16  Temp: 36.9 C   SpO2: 96% 95%    Last Pain:  Vitals:   04/04/21 1242  TempSrc:   PainSc: 0-No pain                 Farin Buhman

## 2021-04-04 NOTE — Progress Notes (Signed)
  Echocardiogram Echocardiogram Transesophageal has been performed.  Katie Serrano 04/04/2021, 12:57 PM

## 2021-04-04 NOTE — Anesthesia Procedure Notes (Signed)
Procedure Name: MAC Date/Time: 04/04/2021 12:06 PM Performed by: Dorthea Cove, CRNA Pre-anesthesia Checklist: Patient identified, Emergency Drugs available, Suction available, Patient being monitored and Timeout performed Patient Re-evaluated:Patient Re-evaluated prior to induction Oxygen Delivery Method: Nasal cannula Preoxygenation: Pre-oxygenation with 100% oxygen Induction Type: IV induction Placement Confirmation: positive ETCO2 and CO2 detector Dental Injury: Teeth and Oropharynx as per pre-operative assessment

## 2021-04-04 NOTE — Progress Notes (Signed)
While completing skin assessment noticed pus filled blister on pt's right index finger. Same type of blister noted on pt's leg and back. Provider paged.

## 2021-04-04 NOTE — Transfer of Care (Signed)
Immediate Anesthesia Transfer of Care Note  Patient: Katie Serrano  Procedure(s) Performed: TRANSESOPHAGEAL ECHOCARDITRRROGRAM (TEE) BUBBLE STUDY  Patient Location: Endoscopy Unit  Anesthesia Type:MAC  Level of Consciousness: awake and alert   Airway & Oxygen Therapy: Patient Spontanous Breathing and Patient connected to nasal cannula oxygen  Post-op Assessment: Report given to RN and Post -op Vital signs reviewed and stable  Post vital signs: Reviewed and stable  Last Vitals:  Vitals Value Taken Time  BP 141/61 04/04/21 1232  Temp    Pulse 71 04/04/21 1233  Resp 16 04/04/21 1233  SpO2 96 % 04/04/21 1233  Vitals shown include unvalidated device data.  Last Pain:  Vitals:   04/04/21 1120  TempSrc: Tympanic  PainSc: 7       Patients Stated Pain Goal: 0 (78/58/85 0277)  Complications: No notable events documented.

## 2021-04-04 NOTE — Consult Note (Addendum)
NEURO HOSPITALIST CONSULT NOTE   Requestig physician: Dr. Jarvis Newcomer  Reason for Consult: Punctate acute infarcts in the left cerebral hemisphere and equivocally abnormal leptomeningeal enhancement in the context of MRSA bacteremia  HPI:                                                                                                                                          Katie Serrano is a 65 y.o. female with hypertension, hyperlipidemia, type 2 diabetes, COPD, and tobacco use disorder who was admitted on 03/31/21 for general malaise, decreased appetite, n/v, and fever (105.32F) and found to have sepsis.   She received 3 days of ceftriaxone (7/24-7/26). Blood and urine cultures returned positive for MRSA and was transitioned to vancomycin.  TTE was obtained on 7/27 and was negative for endocarditis. TEE was performed earlier today. No valvular vegetation was observed. Bubble study was negative.  MRI was obtained on 7/28 for significant lower extremity weakness (right greater than left) and  revealed suspected lumbar pathology with L4-5 facet septic arthritis, several fluiid collections and mass effect on MRI on L5 nerve and myositis near L4-5.Neurosurgery was consulted but did not recommend surgical intervention at this time.   Brain MRI was obtained 04/03/21 and showed scattered infarcts involving the left caudate head, left parietal lobe, left splenium, and right temporal lobe. It also showed leptomeningeal enhancement concerning for possible meningitis. Neurology was consulted for further evaluation.   On visiting with her today, she has a throbbing headache above her right eye, associated with tearing, that began around 2 days prior to admission. Denies vision change.  Denies neck pain or stiffness.  Denies photophobia.  She endorses bilateral lower extremity pain and weakness began around 2 days prior to admission. She has generalized myalgias.   Tmax this admission  99.55F. WBCs have trended down from admission from 24.5>>18 today.   Culture data 03/30/21 urine culture: >100,000 colonies MRSA 7/26 blood cultures: staph aureus 7/27 blood cultures: NGTD  Home medications include ASA and atorvastatin.   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 Relevant Hx: Course: Daily Update: Today's Plan:discussed with her that her sugars will increase and she has actively worked on her diet and diet and lost from 242 to 188 which I commend her on, she is to continue with her weight loss efforts despite taking this   Electronically signed by: Sandi Mealy    Past Surgical History:  Procedure Laterality Date   APPENDECTOMY     BUNIONECTOMY Right    LEFT HEART CATH AND CORONARY ANGIOGRAPHY N/A 03/12/2021   Procedure: LEFT HEART CATH AND CORONARY ANGIOGRAPHY;  Surgeon: Lennette Bihari, MD;  Location: [facility] INVASIVE CV LAB;  Service: Cardiovascular;  Laterality: N/A;   NECK SURGERY     SMALL INTESTINE SURGERY     TONSILLECTOMY      Family History  Problem Relation Age of Onset   Diabetes Mother    Heart attack Mother 20   Angina Father    Heart attack Father 42   Diabetes Paternal Grandmother     Social History:  reports  that she has been smoking cigarettes. She has been smoking an average of .5 packs per day. She has never used smokeless tobacco. She reports current alcohol use of about 1.0 standard drink of alcohol per week. She reports that she does not use drugs.  No Known Allergies  MEDICATIONS:                                                                                                                     No current facility-administered medications on file prior to encounter.   Current Outpatient Medications on File Prior to Encounter  Medication Sig Dispense Refill   albuterol (VENTOLIN HFA) 108 (90 Base) MCG/ACT inhaler Inhale 2 puffs into the lungs every 6 (six) hours as needed for wheezing or shortness of breath.     aspirin 81 MG chewable tablet Chew 81 mg by mouth daily.     Cinnamon 500 MG capsule Take 500 mg by mouth daily.     Green Tea 150 MG CAPS Take 150 mg by mouth daily.     ibuprofen (ADVIL) 800 MG tablet Take 800 mg by mouth every 6 (six) hours as needed for pain.     losartan (COZAAR) 50 MG tablet Take 50 mg by mouth at bedtime.     metFORMIN (GLUCOPHAGE-XR) 500 MG 24 hr tablet Take 500 mg by mouth 2 (two) times daily.     metoprolol tartrate (LOPRESSOR) 25 MG tablet Take 1 tablet (25 mg total) by mouth 2 (two) times daily. 180 tablet 3   Multiple Vitamin (MULTIVITAMIN) capsule Take 1 capsule by mouth daily.     nitroGLYCERIN (NITROSTAT) 0.4 MG SL tablet Place 1 tablet (0.4 mg total) under the tongue every 5 (five) minutes as needed. 25 tablet 3   Omega-3 1000 MG CAPS Take 1,000 mg by mouth daily.     amLODipine (NORVASC) 5 MG tablet Take 1 tablet (5 mg total) by mouth daily. (Patient not taking: No sig reported)  90 tablet 1   atorvastatin (LIPITOR) 80 MG tablet Take 1 tablet (80 mg total) by mouth daily. (Patient not taking: No sig reported) 90 tablet 1   [DISCONTINUED] pravastatin (PRAVACHOL) 20 MG tablet Take 1 tablet (20 mg total) by mouth every evening. 90 tablet 3      ROS:                                                                                                                                        Negative unless stated in the HPI   Blood pressure (!) 163/84, pulse 95, temperature 99.5 F (37.5 C), temperature source Oral, resp. rate 20, height  (1.676 m), weight 83.9 kg, SpO2 93 %.  General Examination:                                                                                                       Physical Exam  General: acutely ill appearing female in no acute distress HENT: no nuchal rigidity or stiffness. Negative Brudzinki's sign. No pain on ROM. dry MM.  Eyes: mild tearing of conjunctival injection of the right eye Cardiovascular- RRR Lungs-breathing comfortably on room air. Lungs clear.  Abdomen- soft, non-tender MSK: no pain on palpation of lumbar spine. Kernig's sign testing limited by lower extremity pain Skin-warm, dry. No rash on limited exam  Neurological Examination Mental Status: Alert, oriented. Normal speech pattern. Articulation likely affected by lack of dentures. Cranial Nerves: II: PERRL III,IV, VI: EOMs intact. Peripheral visual fields intact.  V,VII: smile symmetric, facial light touch sensation normal bilaterally VIII: hearing normal bilaterally IX,X: uvula midline XI: bilateral shoulder shrug normal XII: midline tongue extension Motor: Exam limited by pain. 5/5 strength in the bilateral upper extremities. 4/5 strength in the LLE. Unable to raise right leg against gravity which patient attributes to leg pain.  Sensory: sensation intact to the extremities Gait: deferred   Lab Results: Basic Metabolic Panel: Recent Labs  Lab 03/31/21 0442 03/31/21 1418 04/01/21 0504 04/02/21 0353 04/03/21 0015 04/04/21 0359  NA 125* 127* 132* 131* 133* 135  K 3.8 3.8 3.2* 3.5 3.3* 3.4*  CL 93* 92* 96* 98 101 101  CO2 17* 21* GLUCOSE 271* 271* 247* 207* 179* 221*  BUN CREATININE  0.95 0.95 0.87 0.87 0.88 0.81  CALCIUM 8.3* 8.8* 8.6* 8.4* 8.7* 8.7*  MG 2.1  --  2.3 2.3 2.2 2.0  CBC: Recent Labs  Lab 03/31/21 0442 04/01/21 0504 04/02/21 0353 04/03/21 0015 04/04/21 0359  WBC 21.5* 24.5* 23.7* 22.8* 18.0*  NEUTROABS 17.6*  --   --   --   --   HGB 14.0 12.9 12.5 12.5 12.8  HCT 42.1 38.6 36.6 36.2 37.0  MCV 87.9 85.4 85.3 85.2 84.1  PLT 214 239 273 350 401*    Cardiac Enzymes: Recent Labs  Lab 03/31/21 0442 04/01/21 1108  CKTOTAL 244* 62    Lipid Panel: No results for input(s): CHOL, TRIG, HDL, CHOLHDL, VLDL, LDLCALC in the last 168 hours.  Imaging: MR BRAIN W WO CONTRAST  Result Date: 04/03/2021 CLINICAL DATA:  Initial evaluation for acute headache, papilledema, history of MRSA bacteremia. EXAM: MRI HEAD AND ORBITS WITHOUT AND WITH CONTRAST TECHNIQUE: Multiplanar, multiecho pulse sequences of the brain and surrounding structures were obtained without and with intravenous contrast. Multiplanar, multiecho pulse sequences of the orbits and surrounding structures were obtained including fat saturation techniques, before and after intravenous contrast administration. CONTRAST:  8.47mL GADAVIST GADOBUTROL 1 MMOL/ML IV SOLN COMPARISON:  Prior CT from 05/02/2011. FINDINGS: MRI HEAD FINDINGS Brain: Cerebral volume within normal limits for age. Scattered patchy T2/FLAIR hyperintensity seen involving the periventricular, deep, and subcortical white matter both cerebral hemispheres as well as the pons, most likely related chronic microvascular ischemic disease, moderate in nature. 7 mm acute ischemic infarcts seen involving the left caudate head (series 2, image 30). Few additional scattered subcentimeter ischemic infarcts noted involving the cortical and subcortical left parietal lobe (series 2, images 34, 33) as well as the left splenium (series 2, image 27). Suspected additional subtle acute to subacute ischemic infarct noted involving the right temporal lobe (series 2,  image 19). Suspected subtle associated enhancement about the right temporal infarct (series 17, image 11). No other associated enhancement. No mass effect or associated hemorrhage. Otherwise, gray-white matter differentiation maintained. No encephalomalacia to suggest chronic cortical infarction elsewhere within the brain. No other foci of susceptibility artifact to suggest acute or chronic intracranial hemorrhage. No mass lesion, midline shift or mass effect. No hydrocephalus or extra-axial fluid collection. No intraventricular effusion or debris. Pituitary gland suprasellar region within normal limits. Midline structures intact and normal. On corresponding orbit portion of this exam, there is question of diffuse left a meningeal enhancement involving both cerebral hemispheres (series 14, image 1). Finding not entirely certain, and could be technical in nature. No other abnormal enhancement. Vascular: Major intracranial vascular flow voids are well maintained. Skull and upper cervical spine: Craniocervical junction within normal limits. Bone marrow signal intensity normal. No scalp soft tissue abnormality. Other: Trace left mastoid effusion, of doubtful significance. Inner ear structures grossly normal. MRI ORBITS FINDINGS Orbits: Globes are symmetric in size with normal appearance and morphology bilaterally. Apparent asymmetric FLAIR signal abnormality involving the right globe felt to be artifactual on this exam, with no signal abnormality seen on corresponding sequences. Optic nerves within normal limits without intrinsic edema or abnormal enhancement. No abnormality about the optic nerve sheaths. Intraconal and extraconal fat well-maintained. Extra-ocular muscles symmetric and within normal limits. Lacrimal glands normal. No abnormality about the orbital apices. Cavernous sinus within normal limits. Superior orbital veins symmetric and unremarkable. Optic chiasm normally situated within the suprasellar cistern  without abnormality. No evidence for acute infection about either orbit. Question diffuse leptomeningeal enhancement involving the visualized cerebral hemispheres as above. Visualized sinuses: Scattered mucosal thickening noted about the ethmoidal air cells. Visualized paranasal sinuses are otherwise clear. Soft tissues: Unremarkable. IMPRESSION:  1. 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 No associated hemorrhage or mass effect. 2. Question diffuse leptomeningeal enhancement involving both cerebral hemispheres as above. Finding not entirely certain, and could be technical in nature on this exam. However, possible meningitis should be considered in the setting of MRSA bacteremia and scattered ischemic infarcts. Correlation with LP and CSF studies recommended for further evaluation. 3. No other acute intracranial abnormality. No other evidence for acute CNS or intraorbital infection. 4. Moderate chronic microvascular ischemic disease. Electronically Signed   By: Rise Mu M.D.   On: 04/03/2021 22:10   MR Lumbar Spine W Wo Contrast  Result Date: 04/03/2021 CLINICAL DATA:  Initial evaluation for intermittent left flank pain with radiation into the left lower back. EXAM: MRI LUMBAR SPINE WITHOUT AND WITH CONTRAST TECHNIQUE: Multiplanar and multiecho pulse sequences of the lumbar spine were obtained without and with intravenous contrast. CONTRAST:  8.17mL GADAVIST GADOBUTROL 1 MMOL/ML IV SOLN COMPARISON:  Prior CT from 03/31/2024. FINDINGS: Segmentation: Standard. Lowest well-formed disc space labeled the L5-S1 level. Alignment: Physiologic with preservation of the normal lumbar lordosis. No listhesis. Vertebrae: Vertebral body height maintained without acute or chronic fracture. Bone marrow signal intensity within normal limits. No worrisome osseous lesions. Joint effusions noted  about the left greater than right L4-5 facets without significant marrow edema. No other abnormal marrow edema or enhancement to suggest osteomyelitis discitis. Conus medullaris and cauda equina: Conus extends to the L2 level. Conus and cauda equina appear normal. Paraspinal and other soft tissues: Diffuse edema and enhancement seen about the left greater than right lower posterior paraspinous musculature, most pronounced about the left L4-5 facet (series 6, image 12) small collection emanating from the left L4-5 facet as below. No other soft tissue collections. Mild scattered perinephric and retroperitoneal stranding noted, of uncertain significance, also seen on prior CT. Left periaortic lymph node measures 1 cm in short access, upper limits of normal. Visualized visceral structures otherwise unremarkable. Disc levels: L1-2: Negative interspace. Minimal facet spurring. No canal or foraminal stenosis. L2-3: Mild disc bulge with disc desiccation. Small biforaminal annular fissures noted. Mild facet hypertrophy with associated trace joint effusions. No significant spinal stenosis. Foramina remain patent. L3-4: Disc desiccation with mild annular disc bulge. Superimposed small left foraminal to extraforaminal disc protrusion closely approximates the exiting left L3 nerve root (series 8, image 23). Mild facet spurring with associated trace joint effusions. Tiny T2 hyperintense lesions measuring 4-5 mm situated posterior to the L3-4 facets noted, possibly reflecting small synovial cysts (series 8, image 27). These are not in a position to cause neural compromise. No spinal stenosis. Foramina remain patent. L4-5: Mild disc bulge with disc desiccation. Superimposed small left foraminal disc protrusion closely approximates and/or contacts the exiting left L4 nerve root (series 7, image 13). Moderate left worse than right facet hypertrophy with associated small joint effusions. There is a superimposed cystic  collection/lesion along the anteromedial aspect of the left L4-5 facet, measuring 1.2 x 0.5 x 1.3 cm (series 6, image 11). Lesion contacts and mildly displaces the descending left L5 nerve root in the left lateral recess with resultant mild left lateral recess stenosis. There is an additional T2 hyperintense lesion situated posterior to the L4-5 facet measuring 1.6 x 0.9 x 1.2 cm (series 8, image 33). These lesions demonstrate peripheral enhancement. Overall, findings are favored to reflect acute facet arthritis with associated synovial cysts. Possible septic arthritis  with associated small collections difficult to exclude, and could be considered in the correct clinical setting. No other significant spinal stenosis. Mild left foraminal narrowing. Right neural foramina remains patent. L5-S1: Minimal disc bulge with disc desiccation. Small central annular fissure. Mild bilateral facet hypertrophy. No canal or lateral recess stenosis. Foramina remain patent. IMPRESSION: 1. Joint effusion with surrounding enhancement about the left L4-5 facet, with associated two cystic collections/lesions measuring up to 1.6 cm as above. 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 Possible septic arthritis with associated small collections difficult to exclude, and could be considered in the correct clinical setting. Secondary mass effect on the descending left L5 nerve root by one of these collections with resultant mild left lateral recess stenosis, which could contribute to left lower extremity symptoms. 2. Abnormal edema and enhancement within the left greater than right lower posterior paraspinous musculature, greatest about the left L4-5 facet. Findings suggestive of acute myositis, which could be either infectious or inflammatory in nature. Possible muscular injury/strain could also be considered in the correct clinical setting. 3. Additional small left foraminal to  extraforaminal disc protrusions at L3-4 and L4-5, potentially affecting the exiting left L3 and L4 nerve roots respectively. 4. No other evidence for acute infection within the lumbar spine. Electronically Signed   By: Rise Mu M.D.   On: 04/03/2021 02:25   ECHOCARDIOGRAM COMPLETE  Result Date: 04/02/2021    ECHOCARDIOGRAM REPORT   Patient Name:   Katie Serrano Date of Exam: 04/02/2021 Medical Rec #:  [id number]       Height:       66.0 in Accession #:    [id number]      Weight:       185.0 lb Date of Birth:  Jan 28, 1956       BSA:          1.934 m Patient Age:    64 years        BP:           161/89 mmHg Patient Gender: F               HR:           88 bpm. Exam Location:  Inpatient Procedure: 2D Echo, Cardiac Doppler and Color Doppler Indications:    Bacteremia  History:        Patient has no prior history of Echocardiogram examinations.                 CAD, COPD, Signs/Symptoms:Fever, UTI, Fatigue and Bacteremia;                 Risk Factors:Hypertension, Dyslipidemia, Diabetes, Current                 Smoker and Obesity.  Sonographer:    Lavenia Atlas Referring Phys: [id number] [facility] AMIN  Sonographer Comments: Technically difficult study due to poor echo windows and patient is morbidly obese. IMPRESSIONS  1. Left ventricular ejection fraction, by estimation, is 60 to 65%. The left ventricle has normal function. The left ventricle has no regional wall motion abnormalities. There is mild left ventricular hypertrophy. Left ventricular diastolic parameters were normal.  2. Right ventricular systolic function is normal. The right ventricular size is normal. There is normal pulmonary artery systolic pressure. The estimated right ventricular systolic pressure is 25.7 mmHg.  3. The mitral valve is normal in structure. Trivial mitral valve regurgitation. No evidence of mitral stenosis.  4. The aortic valve is tricuspid. Aortic valve regurgitation is not visualized. No aortic stenosis is present.   5. The inferior vena cava is normal in size with greater than 50% respiratory variability, suggesting right atrial pressure of 3 mmHg. Conclusion(s)/Recommendation(s): No vegetation seen. If high clinical suspicion for endocarditis, recommend TEE. FINDINGS  Left Ventricle: Left ventricular ejection fraction, by estimation, is 60 to 65%. The left ventricle has normal function. The left ventricle has no regional wall motion abnormalities. The left ventricular internal cavity size was normal in size. There is  mild left ventricular hypertrophy. Left ventricular diastolic parameters were normal. Right Ventricle: The right ventricular size is normal. No increase in right ventricular wall thickness. Right ventricular systolic function is normal. There is normal pulmonary artery systolic pressure. The tricuspid regurgitant velocity is 2.38 m/s, and  with an assumed right atrial pressure of 3 mmHg, the estimated right ventricular systolic pressure is 25.7 mmHg. Left Atrium: Left atrial size was normal in size. Right Atrium: Right atrial size was normal in size. Pericardium: Trivial pericardial effusion is present. Presence of pericardial fat pad. Mitral Valve: The mitral valve is normal in structure. Trivial mitral valve regurgitation. No evidence of mitral valve stenosis. Tricuspid Valve: The tricuspid valve is normal in structure. Tricuspid valve regurgitation is trivial. Aortic Valve: The aortic valve is tricuspid. Aortic valve regurgitation is not visualized. No aortic stenosis is present. Pulmonic Valve: The pulmonic valve was not well visualized. Pulmonic valve regurgitation is not visualized. Aorta: The aortic root and ascending aorta are structurally normal, with no evidence of dilitation. Venous: The inferior vena cava is normal in size with greater than 50% respiratory variability, suggesting right atrial pressure of 3 mmHg. IAS/Shunts: The interatrial septum was not well visualized.  LEFT VENTRICLE PLAX 2D LVIDd:          4.60 cm  Diastology LVIDs:         2.80 cm  LV e' medial:    8.92 cm/s LV PW:         1.00 cm  LV E/e' medial:  10.4 LV IVS:        1.00 cm  LV e' lateral:   7.29 cm/s LVOT diam:     2.10 cm  LV E/e' lateral: 12.7 LV SV:         70 LV SV Index:   36 LVOT Area:     3.46 cm  RIGHT VENTRICLE RV Basal diam:  2.80 cm RV S prime:     7.83 cm/s TAPSE (M-mode): 1.4 cm LEFT ATRIUM             Index       RIGHT ATRIUM           Index LA diam:        3.40 cm 1.76 cm/m  RA Area:     11.40 cm LA Vol (A2C):   28.6 ml 14.78 ml/m RA Volume:   26.50 ml  13.70 ml/m LA Vol (A4C):   31.9 ml 16.49 ml/m LA Biplane Vol: 30.7 ml 15.87 ml/m  AORTIC VALVE LVOT Vmax:   98.90 cm/s LVOT Vmean:  67.600 cm/s LVOT VTI:    0.202 m  AORTA Ao Root diam: 2.80 cm MITRAL VALVE               TRICUSPID VALVE MV Area (PHT): 3.56 cm    TR Peak grad:   22.7 mmHg MV Decel Time: 213 msec    TR Vmax:  238.00 cm/s MV E velocity: 92.70 cm/s MV A velocity: 77.10 cm/s  SHUNTS MV E/A ratio:  1.20        Systemic VTI:  0.20 m                            Systemic Diam: 2.10 cm Epifanio Lesches MD Electronically signed by Epifanio Lesches MD Signature Date/Time: 04/02/2021/1:14:35 PM    Final    MR ORBITS W WO CONTRAST  Result Date: 04/03/2021 CLINICAL DATA:  Initial evaluation for acute headache, papilledema, history of MRSA bacteremia. EXAM: MRI HEAD AND ORBITS WITHOUT AND WITH CONTRAST TECHNIQUE: Multiplanar, multiecho pulse sequences of the brain and surrounding structures were obtained without and with intravenous contrast. Multiplanar, multiecho pulse sequences of the orbits and surrounding structures were obtained including fat saturation techniques, before and after intravenous contrast administration. CONTRAST:  8.48mL GADAVIST GADOBUTROL 1 MMOL/ML IV SOLN COMPARISON:  Prior CT from 05/02/2011. FINDINGS: MRI HEAD FINDINGS Brain: Cerebral volume within normal limits for age. Scattered patchy T2/FLAIR hyperintensity seen involving  the periventricular, deep, and subcortical white matter both cerebral hemispheres as well as the pons, most likely related chronic microvascular ischemic disease, moderate in nature. 7 mm acute ischemic infarcts seen involving the left caudate head (series 2, image 30). Few additional scattered subcentimeter ischemic infarcts noted involving the cortical and subcortical left parietal lobe (series 2, images 34, 33) as well as the left splenium (series 2, image 27). Suspected additional subtle acute to subacute ischemic infarct noted involving the right temporal lobe (series 2, image 19). Suspected subtle associated enhancement about the right temporal infarct (series 17, image 11). No other associated enhancement. No mass effect or associated hemorrhage. Otherwise, gray-white matter differentiation maintained. No encephalomalacia to suggest chronic cortical infarction elsewhere within the brain. No other foci of susceptibility artifact to suggest acute or chronic intracranial hemorrhage. No mass lesion, midline shift or mass effect. No hydrocephalus or extra-axial fluid collection. No intraventricular effusion or debris. Pituitary gland suprasellar region within normal limits. Midline structures intact and normal. On corresponding orbit portion of this exam, there is question of diffuse left a meningeal enhancement involving both cerebral hemispheres (series 14, image 1). Finding not entirely certain, and could be technical in nature. No other abnormal enhancement. Vascular: Major intracranial vascular flow voids are well maintained. Skull and upper cervical spine: Craniocervical junction within normal limits. Bone marrow signal intensity normal. No scalp soft tissue abnormality. Other: Trace left mastoid effusion, of doubtful significance. Inner ear structures grossly normal. MRI ORBITS FINDINGS Orbits: Globes are symmetric in size with normal appearance and morphology bilaterally. Apparent asymmetric FLAIR signal  abnormality involving the right globe felt to be artifactual on this exam, with no signal abnormality seen on corresponding sequences. Optic nerves within normal limits without intrinsic edema or abnormal enhancement. No abnormality about the optic nerve sheaths. Intraconal and extraconal fat well-maintained. Extra-ocular muscles symmetric and within normal limits. Lacrimal glands normal. No abnormality about the orbital apices. Cavernous sinus within normal limits. Superior orbital veins symmetric and unremarkable. Optic chiasm normally situated within the suprasellar cistern without abnormality. No evidence for acute infection about either orbit. Question diffuse leptomeningeal enhancement involving the visualized cerebral hemispheres as above. Visualized sinuses: Scattered mucosal thickening noted about the ethmoidal air cells. Visualized paranasal sinuses are otherwise clear. Soft tissues: Unremarkable. IMPRESSION: 1. Few scattered subcentimeter acute ischemic nonhemorrhagic infarcts involving the left caudate head, left parietal lobe, left splenium, and right temporal lobe  as above. No associated hemorrhage or mass effect. 2. Question diffuse leptomeningeal enhancement involving both cerebral hemispheres as above. Finding not entirely certain, and could be technical in nature on this exam. However, possible meningitis should be considered in the setting of MRSA bacteremia and scattered ischemic infarcts. Correlation with LP and CSF studies recommended for further evaluation. 3. No other acute intracranial abnormality. No other evidence for acute CNS or intraorbital infection. 4. Moderate chronic microvascular ischemic disease. Electronically Signed   By: Rise Mu M.D.   On: 04/03/2021 22:10    Summary:  Katie Serrano is a 65 year old female who was admitted on 03/31/21 for sepsis and found to have MRSA bacteremia/bacturia. Her hospitalization has been complicated by facet septic arthritis and  myositis with L5 nerve root compression. Brain MRI revealed scattered cortical and subcortical infarcts and questionable diffuse leptomeningeal enhancement concerning for meningitis. Neurology was consulted for further evaluation.   03/31/21 CT renal study: bilateral pyelonephritis  04/03/21 MRI L-spine: L4-5 acute facet arthritis with associated fluid collection with secondary mass effect on left L5 nerve root. Edema and enhancement of left greater than right lower posterior paraspinous musculature, greatest at the L4-5 facet level, suggestive of acute myositis.   04/03/21 MRI Brain: Few scattered sub-centimeter acute bilateral cortical and subcortical ischemic non-hemorrhagic infarcts involving the left caudate head, left parietal lobe, left splenium, and right temporal lobe. Question diffuse leptomeningeal enhancement involving both cerebral hemispheres. Moderate chronic microvascular ischemic disease.   04/03/21 MRI Orbits: no acute intraorbital infection  04/04/21 TEE: no evidence of vegetation or infective endocarditis. No left atrial or atrial appendage thrombus. No evidence of interatrial shunt with bubble study.  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 Her white count is continuing to improve and she has been afebrile since admission, and for >24h without tylenol.   In regards to the leptomeningeal enhancement noted on MRI: She has no nuchal rigidity, stiffness or neck pain on exam, which decreases suspicion for meningitis but it cannot be ruled out.  Would not recommend performing an LP as she has been on ABX long enough to  significantly increase the chance of a false negative. Also with no nuchal rigidity and the enhancement pattern of the meninges on MRI could also fall at one end of the normal range. Additionally, an LP would increase the risk of introduction of bacteria into the subarachnoid spaces given infectious findings on L-spine MRI at or near the levels where an LP would be obtained. If she does have meningitis, CSF cultures would be unlikely to change the management since culture data has already shown MRSA bacteremia. Discussed with Dr. Algis Liming from ID who agrees that LP will not change management. She will need a lengthy course of antibiotics, as outlined by ID.  MRI findings are highly suspicious of embolic phenomenon given their bilateral and scattered nature. TEE did not show evidence of infective endocarditis or valvular vegetation. Will need to consider alternative causes such as atrial fibrillation. No atrial thrombus present. Negative bubble study makes interartrial shunting less likely.   Recommendations: - Start aspirin 81mg  daily. Stroke Team to reassess in the AM and determine if DOAC should be started.  - CTA head/neck to  evaluate for atherosclerotic disease and possible severe stenoses.  - Stroke team to follow up in the morning - Continue Vancomycin per ID - Tele monitoring  Rylee Christian, MD Internal MedicinElige Serrano Resident PGY-3 [facility] Pager: [phone] 04/04/2021 11:58 AM       I have seen and examined the patient. I have reviewed and agree with the assessment and plan, and have made changes as needed. 65 year old female who was admitted on 03/31/21 for sepsis and found to have MRSA bacteremia/bacturia. Her hospitalization has been complicated by facet septic arthritis and myositis with L5 nerve root compression. Brain MRI revealed scattered cortical and subcortical infarcts and questionable diffuse leptomeningeal enhancement concerning for meningitis. No  meningismus on exam. DDx and recommendations as above.   Electronically signed: Dr. Caryl PinaEric Dekendrick Uzelac

## 2021-04-04 NOTE — Progress Notes (Signed)
Inpatient Diabetes Program Recommendations  AACE/ADA: New Consensus Statement on Inpatient Glycemic Control (2015)  Target Ranges:  Prepandial:   less than 140 mg/dL      Peak postprandial:   less than 180 mg/dL (1-2 hours)      Critically ill patients:  140 - 180 mg/dL   Lab Results  Component Value Date   GLUCAP 218 (H) 04/04/2021   HGBA1C 9.8 (H) 03/31/2021    Review of Glycemic Control  Diabetes history: DM 2 Outpatient Diabetes medications: Metformin 500 mg bid Current orders for Inpatient glycemic control:  Semglee 10 units daily Novolog 0-15 units tid + hs  A1c 9.8% on 7/25  Inpatient Diabetes Program Recommendations:    -  Increase semglee to 14 units  Thanks,  Christena Deem RN, MSN, BC-ADM Inpatient Diabetes Coordinator Team Pager [phone] (8a-5p)

## 2021-04-04 NOTE — Progress Notes (Signed)
PROGRESS NOTE  Katie Serrano  OTR:[id number] DOB: 02-Feb-1956 DOA: 03/30/2021 PCP: Gordan Payment., MD   Brief Narrative: Katie Serrano is a 65 y.o. female with a history of HTN, asthma, tobacco use, COPD, T2DM, HLD, OA, and GERD who presented to the ED with fever to 105F and malaise found to have sepsis due to MRSA UTI and bacteremia for which vancomycin has been given. TTE was negative 7/27, subsequent TEE negative 7/29. Due to R > L LE weakness, spinal MR was performed and revealed L4-5 facet septic arthritis, several fluiid collections and mass effect on L5 nerve and myositis near L4-5. Neurosurgery was consulted but did not recommend surgical intervention at this time. Brain MRI was obtained 04/03/21 and showed scattered infarcts involving the left caudate head, left parietal lobe, left splenium, and right temporal lobe. It also showed diffuse leptomeningeal enhancement concerning for meningitis. Neurology was consulted.  Plan currently is to continue IV antibiotics for 6-8 weeks after repeat blood cultures return negative.   Assessment & Plan: Principal Problem:   MRSA bacteremia Active Problems:   Class 1 obesity due to excess calories with serious comorbidity and body mass index (BMI) of 30.0 to 30.9 in adult   Essential hypertension   Mixed hyperlipidemia   Coronary artery disease of native artery of native heart with stable angina pectoris (HCC)   SIRS (systemic inflammatory response syndrome) (HCC)   Type 2 diabetes mellitus with hyperglycemia (HCC)   Hyponatremia   Acute pyelonephritis   Epidural abscess   Weakness of both lower extremities   Septic embolism (HCC)   Acute bacterial endocarditis  Sepsis due to MRSA pyelonephritis, bacteremia with septic arthritis, paraspinal myositis: TTE and subsequent TEE negative for vegetation, negative bubble study.  - Continue monitoring blood cultures from 7/27-7/28 NGTD. If remains negative x5 days, can insert PICC and continue 6-8  weeks IV abx.   B/L Neuropathic and cramping lower extremity pain: MR with concern for septic arthritis/acute facet arthritis, mass-effect of L5 nerve root, and marrow edema in the paraspinous musculature concerning for acute myositis. - Neurosurgery consulted, recommending against surgery/instrumentation.   Hyponatremia, suspect hypovolemia:  - Improved with IV fluids   Essential hypertension - Continue Norvasc 5 mg daily, losartan 50 mg bedtime, metoprolol 25 mg twice daily.  IV hydralazine as needed    Mixed hyperlipidemia - Statin stopped due to her allergy and concerns.    Coronary artery disease -Currently chest pain-free.  Continue home meds   Type 2 diabetes mellitus with hyperglycemia (HCC), hemoglobin A1c 9.8 -Diabetic diet.  Hold metformin.  Insulin sliding scale and Accu-Cheks -Increase Lantus 10 units daily  DVT prophylaxis: Lovenox Code Status: Full Family Communication: At bedside. Called daughter without answer. Disposition Plan:  Status is: Inpatient  Remains inpatient appropriate because:Ongoing diagnostic testing needed not appropriate for outpatient work up and Inpatient level of care appropriate due to severity of illness  Dispo: The patient is from: Home              Anticipated d/c is to: Home              Patient currently is not medically stable to d/c.   Difficult to place patient No  Consultants:  Neurosurgery ID Neurology  Procedures:  TEE  Antimicrobials: Vancomycin   Subjective: Movement in legs is slowly improving in the right more than the left. Lower back pain is moderate, constant, worse with some positions. No fever.   Objective: Vitals:   04/04/21 1232  04/04/21 1242 04/04/21 1250 04/04/21 1315  BP: (!) 141/61 (!) 141/68 (!) 177/67 (!) 176/76  Pulse: 71 72 80 78  Resp: (!) Temp: 98.5 F (36.9 C)     TempSrc: Oral     SpO2: 96% 95% 94% 93%  Weight:      Height:        Intake/Output Summary (Last 24 hours) at  04/04/2021 1734 Last data filed at 04/04/2021 1223 Gross per 24 hour  Intake 100 ml  Output 1100 ml  Net -1000 ml   Filed Weights   03/30/21 2345  Weight: 83.9 kg    Gen: 65 y.o. female in no distress  Pulm: Non-labored breathing. Clear to auscultation bilaterally.  CV: Regular rate and rhythm. No murmur, rub, or gallop. No JVD, no pedal edema. GI: Abdomen soft, non-tender, non-distended, with normoactive bowel sounds. No organomegaly or masses felt. Ext: Warm, no deformities Skin: No rashes, lesions or ulcers on visualized skin. Neuro: Alert and oriented. R > L distal LE weakness. No meningismus. Psych: Judgement and insight appear normal. Mood & affect appropriate.   Data Reviewed: I have personally reviewed following labs and imaging studies  CBC: Recent Labs  Lab 03/31/21 0442 04/01/21 0504 04/02/21 0353 04/03/21 0015 04/04/21 0359  WBC 21.5* 24.5* 23.7* 22.8* 18.0*  NEUTROABS 17.6*  --   --   --   --   HGB 14.0 12.9 12.5 12.5 12.8  HCT 42.1 38.6 36.6 36.2 37.0  MCV 87.9 85.4 85.3 85.2 84.1  PLT 214 239 273 350 401*   Basic Metabolic Panel: Recent Labs  Lab 03/31/21 0442 03/31/21 1418 04/01/21 0504 04/02/21 0353 04/03/21 0015 04/04/21 0359  NA 125* 127* 132* 131* 133* 135  K 3.8 3.8 3.2* 3.5 3.3* 3.4*  CL 93* 92* 96* 98 101 101  CO2 17* 21* GLUCOSE 271* 271* 247* 207* 179* 221*  BUN CREATININE 0.95 0.95 0.87 0.87 0.88 0.81  CALCIUM 8.3* 8.8* 8.6* 8.4* 8.7* 8.7*  MG 2.1  --  2.3 2.3 2.2 2.0   GFR: Estimated Creatinine Clearance: 75.5 mL/min (by C-G formula based on SCr of 0.81 mg/dL). Liver Function Tests: Recent Labs  Lab 03/31/21 0442  AST 36  ALT 52*  ALKPHOS 72  BILITOT 0.8  PROT 6.4*  ALBUMIN 2.7*   No results for input(s): LIPASE, AMYLASE in the last 168 hours. No results for input(s): AMMONIA in the last 168 hours. Coagulation Profile: No results for input(s): INR, PROTIME in the last 168 hours. Cardiac  Enzymes: Recent Labs  Lab 03/31/21 0442 04/01/21 1108  CKTOTAL 244* 62   BNP (last 3 results) No results for input(s): PROBNP in the last 8760 hours. HbA1C: No results for input(s): HGBA1C in the last 72 hours. CBG: Recent Labs  Lab 04/03/21 2112 04/04/21 0609 04/04/21 1105 04/04/21 1332 04/04/21 1604  GLUCAP 266* 235* 218* 183* 244*   Lipid Profile: No results for input(s): CHOL, HDL, LDLCALC, TRIG, CHOLHDL, LDLDIRECT in the last 72 hours. Thyroid Function Tests: No results for input(s): TSH, T4TOTAL, FREET4, T3FREE, THYROIDAB in the last 72 hours. Anemia Panel: No results for input(s): VITAMINB12, FOLATE, FERRITIN, TIBC, IRON, RETICCTPCT in the last 72 hours. Urine analysis:    Component Value Date/Time   COLORURINE YELLOW 03/31/2021 0239   APPEARANCEUR CLOUDY (A) 03/31/2021 0239   LABSPEC 1.009 03/31/2021 0239   PHURINE 5.0 03/31/2021 0239   GLUCOSEU >=500 (A)  03/31/2021 0239   HGBUR SMALL (A) 03/31/2021 0239   BILIRUBINUR NEGATIVE 03/31/2021 0239   KETONESUR 20 (A) 03/31/2021 0239   PROTEINUR 100 (A) 03/31/2021 0239   NITRITE NEGATIVE 03/31/2021 0239   LEUKOCYTESUR MODERATE (A) 03/31/2021 0239   Recent Results (from the past 240 hour(s))  Resp Panel by RT-PCR (Flu A&B, Covid) Nasopharyngeal Swab     Status: None   Collection Time: 03/31/21 12:21 AM   Specimen: Nasopharyngeal Swab; Nasopharyngeal(NP) swabs in vial transport medium  Result Value Ref Range Status   SARS Coronavirus 2 by RT PCR NEGATIVE NEGATIVE Final    Comment: (NOTE) SARS-CoV-2 target nucleic acids are NOT DETECTED.  The SARS-CoV-2 RNA is generally detectable in upper respiratory specimens during the acute phase of infection. The lowest concentration of SARS-CoV-2 viral copies this assay can detect is 138 copies/mL. A negative result does not preclude SARS-Cov-2 infection and should not be used as the sole basis for treatment or other patient management decisions. A negative result may occur  with  improper specimen collection/handling, submission of specimen other than nasopharyngeal swab, presence of viral mutation(s) within the areas targeted by this assay, and inadequate number of viral copies(<138 copies/mL). A negative result must be combined with clinical observations, patient history, and epidemiological information. The expected result is Negative.  Fact Sheet for Patients:  BloggerCourse.com  Fact Sheet for Healthcare Providers:  SeriousBroker.it  This test is no t yet approved or cleared by the [place] FDA and  has been authorized for detection and/or diagnosis of SARS-CoV-2 by FDA under an Emergency Use Authorization (EUA). This EUA will remain  in effect (meaning this test can be used) for the duration of the COVID-19 declaration under Section 564(b)(1) of the Act, 21 U.S.C.section 360bbb-3(b)(1), unless the authorization is terminated  or revoked sooner.       Influenza A by PCR NEGATIVE NEGATIVE Final   Influenza B by PCR NEGATIVE NEGATIVE Final    Comment: (NOTE) The Xpert Xpress SARS-CoV-2/FLU/RSV plus assay is intended as an aid in the diagnosis of influenza from Nasopharyngeal swab specimens and should not be used as a sole basis for treatment. Nasal washings and aspirates are unacceptable for Xpert Xpress SARS-CoV-2/FLU/RSV testing.  Fact Sheet for Patients: BloggerCourse.com  Fact Sheet for Healthcare Providers: SeriousBroker.it  This test is not yet approved or cleared by the [place] FDA and has been authorized for detection and/or diagnosis of SARS-CoV-2 by FDA under an Emergency Use Authorization (EUA). This EUA will remain in effect (meaning this test can be used) for the duration of the COVID-19 declaration under Section 564(b)(1) of the Act, 21 U.S.C. section 360bbb-3(b)(1), unless the authorization is terminated  or revoked.  Performed at [facility], [address], [place], [place] [address]   Urine Culture     Status: Abnormal   Collection Time: 03/31/21  5:51 AM   Specimen: Urine, Clean Catch  Result Value Ref Range Status   Specimen Description URINE, CLEAN CATCH  Final   Special Requests NONE  Final   Culture (A)  Final    >=100,000 COLONIES/mL METHICILLIN RESISTANT STAPHYLOCOCCUS AUREUS 40,000 COLONIES/mL AEROCOCCUS SPECIES Standardized susceptibility testing for this organism is not available. Performed at [facility], [address], [place], [place] [address]    Report Status 04/02/2021 FINAL  Final   Organism ID, Bacteria METHICILLIN RESISTANT STAPHYLOCOCCUS AUREUS (A)  Final      Susceptibility   Methicillin resistant staphylococcus aureus - MIC*  CIPROFLOXACIN >=8 RESISTANT Resistant     GENTAMICIN <=0.5 SENSITIVE Sensitive     NITROFURANTOIN <=16 SENSITIVE Sensitive     OXACILLIN >=4 RESISTANT Resistant     TETRACYCLINE <=1 SENSITIVE Sensitive     VANCOMYCIN 1 SENSITIVE Sensitive     TRIMETH/SULFA <=10 SENSITIVE Sensitive     CLINDAMYCIN <=0.25 SENSITIVE Sensitive     RIFAMPIN <=0.5 SENSITIVE Sensitive     Inducible Clindamycin NEGATIVE Sensitive     * >=100,000 COLONIES/mL METHICILLIN RESISTANT STAPHYLOCOCCUS AUREUS  Culture, blood (Routine X 2) w Reflex to ID Panel     Status: Abnormal   Collection Time: 04/01/21 11:08 AM   Specimen: BLOOD  Result Value Ref Range Status   Specimen Description BLOOD LEFT ANTECUBITAL  Final   Special Requests   Final    BOTTLES DRAWN AEROBIC AND ANAEROBIC Blood Culture adequate volume   Culture  Setup Time   Final    GRAM POSITIVE COCCI IN CLUSTERS IN BOTH AEROBIC AND ANAEROBIC BOTTLES Organism ID to follow CRITICAL RESULT CALLED TO, READ BACK BY AND VERIFIED WITH: PARMD MIRANDA BRYK BY MESSAN H. AT 0552 02 7 27 2022 Performed at [facility], [address], [place], [place] [address]    Culture METHICILLIN  RESISTANT STAPHYLOCOCCUS AUREUS (A)  Final   Report Status 04/04/2021 FINAL  Final   Organism ID, Bacteria METHICILLIN RESISTANT STAPHYLOCOCCUS AUREUS  Final      Susceptibility   Methicillin resistant staphylococcus aureus - MIC*    CIPROFLOXACIN >=8 RESISTANT Resistant     ERYTHROMYCIN >=8 RESISTANT Resistant     GENTAMICIN <=0.5 SENSITIVE Sensitive     OXACILLIN >=4 RESISTANT Resistant     TETRACYCLINE <=1 SENSITIVE Sensitive     VANCOMYCIN 1 SENSITIVE Sensitive     TRIMETH/SULFA <=10 SENSITIVE Sensitive     CLINDAMYCIN <=0.25 SENSITIVE Sensitive     RIFAMPIN <=0.5 SENSITIVE Sensitive     Inducible Clindamycin NEGATIVE Sensitive     * METHICILLIN RESISTANT STAPHYLOCOCCUS AUREUS  Blood Culture ID Panel (Reflexed)     Status: Abnormal   Collection Time: 04/01/21 11:08 AM  Result Value Ref Range Status   Enterococcus faecalis NOT DETECTED NOT DETECTED Final   Enterococcus Faecium NOT DETECTED NOT DETECTED Final   Listeria monocytogenes NOT DETECTED NOT DETECTED Final   Staphylococcus species DETECTED (A) NOT DETECTED Final    Comment: CRITICAL RESULT CALLED TO, READ BACK BY AND VERIFIED WITH: PARMD MIRANDA BRYK BY MESSAN H. AT 0552 02 7 27 2022    Staphylococcus aureus (BCID) DETECTED (A) NOT DETECTED Final    Comment: Methicillin (oxacillin)-resistant Staphylococcus aureus (MRSA). MRSA is predictably resistant to beta-lactam antibiotics (except ceftaroline). Preferred therapy is vancomycin unless clinically contraindicated. Patient requires contact precautions if  hospitalized. CRITICAL RESULT CALLED TO, READ BACK BY AND VERIFIED WITH: PARMD MIRANDA BRYK BY MESSAN H. AT 0552 02 7 27 2022    Staphylococcus epidermidis NOT DETECTED NOT DETECTED Final   Staphylococcus lugdunensis NOT DETECTED NOT DETECTED Final   Streptococcus species NOT DETECTED NOT DETECTED Final   Streptococcus agalactiae NOT DETECTED NOT DETECTED Final   Streptococcus pneumoniae NOT DETECTED NOT DETECTED Final    Streptococcus pyogenes NOT DETECTED NOT DETECTED Final   A.calcoaceticus-baumannii NOT DETECTED NOT DETECTED Final   Bacteroides fragilis NOT DETECTED NOT DETECTED Final   Enterobacterales NOT DETECTED NOT DETECTED Final   Enterobacter cloacae complex NOT DETECTED NOT DETECTED Final   Escherichia coli NOT DETECTED NOT DETECTED Final   Klebsiella aerogenes  NOT DETECTED NOT DETECTED Final   Klebsiella oxytoca NOT DETECTED NOT DETECTED Final   Klebsiella pneumoniae NOT DETECTED NOT DETECTED Final   Proteus species NOT DETECTED NOT DETECTED Final   Salmonella species NOT DETECTED NOT DETECTED Final   Serratia marcescens NOT DETECTED NOT DETECTED Final   Haemophilus influenzae NOT DETECTED NOT DETECTED Final   Neisseria meningitidis NOT DETECTED NOT DETECTED Final   Pseudomonas aeruginosa NOT DETECTED NOT DETECTED Final   Stenotrophomonas maltophilia NOT DETECTED NOT DETECTED Final   Candida albicans NOT DETECTED NOT DETECTED Final   Candida auris NOT DETECTED NOT DETECTED Final   Candida glabrata NOT DETECTED NOT DETECTED Final   Candida krusei NOT DETECTED NOT DETECTED Final   Candida parapsilosis NOT DETECTED NOT DETECTED Final   Candida tropicalis NOT DETECTED NOT DETECTED Final   Cryptococcus neoformans/gattii NOT DETECTED NOT DETECTED Final   Meth resistant mecA/C and MREJ DETECTED (A) NOT DETECTED Final    Comment: CRITICAL RESULT CALLED TO, READ BACK BY AND VERIFIED WITH: PARMD MIRANDA BRYK BY MESSAN H. AT 0552 02 7 27 2022 Performed at [facility], [address], [place], [place] [address]   Culture, blood (Routine X 2) w Reflex to ID Panel     Status: Abnormal   Collection Time: 04/01/21 11:18 AM   Specimen: BLOOD RIGHT HAND  Result Value Ref Range Status   Specimen Description BLOOD RIGHT HAND  Final   Special Requests   Final    BOTTLES DRAWN AEROBIC ONLY Blood Culture adequate volume   Culture  Setup Time   Final    GRAM POSITIVE COCCI IN CLUSTERS AEROBIC  BOTTLE ONLY CRITICAL VALUE NOTED.  VALUE IS CONSISTENT WITH PREVIOUSLY REPORTED AND CALLED VALUE.    Culture (A)  Final    STAPHYLOCOCCUS AUREUS SUSCEPTIBILITIES PERFORMED ON PREVIOUS CULTURE WITHIN THE LAST 5 DAYS. Performed at [facility], [address] [place], [place] [address]    Report Status 04/04/2021 FINAL  Final  Culture, blood (Routine X 2) w Reflex to ID Panel     Status: None (Preliminary result)   Collection Time: 04/02/21 11:50 PM   Specimen: BLOOD RIGHT HAND  Result Value Ref Range Status   Specimen Description BLOOD RIGHT HAND  Final   Special Requests   Final    BOTTLES DRAWN AEROBIC ONLY Blood Culture adequate volume   Culture   Final    NO GROWTH < 12 HOURS Performed at [facility], [address] [place], [place] [address]    Report Status PENDING  Incomplete  Culture, blood (Routine X 2) w Reflex to ID Panel     Status: None (Preliminary result)   Collection Time: 04/03/21 12:09 AM   Specimen: BLOOD LEFT HAND  Result Value Ref Range Status   Specimen Description BLOOD LEFT HAND  Final   Special Requests   Final    BOTTLES DRAWN AEROBIC ONLY Blood Culture adequate volume   Culture   Final    NO GROWTH < 12 HOURS Performed at [facility], [address], [place], [place] [address]    Report Status PENDING  Incomplete      Radiology Studies: MR BRAIN W WO CONTRAST  Result Date: 04/03/2021 CLINICAL DATA:  Initial evaluation for acute headache, papilledema, history of MRSA bacteremia. EXAM: MRI HEAD AND ORBITS WITHOUT AND WITH CONTRAST TECHNIQUE: Multiplanar, multiecho pulse sequences of the brain and surrounding structures were obtained without and with intravenous contrast. Multiplanar, multiecho  pulse sequences of the orbits and surrounding structures were obtained including fat saturation techniques, before and after intravenous contrast administration. CONTRAST:  8.41mL GADAVIST GADOBUTROL 1 MMOL/ML IV SOLN COMPARISON:  Prior CT from  05/02/2011. FINDINGS: MRI HEAD FINDINGS Brain: Cerebral volume within normal limits for age. Scattered patchy T2/FLAIR hyperintensity seen involving the periventricular, deep, and subcortical white matter both cerebral hemispheres as well as the pons, most likely related chronic microvascular ischemic disease, moderate in nature. 7 mm acute ischemic infarcts seen involving the left caudate head (series 2, image 30). Few additional scattered subcentimeter ischemic infarcts noted involving the cortical and subcortical left parietal lobe (series 2, images 34, 33) as well as the left splenium (series 2, image 27). Suspected additional subtle acute to subacute ischemic infarct noted involving the right temporal lobe (series 2, image 19). Suspected subtle associated enhancement about the right temporal infarct (series 17, image 11). No other associated enhancement. No mass effect or associated hemorrhage. Otherwise, gray-white matter differentiation maintained. No encephalomalacia to suggest chronic cortical infarction elsewhere within the brain. No other foci of susceptibility artifact to suggest acute or chronic intracranial hemorrhage. No mass lesion, midline shift or mass effect. No hydrocephalus or extra-axial fluid collection. No intraventricular effusion or debris. Pituitary gland suprasellar region within normal limits. Midline structures intact and normal. On corresponding orbit portion of this exam, there is question of diffuse left a meningeal enhancement involving both cerebral hemispheres (series 14, image 1). Finding not entirely certain, and could be technical in nature. No other abnormal enhancement. Vascular: Major intracranial vascular flow voids are well maintained. Skull and upper cervical spine: Craniocervical junction within normal limits. Bone marrow signal intensity normal. No scalp soft tissue abnormality. Other: Trace left mastoid effusion, of doubtful significance. Inner ear structures grossly  normal. MRI ORBITS FINDINGS Orbits: Globes are symmetric in size with normal appearance and morphology bilaterally. Apparent asymmetric FLAIR signal abnormality involving the right globe felt to be artifactual on this exam, with no signal abnormality seen on corresponding sequences. Optic nerves within normal limits without intrinsic edema or abnormal enhancement. No abnormality about the optic nerve sheaths. Intraconal and extraconal fat well-maintained. Extra-ocular muscles symmetric and within normal limits. Lacrimal glands normal. No abnormality about the orbital apices. Cavernous sinus within normal limits. Superior orbital veins symmetric and unremarkable. Optic chiasm normally situated within the suprasellar cistern without abnormality. No evidence for acute infection about either orbit. Question diffuse leptomeningeal enhancement involving the visualized cerebral hemispheres as above. Visualized sinuses: Scattered mucosal thickening noted about the ethmoidal air cells. Visualized paranasal sinuses are otherwise clear. Soft tissues: Unremarkable. IMPRESSION: 1. 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 No associated hemorrhage or mass effect. 2. Question diffuse leptomeningeal enhancement involving both cerebral hemispheres as above. Finding not entirely certain, and could be technical in nature on this exam. However, possible meningitis should be considered in the setting of MRSA bacteremia and scattered ischemic infarcts. Correlation with LP and CSF studies recommended for further evaluation. 3. No other acute intracranial abnormality. No other evidence for acute CNS or intraorbital infection. 4. Moderate chronic microvascular ischemic disease. Electronically Signed   By: Rise Mu M.D.   On: 04/03/2021 22:10   MR Lumbar Spine W Wo Contrast  Result Date:  04/03/2021 CLINICAL DATA:  Initial evaluation for intermittent left flank pain with radiation into the left lower back. EXAM: MRI LUMBAR SPINE WITHOUT AND WITH CONTRAST TECHNIQUE: Multiplanar  and multiecho pulse sequences of the lumbar spine were obtained without and with intravenous contrast. CONTRAST:  8.56mL GADAVIST GADOBUTROL 1 MMOL/ML IV SOLN COMPARISON:  Prior CT from 03/31/2024. FINDINGS: Segmentation: Standard. Lowest well-formed disc space labeled the L5-S1 level. Alignment: Physiologic with preservation of the normal lumbar lordosis. No listhesis. Vertebrae: Vertebral body height maintained without acute or chronic fracture. Bone marrow signal intensity within normal limits. No worrisome osseous lesions. Joint effusions noted about the left greater than right L4-5 facets without significant marrow edema. No other abnormal marrow edema or enhancement to suggest osteomyelitis discitis. Conus medullaris and cauda equina: Conus extends to the L2 level. Conus and cauda equina appear normal. Paraspinal and other soft tissues: Diffuse edema and enhancement seen about the left greater than right lower posterior paraspinous musculature, most pronounced about the left L4-5 facet (series 6, image 12) small collection emanating from the left L4-5 facet as below. No other soft tissue collections. Mild scattered perinephric and retroperitoneal stranding noted, of uncertain significance, also seen on prior CT. Left periaortic lymph node measures 1 cm in short access, upper limits of normal. Visualized visceral structures otherwise unremarkable. Disc levels: L1-2: Negative interspace. Minimal facet spurring. No canal or foraminal stenosis. L2-3: Mild disc bulge with disc desiccation. Small biforaminal annular fissures noted. Mild facet hypertrophy with associated trace joint effusions. No significant spinal stenosis. Foramina remain patent. L3-4: Disc desiccation with mild annular disc bulge. Superimposed small left  foraminal to extraforaminal disc protrusion closely approximates the exiting left L3 nerve root (series 8, image 23). Mild facet spurring with associated trace joint effusions. Tiny T2 hyperintense lesions measuring 4-5 mm situated posterior to the L3-4 facets noted, possibly reflecting small synovial cysts (series 8, image 27). These are not in a position to cause neural compromise. No spinal stenosis. Foramina remain patent. L4-5: Mild disc bulge with disc desiccation. Superimposed small left foraminal disc protrusion closely approximates and/or contacts the exiting left L4 nerve root (series 7, image 13). Moderate left worse than right facet hypertrophy with associated small joint effusions. There is a superimposed cystic collection/lesion along the anteromedial aspect of the left L4-5 facet, measuring 1.2 x 0.5 x 1.3 cm (series 6, image 11). Lesion contacts and mildly displaces the descending left L5 nerve root in the left lateral recess with resultant mild left lateral recess stenosis. There is an additional T2 hyperintense lesion situated posterior to the L4-5 facet measuring 1.6 x 0.9 x 1.2 cm (series 8, image 33). These lesions demonstrate peripheral enhancement. Overall, findings are favored to reflect acute facet arthritis with associated synovial cysts. Possible septic arthritis with associated small collections difficult to exclude, and could be considered in the correct clinical setting. No other significant spinal stenosis. Mild left foraminal narrowing. Right neural foramina remains patent. L5-S1: Minimal disc bulge with disc desiccation. Small central annular fissure. Mild bilateral facet hypertrophy. No canal or lateral recess stenosis. Foramina remain patent. IMPRESSION: 1. Joint effusion with surrounding enhancement about the left L4-5 facet, with associated two cystic collections/lesions measuring up to 1.6 cm as above. 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 Possible septic arthritis with associated small collections difficult to exclude, and could be considered in the correct clinical setting. Secondary mass effect on the descending left L5 nerve root by one of these collections with resultant mild left lateral recess stenosis, which could contribute to left lower extremity symptoms. 2. Abnormal edema and enhancement within the left greater than right lower  posterior paraspinous musculature, greatest about the left L4-5 facet. Findings suggestive of acute myositis, which could be either infectious or inflammatory in nature. Possible muscular injury/strain could also be considered in the correct clinical setting. 3. Additional small left foraminal to extraforaminal disc protrusions at L3-4 and L4-5, potentially affecting the exiting left L3 and L4 nerve roots respectively. 4. No other evidence for acute infection within the lumbar spine. Electronically Signed   By: Rise Mu M.D.   On: 04/03/2021 02:25   ECHO TEE  Result Date: 04/04/2021    TRANSESOPHOGEAL ECHO REPORT   Patient Name:   CHRYSTINE FROGGE Date of Exam: 04/04/2021 Medical Rec #:  [id number]       Height:       66.0 in Accession #:    [id number]      Weight:       185.0 lb Date of Birth:  1956/02/12       BSA:          1.934 m Patient Age:    64 years        BP:           154/87 mmHg Patient Gender: F               HR:           71 bpm. Exam Location:  Inpatient Procedure: 3D Echo, Cardiac Doppler, Color Doppler, Saline Contrast Bubble Study            and Transesophageal Echo Indications:     Bacteremia R78.81  History:         Patient has prior history of Echocardiogram examinations, most                  recent 04/02/2021. CAD, COPD, Signs/Symptoms:Fever and Fatigue;                  Risk Factors:Hypertension, Diabetes, Dyslipidemia and Current                  Smoker.  Sonographer:     Elmarie Shiley Dance Referring Phys:  [id number] Loura Halt AMIN Diagnosing Phys:  Lennie Odor MD PROCEDURE: After discussion of the risks and benefits of a TEE, an informed consent was obtained from the patient. TEE procedure time was 10 minutes. The transesophogeal probe was passed without difficulty through the esophogus of the patient. Imaged were obtained with the patient in a left lateral decubitus position. Local oropharyngeal anesthetic was provided with Cetacaine. Sedation performed by different physician. The patient was monitored while under deep sedation. Anesthestetic sedation was provided intravenously by Anesthesiology: 96mg  of Propofol, 100mg  of Lidocaine. Image quality was excellent. The patient's vital signs; including heart rate, blood pressure, and oxygen saturation; remained stable throughout the procedure. The patient developed no complications during the procedure. IMPRESSIONS  1. Left ventricular ejection fraction, by estimation, is 55 to 60%. The left ventricle has normal function.  2. Right ventricular systolic function is normal. The right ventricular size is normal.  3. No left atrial/left atrial appendage thrombus was detected.  4. The mitral valve is grossly normal. Mild mitral valve regurgitation. No evidence of mitral stenosis.  5. The aortic valve is tricuspid. Aortic valve regurgitation is not visualized. No aortic stenosis is present.  6. There is mild (Grade II) layered plaque involving the descending aorta.  7. Agitated saline contrast bubble study was negative, with no evidence of any interatrial shunt. Conclusion(s)/Recommendation(s): No evidence of vegetation/infective endocarditis on this transesophageal echocardiogram.  FINDINGS  Left Ventricle: Left ventricular ejection fraction, by estimation, is 55 to 60%. The left ventricle has normal function. The left ventricular internal cavity size was normal in size. Right Ventricle: The right ventricular size is normal. No increase in right ventricular wall thickness. Right ventricular systolic function is  normal. Left Atrium: Left atrial size was normal in size. No left atrial/left atrial appendage thrombus was detected. Right Atrium: Right atrial size was normal in size. Pericardium: Trivial pericardial effusion is present. Mitral Valve: The mitral valve is grossly normal. Mild mitral valve regurgitation. No evidence of mitral valve stenosis. There is no evidence of mitral valve vegetation. Tricuspid Valve: The tricuspid valve is normal in structure. Tricuspid valve regurgitation is trivial. No evidence of tricuspid stenosis. There is no evidence of tricuspid valve vegetation. Aortic Valve: The aortic valve is tricuspid. Aortic valve regurgitation is not visualized. No aortic stenosis is present. There is no evidence of aortic valve vegetation. Pulmonic Valve: The pulmonic valve was normal in structure. Pulmonic valve regurgitation is trivial. No evidence of pulmonic stenosis. There is no evidence of pulmonic valve vegetation. Aorta: The aortic root and ascending aorta are structurally normal, with no evidence of dilitation. There is mild (Grade II) layered plaque involving the descending aorta. Venous: The left upper pulmonary vein, left lower pulmonary vein, right upper pulmonary vein and right lower pulmonary vein are normal. IAS/Shunts: No atrial level shunt detected by color flow Doppler. Agitated saline contrast was given intravenously to evaluate for intracardiac shunting. Agitated saline contrast bubble study was negative, with no evidence of any interatrial shunt.   AORTA Ao Root diam: 2.86 cm Ao Asc diam:  3.27 cm TRICUSPID VALVE TR Peak grad:   16.5 mmHg TR Vmax:        203.00 cm/s Lennie OdorWesley O'Neal MD Electronically signed by Lennie OdorWesley O'Neal MD Signature Date/Time: 04/04/2021/1:31:34 PM    Final    MR ORBITS W WO CONTRAST  Result Date: 04/03/2021 CLINICAL DATA:  Initial evaluation for acute headache, papilledema, history of MRSA bacteremia. EXAM: MRI HEAD AND ORBITS WITHOUT AND WITH CONTRAST TECHNIQUE:  Multiplanar, multiecho pulse sequences of the brain and surrounding structures were obtained without and with intravenous contrast. Multiplanar, multiecho pulse sequences of the orbits and surrounding structures were obtained including fat saturation techniques, before and after intravenous contrast administration. CONTRAST:  8.745mL GADAVIST GADOBUTROL 1 MMOL/ML IV SOLN COMPARISON:  Prior CT from 05/02/2011. FINDINGS: MRI HEAD FINDINGS Brain: Cerebral volume within normal limits for age. Scattered patchy T2/FLAIR hyperintensity seen involving the periventricular, deep, and subcortical white matter both cerebral hemispheres as well as the pons, most likely related chronic microvascular ischemic disease, moderate in nature. 7 mm acute ischemic infarcts seen involving the left caudate head (series 2, image 30). Few additional scattered subcentimeter ischemic infarcts noted involving the cortical and subcortical left parietal lobe (series 2, images 34, 33) as well as the left splenium (series 2, image 27). Suspected additional subtle acute to subacute ischemic infarct noted involving the right temporal lobe (series 2, image 19). Suspected subtle associated enhancement about the right temporal infarct (series 17, image 11). No other associated enhancement. No mass effect or associated hemorrhage. Otherwise, gray-white matter differentiation maintained. No encephalomalacia to suggest chronic cortical infarction elsewhere within the brain. No other foci of susceptibility artifact to suggest acute or chronic intracranial hemorrhage. No mass lesion, midline shift or mass effect. No hydrocephalus or extra-axial fluid collection. No intraventricular effusion or debris. Pituitary gland suprasellar region within normal limits. Midline structures intact  and normal. On corresponding orbit portion of this exam, there is question of diffuse left a meningeal enhancement involving both cerebral hemispheres (series 14, image 1). Finding  not entirely certain, and could be technical in nature. No other abnormal enhancement. Vascular: Major intracranial vascular flow voids are well maintained. Skull and upper cervical spine: Craniocervical junction within normal limits. Bone marrow signal intensity normal. No scalp soft tissue abnormality. Other: Trace left mastoid effusion, of doubtful significance. Inner ear structures grossly normal. MRI ORBITS FINDINGS Orbits: Globes are symmetric in size with normal appearance and morphology bilaterally. Apparent asymmetric FLAIR signal abnormality involving the right globe felt to be artifactual on this exam, with no signal abnormality seen on corresponding sequences. Optic nerves within normal limits without intrinsic edema or abnormal enhancement. No abnormality about the optic nerve sheaths. Intraconal and extraconal fat well-maintained. Extra-ocular muscles symmetric and within normal limits. Lacrimal glands normal. No abnormality about the orbital apices. Cavernous sinus within normal limits. Superior orbital veins symmetric and unremarkable. Optic chiasm normally situated within the suprasellar cistern without abnormality. No evidence for acute infection about either orbit. Question diffuse leptomeningeal enhancement involving the visualized cerebral hemispheres as above. Visualized sinuses: Scattered mucosal thickening noted about the ethmoidal air cells. Visualized paranasal sinuses are otherwise clear. Soft tissues: Unremarkable. IMPRESSION: 1. 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 No associated hemorrhage or mass effect. 2. Question diffuse leptomeningeal enhancement involving both cerebral hemispheres as above. Finding not entirely certain, and could be technical in nature on this exam. However, possible meningitis should be considered in the setting of MRSA bacteremia and  scattered ischemic infarcts. Correlation with LP and CSF studies recommended for further evaluation. 3. No other acute intracranial abnormality. No other evidence for acute CNS or intraorbital infection. 4. Moderate chronic microvascular ischemic disease. Electronically Signed   By: Rise Mu M.D.   On: 04/03/2021 22:10    Scheduled Meds:  amLODipine  5 mg Oral Daily   aspirin  81 mg Oral Daily   enoxaparin (LOVENOX) injection  40 mg Subcutaneous Q24H   insulin aspart  0-15 Units Subcutaneous TID WC   insulin aspart  0-5 Units Subcutaneous QHS   insulin glargine-yfgn  10 Units Subcutaneous Daily   losartan  50 mg Oral QHS   metoprolol tartrate  25 mg Oral BID   multivitamin with minerals  1 tablet Oral Daily   omega-3 acid ethyl esters  1 g Oral Daily   Continuous Infusions:  vancomycin 1,500 mg (04/04/21 1001)     LOS: 3 days   Time spent: 35 minutes.  Tyrone Nine, MD [facility] [email or web address] 04/04/2021, 5:34 PM

## 2021-04-05 ENCOUNTER — Encounter (HOSPITAL_COMMUNITY): Payer: Self-pay | Admitting: Internal Medicine

## 2021-04-05 ENCOUNTER — Inpatient Hospital Stay (HOSPITAL_COMMUNITY): Payer: Medicare Other | Admitting: Certified Registered Nurse Anesthetist

## 2021-04-05 ENCOUNTER — Encounter (HOSPITAL_COMMUNITY)
Admission: EM | Disposition: A | Discharge status: Rehabilitation Facility | Payer: Self-pay | Source: Home / Self Care | Attending: Family Medicine

## 2021-04-05 DIAGNOSIS — I330 Acute and subacute infective endocarditis: Secondary | ICD-10-CM | POA: Diagnosis not present

## 2021-04-05 DIAGNOSIS — R21 Rash and other nonspecific skin eruption: Secondary | ICD-10-CM | POA: Diagnosis not present

## 2021-04-05 DIAGNOSIS — M25561 Pain in right knee: Secondary | ICD-10-CM | POA: Diagnosis not present

## 2021-04-05 DIAGNOSIS — E6609 Other obesity due to excess calories: Secondary | ICD-10-CM | POA: Diagnosis not present

## 2021-04-05 DIAGNOSIS — I6310 Cerebral infarction due to embolism of unspecified precerebral artery: Secondary | ICD-10-CM

## 2021-04-05 DIAGNOSIS — M2578 Osteophyte, vertebrae: Secondary | ICD-10-CM

## 2021-04-05 DIAGNOSIS — L989 Disorder of the skin and subcutaneous tissue, unspecified: Secondary | ICD-10-CM

## 2021-04-05 DIAGNOSIS — N10 Acute tubulo-interstitial nephritis: Secondary | ICD-10-CM | POA: Diagnosis not present

## 2021-04-05 DIAGNOSIS — R7881 Bacteremia: Secondary | ICD-10-CM | POA: Diagnosis not present

## 2021-04-05 DIAGNOSIS — I6340 Cerebral infarction due to embolism of unspecified cerebral artery: Secondary | ICD-10-CM | POA: Insufficient documentation

## 2021-04-05 HISTORY — PX: I & D EXTREMITY: SHX5045

## 2021-04-05 LAB — VANCOMYCIN, RANDOM: Vancomycin Rm: 15

## 2021-04-05 LAB — CBC
HCT: 36.4 % (ref 36.0–46.0)
HCT: 36.7 % (ref 36.0–46.0)
Hemoglobin: 12.1 g/dL (ref 12.0–15.0)
Hemoglobin: 12.5 g/dL (ref 12.0–15.0)
MCH: 28.6 pg (ref 26.0–34.0)
MCH: 29.4 pg (ref 26.0–34.0)
MCHC: 33.2 g/dL (ref 30.0–36.0)
MCHC: 34.1 g/dL (ref 30.0–36.0)
MCV: 86.1 fL (ref 80.0–100.0)
MCV: 86.4 fL (ref 80.0–100.0)
Platelets: 447 10*3/uL — ABNORMAL HIGH (ref 150–400)
Platelets: 499 10*3/uL — ABNORMAL HIGH (ref 150–400)
RBC: 4.23 MIL/uL (ref 3.87–5.11)
RBC: 4.25 MIL/uL (ref 3.87–5.11)
RDW: 14.2 % (ref 11.5–15.5)
RDW: 14.2 % (ref 11.5–15.5)
WBC: 19.7 10*3/uL — ABNORMAL HIGH (ref 4.0–10.5)
WBC: 23.4 10*3/uL — ABNORMAL HIGH (ref 4.0–10.5)
nRBC: 0 % (ref 0.0–0.2)
nRBC: 0 % (ref 0.0–0.2)

## 2021-04-05 LAB — SYNOVIAL CELL COUNT + DIFF, W/ CRYSTALS
Crystals, Fluid: NONE SEEN
Eosinophils-Synovial: 0 % (ref 0–1)
Lymphocytes-Synovial Fld: 10 % (ref 0–20)
Monocyte-Macrophage-Synovial Fluid: 9 % — ABNORMAL LOW (ref 50–90)
Neutrophil, Synovial: 81 % — ABNORMAL HIGH (ref 0–25)
WBC, Synovial: 2350 /mm3 — ABNORMAL HIGH (ref 0–200)

## 2021-04-05 LAB — BASIC METABOLIC PANEL
Anion gap: 9 (ref 5–15)
BUN: 10 mg/dL (ref 8–23)
CO2: 29 mmol/L (ref 22–32)
Calcium: 8.6 mg/dL — ABNORMAL LOW (ref 8.9–10.3)
Chloride: 98 mmol/L (ref 98–111)
Creatinine, Ser: 0.82 mg/dL (ref 0.44–1.00)
GFR, Estimated: >60 mL/min (ref >60)
Glucose, Bld: 218 mg/dL — ABNORMAL HIGH (ref 70–99)
Potassium: 3.3 mmol/L — ABNORMAL LOW (ref 3.5–5.1)
Sodium: 136 mmol/L (ref 135–145)

## 2021-04-05 LAB — MAGNESIUM: Magnesium: 2 mg/dL (ref 1.7–2.4)

## 2021-04-05 LAB — GLUCOSE, CAPILLARY
Glucose-Capillary: 220 mg/dL — ABNORMAL HIGH (ref 70–99)
Glucose-Capillary: 207 mg/dL — ABNORMAL HIGH (ref 70–99)
Glucose-Capillary: 145 mg/dL — ABNORMAL HIGH (ref 70–99)
Glucose-Capillary: 156 mg/dL — ABNORMAL HIGH (ref 70–99)
Glucose-Capillary: 379 mg/dL — ABNORMAL HIGH (ref 70–99)

## 2021-04-05 LAB — LIPID PANEL
Cholesterol: 87 mg/dL (ref 0–200)
HDL: 19 mg/dL — ABNORMAL LOW (ref >40)
LDL Cholesterol: 44 mg/dL (ref 0–99)
Total CHOL/HDL Ratio: 4.6 RATIO
Triglycerides: 121 mg/dL (ref <150)
VLDL: 24 mg/dL (ref 0–40)

## 2021-04-05 LAB — CREATININE, SERUM
Creatinine, Ser: 0.85 mg/dL (ref 0.44–1.00)
GFR, Estimated: >60 mL/min (ref >60)

## 2021-04-05 LAB — VANCOMYCIN, PEAK: Vancomycin Pk: 20 ug/mL — ABNORMAL LOW (ref 30–40)

## 2021-04-05 SURGERY — IRRIGATION AND DEBRIDEMENT EXTREMITY
Anesthesia: General | Laterality: Right

## 2021-04-05 MED ORDER — HYDROMORPHONE HCL 1 MG/ML IJ SOLN
1.0000 mg | INTRAMUSCULAR | Status: AC | PRN
Start: 2021-04-05 — End: 2021-04-06
  Administered 2021-04-05 – 2021-04-06 (×3): 1 mg via INTRAVENOUS
  Filled 2021-04-05 (×3): qty 1

## 2021-04-05 MED ORDER — LABETALOL HCL 5 MG/ML IV SOLN
INTRAVENOUS | Status: AC
  Filled 2021-04-05: qty 4

## 2021-04-05 MED ORDER — ONDANSETRON HCL 4 MG/2ML IJ SOLN: 4.0000 mg | Freq: Four times a day (QID) | INTRAMUSCULAR | Status: DC | PRN

## 2021-04-05 MED ORDER — CHLORHEXIDINE GLUCONATE 0.12 % MT SOLN
OROMUCOSAL | Status: AC
  Administered 2021-04-05: 15 mL via OROMUCOSAL
  Filled 2021-04-05: qty 15

## 2021-04-05 MED ORDER — DOCUSATE SODIUM 100 MG PO CAPS
100.0000 mg | ORAL_CAPSULE | Freq: Two times a day (BID) | ORAL | Status: DC
  Administered 2021-04-05 – 2021-04-10 (×7): 100 mg via ORAL
  Filled 2021-04-05 (×10): qty 1

## 2021-04-05 MED ORDER — BUPIVACAINE HCL (PF) 0.5 % IJ SOLN
INTRAMUSCULAR | Status: AC
  Filled 2021-04-05: qty 30

## 2021-04-05 MED ORDER — ONDANSETRON HCL 4 MG/2ML IJ SOLN
INTRAMUSCULAR | Status: DC | PRN
  Administered 2021-04-05: 4 mg via INTRAVENOUS

## 2021-04-05 MED ORDER — ONDANSETRON HCL 4 MG PO TABS: 4.0000 mg | ORAL_TABLET | Freq: Four times a day (QID) | ORAL | Status: DC | PRN

## 2021-04-05 MED ORDER — 0.9 % SODIUM CHLORIDE (POUR BTL) OPTIME
TOPICAL | Status: DC | PRN
  Administered 2021-04-05: 1000 mL

## 2021-04-05 MED ORDER — ENOXAPARIN SODIUM 40 MG/0.4ML IJ SOSY
40.0000 mg | PREFILLED_SYRINGE | INTRAMUSCULAR | Status: DC
  Administered 2021-04-06 – 2021-04-10 (×5): 40 mg via SUBCUTANEOUS
  Filled 2021-04-05 (×5): qty 0.4

## 2021-04-05 MED ORDER — PROPOFOL 10 MG/ML IV BOLUS
INTRAVENOUS | Status: AC
  Filled 2021-04-05: qty 20

## 2021-04-05 MED ORDER — PROPOFOL 10 MG/ML IV BOLUS
INTRAVENOUS | Status: DC | PRN
  Administered 2021-04-05: 130 mg via INTRAVENOUS

## 2021-04-05 MED ORDER — LIDOCAINE 2% (20 MG/ML) 5 ML SYRINGE
INTRAMUSCULAR | Status: DC | PRN
  Administered 2021-04-05: 60 mg via INTRAVENOUS

## 2021-04-05 MED ORDER — AMLODIPINE BESYLATE 10 MG PO TABS
10.0000 mg | ORAL_TABLET | Freq: Every day | ORAL | Status: DC
  Administered 2021-04-05: 10 mg via ORAL
  Filled 2021-04-05: qty 1

## 2021-04-05 MED ORDER — PHENYLEPHRINE 40 MCG/ML (10ML) SYRINGE FOR IV PUSH (FOR BLOOD PRESSURE SUPPORT)
PREFILLED_SYRINGE | INTRAVENOUS | Status: DC | PRN
  Administered 2021-04-05 (×2): 80 ug via INTRAVENOUS

## 2021-04-05 MED ORDER — LACTATED RINGERS IV SOLN: INTRAVENOUS | Status: DC

## 2021-04-05 MED ORDER — FENTANYL CITRATE (PF) 250 MCG/5ML IJ SOLN
INTRAMUSCULAR | Status: DC | PRN
  Administered 2021-04-05: 50 ug via INTRAVENOUS
  Administered 2021-04-05 (×4): 25 ug via INTRAVENOUS

## 2021-04-05 MED ORDER — METOCLOPRAMIDE HCL 5 MG/ML IJ SOLN: 5.0000 mg | Freq: Three times a day (TID) | INTRAMUSCULAR | Status: DC | PRN

## 2021-04-05 MED ORDER — CHLORHEXIDINE GLUCONATE 0.12 % MT SOLN: 15.0000 mL | Freq: Once | OROMUCOSAL | Status: AC

## 2021-04-05 MED ORDER — METOCLOPRAMIDE HCL 5 MG PO TABS: 5.0000 mg | ORAL_TABLET | Freq: Three times a day (TID) | ORAL | Status: DC | PRN

## 2021-04-05 MED ORDER — VANCOMYCIN HCL 750 MG/150ML IV SOLN
750.0000 mg | Freq: Three times a day (TID) | INTRAVENOUS | Status: DC
  Administered 2021-04-05 – 2021-04-10 (×15): 750 mg via INTRAVENOUS
  Filled 2021-04-05 (×17): qty 150

## 2021-04-05 MED ORDER — FENTANYL CITRATE (PF) 250 MCG/5ML IJ SOLN
INTRAMUSCULAR | Status: AC
  Filled 2021-04-05: qty 5

## 2021-04-05 MED ORDER — LIDOCAINE HCL (PF) 1 % IJ SOLN
5.0000 mL | Freq: Once | INTRAMUSCULAR | Status: DC
  Filled 2021-04-05: qty 5

## 2021-04-05 MED ORDER — SENNA 8.6 MG PO TABS
1.0000 | ORAL_TABLET | Freq: Two times a day (BID) | ORAL | Status: DC
  Administered 2021-04-05 – 2021-04-06 (×2): 8.6 mg via ORAL
  Filled 2021-04-05 (×2): qty 1

## 2021-04-05 MED ORDER — ORAL CARE MOUTH RINSE: 15.0000 mL | Freq: Once | OROMUCOSAL | Status: AC

## 2021-04-05 MED ORDER — LACTATED RINGERS IV SOLN: INTRAVENOUS | Status: DC | PRN

## 2021-04-05 SURGICAL SUPPLY — 34 items
BAG COUNTER SPONGE SURGICOUNT (BAG) ×2 IMPLANT
BAG SPNG CNTER NS LX DISP (BAG) ×1
BANDAGE ESMARK 6X9 LF (GAUZE/BANDAGES/DRESSINGS) ×1 IMPLANT
BNDG CMPR 9X6 STRL LF SNTH (GAUZE/BANDAGES/DRESSINGS) ×1
BNDG ESMARK 6X9 LF (GAUZE/BANDAGES/DRESSINGS) ×2
BNDG GAUZE ELAST 4 BULKY (GAUZE/BANDAGES/DRESSINGS) ×2 IMPLANT
COVER SURGICAL LIGHT HANDLE (MISCELLANEOUS) ×2 IMPLANT
CUFF TOURN SGL QUICK 18X4 (TOURNIQUET CUFF) IMPLANT
CUFF TOURN SGL QUICK 34 (TOURNIQUET CUFF)
CUFF TRNQT CYL 34X4.125X (TOURNIQUET CUFF) IMPLANT
DRSG AQUACEL AG ADV 3.5X 4 (GAUZE/BANDAGES/DRESSINGS) ×1 IMPLANT
DRSG PAD ABDOMINAL 8X10 ST (GAUZE/BANDAGES/DRESSINGS) ×2 IMPLANT
DURAPREP 26ML APPLICATOR (WOUND CARE) ×2 IMPLANT
GAUZE SPONGE 4X4 12PLY STRL (GAUZE/BANDAGES/DRESSINGS) ×2 IMPLANT
GLOVE SURG ENC TEXT LTX SZ7 (GLOVE) ×2 IMPLANT
GLOVE SURG ENC TEXT LTX SZ7.5 (GLOVE) IMPLANT
GLOVE SURG LTX SZ8 (GLOVE) IMPLANT
GLOVE SURG ORTHO LTX SZ7.5 (GLOVE) ×4 IMPLANT
GLOVE SURG ORTHO LTX SZ8.5 (GLOVE) ×6 IMPLANT
GLOVE SURG UNDER POLY LF SZ7.5 (GLOVE) ×4 IMPLANT
GLOVE SURG UNDER POLY LF SZ8.5 (GLOVE) ×2 IMPLANT
GOWN STRL REUS W/ TWL LRG LVL3 (GOWN DISPOSABLE) ×1 IMPLANT
GOWN STRL REUS W/ TWL XL LVL3 (GOWN DISPOSABLE) ×3 IMPLANT
GOWN STRL REUS W/TWL LRG LVL3 (GOWN DISPOSABLE) ×2
GOWN STRL REUS W/TWL XL LVL3 (GOWN DISPOSABLE) ×6
HANDPIECE INTERPULSE COAX TIP (DISPOSABLE) ×2
KIT BASIN OR (CUSTOM PROCEDURE TRAY) ×2 IMPLANT
MANIFOLD NEPTUNE II (INSTRUMENTS) ×2 IMPLANT
PACK ORTHO EXTREMITY (CUSTOM PROCEDURE TRAY) ×2 IMPLANT
PAD CAST 4YDX4 CTTN HI CHSV (CAST SUPPLIES) ×1 IMPLANT
PADDING CAST COTTON 4X4 STRL (CAST SUPPLIES) ×2
SET HNDPC FAN SPRY TIP SCT (DISPOSABLE) ×1 IMPLANT
SYR CONTROL 10ML LL (SYRINGE) ×2 IMPLANT
TOWEL GREEN STERILE (TOWEL DISPOSABLE) ×4 IMPLANT

## 2021-04-05 NOTE — Progress Notes (Signed)
Pharmacy Antibiotic Note  Katie Serrano is a 65 y.o. female admitted on 03/30/2021 with MRSA bacteremia.  Pharmacy has been consulted for vancomycin dosing.  Vancomycin "peak" level tonight drawn late but was only 20.  Level checked 2 hrs later = 15.  Calculated AUC only 333.  Plan: Increase vancomycin to 750 mg IV q 8 hrs. Will repeat levels at next steady-state.  Height: 5\' 6"  (167.6 cm) Weight: 80.7 kg (178 lb) IBW/kg (Calculated) : 59.3  Temp (24hrs), Avg:98.4 F (36.9 C), Min:97.4 F (36.3 C), Max:99.5 F (37.5 C)  Recent Labs  Lab 04/02/21 0353 04/03/21 0015 04/04/21 0359 04/05/21 0322 04/05/21 1802 04/05/21 2159  WBC 23.7* 22.8* 18.0* 19.7* 23.4*  --   CREATININE 0.87 0.88 0.81 0.82 0.85  --   VANCOPEAK  --   --   --   --  20*  --   VANCORANDOM  --   --   --   --   --  15    Estimated Creatinine Clearance: 70.7 mL/min (by C-G formula based on SCr of 0.85 mg/dL).    No Known Allergies   2160, Reece Leader, [facility] Clinical Pharmacist  04/05/2021 10:53 PM   [facility] pharmacy phone numbers are listed on [email or web address]

## 2021-04-05 NOTE — Progress Notes (Signed)
PROGRESS NOTE  Katie Serrano  RUE:[id number] DOB: 1956-01-12 DOA: 03/30/2021 PCP: Gordan Payment., MD   Brief Narrative: Katie Serrano is a 65 y.o. female with a history of HTN, asthma, tobacco use, COPD, T2DM, HLD, OA, and GERD who presented to the ED with fever to 105F and malaise found to have sepsis due to MRSA UTI and bacteremia for which vancomycin has been given. TTE was negative 7/27, subsequent TEE negative 7/29. Due to R > L LE weakness, spinal MR was performed and revealed L4-5 facet septic arthritis, several fluiid collections and mass effect on L5 nerve and myositis near L4-5. Neurosurgery was consulted but did not recommend surgical intervention at this time. Brain MRI was obtained 04/03/21 and showed scattered infarcts involving the left caudate head, left parietal lobe, left splenium, and right temporal lobe. It also showed diffuse leptomeningeal enhancement concerning for meningitis. Neurology was consulted. The patient's right knee has developed painful swelling acutely on 7/30 for which orthopedics is consulted.  Plan currently is to continue IV antibiotics for 6-8 weeks after repeat blood cultures return negative.   Assessment & Plan: Principal Problem:   MRSA bacteremia Active Problems:   Class 1 obesity due to excess calories with serious comorbidity and body mass index (BMI) of 30.0 to 30.9 in adult   Essential hypertension   Mixed hyperlipidemia   Coronary artery disease of native artery of native heart with stable angina pectoris (HCC)   SIRS (systemic inflammatory response syndrome) (HCC)   Type 2 diabetes mellitus with hyperglycemia (HCC)   Hyponatremia   Acute pyelonephritis   Epidural abscess   Weakness of both lower extremities   Septic embolism (HCC)   Acute bacterial endocarditis   Cerebral embolism with cerebral infarction  Sepsis due to MRSA pyelonephritis, bacteremia with acute facet arthritis, possible septic arthritis, paraspinal myositis: TTE and  subsequent TEE negative for vegetation, negative bubble study.  - Continue monitoring blood cultures from 7/27-7/28. NGTD on last report when checked this morning. If remains negative x5 days, can insert PICC and continue 6-8 weeks IV abx.   Right knee swelling, pain, acute on 7/30: Strong suspicion for septic arthritis.  - Orthopedics, Dr. Linna Caprice consulted for arthrocentesis. D/w RN to gather supplies as he's ordered.   Pustular lesions: Typical of MRSA boils consistent with patient's bacteremia. With rising monkeypox cases, this was considered though less likely given her clinical setting, that they did not evolve from macules/papules/vesicles, they are not painful.  - D/w ID, Dr. Renold Don who will evaluate. No changes in isolation (on contact isolation for MRSA and universal masking policy in place) currently recommended.   B/L Neuropathic and cramping lower extremity pain: MR with concern for septic arthritis/acute facet arthritis, mass-effect of L5 nerve root, and marrow edema in the paraspinous musculature concerning for acute myositis. - Neurosurgery consulted, recommending against surgery/instrumentation.   Hyponatremia, suspect hypovolemia: Resolved.   Essential hypertension - Increase norvasc  > .  - Continue losartan 50 mg bedtime - Continue metoprolol 25 mg BID.  - Continue IV hydralazine as needed    Mixed hyperlipidemia - Statin stopped due to her allergy and concerns. Continue lovaza   CAD: No angina.  - Continue BB, lovaza, ASA .    Uncontrolled T2DM with hyperglycemia:  - Tight glycemic control to decrease mortality with bacteremia. Hold metformin.  - Increased lantus to 14u, continue SSI and monitoring for need for adjustments.   DVT prophylaxis: Lovenox Code Status: Full Family Communication: None at bedside this  morning.  Disposition Plan:  Status is: Inpatient  Remains inpatient appropriate because:Ongoing diagnostic testing needed not appropriate for  outpatient work up and Inpatient level of care appropriate due to severity of illness  Dispo: The patient is from: Home              Anticipated d/c is to: Home              Patient currently is not medically stable to d/c.   Difficult to place patient No  Consultants:  Neurosurgery ID Neurology Orthopedic surgery  Procedures:  TEE Bedside arthrocentesis planned.  Antimicrobials: Vancomycin   Subjective: Movement in legs is slowly improving in the right more than the left. Lower back pain is moderate, constant, worse with some positions. No fever.   Objective: Vitals:   04/04/21 1846 04/04/21 2342 04/05/21 0610 04/05/21 0633  BP: (!) 154/74 (!) 149/75 (!) 184/83 (!) 180/67  Pulse: 81 75 82   Resp: 16 19    Temp: 98.7 F (37.1 C) 99.5 F (37.5 C) 99.4 F (37.4 C)   TempSrc: Oral Oral Oral   SpO2: 94% 92%  93%  Weight:      Height:        Intake/Output Summary (Last 24 hours) at 04/05/2021 0905 Last data filed at 04/05/2021 0612 Gross per 24 hour  Intake 820 ml  Output 2200 ml  Net -1380 ml   Filed Weights   03/30/21 2345  Weight: 83.9 kg   Gen: 65 y.o. female in no distress Pulm: Nonlabored breathing room air. Clear. CV: Regular rate and rhythm. Soft systolic murmur at the base without rub, or gallop. No JVD, no dependent edema. GI: Abdomen soft, non-tender, non-distended, with normoactive bowel sounds.  Ext: Right knee with significant effusion, tender, warm, no erythema or ecchymosis.  Skin: New pustule on right index finger Subcentimeter with minimal surrounding erythema, no tenderness. Left knee and proximal to knee has 2 very small pustules also intact and nontender without erythema.   Neuro: Alert and oriented. No new focal neurological deficits. R > L LE weakness is stable, RLE slightly worse than yesterday likely due to knee pain. Psych: Judgement and insight appear fair. Mood euthymic & affect congruent. Behavior is appropriate.    Data Reviewed: I  have personally reviewed following labs and imaging studies  CBC: Recent Labs  Lab 03/31/21 0442 04/01/21 0504 04/02/21 0353 04/03/21 0015 04/04/21 0359 04/05/21 0322  WBC 21.5* 24.5* 23.7* 22.8* 18.0* 19.7*  NEUTROABS 17.6*  --   --   --   --   --   HGB 14.0 12.9 12.5 12.5 12.8 12.1  HCT 42.1 38.6 36.6 36.2 37.0 36.4  MCV 87.9 85.4 85.3 85.2 84.1 86.1  PLT 214 239 273 350 401* 447*   Basic Metabolic Panel: Recent Labs  Lab 04/01/21 0504 04/02/21 0353 04/03/21 0015 04/04/21 0359 04/05/21 0322  NA 132* 131* 133* 135 136  K 3.2* 3.5 3.3* 3.4* 3.3*  CL 96* 98 101 101 98  CO2 GLUCOSE 247* 207* 179* 221* 218*  BUN CREATININE 0.87 0.87 0.88 0.81 0.82  CALCIUM 8.6* 8.4* 8.7* 8.7* 8.6*  MG 2.3 2.3 2.2 2.0 2.0   GFR: Estimated Creatinine Clearance: 74.6 mL/min (by C-G formula based on SCr of 0.82 mg/dL). Liver Function Tests: Recent Labs  Lab 03/31/21 0442  AST 36  ALT 52*  ALKPHOS 72  BILITOT 0.8  PROT 6.4*  ALBUMIN  2.7*   No results for input(s): LIPASE, AMYLASE in the last 168 hours. No results for input(s): AMMONIA in the last 168 hours. Coagulation Profile: No results for input(s): INR, PROTIME in the last 168 hours. Cardiac Enzymes: Recent Labs  Lab 03/31/21 0442 04/01/21 1108  CKTOTAL 244* 62   BNP (last 3 results) No results for input(s): PROBNP in the last 8760 hours. HbA1C: No results for input(s): HGBA1C in the last 72 hours. CBG: Recent Labs  Lab 04/04/21 1105 04/04/21 1332 04/04/21 1604 04/04/21 2127 04/05/21 0604  GLUCAP 218* 183* 244* 204* 220*   Lipid Profile: No results for input(s): CHOL, HDL, LDLCALC, TRIG, CHOLHDL, LDLDIRECT in the last 72 hours. Thyroid Function Tests: No results for input(s): TSH, T4TOTAL, FREET4, T3FREE, THYROIDAB in the last 72 hours. Anemia Panel: No results for input(s): VITAMINB12, FOLATE, FERRITIN, TIBC, IRON, RETICCTPCT in the last 72 hours. Urine analysis:     Component Value Date/Time   COLORURINE YELLOW 03/31/2021 0239   APPEARANCEUR CLOUDY (A) 03/31/2021 0239   LABSPEC 1.009 03/31/2021 0239   PHURINE 5.0 03/31/2021 0239   GLUCOSEU >=500 (A) 03/31/2021 0239   HGBUR SMALL (A) 03/31/2021 0239   BILIRUBINUR NEGATIVE 03/31/2021 0239   KETONESUR 20 (A) 03/31/2021 0239   PROTEINUR 100 (A) 03/31/2021 0239   NITRITE NEGATIVE 03/31/2021 0239   LEUKOCYTESUR MODERATE (A) 03/31/2021 0239   Recent Results (from the past 240 hour(s))  Resp Panel by RT-PCR (Flu A&B, Covid) Nasopharyngeal Swab     Status: None   Collection Time: 03/31/21 12:21 AM   Specimen: Nasopharyngeal Swab; Nasopharyngeal(NP) swabs in vial transport medium  Result Value Ref Range Status   SARS Coronavirus 2 by RT PCR NEGATIVE NEGATIVE Final    Comment: (NOTE) SARS-CoV-2 target nucleic acids are NOT DETECTED.  The SARS-CoV-2 RNA is generally detectable in upper respiratory specimens during the acute phase of infection. The lowest concentration of SARS-CoV-2 viral copies this assay can detect is 138 copies/mL. A negative result does not preclude SARS-Cov-2 infection and should not be used as the sole basis for treatment or other patient management decisions. A negative result may occur with  improper specimen collection/handling, submission of specimen other than nasopharyngeal swab, presence of viral mutation(s) within the areas targeted by this assay, and inadequate number of viral copies(<138 copies/mL). A negative result must be combined with clinical observations, patient history, and epidemiological information. The expected result is Negative.  Fact Sheet for Patients:  BloggerCourse.com  Fact Sheet for Healthcare Providers:  SeriousBroker.it  This test is no t yet approved or cleared by the [place] FDA and  has been authorized for detection and/or diagnosis of SARS-CoV-2 by FDA under an Emergency Use  Authorization (EUA). This EUA will remain  in effect (meaning this test can be used) for the duration of the COVID-19 declaration under Section 564(b)(1) of the Act, 21 U.S.C.section 360bbb-3(b)(1), unless the authorization is terminated  or revoked sooner.       Influenza A by PCR NEGATIVE NEGATIVE Final   Influenza B by PCR NEGATIVE NEGATIVE Final    Comment: (NOTE) The Xpert Xpress SARS-CoV-2/FLU/RSV plus assay is intended as an aid in the diagnosis of influenza from Nasopharyngeal swab specimens and should not be used as a sole basis for treatment. Nasal washings and aspirates are unacceptable for Xpert Xpress SARS-CoV-2/FLU/RSV testing.  Fact Sheet for Patients: BloggerCourse.com  Fact Sheet for Healthcare Providers: SeriousBroker.it  This test is not yet approved or cleared by the [place] and  has been authorized for detection and/or diagnosis of SARS-CoV-2 by FDA under an Emergency Use Authorization (EUA). This EUA will remain in effect (meaning this test can be used) for the duration of the COVID-19 declaration under Section 564(b)(1) of the Act, 21 U.S.C. section 360bbb-3(b)(1), unless the authorization is terminated or revoked.  Performed at [facility], [address] [place], [place] [address]   Urine Culture     Status: Abnormal   Collection Time: 03/31/21  5:51 AM   Specimen: Urine, Clean Catch  Result Value Ref Range Status   Specimen Description URINE, CLEAN CATCH  Final   Special Requests NONE  Final   Culture (A)  Final    >=100,000 COLONIES/mL METHICILLIN RESISTANT STAPHYLOCOCCUS AUREUS 40,000 COLONIES/mL AEROCOCCUS SPECIES Standardized susceptibility testing for this organism is not available. Performed at [facility], [address] [place], [place] [address]    Report Status 04/02/2021 FINAL  Final   Organism ID, Bacteria METHICILLIN RESISTANT STAPHYLOCOCCUS AUREUS (A)  Final       Susceptibility   Methicillin resistant staphylococcus aureus - MIC*    CIPROFLOXACIN >=8 RESISTANT Resistant     GENTAMICIN <=0.5 SENSITIVE Sensitive     NITROFURANTOIN <=16 SENSITIVE Sensitive     OXACILLIN >=4 RESISTANT Resistant     TETRACYCLINE <=1 SENSITIVE Sensitive     VANCOMYCIN 1 SENSITIVE Sensitive     TRIMETH/SULFA <=10 SENSITIVE Sensitive     CLINDAMYCIN <=0.25 SENSITIVE Sensitive     RIFAMPIN <=0.5 SENSITIVE Sensitive     Inducible Clindamycin NEGATIVE Sensitive     * >=100,000 COLONIES/mL METHICILLIN RESISTANT STAPHYLOCOCCUS AUREUS  Culture, blood (Routine X 2) w Reflex to ID Panel     Status: Abnormal   Collection Time: 04/01/21 11:08 AM   Specimen: BLOOD  Result Value Ref Range Status   Specimen Description BLOOD LEFT ANTECUBITAL  Final   Special Requests   Final    BOTTLES DRAWN AEROBIC AND ANAEROBIC Blood Culture adequate volume   Culture  Setup Time   Final    GRAM POSITIVE COCCI IN CLUSTERS IN BOTH AEROBIC AND ANAEROBIC BOTTLES Organism ID to follow CRITICAL RESULT CALLED TO, READ BACK BY AND VERIFIED WITH: PARMD MIRANDA BRYK BY MESSAN H. AT 0552 02 7 27 2022 Performed at [facility], [address] [place], [place] [address]    Culture METHICILLIN RESISTANT STAPHYLOCOCCUS AUREUS (A)  Final   Report Status 04/04/2021 FINAL  Final   Organism ID, Bacteria METHICILLIN RESISTANT STAPHYLOCOCCUS AUREUS  Final      Susceptibility   Methicillin resistant staphylococcus aureus - MIC*    CIPROFLOXACIN >=8 RESISTANT Resistant     ERYTHROMYCIN >=8 RESISTANT Resistant     GENTAMICIN <=0.5 SENSITIVE Sensitive     OXACILLIN >=4 RESISTANT Resistant     TETRACYCLINE <=1 SENSITIVE Sensitive     VANCOMYCIN 1 SENSITIVE Sensitive     TRIMETH/SULFA <=10 SENSITIVE Sensitive     CLINDAMYCIN <=0.25 SENSITIVE Sensitive     RIFAMPIN <=0.5 SENSITIVE Sensitive     Inducible Clindamycin NEGATIVE Sensitive     * METHICILLIN RESISTANT STAPHYLOCOCCUS AUREUS  Blood Culture  ID Panel (Reflexed)     Status: Abnormal   Collection Time: 04/01/21 11:08 AM  Result Value Ref Range Status   Enterococcus faecalis NOT DETECTED NOT DETECTED Final   Enterococcus Faecium NOT DETECTED NOT DETECTED Final   Listeria monocytogenes NOT DETECTED NOT DETECTED Final   Staphylococcus species DETECTED (A) NOT DETECTED Final  Comment: CRITICAL RESULT CALLED TO, READ BACK BY AND VERIFIED WITH: PARMD MIRANDA BRYK BY MESSAN H. AT 0552 02 7 27 2022    Staphylococcus aureus (BCID) DETECTED (A) NOT DETECTED Final    Comment: Methicillin (oxacillin)-resistant Staphylococcus aureus (MRSA). MRSA is predictably resistant to beta-lactam antibiotics (except ceftaroline). Preferred therapy is vancomycin unless clinically contraindicated. Patient requires contact precautions if  hospitalized. CRITICAL RESULT CALLED TO, READ BACK BY AND VERIFIED WITH: PARMD MIRANDA BRYK BY MESSAN H. AT 0552 02 7 27 2022    Staphylococcus epidermidis NOT DETECTED NOT DETECTED Final   Staphylococcus lugdunensis NOT DETECTED NOT DETECTED Final   Streptococcus species NOT DETECTED NOT DETECTED Final   Streptococcus agalactiae NOT DETECTED NOT DETECTED Final   Streptococcus pneumoniae NOT DETECTED NOT DETECTED Final   Streptococcus pyogenes NOT DETECTED NOT DETECTED Final   A.calcoaceticus-baumannii NOT DETECTED NOT DETECTED Final   Bacteroides fragilis NOT DETECTED NOT DETECTED Final   Enterobacterales NOT DETECTED NOT DETECTED Final   Enterobacter cloacae complex NOT DETECTED NOT DETECTED Final   Escherichia coli NOT DETECTED NOT DETECTED Final   Klebsiella aerogenes NOT DETECTED NOT DETECTED Final   Klebsiella oxytoca NOT DETECTED NOT DETECTED Final   Klebsiella pneumoniae NOT DETECTED NOT DETECTED Final   Proteus species NOT DETECTED NOT DETECTED Final   Salmonella species NOT DETECTED NOT DETECTED Final   Serratia marcescens NOT DETECTED NOT DETECTED Final   Haemophilus influenzae NOT DETECTED NOT DETECTED  Final   Neisseria meningitidis NOT DETECTED NOT DETECTED Final   Pseudomonas aeruginosa NOT DETECTED NOT DETECTED Final   Stenotrophomonas maltophilia NOT DETECTED NOT DETECTED Final   Candida albicans NOT DETECTED NOT DETECTED Final   Candida auris NOT DETECTED NOT DETECTED Final   Candida glabrata NOT DETECTED NOT DETECTED Final   Candida krusei NOT DETECTED NOT DETECTED Final   Candida parapsilosis NOT DETECTED NOT DETECTED Final   Candida tropicalis NOT DETECTED NOT DETECTED Final   Cryptococcus neoformans/gattii NOT DETECTED NOT DETECTED Final   Meth resistant mecA/C and MREJ DETECTED (A) NOT DETECTED Final    Comment: CRITICAL RESULT CALLED TO, READ BACK BY AND VERIFIED WITH: PARMD MIRANDA BRYK BY MESSAN H. AT 0552 02 7 27 2022 Performed at [facility], [address], [place], [place] [address]   Culture, blood (Routine X 2) w Reflex to ID Panel     Status: Abnormal   Collection Time: 04/01/21 11:18 AM   Specimen: BLOOD RIGHT HAND  Result Value Ref Range Status   Specimen Description BLOOD RIGHT HAND  Final   Special Requests   Final    BOTTLES DRAWN AEROBIC ONLY Blood Culture adequate volume   Culture  Setup Time   Final    GRAM POSITIVE COCCI IN CLUSTERS AEROBIC BOTTLE ONLY CRITICAL VALUE NOTED.  VALUE IS CONSISTENT WITH PREVIOUSLY REPORTED AND CALLED VALUE.    Culture (A)  Final    STAPHYLOCOCCUS AUREUS SUSCEPTIBILITIES PERFORMED ON PREVIOUS CULTURE WITHIN THE LAST 5 DAYS. Performed at [facility], [address] [place], [place] [address]    Report Status 04/04/2021 FINAL  Final  Culture, blood (Routine X 2) w Reflex to ID Panel     Status: None (Preliminary result)   Collection Time: 04/02/21 11:50 PM   Specimen: BLOOD RIGHT HAND  Result Value Ref Range Status   Specimen Description BLOOD RIGHT HAND  Final   Special Requests   Final    BOTTLES DRAWN AEROBIC ONLY Blood Culture adequate volume  Culture   Final    NO GROWTH < 12 HOURS Performed at  [facility], [address], [place], [place] [address]    Report Status PENDING  Incomplete  Culture, blood (Routine X 2) w Reflex to ID Panel     Status: None (Preliminary result)   Collection Time: 04/03/21 12:09 AM   Specimen: BLOOD LEFT HAND  Result Value Ref Range Status   Specimen Description BLOOD LEFT HAND  Final   Special Requests   Final    BOTTLES DRAWN AEROBIC ONLY Blood Culture adequate volume   Culture   Final    NO GROWTH < 12 HOURS Performed at [facility], [address], [place], [place] [address]    Report Status PENDING  Incomplete      Radiology Studies: MR BRAIN W WO CONTRAST  Result Date: 04/03/2021 CLINICAL DATA:  Initial evaluation for acute headache, papilledema, history of MRSA bacteremia. EXAM: MRI HEAD AND ORBITS WITHOUT AND WITH CONTRAST TECHNIQUE: Multiplanar, multiecho pulse sequences of the brain and surrounding structures were obtained without and with intravenous contrast. Multiplanar, multiecho pulse sequences of the orbits and surrounding structures were obtained including fat saturation techniques, before and after intravenous contrast administration. CONTRAST:  8.33mL GADAVIST GADOBUTROL 1 MMOL/ML IV SOLN COMPARISON:  Prior CT from 05/02/2011. FINDINGS: MRI HEAD FINDINGS Brain: Cerebral volume within normal limits for age. Scattered patchy T2/FLAIR hyperintensity seen involving the periventricular, deep, and subcortical white matter both cerebral hemispheres as well as the pons, most likely related chronic microvascular ischemic disease, moderate in nature. 7 mm acute ischemic infarcts seen involving the left caudate head (series 2, image 30). Few additional scattered subcentimeter ischemic infarcts noted involving the cortical and subcortical left parietal lobe (series 2, images 34, 33) as well as the left splenium (series 2, image 27). Suspected additional subtle acute to subacute ischemic infarct noted involving the right temporal lobe  (series 2, image 19). Suspected subtle associated enhancement about the right temporal infarct (series 17, image 11). No other associated enhancement. No mass effect or associated hemorrhage. Otherwise, gray-white matter differentiation maintained. No encephalomalacia to suggest chronic cortical infarction elsewhere within the brain. No other foci of susceptibility artifact to suggest acute or chronic intracranial hemorrhage. No mass lesion, midline shift or mass effect. No hydrocephalus or extra-axial fluid collection. No intraventricular effusion or debris. Pituitary gland suprasellar region within normal limits. Midline structures intact and normal. On corresponding orbit portion of this exam, there is question of diffuse left a meningeal enhancement involving both cerebral hemispheres (series 14, image 1). Finding not entirely certain, and could be technical in nature. No other abnormal enhancement. Vascular: Major intracranial vascular flow voids are well maintained. Skull and upper cervical spine: Craniocervical junction within normal limits. Bone marrow signal intensity normal. No scalp soft tissue abnormality. Other: Trace left mastoid effusion, of doubtful significance. Inner ear structures grossly normal. MRI ORBITS FINDINGS Orbits: Globes are symmetric in size with normal appearance and morphology bilaterally. Apparent asymmetric FLAIR signal abnormality involving the right globe felt to be artifactual on this exam, with no signal abnormality seen on corresponding sequences. Optic nerves within normal limits without intrinsic edema or abnormal enhancement. No abnormality about the optic nerve sheaths. Intraconal and extraconal fat well-maintained. Extra-ocular muscles symmetric and within normal limits. Lacrimal glands normal. No abnormality about the orbital apices. Cavernous sinus within normal limits. Superior orbital veins symmetric and unremarkable. Optic chiasm normally situated within the  suprasellar cistern without abnormality. No evidence for  acute infection about either orbit. Question diffuse leptomeningeal enhancement involving the visualized cerebral hemispheres as above. Visualized sinuses: Scattered mucosal thickening noted about the ethmoidal air cells. Visualized paranasal sinuses are otherwise clear. Soft tissues: Unremarkable. IMPRESSION: 1. 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 No associated hemorrhage or mass effect. 2. Question diffuse leptomeningeal enhancement involving both cerebral hemispheres as above. Finding not entirely certain, and could be technical in nature on this exam. However, possible meningitis should be considered in the setting of MRSA bacteremia and scattered ischemic infarcts. Correlation with LP and CSF studies recommended for further evaluation. 3. No other acute intracranial abnormality. No other evidence for acute CNS or intraorbital infection. 4. Moderate chronic microvascular ischemic disease. Electronically Signed   By: Rise Mu M.D.   On: 04/03/2021 22:10   ECHO TEE  Result Date: 04/04/2021    TRANSESOPHOGEAL ECHO REPORT   Patient Name:   Katie Serrano Date of Exam: 04/04/2021 Medical Rec #:  [id number]       Height:       66.0 in Accession #:    [id number]      Weight:       185.0 lb Date of Birth:  July 09, 1956       BSA:          1.934 m Patient Age:    64 years        BP:           154/87 mmHg Patient Gender: F               HR:           71 bpm. Exam Location:  Inpatient Procedure: 3D Echo, Cardiac Doppler, Color Doppler, Saline Contrast Bubble Study            and Transesophageal Echo Indications:     Bacteremia R78.81  History:         Patient has prior history of Echocardiogram examinations, most                  recent 04/02/2021. CAD, COPD, Signs/Symptoms:Fever and Fatigue;                  Risk  Factors:Hypertension, Diabetes, Dyslipidemia and Current                  Smoker.  Sonographer:     Elmarie Shiley Dance Referring Phys:  [id number] Loura Halt AMIN Diagnosing Phys: Lennie Odor MD PROCEDURE: After discussion of the risks and benefits of a TEE, an informed consent was obtained from the patient. TEE procedure time was 10 minutes. The transesophogeal probe was passed without difficulty through the esophogus of the patient. Imaged were obtained with the patient in a left lateral decubitus position. Local oropharyngeal anesthetic was provided with Cetacaine. Sedation performed by different physician. The patient was monitored while under deep sedation. Anesthestetic sedation was provided intravenously by Anesthesiology:  of Propofol,  of Lidocaine. Image quality was excellent. The patient's vital signs; including heart rate, blood pressure, and oxygen saturation; remained stable throughout the procedure. The patient developed no complications during the procedure. IMPRESSIONS  1. Left ventricular ejection fraction, by estimation, is 55 to 60%. The left ventricle has normal function.  2. Right ventricular systolic function is normal. The right ventricular size is normal.  3. No left atrial/left atrial appendage thrombus was detected.  4. The mitral valve is  grossly normal. Mild mitral valve regurgitation. No evidence of mitral stenosis.  5. The aortic valve is tricuspid. Aortic valve regurgitation is not visualized. No aortic stenosis is present.  6. There is mild (Grade II) layered plaque involving the descending aorta.  7. Agitated saline contrast bubble study was negative, with no evidence of any interatrial shunt. Conclusion(s)/Recommendation(s): No evidence of vegetation/infective endocarditis on this transesophageal echocardiogram. FINDINGS  Left Ventricle: Left ventricular ejection fraction, by estimation, is 55 to 60%. The left ventricle has normal function. The left ventricular internal cavity  size was normal in size. Right Ventricle: The right ventricular size is normal. No increase in right ventricular wall thickness. Right ventricular systolic function is normal. Left Atrium: Left atrial size was normal in size. No left atrial/left atrial appendage thrombus was detected. Right Atrium: Right atrial size was normal in size. Pericardium: Trivial pericardial effusion is present. Mitral Valve: The mitral valve is grossly normal. Mild mitral valve regurgitation. No evidence of mitral valve stenosis. There is no evidence of mitral valve vegetation. Tricuspid Valve: The tricuspid valve is normal in structure. Tricuspid valve regurgitation is trivial. No evidence of tricuspid stenosis. There is no evidence of tricuspid valve vegetation. Aortic Valve: The aortic valve is tricuspid. Aortic valve regurgitation is not visualized. No aortic stenosis is present. There is no evidence of aortic valve vegetation. Pulmonic Valve: The pulmonic valve was normal in structure. Pulmonic valve regurgitation is trivial. No evidence of pulmonic stenosis. There is no evidence of pulmonic valve vegetation. Aorta: The aortic root and ascending aorta are structurally normal, with no evidence of dilitation. There is mild (Grade II) layered plaque involving the descending aorta. Venous: The left upper pulmonary vein, left lower pulmonary vein, right upper pulmonary vein and right lower pulmonary vein are normal. IAS/Shunts: No atrial level shunt detected by color flow Doppler. Agitated saline contrast was given intravenously to evaluate for intracardiac shunting. Agitated saline contrast bubble study was negative, with no evidence of any interatrial shunt.   AORTA Ao Root diam: 2.86 cm Ao Asc diam:  3.27 cm TRICUSPID VALVE TR Peak grad:   16.5 mmHg TR Vmax:        203.00 cm/s Lennie Odor MD Electronically signed by Lennie Odor MD Signature Date/Time: 04/04/2021/1:31:34 PM    Final    CT ANGIO HEAD CODE STROKE  Result Date:  04/05/2021 CLINICAL DATA:  Follow-up examination for acute stroke. EXAM: CT ANGIOGRAPHY HEAD AND NECK TECHNIQUE: Multidetector CT imaging of the head and neck was performed using the standard protocol during bolus administration of intravenous contrast. Multiplanar CT image reconstructions and MIPs were obtained to evaluate the vascular anatomy. Carotid stenosis measurements (when applicable) are obtained utilizing NASCET criteria, using the distal internal carotid diameter as the denominator. CONTRAST:  75mL OMNIPAQUE IOHEXOL 350 MG/ML SOLN COMPARISON:  MRI from 04/03/2021. FINDINGS: CT HEAD FINDINGS Brain: Previously identified scattered subcentimeter acute ischemic infarcts not well visualized by CT. No other acute large vessel territory infarct. No intracranial hemorrhage. No mass lesion, mass effect, or midline shift. No hydrocephalus or extra-axial fluid collection. Vascular: No hyperdense vessel. Skull: Scalp soft tissues and calvarium within normal limits. Sinuses: Paranasal sinuses and mastoid air cells are clear. Orbits: Globes and orbital soft tissues demonstrate no acute finding. Review of the MIP images confirms the above findings CTA NECK FINDINGS Aortic arch: Visualized aortic arch normal caliber with normal branch pattern. Mild atheromatous change about the visualized arch. No hemodynamically significant stenosis seen about the origin the great vessels. Right carotid  system: Right common and internal carotid arteries widely patent without stenosis, dissection or occlusion. Left carotid system: Left common and internal carotid arteries patent without stenosis, dissection or occlusion. Vertebral arteries: Left vertebral artery arises directly from the aortic arch. Vertebral arteries widely patent within the neck without stenosis, dissection or occlusion. Skeleton: Prior ACDF at C5-C7. No discrete or worrisome osseous lesions. Patient is edentulous. Other neck: No other acute soft tissue abnormality  within the neck. No mass or adenopathy. Upper chest: Visualized upper chest demonstrates no acute finding. Review of the MIP images confirms the above findings CTA HEAD FINDINGS Anterior circulation: Petrous segments patent bilaterally. Mild atheromatous change within the carotid siphons without significant stenosis. A1 segments widely patent. Normal anterior communicating artery complex. Anterior cerebral arteries patent to their distal aspects without stenosis. No M1 stenosis or occlusion. Normal MCA bifurcations. Distal MCA branches well perfused and symmetric. Posterior circulation: Both V4 segments patent to the vertebrobasilar junction without stenosis. Both PICA origins patent and normal. Basilar widely patent to its distal aspect without stenosis. Superior cerebellar arteries patent bilaterally. Both PCAs primarily supplied via the basilar and are well perfused to there distal aspects. Venous sinuses: Grossly patent allowing for timing the contrast bolus. Anatomic variants: None significant.  No aneurysm. Review of the MIP images confirms the above findings IMPRESSION: CT HEAD IMPRESSION: 1. Previously identified scattered subcentimeter ischemic infarcts not well visualized by CT. 2. No other new acute intracranial abnormality. CTA HEAD AND NECK IMPRESSION: Negative CTA of the head and neck. No large vessel occlusion. No hemodynamically significant or correctable stenosis. Electronically Signed   By: Rise Mu M.D.   On: 04/05/2021 00:20   CT ANGIO NECK CODE STROKE  Result Date: 04/05/2021 CLINICAL DATA:  Follow-up examination for acute stroke. EXAM: CT ANGIOGRAPHY HEAD AND NECK TECHNIQUE: Multidetector CT imaging of the head and neck was performed using the standard protocol during bolus administration of intravenous contrast. Multiplanar CT image reconstructions and MIPs were obtained to evaluate the vascular anatomy. Carotid stenosis measurements (when applicable) are obtained utilizing  NASCET criteria, using the distal internal carotid diameter as the denominator. CONTRAST:  44mL OMNIPAQUE IOHEXOL 350 MG/ML SOLN COMPARISON:  MRI from 04/03/2021. FINDINGS: CT HEAD FINDINGS Brain: Previously identified scattered subcentimeter acute ischemic infarcts not well visualized by CT. No other acute large vessel territory infarct. No intracranial hemorrhage. No mass lesion, mass effect, or midline shift. No hydrocephalus or extra-axial fluid collection. Vascular: No hyperdense vessel. Skull: Scalp soft tissues and calvarium within normal limits. Sinuses: Paranasal sinuses and mastoid air cells are clear. Orbits: Globes and orbital soft tissues demonstrate no acute finding. Review of the MIP images confirms the above findings CTA NECK FINDINGS Aortic arch: Visualized aortic arch normal caliber with normal branch pattern. Mild atheromatous change about the visualized arch. No hemodynamically significant stenosis seen about the origin the great vessels. Right carotid system: Right common and internal carotid arteries widely patent without stenosis, dissection or occlusion. Left carotid system: Left common and internal carotid arteries patent without stenosis, dissection or occlusion. Vertebral arteries: Left vertebral artery arises directly from the aortic arch. Vertebral arteries widely patent within the neck without stenosis, dissection or occlusion. Skeleton: Prior ACDF at C5-C7. No discrete or worrisome osseous lesions. Patient is edentulous. Other neck: No other acute soft tissue abnormality within the neck. No mass or adenopathy. Upper chest: Visualized upper chest demonstrates no acute finding. Review of the MIP images confirms the above findings CTA HEAD FINDINGS Anterior circulation: Petrous segments patent  bilaterally. Mild atheromatous change within the carotid siphons without significant stenosis. A1 segments widely patent. Normal anterior communicating artery complex. Anterior cerebral arteries  patent to their distal aspects without stenosis. No M1 stenosis or occlusion. Normal MCA bifurcations. Distal MCA branches well perfused and symmetric. Posterior circulation: Both V4 segments patent to the vertebrobasilar junction without stenosis. Both PICA origins patent and normal. Basilar widely patent to its distal aspect without stenosis. Superior cerebellar arteries patent bilaterally. Both PCAs primarily supplied via the basilar and are well perfused to there distal aspects. Venous sinuses: Grossly patent allowing for timing the contrast bolus. Anatomic variants: None significant.  No aneurysm. Review of the MIP images confirms the above findings IMPRESSION: CT HEAD IMPRESSION: 1. Previously identified scattered subcentimeter ischemic infarcts not well visualized by CT. 2. No other new acute intracranial abnormality. CTA HEAD AND NECK IMPRESSION: Negative CTA of the head and neck. No large vessel occlusion. No hemodynamically significant or correctable stenosis. Electronically Signed   By: Rise MuBenjamin  McClintock M.D.   On: 04/05/2021 00:20   MR ORBITS W WO CONTRAST  Result Date: 04/03/2021 CLINICAL DATA:  Initial evaluation for acute headache, papilledema, history of MRSA bacteremia. EXAM: MRI HEAD AND ORBITS WITHOUT AND WITH CONTRAST TECHNIQUE: Multiplanar, multiecho pulse sequences of the brain and surrounding structures were obtained without and with intravenous contrast. Multiplanar, multiecho pulse sequences of the orbits and surrounding structures were obtained including fat saturation techniques, before and after intravenous contrast administration. CONTRAST:  8.595mL GADAVIST GADOBUTROL 1 MMOL/ML IV SOLN COMPARISON:  Prior CT from 05/02/2011. FINDINGS: MRI HEAD FINDINGS Brain: Cerebral volume within normal limits for age. Scattered patchy T2/FLAIR hyperintensity seen involving the periventricular, deep, and subcortical white matter both cerebral hemispheres as well as the pons, most likely related  chronic microvascular ischemic disease, moderate in nature. 7 mm acute ischemic infarcts seen involving the left caudate head (series 2, image 30). Few additional scattered subcentimeter ischemic infarcts noted involving the cortical and subcortical left parietal lobe (series 2, images 34, 33) as well as the left splenium (series 2, image 27). Suspected additional subtle acute to subacute ischemic infarct noted involving the right temporal lobe (series 2, image 19). Suspected subtle associated enhancement about the right temporal infarct (series 17, image 11). No other associated enhancement. No mass effect or associated hemorrhage. Otherwise, gray-white matter differentiation maintained. No encephalomalacia to suggest chronic cortical infarction elsewhere within the brain. No other foci of susceptibility artifact to suggest acute or chronic intracranial hemorrhage. No mass lesion, midline shift or mass effect. No hydrocephalus or extra-axial fluid collection. No intraventricular effusion or debris. Pituitary gland suprasellar region within normal limits. Midline structures intact and normal. On corresponding orbit portion of this exam, there is question of diffuse left a meningeal enhancement involving both cerebral hemispheres (series 14, image 1). Finding not entirely certain, and could be technical in nature. No other abnormal enhancement. Vascular: Major intracranial vascular flow voids are well maintained. Skull and upper cervical spine: Craniocervical junction within normal limits. Bone marrow signal intensity normal. No scalp soft tissue abnormality. Other: Trace left mastoid effusion, of doubtful significance. Inner ear structures grossly normal. MRI ORBITS FINDINGS Orbits: Globes are symmetric in size with normal appearance and morphology bilaterally. Apparent asymmetric FLAIR signal abnormality involving the right globe felt to be artifactual on this exam, with no signal abnormality seen on corresponding  sequences. Optic nerves within normal limits without intrinsic edema or abnormal enhancement. No abnormality about the optic nerve sheaths. Intraconal and extraconal fat well-maintained. Extra-ocular  muscles symmetric and within normal limits. Lacrimal glands normal. No abnormality about the orbital apices. Cavernous sinus within normal limits. Superior orbital veins symmetric and unremarkable. Optic chiasm normally situated within the suprasellar cistern without abnormality. No evidence for acute infection about either orbit. Question diffuse leptomeningeal enhancement involving the visualized cerebral hemispheres as above. Visualized sinuses: Scattered mucosal thickening noted about the ethmoidal air cells. Visualized paranasal sinuses are otherwise clear. Soft tissues: Unremarkable. IMPRESSION: 1. 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 No associated hemorrhage or mass effect. 2. Question diffuse leptomeningeal enhancement involving both cerebral hemispheres as above. Finding not entirely certain, and could be technical in nature on this exam. However, possible meningitis should be considered in the setting of MRSA bacteremia and scattered ischemic infarcts. Correlation with LP and CSF studies recommended for further evaluation. 3. No other acute intracranial abnormality. No other evidence for acute CNS or intraorbital infection. 4. Moderate chronic microvascular ischemic disease. Electronically Signed   By: Rise Mu M.D.   On: 04/03/2021 22:10    Scheduled Meds:  amLODipine  5 mg Oral Daily   aspirin  81 mg Oral Daily   enoxaparin (LOVENOX) injection  40 mg Subcutaneous Q24H   insulin aspart  0-15 Units Subcutaneous TID WC   insulin aspart  0-5 Units Subcutaneous QHS   insulin glargine-yfgn  14 Units Subcutaneous Daily   losartan  50 mg Oral QHS   metoprolol  tartrate  25 mg Oral BID   multivitamin with minerals  1 tablet Oral Daily   omega-3 acid ethyl esters  1 g Oral Daily   Continuous Infusions:  vancomycin 1,500 mg (04/04/21 1001)     LOS: 4 days   Time spent: 35 minutes.  Tyrone Nine, MD [facility] [email or web address] 04/05/2021, 9:05 AM

## 2021-04-05 NOTE — Progress Notes (Signed)
Pt returned from pacu via bed by pacu nurse. Pt GCS 15, pt grimacing and frowning rates 9/10 anterior and posterior knee right knee pain. CNS intact swelling noted right knee Pt husband at bedside. This rn repositioned pts RLE  and will notify md of pt pain level.

## 2021-04-05 NOTE — Progress Notes (Addendum)
Stroke Team Brief Note:  Chart reviewed. Stroke team consulted for acute infarcts in setting of MRSA bacteremia. We attempted to see patient today in follow up, but pt has been off the floor to MRI and then to OR per nursing staff. We will check back again tomorrow. Meanwhile, please call for questions.   Desiree Metzger-Cihelka, ARNP-C, ANVP-BC Pager: [phone]   ATTENDING ATTESTATION:  Agree with above note.   Miraya Cudney,MD

## 2021-04-05 NOTE — Consult Note (Signed)
ORTHOPAEDIC CONSULTATION  REQUESTING PHYSICIAN: Tyrone Nine, MD  PCP:  Gordan Payment., MD  Chief Complaint: R knee pain  HPI: Katie Serrano is a 65 y.o. female who was admitted to [facility] for fever and malaise.  She was found to have a MRSA UTI with bacteremia.  Infectious disease has been following.  Over the past 24 hours, she developed pain and swelling in the right knee.  Orthopedic consultation was placed for evaluation of her knee.  She does not have any other complaints at this time.  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 Relevant Hx: Course: Daily Update: Today's Plan:discussed with her that her sugars will increase and she has actively worked on her diet and diet and lost from 242 to 188 which I commend her on, she is to continue with her weight loss efforts despite taking this   Electronically signed by: Sandi Mealy   Past Surgical History:  Procedure Laterality Date   APPENDECTOMY     BUNIONECTOMY Right    LEFT HEART CATH AND CORONARY ANGIOGRAPHY N/A 03/12/2021   Procedure: LEFT HEART CATH AND CORONARY ANGIOGRAPHY;  Surgeon: Lennette Bihari, MD;  Location: [facility] INVASIVE CV LAB;  Service: Cardiovascular;  Laterality:  N/A;   NECK SURGERY     SMALL INTESTINE SURGERY     TONSILLECTOMY     Social History   Socioeconomic History   Marital status: Married    Spouse name: Not on file   Number of children: Not on file   Years of education: Not on file   Highest education level: Not on file  Occupational History   Not on file  Tobacco Use   Smoking status: Every Day    Packs/day: 0.50    Types: Cigarettes   Smokeless tobacco: Never  Vaping Use   Vaping Use: Never used  Substance and Sexual Activity   Alcohol use: Yes    Alcohol/week: 1.0 standard drink    Types: 1 Glasses of wine per week    Comment: Socially   Drug use: Never   Sexual activity: Not on file  Other Topics Concern   Not on file  Social History Narrative   Not on file   Social Determinants of Health   Financial Resource Strain: Not on file  Food Insecurity: Not on file  Transportation Needs: Not on file  Physical Activity: Not on file  Stress: Not on file  Social Connections: Not on file   Family History  Problem Relation Age of Onset   Diabetes Mother    Heart attack Mother 49   Angina Father    Heart attack Father 45   Diabetes Paternal Grandmother    No Known Allergies Prior to Admission medications   Medication Sig  Start Date End Date Taking? Authorizing Provider  albuterol (VENTOLIN HFA) 108 (90 Base) MCG/ACT inhaler Inhale 2 puffs into the lungs every 6 (six) hours as needed for wheezing or shortness of breath. 12/02/17  Yes [provider]  aspirin 81 MG chewable tablet Chew 81 mg by mouth daily.   Yes [provider]  Cinnamon 500 MG capsule Take 500 mg by mouth daily.   Yes [provider]  Chilton Si Tea 150 MG CAPS Take 150 mg by mouth daily.   Yes [provider]  ibuprofen (ADVIL) 800 MG tablet Take 800 mg by mouth every 6 (six) hours as needed for pain. 01/13/21  Yes [provider]  losartan (COZAAR) 50 MG tablet Take 50 mg by mouth at bedtime. 05/01/19  Yes  [provider]  metFORMIN (GLUCOPHAGE-XR) 500 MG 24 hr tablet Take 500 mg by mouth 2 (two) times daily. 03/14/19  Yes [provider]  metoprolol tartrate (LOPRESSOR) 25 MG tablet Take 1 tablet (25 mg total) by mouth 2 (two) times daily. 03/06/21 06/04/21 Yes Baldo Daub, MD  Multiple Vitamin (MULTIVITAMIN) capsule Take 1 capsule by mouth daily.   Yes [provider]  nitroGLYCERIN (NITROSTAT) 0.4 MG SL tablet Place 1 tablet (0.4 mg total) under the tongue every 5 (five) minutes as needed. 03/06/21 06/04/21 Yes Baldo Daub, MD  Omega-3 1000 MG CAPS Take 1,000 mg by mouth daily.   Yes [provider]  amLODipine (NORVASC) 5 MG tablet Take 1 tablet (5 mg total) by mouth daily. Patient not taking: No sig reported 03/12/21   Laverda Page B, NP  atorvastatin (LIPITOR) 80 MG tablet Take 1 tablet (80 mg total) by mouth daily. Patient not taking: No sig reported 03/12/21   Laverda Page B, NP  pravastatin (PRAVACHOL) 20 MG tablet Take 1 tablet (20 mg total) by mouth every evening. 02/19/21 03/12/21  Baldo Daub, MD   MR BRAIN W WO CONTRAST  Result Date: 04/03/2021 CLINICAL DATA:  Initial evaluation for acute headache, papilledema, history of MRSA bacteremia. EXAM: MRI HEAD AND ORBITS WITHOUT AND WITH CONTRAST TECHNIQUE: Multiplanar, multiecho pulse sequences of the brain and surrounding structures were obtained without and with intravenous contrast. Multiplanar, multiecho pulse sequences of the orbits and surrounding structures were obtained including fat saturation techniques, before and after intravenous contrast administration. CONTRAST:  8.45mL GADAVIST GADOBUTROL 1 MMOL/ML IV SOLN COMPARISON:  Prior CT from 05/02/2011. FINDINGS: MRI HEAD FINDINGS Brain: Cerebral volume within normal limits for age. Scattered patchy T2/FLAIR hyperintensity seen involving the periventricular, deep, and subcortical white matter both cerebral hemispheres as well as the pons, most  likely related chronic microvascular ischemic disease, moderate in nature. 7 mm acute ischemic infarcts seen involving the left caudate head (series 2, image 30). Few additional scattered subcentimeter ischemic infarcts noted involving the cortical and subcortical left parietal lobe (series 2, images 34, 33) as well as the left splenium (series 2, image 27). Suspected additional subtle acute to subacute ischemic infarct noted involving the right temporal lobe (series 2, image 19). Suspected subtle associated enhancement about the right temporal infarct (series 17, image 11). No other associated enhancement. No mass effect or associated hemorrhage. Otherwise, gray-white matter differentiation maintained. No encephalomalacia to suggest chronic cortical infarction elsewhere within the brain. No other foci of susceptibility artifact to suggest acute or chronic intracranial hemorrhage. No mass lesion, midline shift or mass effect. No hydrocephalus or extra-axial fluid collection. No intraventricular effusion or debris. Pituitary gland suprasellar region within  normal limits. Midline structures intact and normal. On corresponding orbit portion of this exam, there is question of diffuse left a meningeal enhancement involving both cerebral hemispheres (series 14, image 1). Finding not entirely certain, and could be technical in nature. No other abnormal enhancement. Vascular: Major intracranial vascular flow voids are well maintained. Skull and upper cervical spine: Craniocervical junction within normal limits. Bone marrow signal intensity normal. No scalp soft tissue abnormality. Other: Trace left mastoid effusion, of doubtful significance. Inner ear structures grossly normal. MRI ORBITS FINDINGS Orbits: Globes are symmetric in size with normal appearance and morphology bilaterally. Apparent asymmetric FLAIR signal abnormality involving the right globe felt to be artifactual on this exam, with no signal abnormality seen on  corresponding sequences. Optic nerves within normal limits without intrinsic edema or abnormal enhancement. No abnormality about the optic nerve sheaths. Intraconal and extraconal fat well-maintained. Extra-ocular muscles symmetric and within normal limits. Lacrimal glands normal. No abnormality about the orbital apices. Cavernous sinus within normal limits. Superior orbital veins symmetric and unremarkable. Optic chiasm normally situated within the suprasellar cistern without abnormality. No evidence for acute infection about either orbit. Question diffuse leptomeningeal enhancement involving the visualized cerebral hemispheres as above. Visualized sinuses: Scattered mucosal thickening noted about the ethmoidal air cells. Visualized paranasal sinuses are otherwise clear. Soft tissues: Unremarkable. IMPRESSION: 1. 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 No associated hemorrhage or mass effect. 2. Question diffuse leptomeningeal enhancement involving both cerebral hemispheres as above. Finding not entirely certain, and could be technical in nature on this exam. However, possible meningitis should be considered in the setting of MRSA bacteremia and scattered ischemic infarcts. Correlation with LP and CSF studies recommended for further evaluation. 3. No other acute intracranial abnormality. No other evidence for acute CNS or intraorbital infection. 4. Moderate chronic microvascular ischemic disease. Electronically Signed   By: Rise Mu M.D.   On: 04/03/2021 22:10   ECHO TEE  Result Date: 04/04/2021    TRANSESOPHOGEAL ECHO REPORT   Patient Name:   ARIANNE KLINGE Date of Exam: 04/04/2021 Medical Rec #:  [id number]       Height:       66.0 in Accession #:    [id number]      Weight:       185.0 lb Date of Birth:  [birth year]       BSA:          1.934 m Patient Age:    64 years         BP:           154/87 mmHg Patient Gender: F               HR:           71 bpm. Exam Location:  Inpatient Procedure: 3D Echo, Cardiac Doppler, Color Doppler, Saline Contrast Bubble Study            and Transesophageal Echo Indications:     Bacteremia R78.81  History:         Patient has prior history of Echocardiogram examinations, most                  recent 04/02/2021. CAD, COPD, Signs/Symptoms:Fever and Fatigue;                  Risk Factors:Hypertension, Diabetes, Dyslipidemia and Current  Smoker.  Sonographer:     Elmarie Shiley Dance Referring Phys:  [id number] Loura Halt AMIN Diagnosing Phys: Lennie Odor MD PROCEDURE: After discussion of the risks and benefits of a TEE, an informed consent was obtained from the patient. TEE procedure time was 10 minutes. The transesophogeal probe was passed without difficulty through the esophogus of the patient. Imaged were obtained with the patient in a left lateral decubitus position. Local oropharyngeal anesthetic was provided with Cetacaine. Sedation performed by different physician. The patient was monitored while under deep sedation. Anesthestetic sedation was provided intravenously by Anesthesiology: 96mg  of Propofol, 100mg  of Lidocaine. Image quality was excellent. The patient's vital signs; including heart rate, blood pressure, and oxygen saturation; remained stable throughout the procedure. The patient developed no complications during the procedure. IMPRESSIONS  1. Left ventricular ejection fraction, by estimation, is 55 to 60%. The left ventricle has normal function.  2. Right ventricular systolic function is normal. The right ventricular size is normal.  3. No left atrial/left atrial appendage thrombus was detected.  4. The mitral valve is grossly normal. Mild mitral valve regurgitation. No evidence of mitral stenosis.  5. The aortic valve is tricuspid. Aortic valve regurgitation is not visualized. No aortic stenosis is present.  6. There is mild  (Grade II) layered plaque involving the descending aorta.  7. Agitated saline contrast bubble study was negative, with no evidence of any interatrial shunt. Conclusion(s)/Recommendation(s): No evidence of vegetation/infective endocarditis on this transesophageal echocardiogram. FINDINGS  Left Ventricle: Left ventricular ejection fraction, by estimation, is 55 to 60%. The left ventricle has normal function. The left ventricular internal cavity size was normal in size. Right Ventricle: The right ventricular size is normal. No increase in right ventricular wall thickness. Right ventricular systolic function is normal. Left Atrium: Left atrial size was normal in size. No left atrial/left atrial appendage thrombus was detected. Right Atrium: Right atrial size was normal in size. Pericardium: Trivial pericardial effusion is present. Mitral Valve: The mitral valve is grossly normal. Mild mitral valve regurgitation. No evidence of mitral valve stenosis. There is no evidence of mitral valve vegetation. Tricuspid Valve: The tricuspid valve is normal in structure. Tricuspid valve regurgitation is trivial. No evidence of tricuspid stenosis. There is no evidence of tricuspid valve vegetation. Aortic Valve: The aortic valve is tricuspid. Aortic valve regurgitation is not visualized. No aortic stenosis is present. There is no evidence of aortic valve vegetation. Pulmonic Valve: The pulmonic valve was normal in structure. Pulmonic valve regurgitation is trivial. No evidence of pulmonic stenosis. There is no evidence of pulmonic valve vegetation. Aorta: The aortic root and ascending aorta are structurally normal, with no evidence of dilitation. There is mild (Grade II) layered plaque involving the descending aorta. Venous: The left upper pulmonary vein, left lower pulmonary vein, right upper pulmonary vein and right lower pulmonary vein are normal. IAS/Shunts: No atrial level shunt detected by color flow Doppler. Agitated saline  contrast was given intravenously to evaluate for intracardiac shunting. Agitated saline contrast bubble study was negative, with no evidence of any interatrial shunt.   AORTA Ao Root diam: 2.86 cm Ao Asc diam:  3.27 cm TRICUSPID VALVE TR Peak grad:   16.5 mmHg TR Vmax:        203.00 cm/s MD Electronically signed by MD Signature Date/Time: 04/04/2021/1:31:34 PM    Final    CT ANGIO HEAD CODE STROKE  Result Date: 04/05/2021 CLINICAL DATA:  Follow-up examination for acute stroke. EXAM: CT ANGIOGRAPHY HEAD AND NECK  TECHNIQUE: Multidetector CT imaging of the head and neck was performed using the standard protocol during bolus administration of intravenous contrast. Multiplanar CT image reconstructions and MIPs were obtained to evaluate the vascular anatomy. Carotid stenosis measurements (when applicable) are obtained utilizing NASCET criteria, using the distal internal carotid diameter as the denominator. CONTRAST:  75mL OMNIPAQUE IOHEXOL 350 MG/ML SOLN COMPARISON:  MRI from 04/03/2021. FINDINGS: CT HEAD FINDINGS Brain: Previously identified scattered subcentimeter acute ischemic infarcts not well visualized by CT. No other acute large vessel territory infarct. No intracranial hemorrhage. No mass lesion, mass effect, or midline shift. No hydrocephalus or extra-axial fluid collection. Vascular: No hyperdense vessel. Skull: Scalp soft tissues and calvarium within normal limits. Sinuses: Paranasal sinuses and mastoid air cells are clear. Orbits: Globes and orbital soft tissues demonstrate no acute finding. Review of the MIP images confirms the above findings CTA NECK FINDINGS Aortic arch: Visualized aortic arch normal caliber with normal branch pattern. Mild atheromatous change about the visualized arch. No hemodynamically significant stenosis seen about the origin the great vessels. Right carotid system: Right common and internal carotid arteries widely patent without stenosis, dissection or  occlusion. Left carotid system: Left common and internal carotid arteries patent without stenosis, dissection or occlusion. Vertebral arteries: Left vertebral artery arises directly from the aortic arch. Vertebral arteries widely patent within the neck without stenosis, dissection or occlusion. Skeleton: Prior ACDF at C5-C7. No discrete or worrisome osseous lesions. Patient is edentulous. Other neck: No other acute soft tissue abnormality within the neck. No mass or adenopathy. Upper chest: Visualized upper chest demonstrates no acute finding. Review of the MIP images confirms the above findings CTA HEAD FINDINGS Anterior circulation: Petrous segments patent bilaterally. Mild atheromatous change within the carotid siphons without significant stenosis. A1 segments widely patent. Normal anterior communicating artery complex. Anterior cerebral arteries patent to their distal aspects without stenosis. No M1 stenosis or occlusion. Normal MCA bifurcations. Distal MCA branches well perfused and symmetric. Posterior circulation: Both V4 segments patent to the vertebrobasilar junction without stenosis. Both PICA origins patent and normal. Basilar widely patent to its distal aspect without stenosis. Superior cerebellar arteries patent bilaterally. Both PCAs primarily supplied via the basilar and are well perfused to there distal aspects. Venous sinuses: Grossly patent allowing for timing the contrast bolus. Anatomic variants: None significant.  No aneurysm. Review of the MIP images confirms the above findings IMPRESSION: CT HEAD IMPRESSION: 1. Previously identified scattered subcentimeter ischemic infarcts not well visualized by CT. 2. No other new acute intracranial abnormality. CTA HEAD AND NECK IMPRESSION: Negative CTA of the head and neck. No large vessel occlusion. No hemodynamically significant or correctable stenosis. Electronically Signed   By: Rise Mu M.D.   On: 04/05/2021 00:20   CT ANGIO NECK CODE  STROKE  Result Date: 04/05/2021 CLINICAL DATA:  Follow-up examination for acute stroke. EXAM: CT ANGIOGRAPHY HEAD AND NECK TECHNIQUE: Multidetector CT imaging of the head and neck was performed using the standard protocol during bolus administration of intravenous contrast. Multiplanar CT image reconstructions and MIPs were obtained to evaluate the vascular anatomy. Carotid stenosis measurements (when applicable) are obtained utilizing NASCET criteria, using the distal internal carotid diameter as the denominator. CONTRAST:  75mL OMNIPAQUE IOHEXOL 350 MG/ML SOLN COMPARISON:  MRI from 04/03/2021. FINDINGS: CT HEAD FINDINGS Brain: Previously identified scattered subcentimeter acute ischemic infarcts not well visualized by CT. No other acute large vessel territory infarct. No intracranial hemorrhage. No mass lesion, mass effect, or midline shift. No hydrocephalus or extra-axial fluid collection.  Vascular: No hyperdense vessel. Skull: Scalp soft tissues and calvarium within normal limits. Sinuses: Paranasal sinuses and mastoid air cells are clear. Orbits: Globes and orbital soft tissues demonstrate no acute finding. Review of the MIP images confirms the above findings CTA NECK FINDINGS Aortic arch: Visualized aortic arch normal caliber with normal branch pattern. Mild atheromatous change about the visualized arch. No hemodynamically significant stenosis seen about the origin the great vessels. Right carotid system: Right common and internal carotid arteries widely patent without stenosis, dissection or occlusion. Left carotid system: Left common and internal carotid arteries patent without stenosis, dissection or occlusion. Vertebral arteries: Left vertebral artery arises directly from the aortic arch. Vertebral arteries widely patent within the neck without stenosis, dissection or occlusion. Skeleton: Prior ACDF at C5-C7. No discrete or worrisome osseous lesions. Patient is edentulous. Other neck: No other acute soft  tissue abnormality within the neck. No mass or adenopathy. Upper chest: Visualized upper chest demonstrates no acute finding. Review of the MIP images confirms the above findings CTA HEAD FINDINGS Anterior circulation: Petrous segments patent bilaterally. Mild atheromatous change within the carotid siphons without significant stenosis. A1 segments widely patent. Normal anterior communicating artery complex. Anterior cerebral arteries patent to their distal aspects without stenosis. No M1 stenosis or occlusion. Normal MCA bifurcations. Distal MCA branches well perfused and symmetric. Posterior circulation: Both V4 segments patent to the vertebrobasilar junction without stenosis. Both PICA origins patent and normal. Basilar widely patent to its distal aspect without stenosis. Superior cerebellar arteries patent bilaterally. Both PCAs primarily supplied via the basilar and are well perfused to there distal aspects. Venous sinuses: Grossly patent allowing for timing the contrast bolus. Anatomic variants: None significant.  No aneurysm. Review of the MIP images confirms the above findings IMPRESSION: CT HEAD IMPRESSION: 1. Previously identified scattered subcentimeter ischemic infarcts not well visualized by CT. 2. No other new acute intracranial abnormality. CTA HEAD AND NECK IMPRESSION: Negative CTA of the head and neck. No large vessel occlusion. No hemodynamically significant or correctable stenosis. Electronically Signed   By: Rise Mu M.D.   On: 04/05/2021 00:20   MR ORBITS W WO CONTRAST  Result Date: 04/03/2021 CLINICAL DATA:  Initial evaluation for acute headache, papilledema, history of MRSA bacteremia. EXAM: MRI HEAD AND ORBITS WITHOUT AND WITH CONTRAST TECHNIQUE: Multiplanar, multiecho pulse sequences of the brain and surrounding structures were obtained without and with intravenous contrast. Multiplanar, multiecho pulse sequences of the orbits and surrounding structures were obtained including  fat saturation techniques, before and after intravenous contrast administration. CONTRAST:  8.78mL GADAVIST GADOBUTROL 1 MMOL/ML IV SOLN COMPARISON:  Prior CT from 05/02/2011. FINDINGS: MRI HEAD FINDINGS Brain: Cerebral volume within normal limits for age. Scattered patchy T2/FLAIR hyperintensity seen involving the periventricular, deep, and subcortical white matter both cerebral hemispheres as well as the pons, most likely related chronic microvascular ischemic disease, moderate in nature. 7 mm acute ischemic infarcts seen involving the left caudate head (series 2, image 30). Few additional scattered subcentimeter ischemic infarcts noted involving the cortical and subcortical left parietal lobe (series 2, images 34, 33) as well as the left splenium (series 2, image 27). Suspected additional subtle acute to subacute ischemic infarct noted involving the right temporal lobe (series 2, image 19). Suspected subtle associated enhancement about the right temporal infarct (series 17, image 11). No other associated enhancement. No mass effect or associated hemorrhage. Otherwise, gray-white matter differentiation maintained. No encephalomalacia to suggest chronic cortical infarction elsewhere within the brain. No other foci of susceptibility artifact  to suggest acute or chronic intracranial hemorrhage. No mass lesion, midline shift or mass effect. No hydrocephalus or extra-axial fluid collection. No intraventricular effusion or debris. Pituitary gland suprasellar region within normal limits. Midline structures intact and normal. On corresponding orbit portion of this exam, there is question of diffuse left a meningeal enhancement involving both cerebral hemispheres (series 14, image 1). Finding not entirely certain, and could be technical in nature. No other abnormal enhancement. Vascular: Major intracranial vascular flow voids are well maintained. Skull and upper cervical spine: Craniocervical junction within normal limits.  Bone marrow signal intensity normal. No scalp soft tissue abnormality. Other: Trace left mastoid effusion, of doubtful significance. Inner ear structures grossly normal. MRI ORBITS FINDINGS Orbits: Globes are symmetric in size with normal appearance and morphology bilaterally. Apparent asymmetric FLAIR signal abnormality involving the right globe felt to be artifactual on this exam, with no signal abnormality seen on corresponding sequences. Optic nerves within normal limits without intrinsic edema or abnormal enhancement. No abnormality about the optic nerve sheaths. Intraconal and extraconal fat well-maintained. Extra-ocular muscles symmetric and within normal limits. Lacrimal glands normal. No abnormality about the orbital apices. Cavernous sinus within normal limits. Superior orbital veins symmetric and unremarkable. Optic chiasm normally situated within the suprasellar cistern without abnormality. No evidence for acute infection about either orbit. Question diffuse leptomeningeal enhancement involving the visualized cerebral hemispheres as above. Visualized sinuses: Scattered mucosal thickening noted about the ethmoidal air cells. Visualized paranasal sinuses are otherwise clear. Soft tissues: Unremarkable. IMPRESSION: 1. 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 No associated hemorrhage or mass effect. 2. Question diffuse leptomeningeal enhancement involving both cerebral hemispheres as above. Finding not entirely certain, and could be technical in nature on this exam. However, possible meningitis should be considered in the setting of MRSA bacteremia and scattered ischemic infarcts. Correlation with LP and CSF studies recommended for further evaluation. 3. No other acute intracranial abnormality. No other evidence for acute CNS or intraorbital infection. 4. Moderate chronic  microvascular ischemic disease. Electronically Signed   By: Rise Mu M.D.   On: 04/03/2021 22:10    Positive ROS: All other systems have been reviewed and were otherwise negative with the exception of those mentioned in the HPI and as above.  Physical Exam: General: Alert, no acute distress Cardiovascular: No pedal edema Respiratory: No cyanosis, no use of accessory musculature GI: No organomegaly, abdomen is soft and non-tender Skin: No lesions in the area of chief complaint Neurologic: Sensation intact distally Psychiatric: Patient is competent for consent with normal mood and affect Lymphatic: No axillary or cervical lymphadenopathy  MUSCULOSKELETAL: Examination of the right knee reveals no skin wounds or lesions.  She has a large effusion.  She has global diffuse tenderness to palpation.  She has limited range of motion secondary to pain.  She is neurovascularly intact.  Assessment: Right knee effusion, like native septic arthritis. MRSA bacteremia.  Plan: I discussed the findings with the patient and her husband.  In light of her clinical situation, there is great concern for septic arthritis of the right knee.  I recommend diagnostic aspiration of the right knee.  Written consent was obtained.  The right knee was sterilely prepped with ChloraPrep solution.  5 cc of 1% plain lidocaine was infiltrated into the skin and subcutaneous tissues.  Using sterile technique, an 18-gauge needle was introduced into the suprapatellar pouch of the right knee.  I  aspirated 25 cc of straw-colored cloudy joint fluid.  Sterile dressing was applied.  She tolerated procedure well.  The synovial fluid specimen was sent to the lab for stat cell count with crystal ID, gram stain, and culture.  Given the current situation with the patient and the appearance of the joint fluid, we will get her set up for open arthrotomy and drainage of the right knee.  All questions were solicited and  answered.    Jonette PesaBrian J Charice Zuno, MD [phone]    04/05/2021 11:30 AM

## 2021-04-05 NOTE — Progress Notes (Signed)
Pt right knee pain decreased to 5/10

## 2021-04-05 NOTE — Anesthesia Preprocedure Evaluation (Signed)
Anesthesia Evaluation  Patient identified by MRN, date of birth, ID band Patient awake    Reviewed: Allergy & Precautions, NPO status   Airway Mallampati: II  TM Distance: >3 FB Neck ROM: Full    Dental  (+) Edentulous Upper, Edentulous Lower   Pulmonary shortness of breath, asthma , Current Smoker and Patient abstained from smoking.,  Covid-19 Nucleic Acid Test Results Lab Results      Component                Value               Date                      SARSCOV2NAA              NEGATIVE            03/31/2021              breath sounds clear to auscultation       Cardiovascular hypertension, + angina + CAD   Rhythm:Regular   ? Prox RCA lesion is 100% stenosed. ? Prox LAD lesion is 45% stenosed. ? 2nd Diag lesion is 50% stenosed. ? Mid LAD lesion is 50% stenosed. ? Dist LAD lesion is 50% stenosed. ? Prox Cx lesion is 60% stenosed. ? 2nd Mrg-1 lesion is 50% stenosed. ? 2nd Mrg-2 lesion is 60% stenosed. ? Mid Cx to Dist Cx lesion is 20% stenosed. ? Dist RCA lesion is 50% stenosed. ? The left ventricular ejection fraction is greater than 65% by visual estimate. ? The left ventricular systolic function is normal. ? LV end diastolic pressure is mildly elevated. ? Dist Cx lesion is 30% stenosed.   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  Dynamic LV function with EF estimated least 65 to 70%.  There were no focal segmental wall motion abnormalities.  LVEDP 19 mmHg.     Neuro/Psych CVA    GI/Hepatic Neg liver ROS, GERD  ,  Endo/Other  diabetes, Type 2   Renal/GU Renal diseaseLab Results      Component                Value               Date                      CREATININE               0.82                04/05/2021                Musculoskeletal  (+) Arthritis ,   Abdominal   Peds  Hematology negative hematology ROS (+) Lab Results      Component                Value               Date  WBC                      19.7 (H)            04/05/2021                HGB                      12.1                04/05/2021                HCT                      36.4                04/05/2021                MCV                      86.1                04/05/2021                PLT                      447 (H)             04/05/2021              Anesthesia Other Findings 1. Left ventricular ejection fraction, by estimation, is 55 to 60%. The  left ventricle has normal function.  2. Right ventricular systolic function is normal. The right ventricular  size is normal.  3. No left atrial/left atrial appendage thrombus was detected.  4. The mitral valve is grossly normal. Mild mitral valve regurgitation.  No evidence of mitral stenosis.  5. The aortic valve is tricuspid. Aortic valve regurgitation is not  visualized. No aortic stenosis is present.  6. There is mild (Grade II) layered plaque involving the descending  aorta.  7. Agitated saline contrast bubble study was negative, with no evidence  of any interatrial shunt.   Reproductive/Obstetrics                             Anesthesia Physical Anesthesia Plan  ASA: 3  Anesthesia Plan: General   Post-op Pain Management:    Induction: Intravenous  PONV Risk Score and Plan: 2 and Ondansetron and Treatment may vary due to age or medical condition  Airway Management Planned: LMA  Additional Equipment: None  Intra-op Plan:   Post-operative Plan: Extubation in OR  Informed Consent: I have reviewed the patients History and  Physical, chart, labs and discussed the procedure including the risks, benefits and alternatives for the proposed anesthesia with the patient or authorized representative who has indicated his/her understanding and acceptance.     Dental advisory given  Plan Discussed with: CRNA and Surgeon  Anesthesia Plan Comments:         Anesthesia Quick Evaluation

## 2021-04-05 NOTE — Progress Notes (Signed)
Pt given 1 mg ivp for right knee pain

## 2021-04-05 NOTE — Progress Notes (Addendum)
STROKE TEAM PROGRESS NOTE   INTERVAL HISTORY Her husband is at the bedside. She c/o right knee pain, but overall feeling better with improved mental status. No apparent clinical symptoms of her tiny strokes.  Vitals:   04/04/21 1846 04/04/21 2342 04/05/21 0610 04/05/21 0633  BP: (!) 154/74 (!) 149/75 (!) 184/83 (!) 180/67  Pulse: 81 75 82   Resp: 16 19    Temp: 98.7 F (37.1 C) 99.5 F (37.5 C) 99.4 F (37.4 C)   TempSrc: Oral Oral Oral   SpO2: 94% 92%  93%  Weight:      Height:       CBC:  Recent Labs  Lab 03/31/21 0442 04/01/21 0504 04/04/21 0359 04/05/21 0322  WBC 21.5*   < > 18.0* 19.7*  NEUTROABS 17.6*  --   --   --   HGB 14.0   < > 12.8 12.1  HCT 42.1   < > 37.0 36.4  MCV 87.9   < > 84.1 86.1  PLT 214   < > 401* 447*   < > = values in this interval not displayed.   Basic Metabolic Panel:  Recent Labs  Lab 04/04/21 0359 04/05/21 0322  NA 135 136  K 3.4* 3.3*  CL 101 98  CO2 26 29  GLUCOSE 221* 218*  BUN 11 10  CREATININE 0.81 0.82  CALCIUM 8.7* 8.6*  MG 2.0 2.0   Lipid Panel: 44 HgbA1c:  Recent Labs  Lab 03/31/21 1354  HGBA1C 9.8*   Urine Drug Screen: No results for input(s): LABOPIA, COCAINSCRNUR, LABBENZ, AMPHETMU, THCU, LABBARB in the last 168 hours.  Alcohol Level No results for input(s): ETH in the last 168 hours.  IMAGING past 24 hours ECHO TEE  Result Date: 04/04/2021    TRANSESOPHOGEAL ECHO REPORT   Patient Name:   Katie Serrano Date of Exam: 04/04/2021 Medical Rec #:  [id number]       Height:       66.0 in Accession #:    [id number]      Weight:       185.0 lb Date of Birth:  [birth year]       BSA:          1.934 m Patient Age:    2 years        BP:           154/87 mmHg Patient Gender: F               HR:           71 bpm. Exam Location:  Inpatient Procedure: 3D Echo, Cardiac Doppler, Color Doppler, Saline Contrast Bubble Study            and Transesophageal Echo Indications:     Bacteremia R78.81  History:         Patient has prior history  of Echocardiogram examinations, most                  recent 04/02/2021. CAD, COPD, Signs/Symptoms:Fever and Fatigue;                  Risk Factors:Hypertension, Diabetes, Dyslipidemia and Current                  Smoker.  Sonographer:     Jonelle Sidle Dance Referring Phys:  [id number] Jeanella Flattery AMIN Diagnosing Phys: Eleonore Chiquito MD PROCEDURE: After discussion of the risks and benefits of a TEE, an informed consent was obtained from the patient.  TEE procedure time was 10 minutes. The transesophogeal probe was passed without difficulty through the esophogus of the patient. Imaged were obtained with the patient in a left lateral decubitus position. Local oropharyngeal anesthetic was provided with Cetacaine. Sedation performed by different physician. The patient was monitored while under deep sedation. Anesthestetic sedation was provided intravenously by Anesthesiology: 24m of Propofol, 1044mof Lidocaine. Image quality was excellent. The patient's vital signs; including heart rate, blood pressure, and oxygen saturation; remained stable throughout the procedure. The patient developed no complications during the procedure. IMPRESSIONS  1. Left ventricular ejection fraction, by estimation, is 55 to 60%. The left ventricle has normal function.  2. Right ventricular systolic function is normal. The right ventricular size is normal.  3. No left atrial/left atrial appendage thrombus was detected.  4. The mitral valve is grossly normal. Mild mitral valve regurgitation. No evidence of mitral stenosis.  5. The aortic valve is tricuspid. Aortic valve regurgitation is not visualized. No aortic stenosis is present.  6. There is mild (Grade II) layered plaque involving the descending aorta.  7. Agitated saline contrast bubble study was negative, with no evidence of any interatrial shunt. Conclusion(s)/Recommendation(s): No evidence of vegetation/infective endocarditis on this transesophageal echocardiogram. FINDINGS  Left Ventricle:  Left ventricular ejection fraction, by estimation, is 55 to 60%. The left ventricle has normal function. The left ventricular internal cavity size was normal in size. Right Ventricle: The right ventricular size is normal. No increase in right ventricular wall thickness. Right ventricular systolic function is normal. Left Atrium: Left atrial size was normal in size. No left atrial/left atrial appendage thrombus was detected. Right Atrium: Right atrial size was normal in size. Pericardium: Trivial pericardial effusion is present. Mitral Valve: The mitral valve is grossly normal. Mild mitral valve regurgitation. No evidence of mitral valve stenosis. There is no evidence of mitral valve vegetation. Tricuspid Valve: The tricuspid valve is normal in structure. Tricuspid valve regurgitation is trivial. No evidence of tricuspid stenosis. There is no evidence of tricuspid valve vegetation. Aortic Valve: The aortic valve is tricuspid. Aortic valve regurgitation is not visualized. No aortic stenosis is present. There is no evidence of aortic valve vegetation. Pulmonic Valve: The pulmonic valve was normal in structure. Pulmonic valve regurgitation is trivial. No evidence of pulmonic stenosis. There is no evidence of pulmonic valve vegetation. Aorta: The aortic root and ascending aorta are structurally normal, with no evidence of dilitation. There is mild (Grade II) layered plaque involving the descending aorta. Venous: The left upper pulmonary vein, left lower pulmonary vein, right upper pulmonary vein and right lower pulmonary vein are normal. IAS/Shunts: No atrial level shunt detected by color flow Doppler. Agitated saline contrast was given intravenously to evaluate for intracardiac shunting. Agitated saline contrast bubble study was negative, with no evidence of any interatrial shunt.   AORTA Ao Root diam: 2.86 cm Ao Asc diam:  3.27 cm TRICUSPID VALVE TR Peak grad:   16.5 mmHg TR Vmax:        203.00 cm/s WeEleonore ChiquitoD  Electronically signed by WeEleonore ChiquitoD Signature Date/Time: 04/04/2021/1:31:34 PM    Final    CT ANGIO HEAD CODE STROKE  Result Date: 04/05/2021 CLINICAL DATA:  Follow-up examination for acute stroke. EXAM: CT ANGIOGRAPHY HEAD AND NECK TECHNIQUE: Multidetector CT imaging of the head and neck was performed using the standard protocol during bolus administration of intravenous contrast. Multiplanar CT image reconstructions and MIPs were obtained to evaluate the vascular anatomy. Carotid stenosis measurements (when applicable) are  obtained utilizing NASCET criteria, using the distal internal carotid diameter as the denominator. CONTRAST:  67m OMNIPAQUE IOHEXOL 350 MG/ML SOLN COMPARISON:  MRI from 04/03/2021. FINDINGS: CT HEAD FINDINGS Brain: Previously identified scattered subcentimeter acute ischemic infarcts not well visualized by CT. No other acute large vessel territory infarct. No intracranial hemorrhage. No mass lesion, mass effect, or midline shift. No hydrocephalus or extra-axial fluid collection. Vascular: No hyperdense vessel. Skull: Scalp soft tissues and calvarium within normal limits. Sinuses: Paranasal sinuses and mastoid air cells are clear. Orbits: Globes and orbital soft tissues demonstrate no acute finding. Review of the MIP images confirms the above findings CTA NECK FINDINGS Aortic arch: Visualized aortic arch normal caliber with normal branch pattern. Mild atheromatous change about the visualized arch. No hemodynamically significant stenosis seen about the origin the great vessels. Right carotid system: Right common and internal carotid arteries widely patent without stenosis, dissection or occlusion. Left carotid system: Left common and internal carotid arteries patent without stenosis, dissection or occlusion. Vertebral arteries: Left vertebral artery arises directly from the aortic arch. Vertebral arteries widely patent within the neck without stenosis, dissection or occlusion. Skeleton:  Prior ACDF at C5-C7. No discrete or worrisome osseous lesions. Patient is edentulous. Other neck: No other acute soft tissue abnormality within the neck. No mass or adenopathy. Upper chest: Visualized upper chest demonstrates no acute finding. Review of the MIP images confirms the above findings CTA HEAD FINDINGS Anterior circulation: Petrous segments patent bilaterally. Mild atheromatous change within the carotid siphons without significant stenosis. A1 segments widely patent. Normal anterior communicating artery complex. Anterior cerebral arteries patent to their distal aspects without stenosis. No M1 stenosis or occlusion. Normal MCA bifurcations. Distal MCA branches well perfused and symmetric. Posterior circulation: Both V4 segments patent to the vertebrobasilar junction without stenosis. Both PICA origins patent and normal. Basilar widely patent to its distal aspect without stenosis. Superior cerebellar arteries patent bilaterally. Both PCAs primarily supplied via the basilar and are well perfused to there distal aspects. Venous sinuses: Grossly patent allowing for timing the contrast bolus. Anatomic variants: None significant.  No aneurysm. Review of the MIP images confirms the above findings IMPRESSION: CT HEAD IMPRESSION: 1. Previously identified scattered subcentimeter ischemic infarcts not well visualized by CT. 2. No other new acute intracranial abnormality. CTA HEAD AND NECK IMPRESSION: Negative CTA of the head and neck. No large vessel occlusion. No hemodynamically significant or correctable stenosis. Electronically Signed   By: BJeannine BogaM.D.   On: 04/05/2021 00:20   CT ANGIO NECK CODE STROKE  Result Date: 04/05/2021 CLINICAL DATA:  Follow-up examination for acute stroke. EXAM: CT ANGIOGRAPHY HEAD AND NECK TECHNIQUE: Multidetector CT imaging of the head and neck was performed using the standard protocol during bolus administration of intravenous contrast. Multiplanar CT image  reconstructions and MIPs were obtained to evaluate the vascular anatomy. Carotid stenosis measurements (when applicable) are obtained utilizing NASCET criteria, using the distal internal carotid diameter as the denominator. CONTRAST:  742mOMNIPAQUE IOHEXOL 350 MG/ML SOLN COMPARISON:  MRI from 04/03/2021. FINDINGS: CT HEAD FINDINGS Brain: Previously identified scattered subcentimeter acute ischemic infarcts not well visualized by CT. No other acute large vessel territory infarct. No intracranial hemorrhage. No mass lesion, mass effect, or midline shift. No hydrocephalus or extra-axial fluid collection. Vascular: No hyperdense vessel. Skull: Scalp soft tissues and calvarium within normal limits. Sinuses: Paranasal sinuses and mastoid air cells are clear. Orbits: Globes and orbital soft tissues demonstrate no acute finding. Review of the MIP images confirms the above  findings CTA NECK FINDINGS Aortic arch: Visualized aortic arch normal caliber with normal branch pattern. Mild atheromatous change about the visualized arch. No hemodynamically significant stenosis seen about the origin the great vessels. Right carotid system: Right common and internal carotid arteries widely patent without stenosis, dissection or occlusion. Left carotid system: Left common and internal carotid arteries patent without stenosis, dissection or occlusion. Vertebral arteries: Left vertebral artery arises directly from the aortic arch. Vertebral arteries widely patent within the neck without stenosis, dissection or occlusion. Skeleton: Prior ACDF at C5-C7. No discrete or worrisome osseous lesions. Patient is edentulous. Other neck: No other acute soft tissue abnormality within the neck. No mass or adenopathy. Upper chest: Visualized upper chest demonstrates no acute finding. Review of the MIP images confirms the above findings CTA HEAD FINDINGS Anterior circulation: Petrous segments patent bilaterally. Mild atheromatous change within the  carotid siphons without significant stenosis. A1 segments widely patent. Normal anterior communicating artery complex. Anterior cerebral arteries patent to their distal aspects without stenosis. No M1 stenosis or occlusion. Normal MCA bifurcations. Distal MCA branches well perfused and symmetric. Posterior circulation: Both V4 segments patent to the vertebrobasilar junction without stenosis. Both PICA origins patent and normal. Basilar widely patent to its distal aspect without stenosis. Superior cerebellar arteries patent bilaterally. Both PCAs primarily supplied via the basilar and are well perfused to there distal aspects. Venous sinuses: Grossly patent allowing for timing the contrast bolus. Anatomic variants: None significant.  No aneurysm. Review of the MIP images confirms the above findings IMPRESSION: CT HEAD IMPRESSION: 1. Previously identified scattered subcentimeter ischemic infarcts not well visualized by CT. 2. No other new acute intracranial abnormality. CTA HEAD AND NECK IMPRESSION: Negative CTA of the head and neck. No large vessel occlusion. No hemodynamically significant or correctable stenosis. Electronically Signed   By: Jeannine Boga M.D.   On: 04/05/2021 00:20    PHYSICAL EXAM General: Appears well-developed; mild distress and post op pain. Psych: Affect appropriate to situation Eyes: No scleral injection HENT: No OP obstrucion Head: Normocephalic.  Cardiovascular: Normal rate and regular rhythm.  Respiratory: Effort normal and breath sounds normal to anterior ascultation GI: Soft.  No distension. There is no tenderness.  Skin: WDI    Neurological Examination Mental Status: Alert, oriented, thought content appropriate.  Speech fluent without evidence of aphasia. Able to follow 3 step commands without difficulty. Cranial Nerves: II: Visual fields grossly normal,  III,IV, VI: ptosis not present, extra-ocular motions intact bilaterally, pupils equal, round, reactive to  light and accommodation V,VII: smile symmetric, facial light touch sensation normal bilaterally VIII: hearing normal bilaterally IX,X: uvula rises symmetrically XI: bilateral shoulder shrug XII: midline tongue extension Motor: Limited ROM in RLE d/t post op knee surgery, but able to fully move foot. All other ext are equal w/no focal deficits. Tone and bulk:normal tone throughout; no atrophy noted Sensory: Pinprick and light touch intact throughout, bilaterally Plantars: Right: downgoing   Left: downgoing Cerebellar: normal finger-to-nose, normal rapid alternating movements and normal heel-to-shin test Gait: unable to test d/t knee sx  ASSESSMENT/PLAN Katie Serrano is a 65 y.o. female who was admitted on 03/31/21 for sepsis and found to have MRSA bacteremia/bacturia. Her hospitalization has been complicated by facet septic arthritis and myositis with L5 nerve root compression. Brain MRI revealed scattered cortical and subcortical infarcts and questionable diffuse leptomeningeal enhancement concerning for meningitis. Neurology was consulted for further evaluation.    Stroke: scattered tiny infarcts involving the left caudate head, left parietal lobe, left  splenium, and right temporal lobe. Despite lack of endocarditis seen on TEE, it is possible small septic emboli are cause. Part of secondary stroke prevention is prolonged IV abx as ID has already indicated.  CTA head & neck No LVO. No significant stenosis MRI  scattered infarcts involving the left caudate head, left parietal lobe, left splenium, and right temporal lobe. It also showed leptomeningeal enhancement concerning for possible meningitis. MRA  spine: suspected lumbar pathology with L4-5 facet septic arthritis, several fluiid collections and mass effect on MRI on L5 nerve and myositis near L4-5.Neurosurgery was consulted but did not recommend surgical intervention at this time. Carotid Doppler   2D Echo negative for endocarditis.  60% EF, mild LVH,  TEE : No valvular vegetation was observed. Bubble study was negative. LDL 44 HgbA1c 9.8 VTE prophylaxis - lovenox    Diet   Diet NPO time specified   aspirin 81 mg daily prior to admission, now on ASA 3m daily.  Therapy recommendations:  pending Disposition:  pending  Hypertension Home meds:  Cozaar, Norvasc, lopressor Stable Permissive hypertension (OK if < 220/120) but gradually normalize in 5-7 days Long-term BP goal normotensive  Hyperlipidemia Home meds:  Lipitor attempted in past, but did not tolerate. Omega 3's now; continued here LDL 44, goal < 70 High intensity statin not indicated  Diabetes type II Uncontrolled Home meds:  Glucophage, Insulin HgbA1c 9.8, goal < 7.0 CBGs Recent Labs    04/04/21 1604 04/04/21 2127 04/05/21 0604  GLUCAP 244* 204* 220*    SSI  Other Stroke Risk Factors Advanced Age >/= 664 Cigarette smoker; advised to stop smoking Obesity, Body mass index is 29.85 kg/m., BMI >/= 30 associated with increased stroke risk, recommend weight loss, diet and exercise as appropriate   Other Active Problems Headache over right eye Bilt LE pain and weakness Sepsis, presented with Fever of 105F- Bld Cx showed Staph Aureus While there is concern for meningitis, no LP indicated after d/w ID as it would not change tx plan with current IV abx ongoing.  Septic joint- taken to the OR on 7/30 for open arthrotomy and drainage Rt knee and Hemovac drain placed. Tissue Cx pending.  UTI, MRSA- TM 99.8, Improved WBC since admit Bilateral pyelonephritis  Hospital day # 4 We met with pt and husband and reviewed stroke work up and risk factor modification. She will need out pt neuro f/u in 6 weeks with [facility]. We will s/o. Please call back if needed.  Desiree Metzger-Cihelka, ARNP-C, ANVP-BC Pager: [phone]  ATTENDING ATTESTATION:  Dr. PReeves Forthevaluated pt independently, reviewed imaging, chart, labs. Discussed and formulated plan. Please  see NP note above for details.  Katie Delrosario,MD   To contact Stroke Continuity provider, please refer to [email or web address] After hours, contact General Neurology

## 2021-04-05 NOTE — Progress Notes (Signed)
OT Cancellation Note  Patient Details Name: Katie Serrano MRN: [id number] DOB: 1955/09/20   Cancelled Treatment:    Reason Eval/Treat Not Completed: Patient declined reporting R knee swelling and pain. Per med chart strong suspicion for septic arthritis. OT to check back as time allows after medication administration.   Kallie Edward OTR/L Supplemental OT, Department of rehab services [phone]  Tama Headings 04/05/2021, 9:29 AM

## 2021-04-05 NOTE — Progress Notes (Signed)
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  Rhae Hammock was negative  I have no concern for the rash being monkeypox; she has no epidemiologic exposure and the rash is not suggestive either.  The ddx would be AGEP from drug/mrsa infection. This doesn't look like linear ig-a bullous dermatitis which one could get with vanc, but the AGEP reaction is possible.   We can monitor the rash and decide if need to change antibiotics. No mucosal involvement at this time  Of note, she is rather tender at the right knee; there is some effusion/warmth on exam there. Will discuss with primary team to get imaging and ortho evaluation  7/26 bcx positive 7/27 bcx ngtd  PLAN: Continue vancomycin Monitor rash for worsening Mri right knee Please discuss with ortho about right knee   I spent more than 35 minute reviewing data/chart, and coordinating care and >50% direct face to face time providing counseling/discussing diagnostics/treatment plan with patient   Principal Problem:   MRSA bacteremia Active Problems:   Class 1 obesity due to excess calories with serious comorbidity and body mass index (BMI) of 30.0 to 30.9 in adult   Essential hypertension   Mixed hyperlipidemia   Coronary artery disease of native artery of native heart with stable angina pectoris (HCC)   SIRS (systemic inflammatory response syndrome) (HCC)   Type 2 diabetes mellitus with hyperglycemia (HCC)   Hyponatremia   Acute pyelonephritis   Epidural abscess    Weakness of both lower extremities   Septic embolism (HCC)   Acute bacterial endocarditis   Cerebral embolism with cerebral infarction   No Known Allergies  Scheduled Meds:  amLODipine  10 mg Oral Daily   aspirin  81 mg Oral Daily   enoxaparin (LOVENOX) injection  40 mg Subcutaneous Q24H   insulin aspart  0-15 Units Subcutaneous TID WC   insulin aspart  0-5 Units Subcutaneous QHS   insulin glargine-yfgn  14 Units Subcutaneous Daily   lidocaine (PF)  5 mL Intradermal Once   losartan  50 mg Oral QHS   metoprolol tartrate  25 mg Oral BID   multivitamin with minerals  1 tablet Oral Daily   omega-3 acid ethyl esters  1 g Oral Daily   Continuous Infusions:  vancomycin 1,500 mg (04/04/21 1001)   PRN Meds:.acetaminophen, dextromethorphan-guaiFENesin, gadobutrol, hydrALAZINE, ipratropium-albuterol, nitroGLYCERIN, ondansetron **OR** ondansetron (ZOFRAN) IV, oxyCODONE, senna-docusate   SUBJECTIVE: Feeling better but on "the poo side" still No headache Weak RLE stable Rash acute onset 2 days ago pustules but no itch/pain Husband no issue; no other known contact with rash  No n/v/diarrhea No chest pain/sob  Tee yesterday no endocarditis  Review of Systems: ROS All other ROS was negative, except mentioned above     OBJECTIVE: Vitals:   04/04/21 1846 04/04/21 2342 04/05/21 0610 04/05/21 0633  BP: (!) 154/74 (!) 149/75 (!) 184/83 (!) 180/67  Pulse: 81 75 82   Resp: 16 19    Temp: 98.7 F (37.1 C) 99.5 F (37.5 C) 99.4 F (37.4 C)   TempSrc: Oral Oral Oral  SpO2: 94% 92%  93%  Weight:      Height:       Body mass index is 29.85 kg/m.  Physical Exam General/constitutional: no distress, pleasant HEENT: Normocephalic, PER, Conj Clear, EOMI, Oropharynx clear Neck supple CV: rrr no mrg Lungs: clear to auscultation, normal respiratory effort Abd: Soft, Nontender Ext: no edema Skin: diffuse truncal/ext small 2-3 mm pustular rash; no involvement of mucosa Neuro: right  LE weakness MSK: right knee tender to palpation/rom and warm with small effusion   Central line presence: no   Lab Results Lab Results  Component Value Date   WBC 19.7 (H) 04/05/2021   HGB 12.1 04/05/2021   HCT 36.4 04/05/2021   MCV 86.1 04/05/2021   PLT 447 (H) 04/05/2021    Lab Results  Component Value Date   CREATININE 0.82 04/05/2021   BUN 10 04/05/2021   NA 136 04/05/2021   K 3.3 (L) 04/05/2021   CL 98 04/05/2021   CO2 29 04/05/2021    Lab Results  Component Value Date   ALT 52 (H) 03/31/2021   AST 36 03/31/2021   ALKPHOS 72 03/31/2021   BILITOT 0.8 03/31/2021      Microbiology: Recent Results (from the past 240 hour(s))  Resp Panel by RT-PCR (Flu A&B, Covid) Nasopharyngeal Swab     Status: None   Collection Time: 03/31/21 12:21 AM   Specimen: Nasopharyngeal Swab; Nasopharyngeal(NP) swabs in vial transport medium  Result Value Ref Range Status   SARS Coronavirus 2 by RT PCR NEGATIVE NEGATIVE Final    Comment: (NOTE) SARS-CoV-2 target nucleic acids are NOT DETECTED.  The SARS-CoV-2 RNA is generally detectable in upper respiratory specimens during the acute phase of infection. The lowest concentration of SARS-CoV-2 viral copies this assay can detect is 138 copies/mL. A negative result does not preclude SARS-Cov-2 infection and should not be used as the sole basis for treatment or other patient management decisions. A negative result may occur with  improper specimen collection/handling, submission of specimen other than nasopharyngeal swab, presence of viral mutation(s) within the areas targeted by this assay, and inadequate number of viral copies(<138 copies/mL). A negative result must be combined with clinical observations, patient history, and epidemiological information. The expected result is Negative.  Fact Sheet for Patients:  BloggerCourse.comhttps://www.fda.gov/media/152166/download  Fact Sheet for Healthcare Providers:   SeriousBroker.ithttps://www.fda.gov/media/152162/download  This test is no t yet approved or cleared by the [place] FDA and  has been authorized for detection and/or diagnosis of SARS-CoV-2 by FDA under an Emergency Use Authorization (EUA). This EUA will remain  in effect (meaning this test can be used) for the duration of the COVID-19 declaration under Section 564(b)(1) of the Act, 21 U.S.C.section 360bbb-3(b)(1), unless the authorization is terminated  or revoked sooner.       Influenza A by PCR NEGATIVE NEGATIVE Final   Influenza B by PCR NEGATIVE NEGATIVE Final    Comment: (NOTE) The Xpert Xpress SARS-CoV-2/FLU/RSV plus assay is intended as an aid in the diagnosis of influenza from Nasopharyngeal swab specimens and should not be used as a sole basis for treatment. Nasal washings and aspirates are unacceptable for Xpert Xpress SARS-CoV-2/FLU/RSV testing.  Fact Sheet for Patients: BloggerCourse.comhttps://www.fda.gov/media/152166/download  Fact Sheet for Healthcare Providers: SeriousBroker.ithttps://www.fda.gov/media/152162/download  This test is not yet approved or cleared by the [place] FDA and has been authorized for detection and/or diagnosis of SARS-CoV-2 by FDA under an Emergency Use Authorization (EUA). This EUA will remain in effect (meaning this test can be used) for  the duration of the COVID-19 declaration under Section 564(b)(1) of the Act, 21 U.S.C. section 360bbb-3(b)(1), unless the authorization is terminated or revoked.  Performed at [facility], [address] [place], [place] [address]   Urine Culture     Status: Abnormal   Collection Time: 03/31/21  5:51 AM   Specimen: Urine, Clean Catch  Result Value Ref Range Status   Specimen Description URINE, CLEAN CATCH  Final   Special Requests NONE  Final   Culture (A)  Final    >=100,000 COLONIES/mL METHICILLIN RESISTANT STAPHYLOCOCCUS AUREUS 40,000 COLONIES/mL AEROCOCCUS SPECIES Standardized susceptibility testing for this organism  is not available. Performed at [facility], [address] [place], [place] [address]    Report Status 04/02/2021 FINAL  Final   Organism ID, Bacteria METHICILLIN RESISTANT STAPHYLOCOCCUS AUREUS (A)  Final      Susceptibility   Methicillin resistant staphylococcus aureus - MIC*    CIPROFLOXACIN >=8 RESISTANT Resistant     GENTAMICIN <=0.5 SENSITIVE Sensitive     NITROFURANTOIN <=16 SENSITIVE Sensitive     OXACILLIN >=4 RESISTANT Resistant     TETRACYCLINE <=1 SENSITIVE Sensitive     VANCOMYCIN 1 SENSITIVE Sensitive     TRIMETH/SULFA <=10 SENSITIVE Sensitive     CLINDAMYCIN <=0.25 SENSITIVE Sensitive     RIFAMPIN <=0.5 SENSITIVE Sensitive     Inducible Clindamycin NEGATIVE Sensitive     * >=100,000 COLONIES/mL METHICILLIN RESISTANT STAPHYLOCOCCUS AUREUS  Culture, blood (Routine X 2) w Reflex to ID Panel     Status: Abnormal   Collection Time: 04/01/21 11:08 AM   Specimen: BLOOD  Result Value Ref Range Status   Specimen Description BLOOD LEFT ANTECUBITAL  Final   Special Requests   Final    BOTTLES DRAWN AEROBIC AND ANAEROBIC Blood Culture adequate volume   Culture  Setup Time   Final    GRAM POSITIVE COCCI IN CLUSTERS IN BOTH AEROBIC AND ANAEROBIC BOTTLES Organism ID to follow CRITICAL RESULT CALLED TO, READ BACK BY AND VERIFIED WITH: PARMD MIRANDA BRYK BY MESSAN H. AT 0552 02 7 27 2022 Performed at [facility], [address], [place], [place] [address]    Culture METHICILLIN RESISTANT STAPHYLOCOCCUS AUREUS (A)  Final   Report Status 04/04/2021 FINAL  Final   Organism ID, Bacteria METHICILLIN RESISTANT STAPHYLOCOCCUS AUREUS  Final      Susceptibility   Methicillin resistant staphylococcus aureus - MIC*    CIPROFLOXACIN >=8 RESISTANT Resistant     ERYTHROMYCIN >=8 RESISTANT Resistant     GENTAMICIN <=0.5 SENSITIVE Sensitive     OXACILLIN >=4 RESISTANT Resistant     TETRACYCLINE <=1 SENSITIVE Sensitive     VANCOMYCIN 1 SENSITIVE Sensitive     TRIMETH/SULFA <=10  SENSITIVE Sensitive     CLINDAMYCIN <=0.25 SENSITIVE Sensitive     RIFAMPIN <=0.5 SENSITIVE Sensitive     Inducible Clindamycin NEGATIVE Sensitive     * METHICILLIN RESISTANT STAPHYLOCOCCUS AUREUS  Blood Culture ID Panel (Reflexed)     Status: Abnormal   Collection Time: 04/01/21 11:08 AM  Result Value Ref Range Status   Enterococcus faecalis NOT DETECTED NOT DETECTED Final   Enterococcus Faecium NOT DETECTED NOT DETECTED Final   Listeria monocytogenes NOT DETECTED NOT DETECTED Final   Staphylococcus species DETECTED (A) NOT DETECTED Final    Comment: CRITICAL RESULT CALLED TO, READ BACK BY AND VERIFIED WITH: PARMD MIRANDA BRYK BY MESSAN H. AT 0552 02 7 27 2022    Staphylococcus aureus (BCID)  DETECTED (A) NOT DETECTED Final    Comment: Methicillin (oxacillin)-resistant Staphylococcus aureus (MRSA). MRSA is predictably resistant to beta-lactam antibiotics (except ceftaroline). Preferred therapy is vancomycin unless clinically contraindicated. Patient requires contact precautions if  hospitalized. CRITICAL RESULT CALLED TO, READ BACK BY AND VERIFIED WITH: PARMD MIRANDA BRYK BY MESSAN H. AT 0552 02 7 27 2022    Staphylococcus epidermidis NOT DETECTED NOT DETECTED Final   Staphylococcus lugdunensis NOT DETECTED NOT DETECTED Final   Streptococcus species NOT DETECTED NOT DETECTED Final   Streptococcus agalactiae NOT DETECTED NOT DETECTED Final   Streptococcus pneumoniae NOT DETECTED NOT DETECTED Final   Streptococcus pyogenes NOT DETECTED NOT DETECTED Final   A.calcoaceticus-baumannii NOT DETECTED NOT DETECTED Final   Bacteroides fragilis NOT DETECTED NOT DETECTED Final   Enterobacterales NOT DETECTED NOT DETECTED Final   Enterobacter cloacae complex NOT DETECTED NOT DETECTED Final   Escherichia coli NOT DETECTED NOT DETECTED Final   Klebsiella aerogenes NOT DETECTED NOT DETECTED Final   Klebsiella oxytoca NOT DETECTED NOT DETECTED Final   Klebsiella pneumoniae NOT DETECTED NOT DETECTED  Final   Proteus species NOT DETECTED NOT DETECTED Final   Salmonella species NOT DETECTED NOT DETECTED Final   Serratia marcescens NOT DETECTED NOT DETECTED Final   Haemophilus influenzae NOT DETECTED NOT DETECTED Final   Neisseria meningitidis NOT DETECTED NOT DETECTED Final   Pseudomonas aeruginosa NOT DETECTED NOT DETECTED Final   Stenotrophomonas maltophilia NOT DETECTED NOT DETECTED Final   Candida albicans NOT DETECTED NOT DETECTED Final   Candida auris NOT DETECTED NOT DETECTED Final   Candida glabrata NOT DETECTED NOT DETECTED Final   Candida krusei NOT DETECTED NOT DETECTED Final   Candida parapsilosis NOT DETECTED NOT DETECTED Final   Candida tropicalis NOT DETECTED NOT DETECTED Final   Cryptococcus neoformans/gattii NOT DETECTED NOT DETECTED Final   Meth resistant mecA/C and MREJ DETECTED (A) NOT DETECTED Final    Comment: CRITICAL RESULT CALLED TO, READ BACK BY AND VERIFIED WITH: PARMD MIRANDA BRYK BY MESSAN H. AT 0552 02 7 27 2022 Performed at [facility], [address] [place], [place] [address]   Culture, blood (Routine X 2) w Reflex to ID Panel     Status: Abnormal   Collection Time: 04/01/21 11:18 AM   Specimen: BLOOD RIGHT HAND  Result Value Ref Range Status   Specimen Description BLOOD RIGHT HAND  Final   Special Requests   Final    BOTTLES DRAWN AEROBIC ONLY Blood Culture adequate volume   Culture  Setup Time   Final    GRAM POSITIVE COCCI IN CLUSTERS AEROBIC BOTTLE ONLY CRITICAL VALUE NOTED.  VALUE IS CONSISTENT WITH PREVIOUSLY REPORTED AND CALLED VALUE.    Culture (A)  Final    STAPHYLOCOCCUS AUREUS SUSCEPTIBILITIES PERFORMED ON PREVIOUS CULTURE WITHIN THE LAST 5 DAYS. Performed at [facility], [address], [place], [place] [address]    Report Status 04/04/2021 FINAL  Final  Culture, blood (Routine X 2) w Reflex to ID Panel     Status: None (Preliminary result)   Collection Time: 04/02/21 11:50 PM   Specimen: BLOOD RIGHT HAND  Result  Value Ref Range Status   Specimen Description BLOOD RIGHT HAND  Final   Special Requests   Final    BOTTLES DRAWN AEROBIC ONLY Blood Culture adequate volume   Culture   Final    NO GROWTH < 12 HOURS Performed at [facility], [address], [place], [place] [address]  Report Status PENDING  Incomplete  Culture, blood (Routine X 2) w Reflex to ID Panel     Status: None (Preliminary result)   Collection Time: 04/03/21 12:09 AM   Specimen: BLOOD LEFT HAND  Result Value Ref Range Status   Specimen Description BLOOD LEFT HAND  Final   Special Requests   Final    BOTTLES DRAWN AEROBIC ONLY Blood Culture adequate volume   Culture   Final    NO GROWTH < 12 HOURS Performed at [facility], [address], [place], [place] [address]    Report Status PENDING  Incomplete     Serology:   Imaging: If present, new imagings (plain films, ct scans, and mri) have been personally visualized and interpreted; radiology reports have been reviewed. Decision making incorporated into the Impression / Recommendations.  7/29 tee No endocarditis  7/28 mri orbit with contrast; mri brain 1. 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 No associated hemorrhage or mass effect. 2. Question diffuse leptomeningeal enhancement involving both cerebral hemispheres as above. Finding not entirely certain, and could be technical in nature on this exam. However, possible meningitis should be considered in the setting of MRSA bacteremia and scattered ischemic infarcts. Correlation with LP and CSF studies recommended for further evaluation. 3. No other acute intracranial abnormality. No other evidence for acute CNS or intraorbital infection. 4. Moderate chronic microvascular ischemic disease.  7/28 mri lspine 1. Joint effusion with surrounding enhancement about the left  L4-5 facet, with associated two cystic collections/lesions measuring up to 1.6 cm as above. 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 Possible septic arthritis with associated small collections difficult to exclude, and could be considered in the correct clinical setting. Secondary mass effect on the descending left L5 nerve root by one of these collections with resultant mild left lateral recess stenosis, which could contribute to left lower extremity symptoms. 2. Abnormal edema and enhancement within the left greater than right lower posterior paraspinous musculature, greatest about the left L4-5 facet. Findings suggestive of acute myositis, which could be either infectious or inflammatory in nature. Possible muscular injury/strain could also be considered in the correct clinical setting. 3. Additional small left foraminal to extraforaminal disc protrusions at L3-4 and L4-5, potentially affecting the exiting left L3 and L4 nerve roots respectively. 4. No other evidence for acute infection within the lumbar spine.  Raymondo Band, MD [facility] [phone] pager    04/05/2021, 11:22 AM

## 2021-04-05 NOTE — Anesthesia Procedure Notes (Signed)
Procedure Name: LMA Insertion Date/Time: 04/05/2021 3:30 PM Performed by: Carlos American, CRNA Pre-anesthesia Checklist: Patient identified, Emergency Drugs available, Suction available and Patient being monitored Patient Re-evaluated:Patient Re-evaluated prior to induction Oxygen Delivery Method: Circle System Utilized Preoxygenation: Pre-oxygenation with 100% oxygen Induction Type: IV induction Ventilation: Mask ventilation without difficulty LMA: LMA inserted LMA Size: 4.0 Number of attempts: 1 Placement Confirmation: positive ETCO2 Tube secured with: Tape Dental Injury: Teeth and Oropharynx as per pre-operative assessment

## 2021-04-05 NOTE — Op Note (Signed)
OPERATIVE REPORT   04/05/2021  4:21 PM  PATIENT:  Katie Serrano   SURGEON:  Jonette Pesa, MD  ASSISTANT: Barrie Dunker, PA-C.   PREOPERATIVE DIAGNOSIS: Native septic arthritis right knee.  POSTOPERATIVE DIAGNOSIS:  Same.  PROCEDURE: Open arthrotomy and drainage right knee.  ANESTHESIA:   GETA.  ANTIBIOTICS: Patient is receiving scheduled IV vancomycin.  IMPLANTS: None.  SPECIMENS: Right knee synovium for tissue culture.  TUBES AND DRAINS: Medium Hemovac x1.  COMPLICATIONS: None.  DISPOSITION: Stable to PACU.  SURGICAL INDICATIONS:  Katie Serrano is a 65 y.o. female who was admitted 6 days ago for MRSA bacteremia.  Orthopedic consultation was placed today for increasing right knee pain and swelling.  Right knee aspiration was performed at the bedside, yielding straw-colored cloudy fluid.  Synovial white blood cell count was 2350 with 81% neutrophils.  No crystals were seen.  Gram stain negative.  Given the fact that she has a known MRSA bacteremia with multiple septic emboli, I recommend proceeding with arthrotomy and drainage of the right knee.  The risks, benefits, and alternatives were discussed with the patient preoperatively including but not limited to the risks of infection, bleeding, nerve / blood vessel injury, cardiopulmonary complications, the need for repeat surgery, among others, and the patient was willing to proceed.  PROCEDURE IN DETAIL: The patient was identified in the holding area using 2 identifiers.  The surgical site was marked by myself.  She was taken to the operating room, and she was placed supine on the operating room table.  General anesthesia was induced.  All bony prominences were well-padded.  No tourniquet was utilized.  The right lower extremity was prepped and draped in the normal sterile surgical fashion.  Timeout was called, verifying site and site of surgery.  She is already receiving scheduled IV antibiotics.  Longitudinal  incision was created centered over the superior pole the patella.  A limited medial parapatellar arthrotomy was made.  There was straw-colored cloudy joint fluid.  There was no significant chronic synovial irritation or necrotic tissue.  I excisionally debrided a small piece of synovial tissue in the suprapatellar pouch using scissors to send for tissue culture.  The knee was then copiously irrigated with normal saline using pulse lavage.  A medium Hemovac drain was placed in the lateral gutter.  The arthrotomy was closed with #1 PDS.  Deep dermal closure with 2-0 undyed Monocryl.  Skin was reapproximated with staples followed by Dermabond.  The drain was sewn in with 2-0 nylon.  Dressing was placed to the drain site.  She was then extubated and taken to the PACU in stable condition.  Sponge, needle, and instrument counts were correct at the end of the case x2.  There were no known complications.  POSTOPERATIVE PLAN: Postoperatively, she will be readmitted to the hospitalist service.  Follow synovial fluid culture as well as intraoperative tissue culture.  We will remove the Hemovac drain once the output is less than 30 cc per shift.

## 2021-04-05 NOTE — Transfer of Care (Signed)
Immediate Anesthesia Transfer of Care Note  Patient: Katie Serrano  Procedure(s) Performed: IRRIGATION AND DEBRIDEMENT KNEE (Right)  Patient Location: PACU  Anesthesia Type:General  Level of Consciousness: drowsy, patient cooperative and responds to stimulation  Airway & Oxygen Therapy: Patient Spontanous Breathing  Post-op Assessment: Report given to RN and Post -op Vital signs reviewed and stable  Post vital signs: Reviewed and stable  Last Vitals:  Vitals Value Taken Time  BP 163/74 04/05/21 1642  Temp    Pulse 75 04/05/21 1645  Resp 23 04/05/21 1645  SpO2 93 % 04/05/21 1645  Vitals shown include unvalidated device data.  Last Pain:  Vitals:   04/05/21 1336  TempSrc: Oral  PainSc: 4       Patients Stated Pain Goal: 0 (04/04/21 1006)  Complications: No notable events documented.

## 2021-04-06 DIAGNOSIS — R531 Weakness: Secondary | ICD-10-CM | POA: Diagnosis not present

## 2021-04-06 DIAGNOSIS — E6609 Other obesity due to excess calories: Secondary | ICD-10-CM | POA: Diagnosis not present

## 2021-04-06 DIAGNOSIS — I6310 Cerebral infarction due to embolism of unspecified precerebral artery: Secondary | ICD-10-CM | POA: Diagnosis not present

## 2021-04-06 DIAGNOSIS — R7881 Bacteremia: Secondary | ICD-10-CM | POA: Diagnosis not present

## 2021-04-06 DIAGNOSIS — N10 Acute tubulo-interstitial nephritis: Secondary | ICD-10-CM | POA: Diagnosis not present

## 2021-04-06 DIAGNOSIS — I330 Acute and subacute infective endocarditis: Secondary | ICD-10-CM | POA: Diagnosis not present

## 2021-04-06 LAB — GLUCOSE, CAPILLARY
Glucose-Capillary: 183 mg/dL — ABNORMAL HIGH (ref 70–99)
Glucose-Capillary: 231 mg/dL — ABNORMAL HIGH (ref 70–99)
Glucose-Capillary: 231 mg/dL — ABNORMAL HIGH (ref 70–99)
Glucose-Capillary: 283 mg/dL — ABNORMAL HIGH (ref 70–99)
Glucose-Capillary: 231 mg/dL — ABNORMAL HIGH (ref 70–99)

## 2021-04-06 MED ORDER — SENNA 8.6 MG PO TABS
2.0000 | ORAL_TABLET | Freq: Two times a day (BID) | ORAL | Status: DC
  Administered 2021-04-06 – 2021-04-10 (×5): 17.2 mg via ORAL
  Filled 2021-04-06 (×8): qty 2

## 2021-04-06 MED ORDER — POLYETHYLENE GLYCOL 3350 17 G PO PACK
17.0000 g | PACK | Freq: Every day | ORAL | Status: DC
  Administered 2021-04-06 – 2021-04-10 (×4): 17 g via ORAL
  Filled 2021-04-06 (×4): qty 1

## 2021-04-06 MED ORDER — INSULIN GLARGINE-YFGN 100 UNIT/ML ~~LOC~~ SOLN
15.0000 [IU] | Freq: Every day | SUBCUTANEOUS | Status: DC
  Administered 2021-04-06: 15 [IU] via SUBCUTANEOUS
  Filled 2021-04-06 (×2): qty 0.15

## 2021-04-06 MED ORDER — BISACODYL 10 MG RE SUPP
10.0000 mg | Freq: Once | RECTAL | Status: AC
  Administered 2021-04-06: 10 mg via RECTAL
  Filled 2021-04-06: qty 1

## 2021-04-06 MED ORDER — INSULIN ASPART 100 UNIT/ML IJ SOLN
3.0000 [IU] | Freq: Three times a day (TID) | INTRAMUSCULAR | Status: DC
  Administered 2021-04-06 – 2021-04-07 (×3): 3 [IU] via SUBCUTANEOUS

## 2021-04-06 NOTE — Progress Notes (Signed)
Physical Therapy Treatment Patient Details Name: Katie Serrano MRN: [id number] DOB: 1955/10/20 Today's Date: 04/06/2021    History of Present Illness 65 yo female presenting to ED with fatigue and a fever on 7/25.  Also, reporting of left-sided flank pain. Admitted with UTI and hyponatremia. 04/03/21 MRI lumbar spine-joint effusion left L4-5 facet (arthritis vs septic arthritis); mass effect on left L5 nerve root; edema left>right lower paraspinals suggest myositis; small disc protrusions L3-4 and L4-5 potentially effecting the left L3 and L4 nerve roots. Pt developed incr pain and edema in rt knee 7/29-7/30 and underwent open arthotomy and debridement of rt knee for septic arthritis  on 7/30.  PMH including obesity, Eagle syndrome, elevated CRP, HTN, GERD, asthma, tobacco use, COPD, DM2, HLD, and osteoarthritis.    PT Comments    Pt now with very painful rt knee after surgery yesterday which is further limiting mobility. Currently can not tolerate enough to consider CIR so updated recommendation to SNF. Hopefully as pain improves and pt able to tolerate more activity CIR might be appropriate.    Follow Up Recommendations   (if pain decreases and pt can tolerate more may be able to consider CIR)     Equipment Recommendations  Rolling walker with 5" wheels;3in1 (PT);Wheelchair cushion (measurements PT);Wheelchair (measurements PT)    Recommendations for Other Services       Precautions / Restrictions Precautions Precautions: Fall Precaution Comments: drain to R knee Restrictions RLE Weight Bearing: Weight bearing as tolerated    Mobility  Bed Mobility Overal bed mobility: Needs Assistance Bed Mobility: Supine to Sit     Supine to sit: Min assist;HOB elevated     General bed mobility comments: Assist to bring RLE toward EOB and elevate trunk into long sitting. Pt able to turn so lt foot on floor and rt leg remained up on bed. Unable to tolerate bring RLE all the way off of the  bed.    Transfers                 General transfer comment: Unable due to pain  Ambulation/Gait             General Gait Details: Unable due to pain   Stairs             Wheelchair Mobility    Modified Rankin (Stroke Patients Only)       Balance Overall balance assessment: Needs assistance Sitting-balance support: Feet supported Sitting balance-Leahy Scale: Good                                      Cognition Arousal/Alertness: Awake/alert Behavior During Therapy: WFL for tasks assessed/performed Overall Cognitive Status: Within Functional Limits for tasks assessed                                        Exercises General Exercises - Lower Extremity Ankle Circles/Pumps: AROM;10 reps;Both;Supine Quad Sets: AROM;Left;5 reps (attempted with rt but pt couldn't tolerate due to pain)    General Comments        Pertinent Vitals/Pain Pain Assessment: Faces Faces Pain Scale: Hurts whole lot Pain Location: rt knee with any movement Pain Descriptors / Indicators: Guarding;Grimacing;Stabbing Pain Intervention(s): Limited activity within patient's tolerance;Ice applied;Monitored during session    Home Living  Prior Function            PT Goals (current goals can now be found in the care plan section) Acute Rehab PT Goals PT Goal Formulation: With patient Time For Goal Achievement: 04/20/21 Potential to Achieve Goals: Fair Progress towards PT goals: Goals downgraded-see care plan    Frequency    Min 3X/week      PT Plan Discharge plan needs to be updated    Co-evaluation              AM-PAC PT "6 Clicks" Mobility   Outcome Measure  Help needed turning from your back to your side while in a flat bed without using bedrails?: A Little Help needed moving from lying on your back to sitting on the side of a flat bed without using bedrails?: A Lot Help needed moving to and  from a bed to a chair (including a wheelchair)?: Total Help needed standing up from a chair using your arms (e.g., wheelchair or bedside chair)?: Total Help needed to walk in hospital room?: Total Help needed climbing 3-5 steps with a railing? : Total 6 Click Score: 9    End of Session   Activity Tolerance: Patient limited by pain Patient left: in bed;with call bell/phone within reach;with family/visitor present   PT Visit Diagnosis: Other abnormalities of gait and mobility (R26.89);Muscle weakness (generalized) (M62.81);Difficulty in walking, not elsewhere classified (R26.2);Pain Pain - Right/Left: Right Pain - part of body: Knee     Time: 1227-1255 PT Time Calculation (min) (ACUTE ONLY): 28 min  Charges:  $Therapeutic Activity: 23-37 mins                     [facility] PT Acute Rehabilitation Services Pager [phone] Office [phone]    Angelina Ok [facility] 04/06/2021, 2:38 PM

## 2021-04-06 NOTE — Progress Notes (Signed)
PROGRESS NOTE  Katie Serrano  ZOX:[id number] DOB: 24-Dec-1955 DOA: 03/30/2021 PCP: Gordan Payment., MD   Brief Narrative: Katie Serrano is a 65 y.o. female with a history of HTN, asthma, tobacco use, COPD, T2DM, HLD, OA, and GERD who presented to the ED with fever to 105F and malaise found to have sepsis due to MRSA UTI and bacteremia for which vancomycin has been given. TTE was negative 7/27, subsequent TEE negative 7/29. Due to R > L LE weakness, spinal MR was performed and revealed L4-5 facet septic arthritis, several fluiid collections and mass effect on L5 nerve and myositis near L4-5. Neurosurgery was consulted but did not recommend surgical intervention at this time. Brain MRI was obtained 04/03/21 and showed scattered infarcts involving the left caudate head, left parietal lobe, left splenium, and right temporal lobe. It also showed diffuse leptomeningeal enhancement concerning for meningitis. Neurology was consulted. The patient's right knee has developed painful swelling acutely on 7/30 for which orthopedics is consulted.  Plan currently is to continue IV antibiotics for 6-8 weeks after repeat blood cultures return negative.   Assessment & Plan: Principal Problem:   MRSA bacteremia Active Problems:   Class 1 obesity due to excess calories with serious comorbidity and body mass index (BMI) of 30.0 to 30.9 in adult   Essential hypertension   Mixed hyperlipidemia   Coronary artery disease of native artery of native heart with stable angina pectoris (HCC)   SIRS (systemic inflammatory response syndrome) (HCC)   Type 2 diabetes mellitus with hyperglycemia (HCC)   Hyponatremia   Acute pyelonephritis   Epidural abscess   Weakness of both lower extremities   Septic embolism (HCC)   Acute bacterial endocarditis   Cerebral embolism with cerebral infarction   Acute pain of right knee   Rash   Vertebral osteophyte  Sepsis due to MRSA pyelonephritis, bacteremia with acute facet  arthritis, possible septic arthritis, paraspinal myositis: TTE and subsequent TEE negative for vegetation, negative bubble study.  - Continue monitoring blood cultures from 7/27-7/28. NGTD on last report when checked this morning. If remains negative x5 days, can insert PICC and continue 6-8 weeks IV abx.   Right knee septic arthritis:  - Monitor arthrocentesis and operative cultures, both with no organisms on gram stain.  - Abx as above.  - PT/OT, WBAT, lovenox for DVT ppx, dilaudid as part of multimodal pain control.   Constipation: Severe.  - Has been on BID bowel regimen, though now escalating narcotics. Will increase laxative dosing, add miralax and give suppository x1 today.   Pustular lesions: Typical of MRSA boils consistent with patient's bacteremia.  - Continue monitoring. Stabilized not consistent with alternative etiologies including AGEP.   B/L Neuropathic and cramping lower extremity pain: MR with concern for septic arthritis/acute facet arthritis, mass-effect of L5 nerve root, and marrow edema in the paraspinous musculature concerning for acute myositis. - Neurosurgery consulted, recommending against surgery/instrumentation.   Hyponatremia, suspect hypovolemia: Resolved.   Essential hypertension - Increased norvasc  > .  - Continue losartan 50 mg bedtime - Continue metoprolol 25 mg BID.  - Continue IV hydralazine as needed    Mixed hyperlipidemia - Statin stopped due to her allergy and concerns. Continue lovaza   CAD: No angina.  - Continue BB, lovaza, ASA .    Uncontrolled T2DM with hyperglycemia:  - Tight glycemic control to decrease mortality with bacteremia. Hold metformin.  - Further increase lantus to 15u, continue moderate SSI, add mealtime novolog 3u.  DVT prophylaxis: Lovenox Code Status: Full Family Communication: Husband at bedside daily. Disposition Plan:  Status is: Inpatient  Remains inpatient appropriate because:Ongoing diagnostic  testing needed not appropriate for outpatient work up and Inpatient level of care appropriate due to severity of illness  Dispo: The patient is from: Home              Anticipated d/c is to: Home              Patient currently is not medically stable to d/c.   Difficult to place patient No  Consultants:  Neurosurgery ID Neurology Orthopedic surgery  Procedures:  TEE Right knee bedside arthrocentesis then arthrotomy and drainage, drain placement 04/05/2021 Dr. Linna Caprice.   Antimicrobials: Vancomycin   Subjective: Pain in knee is severe, but much improved with dilaudid without noted sedation. Hasn't had BM in days, no abd pain. Eating ok. Reluctant to get up with therapy.  Objective: Vitals:   04/05/21 1712 04/05/21 1757 04/05/21 2133 04/06/21 0458  BP: (!) 152/79 (!) 157/87 (!) 144/78 139/79  Pulse: 72 79 81 74  Resp: Temp: (!) 97.5 F (36.4 C) 98.6 F (37 C) 98.2 F (36.8 C) 98.8 F (37.1 C)  TempSrc:  Oral Oral Oral  SpO2: 93% 95% 94% 94%  Weight:      Height:        Intake/Output Summary (Last 24 hours) at 04/06/2021 1111 Last data filed at 04/06/2021 0502 Gross per 24 hour  Intake 1050 ml  Output 2150 ml  Net -1100 ml   Filed Weights   03/30/21 2345 04/05/21 1336  Weight: 83.9 kg 80.7 kg   Gen: 65 y.o. female in no distress Pulm: Nonlabored breathing room air. Clear. CV: Regular rate and rhythm. No murmur, rub, or gallop. No JVD, no significant dependent edema. GI: Abdomen soft, mildly distended but non-tender with hypoactive bowel sounds.  Ext: Warm, dry,sensation and movement intact distal LE's. Right knee w/c/d/I dressing, serosanguinous drain output. Skin: Scattered small boils/pustules are stable. No mucous membrane involvement. Neuro: Alert and oriented. No new focal neurological deficits. Psych: Judgement and insight appear fair. Mood euthymic & affect congruent. Behavior is appropriate.    Data Reviewed: I have personally reviewed  following labs and imaging studies  CBC: Recent Labs  Lab 03/31/21 0442 04/01/21 0504 04/02/21 0353 04/03/21 0015 04/04/21 0359 04/05/21 0322 04/05/21 1802  WBC 21.5*   < > 23.7* 22.8* 18.0* 19.7* 23.4*  NEUTROABS 17.6*  --   --   --   --   --   --   HGB 14.0   < > 12.5 12.5 12.8 12.1 12.5  HCT 42.1   < > 36.6 36.2 37.0 36.4 36.7  MCV 87.9   < > 85.3 85.2 84.1 86.1 86.4  PLT 214   < > 273 350 401* 447* 499*   < > = values in this interval not displayed.   Basic Metabolic Panel: Recent Labs  Lab 04/01/21 0504 04/02/21 0353 04/03/21 0015 04/04/21 0359 04/05/21 0322 04/05/21 1802  NA 132* 131* 133* 135 136  --   K 3.2* 3.5 3.3* 3.4* 3.3*  --   CL 96* 98 101 101 98  --   CO2 --   GLUCOSE 247* 207* 179* 221* 218*  --   BUN --   CREATININE 0.87 0.87 0.88 0.81 0.82 0.85  CALCIUM 8.6* 8.4* 8.7* 8.7* 8.6*  --  MG 2.3 2.3 2.2 2.0 2.0  --    GFR: Estimated Creatinine Clearance: 70.7 mL/min (by C-G formula based on SCr of 0.85 mg/dL). Liver Function Tests: Recent Labs  Lab 03/31/21 0442  AST 36  ALT 52*  ALKPHOS 72  BILITOT 0.8  PROT 6.4*  ALBUMIN 2.7*   No results for input(s): LIPASE, AMYLASE in the last 168 hours. No results for input(s): AMMONIA in the last 168 hours. Coagulation Profile: No results for input(s): INR, PROTIME in the last 168 hours. Cardiac Enzymes: Recent Labs  Lab 03/31/21 0442 04/01/21 1108  CKTOTAL 244* 62   BNP (last 3 results) No results for input(s): PROBNP in the last 8760 hours. HbA1C: No results for input(s): HGBA1C in the last 72 hours. CBG: Recent Labs  Lab 04/05/21 1124 04/05/21 1340 04/05/21 1645 04/05/21 2128 04/06/21 0616  GLUCAP 207* 145* 156* 379* 231*   Lipid Profile: Recent Labs    04/05/21 1802  CHOL 87  HDL 19*  LDLCALC 44  TRIG 967  CHOLHDL 4.6   Thyroid Function Tests: No results for input(s): TSH, T4TOTAL, FREET4, T3FREE, THYROIDAB in the last 72 hours. Anemia  Panel: No results for input(s): VITAMINB12, FOLATE, FERRITIN, TIBC, IRON, RETICCTPCT in the last 72 hours. Urine analysis:    Component Value Date/Time   COLORURINE YELLOW 03/31/2021 0239   APPEARANCEUR CLOUDY (A) 03/31/2021 0239   LABSPEC 1.009 03/31/2021 0239   PHURINE 5.0 03/31/2021 0239   GLUCOSEU >=500 (A) 03/31/2021 0239   HGBUR SMALL (A) 03/31/2021 0239   BILIRUBINUR NEGATIVE 03/31/2021 0239   KETONESUR 20 (A) 03/31/2021 0239   PROTEINUR 100 (A) 03/31/2021 0239   NITRITE NEGATIVE 03/31/2021 0239   LEUKOCYTESUR MODERATE (A) 03/31/2021 0239   Recent Results (from the past 240 hour(s))  Resp Panel by RT-PCR (Flu A&B, Covid) Nasopharyngeal Swab     Status: None   Collection Time: 03/31/21 12:21 AM   Specimen: Nasopharyngeal Swab; Nasopharyngeal(NP) swabs in vial transport medium  Result Value Ref Range Status   SARS Coronavirus 2 by RT PCR NEGATIVE NEGATIVE Final    Comment: (NOTE) SARS-CoV-2 target nucleic acids are NOT DETECTED.  The SARS-CoV-2 RNA is generally detectable in upper respiratory specimens during the acute phase of infection. The lowest concentration of SARS-CoV-2 viral copies this assay can detect is 138 copies/mL. A negative result does not preclude SARS-Cov-2 infection and should not be used as the sole basis for treatment or other patient management decisions. A negative result may occur with  improper specimen collection/handling, submission of specimen other than nasopharyngeal swab, presence of viral mutation(s) within the areas targeted by this assay, and inadequate number of viral copies(<138 copies/mL). A negative result must be combined with clinical observations, patient history, and epidemiological information. The expected result is Negative.  Fact Sheet for Patients:  BloggerCourse.com  Fact Sheet for Healthcare Providers:  SeriousBroker.it  This test is no t yet approved or cleared by the  [place] FDA and  has been authorized for detection and/or diagnosis of SARS-CoV-2 by FDA under an Emergency Use Authorization (EUA). This EUA will remain  in effect (meaning this test can be used) for the duration of the COVID-19 declaration under Section 564(b)(1) of the Act, 21 U.S.C.section 360bbb-3(b)(1), unless the authorization is terminated  or revoked sooner.       Influenza A by PCR NEGATIVE NEGATIVE Final   Influenza B by PCR NEGATIVE NEGATIVE Final    Comment: (NOTE) The Xpert Xpress SARS-CoV-2/FLU/RSV plus assay is intended as  an aid in the diagnosis of influenza from Nasopharyngeal swab specimens and should not be used as a sole basis for treatment. Nasal washings and aspirates are unacceptable for Xpert Xpress SARS-CoV-2/FLU/RSV testing.  Fact Sheet for Patients: BloggerCourse.com  Fact Sheet for Healthcare Providers: SeriousBroker.it  This test is not yet approved or cleared by the [place] FDA and has been authorized for detection and/or diagnosis of SARS-CoV-2 by FDA under an Emergency Use Authorization (EUA). This EUA will remain in effect (meaning this test can be used) for the duration of the COVID-19 declaration under Section 564(b)(1) of the Act, 21 U.S.C. section 360bbb-3(b)(1), unless the authorization is terminated or revoked.  Performed at [facility], [address], [place], [place] [address]   Urine Culture     Status: Abnormal   Collection Time: 03/31/21  5:51 AM   Specimen: Urine, Clean Catch  Result Value Ref Range Status   Specimen Description URINE, CLEAN CATCH  Final   Special Requests NONE  Final   Culture (A)  Final    >=100,000 COLONIES/mL METHICILLIN RESISTANT STAPHYLOCOCCUS AUREUS 40,000 COLONIES/mL AEROCOCCUS SPECIES Standardized susceptibility testing for this organism is not available. Performed at [facility], [address], [place], [place] [address]     Report Status 04/02/2021 FINAL  Final   Organism ID, Bacteria METHICILLIN RESISTANT STAPHYLOCOCCUS AUREUS (A)  Final      Susceptibility   Methicillin resistant staphylococcus aureus - MIC*    CIPROFLOXACIN >=8 RESISTANT Resistant     GENTAMICIN <=0.5 SENSITIVE Sensitive     NITROFURANTOIN <=16 SENSITIVE Sensitive     OXACILLIN >=4 RESISTANT Resistant     TETRACYCLINE <=1 SENSITIVE Sensitive     VANCOMYCIN 1 SENSITIVE Sensitive     TRIMETH/SULFA <=10 SENSITIVE Sensitive     CLINDAMYCIN <=0.25 SENSITIVE Sensitive     RIFAMPIN <=0.5 SENSITIVE Sensitive     Inducible Clindamycin NEGATIVE Sensitive     * >=100,000 COLONIES/mL METHICILLIN RESISTANT STAPHYLOCOCCUS AUREUS  Culture, blood (Routine X 2) w Reflex to ID Panel     Status: Abnormal   Collection Time: 04/01/21 11:08 AM   Specimen: BLOOD  Result Value Ref Range Status   Specimen Description BLOOD LEFT ANTECUBITAL  Final   Special Requests   Final    BOTTLES DRAWN AEROBIC AND ANAEROBIC Blood Culture adequate volume   Culture  Setup Time   Final    GRAM POSITIVE COCCI IN CLUSTERS IN BOTH AEROBIC AND ANAEROBIC BOTTLES Organism ID to follow CRITICAL RESULT CALLED TO, READ BACK BY AND VERIFIED WITH: PARMD MIRANDA BRYK BY MESSAN H. AT 0552 02 7 27 2022 Performed at [facility], [address] [place], [place] [address]    Culture METHICILLIN RESISTANT STAPHYLOCOCCUS AUREUS (A)  Final   Report Status 04/04/2021 FINAL  Final   Organism ID, Bacteria METHICILLIN RESISTANT STAPHYLOCOCCUS AUREUS  Final      Susceptibility   Methicillin resistant staphylococcus aureus - MIC*    CIPROFLOXACIN >=8 RESISTANT Resistant     ERYTHROMYCIN >=8 RESISTANT Resistant     GENTAMICIN <=0.5 SENSITIVE Sensitive     OXACILLIN >=4 RESISTANT Resistant     TETRACYCLINE <=1 SENSITIVE Sensitive     VANCOMYCIN 1 SENSITIVE Sensitive     TRIMETH/SULFA <=10 SENSITIVE Sensitive     CLINDAMYCIN <=0.25 SENSITIVE Sensitive     RIFAMPIN <=0.5 SENSITIVE  Sensitive     Inducible Clindamycin NEGATIVE Sensitive     * METHICILLIN RESISTANT STAPHYLOCOCCUS AUREUS  Blood  Culture ID Panel (Reflexed)     Status: Abnormal   Collection Time: 04/01/21 11:08 AM  Result Value Ref Range Status   Enterococcus faecalis NOT DETECTED NOT DETECTED Final   Enterococcus Faecium NOT DETECTED NOT DETECTED Final   Listeria monocytogenes NOT DETECTED NOT DETECTED Final   Staphylococcus species DETECTED (A) NOT DETECTED Final    Comment: CRITICAL RESULT CALLED TO, READ BACK BY AND VERIFIED WITH: PARMD MIRANDA BRYK BY MESSAN H. AT 0552 02 7 27 2022    Staphylococcus aureus (BCID) DETECTED (A) NOT DETECTED Final    Comment: Methicillin (oxacillin)-resistant Staphylococcus aureus (MRSA). MRSA is predictably resistant to beta-lactam antibiotics (except ceftaroline). Preferred therapy is vancomycin unless clinically contraindicated. Patient requires contact precautions if  hospitalized. CRITICAL RESULT CALLED TO, READ BACK BY AND VERIFIED WITH: PARMD MIRANDA BRYK BY MESSAN H. AT 0552 02 7 27 2022    Staphylococcus epidermidis NOT DETECTED NOT DETECTED Final   Staphylococcus lugdunensis NOT DETECTED NOT DETECTED Final   Streptococcus species NOT DETECTED NOT DETECTED Final   Streptococcus agalactiae NOT DETECTED NOT DETECTED Final   Streptococcus pneumoniae NOT DETECTED NOT DETECTED Final   Streptococcus pyogenes NOT DETECTED NOT DETECTED Final   A.calcoaceticus-baumannii NOT DETECTED NOT DETECTED Final   Bacteroides fragilis NOT DETECTED NOT DETECTED Final   Enterobacterales NOT DETECTED NOT DETECTED Final   Enterobacter cloacae complex NOT DETECTED NOT DETECTED Final   Escherichia coli NOT DETECTED NOT DETECTED Final   Klebsiella aerogenes NOT DETECTED NOT DETECTED Final   Klebsiella oxytoca NOT DETECTED NOT DETECTED Final   Klebsiella pneumoniae NOT DETECTED NOT DETECTED Final   Proteus species NOT DETECTED NOT DETECTED Final   Salmonella species NOT DETECTED  NOT DETECTED Final   Serratia marcescens NOT DETECTED NOT DETECTED Final   Haemophilus influenzae NOT DETECTED NOT DETECTED Final   Neisseria meningitidis NOT DETECTED NOT DETECTED Final   Pseudomonas aeruginosa NOT DETECTED NOT DETECTED Final   Stenotrophomonas maltophilia NOT DETECTED NOT DETECTED Final   Candida albicans NOT DETECTED NOT DETECTED Final   Candida auris NOT DETECTED NOT DETECTED Final   Candida glabrata NOT DETECTED NOT DETECTED Final   Candida krusei NOT DETECTED NOT DETECTED Final   Candida parapsilosis NOT DETECTED NOT DETECTED Final   Candida tropicalis NOT DETECTED NOT DETECTED Final   Cryptococcus neoformans/gattii NOT DETECTED NOT DETECTED Final   Meth resistant mecA/C and MREJ DETECTED (A) NOT DETECTED Final    Comment: CRITICAL RESULT CALLED TO, READ BACK BY AND VERIFIED WITH: PARMD MIRANDA BRYK BY MESSAN H. AT 0552 02 7 27 2022 Performed at [facility], [address] [place], [place] [address]   Culture, blood (Routine X 2) w Reflex to ID Panel     Status: Abnormal   Collection Time: 04/01/21 11:18 AM   Specimen: BLOOD RIGHT HAND  Result Value Ref Range Status   Specimen Description BLOOD RIGHT HAND  Final   Special Requests   Final    BOTTLES DRAWN AEROBIC ONLY Blood Culture adequate volume   Culture  Setup Time   Final    GRAM POSITIVE COCCI IN CLUSTERS AEROBIC BOTTLE ONLY CRITICAL VALUE NOTED.  VALUE IS CONSISTENT WITH PREVIOUSLY REPORTED AND CALLED VALUE.    Culture (A)  Final    STAPHYLOCOCCUS AUREUS SUSCEPTIBILITIES PERFORMED ON PREVIOUS CULTURE WITHIN THE LAST 5 DAYS. Performed at [facility], [address] [place], [place] [address]    Report Status 04/04/2021 FINAL  Final  Culture, blood (Routine X  2) w Reflex to ID Panel     Status: None (Preliminary result)   Collection Time: 04/02/21 11:50 PM   Specimen: BLOOD RIGHT HAND  Result Value Ref Range Status   Specimen Description BLOOD RIGHT HAND  Final   Special Requests    Final    BOTTLES DRAWN AEROBIC ONLY Blood Culture adequate volume   Culture   Final    NO GROWTH 2 DAYS Performed at [facility], [address] [place], [place] [address]    Report Status PENDING  Incomplete  Culture, blood (Routine X 2) w Reflex to ID Panel     Status: None (Preliminary result)   Collection Time: 04/03/21 12:09 AM   Specimen: BLOOD LEFT HAND  Result Value Ref Range Status   Specimen Description BLOOD LEFT HAND  Final   Special Requests   Final    BOTTLES DRAWN AEROBIC ONLY Blood Culture adequate volume   Culture   Final    NO GROWTH 2 DAYS Performed at [facility], [address], [place], [place] [address]    Report Status PENDING  Incomplete  Body fluid culture w Gram Stain     Status: None (Preliminary result)   Collection Time: 04/05/21 10:52 AM   Specimen: Synovium; Synovial Fluid  Result Value Ref Range Status   Specimen Description SYNOVIAL FLUID  Final   Special Requests RIGHT KNEE  Final   Gram Stain   Final    ABUNDANT WBC PRESENT, PREDOMINANTLY PMN NO ORGANISMS SEEN Gram Stain Report Called to,Read Back By and Verified With: SWINTEK MD @1425  04/05/21 EB   Performed at [facility], [address], [place], [place]    Culture PENDING  Incomplete   Report Status PENDING  Incomplete  Aerobic/Anaerobic Culture w Gram Stain (surgical/deep wound)     Status: None (Preliminary result)   Collection Time: 04/05/21  4:07 PM   Specimen: Tissue  Result Value Ref Range Status   Specimen Description TISSUE  Final   Special Requests RIGHT KNEE SYNOVIUM SPEC A  Final   Gram Stain   Final    RARE WBC PRESENT, PREDOMINANTLY MONONUCLEAR NO ORGANISMS SEEN Performed at [facility], [address], [place], [place]    Culture PENDING  Incomplete   Report Status PENDING  Incomplete      Radiology Studies: ECHO TEE  Result Date: 04/04/2021    TRANSESOPHOGEAL ECHO REPORT   Patient Name:   Katie Serrano Date of  Exam: 04/04/2021 Medical Rec #:  04/06/2021       Height:       66.0 in Accession #:    [id number]      Weight:       185.0 lb Date of Birth:  24-Jun-1956       BSA:          1.934 m Patient Age:    64 years        BP:           154/87 mmHg Patient Gender: F               HR:           71 bpm. Exam Location:  Inpatient Procedure: 3D Echo, Cardiac Doppler, Color Doppler, Saline Contrast Bubble Study            and Transesophageal Echo Indications:     Bacteremia R78.81  History:  Patient has prior history of Echocardiogram examinations, most                  recent 04/02/2021. CAD, COPD, Signs/Symptoms:Fever and Fatigue;                  Risk Factors:Hypertension, Diabetes, Dyslipidemia and Current                  Smoker.  Sonographer:     Elmarie Shileyiffany Dance Referring Phys:  [id number] Loura HaltANKIT CHIRAG AMIN Diagnosing Phys: Lennie OdorWesley O'Neal MD PROCEDURE: After discussion of the risks and benefits of a TEE, an informed consent was obtained from the patient. TEE procedure time was 10 minutes. The transesophogeal probe was passed without difficulty through the esophogus of the patient. Imaged were obtained with the patient in a left lateral decubitus position. Local oropharyngeal anesthetic was provided with Cetacaine. Sedation performed by different physician. The patient was monitored while under deep sedation. Anesthestetic sedation was provided intravenously by Anesthesiology: 96mg  of Propofol, 100mg  of Lidocaine. Image quality was excellent. The patient's vital signs; including heart rate, blood pressure, and oxygen saturation; remained stable throughout the procedure. The patient developed no complications during the procedure. IMPRESSIONS  1. Left ventricular ejection fraction, by estimation, is 55 to 60%. The left ventricle has normal function.  2. Right ventricular systolic function is normal. The right ventricular size is normal.  3. No left atrial/left atrial appendage thrombus was detected.  4. The mitral valve is  grossly normal. Mild mitral valve regurgitation. No evidence of mitral stenosis.  5. The aortic valve is tricuspid. Aortic valve regurgitation is not visualized. No aortic stenosis is present.  6. There is mild (Grade II) layered plaque involving the descending aorta.  7. Agitated saline contrast bubble study was negative, with no evidence of any interatrial shunt. Conclusion(s)/Recommendation(s): No evidence of vegetation/infective endocarditis on this transesophageal echocardiogram. FINDINGS  Left Ventricle: Left ventricular ejection fraction, by estimation, is 55 to 60%. The left ventricle has normal function. The left ventricular internal cavity size was normal in size. Right Ventricle: The right ventricular size is normal. No increase in right ventricular wall thickness. Right ventricular systolic function is normal. Left Atrium: Left atrial size was normal in size. No left atrial/left atrial appendage thrombus was detected. Right Atrium: Right atrial size was normal in size. Pericardium: Trivial pericardial effusion is present. Mitral Valve: The mitral valve is grossly normal. Mild mitral valve regurgitation. No evidence of mitral valve stenosis. There is no evidence of mitral valve vegetation. Tricuspid Valve: The tricuspid valve is normal in structure. Tricuspid valve regurgitation is trivial. No evidence of tricuspid stenosis. There is no evidence of tricuspid valve vegetation. Aortic Valve: The aortic valve is tricuspid. Aortic valve regurgitation is not visualized. No aortic stenosis is present. There is no evidence of aortic valve vegetation. Pulmonic Valve: The pulmonic valve was normal in structure. Pulmonic valve regurgitation is trivial. No evidence of pulmonic stenosis. There is no evidence of pulmonic valve vegetation. Aorta: The aortic root and ascending aorta are structurally normal, with no evidence of dilitation. There is mild (Grade II) layered plaque involving the descending aorta. Venous:  The left upper pulmonary vein, left lower pulmonary vein, right upper pulmonary vein and right lower pulmonary vein are normal. IAS/Shunts: No atrial level shunt detected by color flow Doppler. Agitated saline contrast was given intravenously to evaluate for intracardiac shunting. Agitated saline contrast bubble study was negative, with no evidence of any interatrial shunt.   AORTA Ao Root  diam: 2.86 cm Ao Asc diam:  3.27 cm TRICUSPID VALVE TR Peak grad:   16.5 mmHg TR Vmax:        203.00 cm/s Lennie Odor MD Electronically signed by Lennie Odor MD Signature Date/Time: 04/04/2021/1:31:34 PM    Final    CT ANGIO HEAD CODE STROKE  Result Date: 04/05/2021 CLINICAL DATA:  Follow-up examination for acute stroke. EXAM: CT ANGIOGRAPHY HEAD AND NECK TECHNIQUE: Multidetector CT imaging of the head and neck was performed using the standard protocol during bolus administration of intravenous contrast. Multiplanar CT image reconstructions and MIPs were obtained to evaluate the vascular anatomy. Carotid stenosis measurements (when applicable) are obtained utilizing NASCET criteria, using the distal internal carotid diameter as the denominator. CONTRAST:  77mL OMNIPAQUE IOHEXOL 350 MG/ML SOLN COMPARISON:  MRI from 04/03/2021. FINDINGS: CT HEAD FINDINGS Brain: Previously identified scattered subcentimeter acute ischemic infarcts not well visualized by CT. No other acute large vessel territory infarct. No intracranial hemorrhage. No mass lesion, mass effect, or midline shift. No hydrocephalus or extra-axial fluid collection. Vascular: No hyperdense vessel. Skull: Scalp soft tissues and calvarium within normal limits. Sinuses: Paranasal sinuses and mastoid air cells are clear. Orbits: Globes and orbital soft tissues demonstrate no acute finding. Review of the MIP images confirms the above findings CTA NECK FINDINGS Aortic arch: Visualized aortic arch normal caliber with normal branch pattern. Mild atheromatous change about the  visualized arch. No hemodynamically significant stenosis seen about the origin the great vessels. Right carotid system: Right common and internal carotid arteries widely patent without stenosis, dissection or occlusion. Left carotid system: Left common and internal carotid arteries patent without stenosis, dissection or occlusion. Vertebral arteries: Left vertebral artery arises directly from the aortic arch. Vertebral arteries widely patent within the neck without stenosis, dissection or occlusion. Skeleton: Prior ACDF at C5-C7. No discrete or worrisome osseous lesions. Patient is edentulous. Other neck: No other acute soft tissue abnormality within the neck. No mass or adenopathy. Upper chest: Visualized upper chest demonstrates no acute finding. Review of the MIP images confirms the above findings CTA HEAD FINDINGS Anterior circulation: Petrous segments patent bilaterally. Mild atheromatous change within the carotid siphons without significant stenosis. A1 segments widely patent. Normal anterior communicating artery complex. Anterior cerebral arteries patent to their distal aspects without stenosis. No M1 stenosis or occlusion. Normal MCA bifurcations. Distal MCA branches well perfused and symmetric. Posterior circulation: Both V4 segments patent to the vertebrobasilar junction without stenosis. Both PICA origins patent and normal. Basilar widely patent to its distal aspect without stenosis. Superior cerebellar arteries patent bilaterally. Both PCAs primarily supplied via the basilar and are well perfused to there distal aspects. Venous sinuses: Grossly patent allowing for timing the contrast bolus. Anatomic variants: None significant.  No aneurysm. Review of the MIP images confirms the above findings IMPRESSION: CT HEAD IMPRESSION: 1. Previously identified scattered subcentimeter ischemic infarcts not well visualized by CT. 2. No other new acute intracranial abnormality. CTA HEAD AND NECK IMPRESSION: Negative  CTA of the head and neck. No large vessel occlusion. No hemodynamically significant or correctable stenosis. Electronically Signed   By: Rise Mu M.D.   On: 04/05/2021 00:20   CT ANGIO NECK CODE STROKE  Result Date: 04/05/2021 CLINICAL DATA:  Follow-up examination for acute stroke. EXAM: CT ANGIOGRAPHY HEAD AND NECK TECHNIQUE: Multidetector CT imaging of the head and neck was performed using the standard protocol during bolus administration of intravenous contrast. Multiplanar CT image reconstructions and MIPs were obtained to evaluate the vascular anatomy. Carotid  stenosis measurements (when applicable) are obtained utilizing NASCET criteria, using the distal internal carotid diameter as the denominator. CONTRAST:  75mL OMNIPAQUE IOHEXOL 350 MG/ML SOLN COMPARISON:  MRI from 04/03/2021. FINDINGS: CT HEAD FINDINGS Brain: Previously identified scattered subcentimeter acute ischemic infarcts not well visualized by CT. No other acute large vessel territory infarct. No intracranial hemorrhage. No mass lesion, mass effect, or midline shift. No hydrocephalus or extra-axial fluid collection. Vascular: No hyperdense vessel. Skull: Scalp soft tissues and calvarium within normal limits. Sinuses: Paranasal sinuses and mastoid air cells are clear. Orbits: Globes and orbital soft tissues demonstrate no acute finding. Review of the MIP images confirms the above findings CTA NECK FINDINGS Aortic arch: Visualized aortic arch normal caliber with normal branch pattern. Mild atheromatous change about the visualized arch. No hemodynamically significant stenosis seen about the origin the great vessels. Right carotid system: Right common and internal carotid arteries widely patent without stenosis, dissection or occlusion. Left carotid system: Left common and internal carotid arteries patent without stenosis, dissection or occlusion. Vertebral arteries: Left vertebral artery arises directly from the aortic arch. Vertebral  arteries widely patent within the neck without stenosis, dissection or occlusion. Skeleton: Prior ACDF at C5-C7. No discrete or worrisome osseous lesions. Patient is edentulous. Other neck: No other acute soft tissue abnormality within the neck. No mass or adenopathy. Upper chest: Visualized upper chest demonstrates no acute finding. Review of the MIP images confirms the above findings CTA HEAD FINDINGS Anterior circulation: Petrous segments patent bilaterally. Mild atheromatous change within the carotid siphons without significant stenosis. A1 segments widely patent. Normal anterior communicating artery complex. Anterior cerebral arteries patent to their distal aspects without stenosis. No M1 stenosis or occlusion. Normal MCA bifurcations. Distal MCA branches well perfused and symmetric. Posterior circulation: Both V4 segments patent to the vertebrobasilar junction without stenosis. Both PICA origins patent and normal. Basilar widely patent to its distal aspect without stenosis. Superior cerebellar arteries patent bilaterally. Both PCAs primarily supplied via the basilar and are well perfused to there distal aspects. Venous sinuses: Grossly patent allowing for timing the contrast bolus. Anatomic variants: None significant.  No aneurysm. Review of the MIP images confirms the above findings IMPRESSION: CT HEAD IMPRESSION: 1. Previously identified scattered subcentimeter ischemic infarcts not well visualized by CT. 2. No other new acute intracranial abnormality. CTA HEAD AND NECK IMPRESSION: Negative CTA of the head and neck. No large vessel occlusion. No hemodynamically significant or correctable stenosis. Electronically Signed   By: Rise Mu M.D.   On: 04/05/2021 00:20    Scheduled Meds:  docusate sodium  100 mg Oral BID   enoxaparin (LOVENOX) injection  40 mg Subcutaneous Q24H   insulin aspart  0-15 Units Subcutaneous TID WC   insulin aspart  0-5 Units Subcutaneous QHS   insulin aspart  3 Units  Subcutaneous TID WC   insulin glargine-yfgn  15 Units Subcutaneous Daily   losartan  50 mg Oral QHS   metoprolol tartrate  25 mg Oral BID   omega-3 acid ethyl esters  1 g Oral Daily   senna  1 tablet Oral BID   Continuous Infusions:  vancomycin 750 mg (04/06/21 0933)     LOS: 5 days   Time spent: 35 minutes.  Tyrone Nine, MD [facility] [email or web address] 04/06/2021, 11:11 AM

## 2021-04-06 NOTE — Progress Notes (Signed)
Occupational Therapy Evaluation Patient Details Name: Katie Serrano MRN: [id number] DOB: 04-29-1956 Today's Date: 04/06/2021    History of Present Illness 65 yo female presenting to ED with fatigue and a fever on 7/25.  Also, reporting of left-sided flank pain. Admitted with UTI and hyponatremia. 04/03/21 MRI lumbar spine-joint effusion left L4-5 facet (arthritis vs septic arthritis); mass effect on left L5 nerve root; edema left>right lower paraspinals suggest myositis; small disc protrusions L3-4 and L4-5 potentially effecting the left L3 and L4 nerve roots. 7/30 IRRIGATION AND DEBRIDEMENT KNEE (Right).  PMH including obesity, Eagle syndrome, elevated CRP, HTN, GERD, asthma, tobacco use, COPD, DM2, HLD, and osteoarthritis.   Clinical Impression    Katie Serrano was evaluated s/p the above I&D of her R knee. PTA pt was indep in all ADL/IADLs. She lives in a 1 level home with 3 STE with her husband. Evaluation performed at bed level due to pain in R knee with any movement. Pt was able to scoot/boost herself towards the EOB, however was unable to rotate her hips and trunk to sit EOB due to pain. She required significantly increased time and heavy use of rails for all movements. Lower body ADLs at bed level required max A at this time, upper body ADLs are set up. Pt benefits from continued OT acutely to progress function in ADLs and mobility. Recommend SNF at discharge pending pt pain and progress.    Follow Up Recommendations  SNF;Supervision/Assistance - 24 hour    Equipment Recommendations  3 in 1 bedside commode;Other (comment) (RW & possibly wc pending pt progress)       Precautions / Restrictions Precautions Precautions: Fall Precaution Comments: drain to R knee Restrictions Weight Bearing Restrictions: Yes RLE Weight Bearing: Weight bearing as tolerated      Mobility Bed Mobility Overal bed mobility: Needs Assistance Bed Mobility: Rolling Rolling: Min assist         General bed  mobility comments: min A for management of RLE, pt able to boost her hips to laterally scoot towards the EOB, unable to tolerate flexion of R knee to sit EOB    Transfers Overall transfer level: Needs assistance               General transfer comment: defer this session due to pain    Balance Overall balance assessment: Needs assistance Sitting-balance support: Feet supported;Single extremity supported Sitting balance-Leahy Scale: Fair Sitting balance - Comments: pt able to balance in long sitting in bed         ADL either performed or assessed with clinical judgement   ADL Overall ADL's : Needs assistance/impaired Eating/Feeding: Independent;Bed level   Grooming: Wash/dry hands;Wash/dry face;Applying deodorant;Oral care;Bed level;Set up   Upper Body Bathing: Set up;Bed level   Lower Body Bathing: Maximal assistance;Bed level   Upper Body Dressing : Sitting;Set up Upper Body Dressing Details (indicate cue type and reason): long sitting in bed Lower Body Dressing: Maximal assistance;Bed level   Toilet Transfer: Total assistance (bed pan)   Toileting- Clothing Manipulation and Hygiene: Maximal assistance;Bed level       Functional mobility during ADLs: Minimal assistance (bed level only hthis session) General ADL Comments: Pt unable to sit EOB this session due to pain in R knee with movement. ADLs completed at bed level     Vision Patient Visual Report: No change from baseline Vision Assessment?: No apparent visual deficits            Pertinent Vitals/Pain Pain Assessment: 0-10 Pain Score: 10-Worst  pain ever Faces Pain Scale: Hurts even more Pain Location: R knee with any movement Pain Descriptors / Indicators: Discomfort;Grimacing;Guarding Pain Intervention(s): Limited activity within patient's tolerance;Monitored during session     Hand Dominance Right   Extremity/Trunk Assessment Upper Extremity Assessment Upper Extremity Assessment: Generalized  weakness   Lower Extremity Assessment Lower Extremity Assessment: Defer to PT evaluation   Cervical / Trunk Assessment Cervical / Trunk Assessment: Kyphotic   Communication Communication Communication: No difficulties   Cognition Arousal/Alertness: Awake/alert Behavior During Therapy: WFL for tasks assessed/performed Overall Cognitive Status: Within Functional Limits for tasks assessed             General Comments: Anxious to attempt any movement due to pain   General Comments  Pt reporting 10/10 pain in R knee with movement, session limited by pain            Home Living Family/patient expects to be discharged to:: Private residence Living Arrangements: Spouse/significant other Available Help at Discharge: Family;Available 24 hours/day Type of Home: House Home Access: Stairs to enter [employer] of Steps: 3 Entrance Stairs-Rails: Can reach both Home Layout: One level     Bathroom Shower/Tub: Walk-in shower;Tub/shower [employer]writer, advertising: Standard     Home Equipment: Shower seat - built in   Additional Comments: Pt husband present and supportive throughout session      Prior Functioning/Environment Level of Independence: Independent        Comments: Pt watches grandchildren and great grand children daily, also likes to garden        OT Problem List: Decreased range of motion;Decreased strength;Decreased activity tolerance;Impaired balance (sitting and/or standing);Decreased safety awareness;Decreased knowledge of use of DME or AE;Decreased knowledge of precautions;Pain      OT Treatment/Interventions: Self-care/ADL training;Therapeutic exercise;DME and/or AE instruction;Therapeutic activities;Cognitive remediation/compensation;Patient/family education;Balance training    OT Goals(Current goals can be found in the care plan section) Acute Rehab OT Goals Patient Stated Goal: get back to PLOF OT Goal Formulation: With patient Time For Goal  Achievement: 04/20/21 Potential to Achieve Goals: Good  OT Frequency: Min 2X/week    AM-PAC OT "6 Clicks" Daily Activity     Outcome Measure Help from another person eating meals?: None Help from another person taking care of personal grooming?: A Little Help from another person toileting, which includes using toliet, bedpan, or urinal?: A Lot Help from another person bathing (including washing, rinsing, drying)?: A Lot Help from another person to put on and taking off regular upper body clothing?: A Little Help from another person to put on and taking off regular lower body clothing?: A Lot 6 Click Score: 16   End of Session Nurse Communication: Mobility status;Precautions;Weight bearing status  Activity Tolerance: Patient limited by pain Patient left: in bed;with family/visitor present;with call bell/phone within reach  OT Visit Diagnosis: Unsteadiness on feet (R26.81);Muscle weakness (generalized) (M62.81);Pain Pain - Right/Left: Right Pain - part of body: Knee                Time: 9702-6378 OT Time Calculation (min): 32 min Charges:  OT General Charges $OT Visit: 1 Visit OT Evaluation $OT Eval Moderate Complexity: 1 Mod OT Treatments $Therapeutic Activity: 8-22 mins  Herson Prichard A Knute Mazzuca 04/06/2021, 10:58 AM

## 2021-04-06 NOTE — Progress Notes (Signed)
Subjective: 1 Day Post-Op Procedure(s) (LRB): IRRIGATION AND DEBRIDEMENT KNEE (Right) Patient reports pain as moderate.   Patient seen in rounds for Dr. Linna Caprice. Patient is well, and has had no acute complaints or problems. No acute events overnight. She has been trying to remain still to control pain, but has significant pain with movement of the knee. She reports she has 12 grandchildren at home to help care for, and is frustrated with this set back.   Objective: Vital signs in last 24 hours: Temp:  [97.4 F (36.3 C)-98.8 F (37.1 C)] 98.8 F (37.1 C) (07/31 0458) Pulse Rate:  [72-83] 74 (07/31 0458) Resp:  [16-22] 18 (07/31 0458) BP: (131-163)/(59-87) 139/79 (07/31 0458) SpO2:  [93 %-98 %] 94 % (07/31 0458) Weight:  [80.7 kg] 80.7 kg (07/30 1336)  Intake/Output from previous day:  Intake/Output Summary (Last 24 hours) at 04/06/2021 0808 Last data filed at 04/06/2021 0502 Gross per 24 hour  Intake 1050 ml  Output 2150 ml  Net -1100 ml     Intake/Output this shift: No intake/output data recorded.  Labs: Recent Labs    04/04/21 0359 04/05/21 0322 04/05/21 1802  HGB 12.8 12.1 12.5   Recent Labs    04/05/21 0322 04/05/21 1802  WBC 19.7* 23.4*  RBC 4.23 4.25  HCT 36.4 36.7  PLT 447* 499*   Recent Labs    04/04/21 0359 04/05/21 0322 04/05/21 1802  NA 135 136  --   K 3.4* 3.3*  --   CL 101 98  --   CO2 26 29  --   BUN 11 10  --   CREATININE 0.81 0.82 0.85  GLUCOSE 221* 218*  --   CALCIUM 8.7* 8.6*  --    No results for input(s): LABPT, INR in the last 72 hours.  Exam: General - Patient is Alert and Oriented Extremity - Neurologically intact Sensation intact distally Intact pulses distally Dorsiflexion/Plantar flexion intact Warmth about the knee Dressing - Aquacel dressing C/D/I, Hemovac drain in place with good suction.  Motor Function - intact, moving foot and toes well on exam.   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 Relevant Hx: Course: Daily Update: Today's Plan:discussed with her that her sugars will increase and she has actively worked on her diet and diet and lost from 242 to 188 which I commend her on, she is to continue with her weight loss efforts despite taking this   Electronically signed by: Sandi Mealy    Assessment/Plan: 1 Day Post-Op Procedure(s) (LRB): IRRIGATION AND DEBRIDEMENT KNEE (Right) Principal Problem:   MRSA bacteremia Active Problems:   Class 1 obesity due to excess calories with serious comorbidity and body mass index (BMI) of 30.0 to 30.9 in adult   Essential hypertension   Mixed hyperlipidemia  Coronary artery disease of native artery of native heart with stable angina pectoris (HCC)   SIRS (systemic inflammatory response syndrome) (HCC)   Type 2 diabetes mellitus with hyperglycemia (HCC)   Hyponatremia   Acute pyelonephritis   Epidural abscess   Weakness of both lower extremities   Septic embolism (HCC)   Acute bacterial endocarditis   Cerebral embolism with cerebral infarction   Acute pain of right knee   Rash   Vertebral osteophyte  Estimated body mass index is 28.73 kg/m as calculated from the following:   Height as of this encounter: 5'  6" (1.676 m).   Weight as of this encounter: 80.7 kg.   DVT Prophylaxis - Lovenox Weight bearing as tolerated.  800 cc reported out of hemovac yesterday. Will remain in place.   Intra-op cultures show no organisms so far. Continue abx regimen.   This is a very painful problem, so we will continue to optimize pain control best we can. Ice the knee regularly. Will continue to follow.     Dennie Bible, PA-C Orthopedic Surgery [phone] 04/06/2021, 8:08 AM

## 2021-04-07 ENCOUNTER — Encounter (HOSPITAL_COMMUNITY): Payer: Self-pay | Admitting: Cardiovascular Disease

## 2021-04-07 DIAGNOSIS — I330 Acute and subacute infective endocarditis: Secondary | ICD-10-CM | POA: Diagnosis not present

## 2021-04-07 DIAGNOSIS — E6609 Other obesity due to excess calories: Secondary | ICD-10-CM | POA: Diagnosis not present

## 2021-04-07 DIAGNOSIS — N10 Acute tubulo-interstitial nephritis: Secondary | ICD-10-CM | POA: Diagnosis not present

## 2021-04-07 DIAGNOSIS — R7881 Bacteremia: Secondary | ICD-10-CM | POA: Diagnosis not present

## 2021-04-07 LAB — VANCOMYCIN, TROUGH: Vancomycin Tr: 16 ug/mL (ref 15–20)

## 2021-04-07 LAB — GLUCOSE, CAPILLARY
Glucose-Capillary: 231 mg/dL — ABNORMAL HIGH (ref 70–99)
Glucose-Capillary: 253 mg/dL — ABNORMAL HIGH (ref 70–99)
Glucose-Capillary: 231 mg/dL — ABNORMAL HIGH (ref 70–99)
Glucose-Capillary: 202 mg/dL — ABNORMAL HIGH (ref 70–99)

## 2021-04-07 LAB — VANCOMYCIN, PEAK: Vancomycin Pk: 37 ug/mL (ref 30–40)

## 2021-04-07 MED ORDER — METOPROLOL TARTRATE 50 MG PO TABS
50.0000 mg | ORAL_TABLET | Freq: Two times a day (BID) | ORAL | Status: DC
  Administered 2021-04-07 – 2021-04-10 (×6): 50 mg via ORAL
  Filled 2021-04-07 (×6): qty 1

## 2021-04-07 MED ORDER — INSULIN GLARGINE-YFGN 100 UNIT/ML ~~LOC~~ SOLN
18.0000 [IU] | Freq: Every day | SUBCUTANEOUS | Status: DC
  Administered 2021-04-07 – 2021-04-08 (×2): 18 [IU] via SUBCUTANEOUS
  Filled 2021-04-07 (×3): qty 0.18

## 2021-04-07 MED ORDER — AMLODIPINE BESYLATE 5 MG PO TABS
5.0000 mg | ORAL_TABLET | Freq: Every day | ORAL | Status: DC
  Administered 2021-04-07 – 2021-04-08 (×2): 5 mg via ORAL
  Filled 2021-04-07 (×2): qty 1

## 2021-04-07 MED ORDER — MENTHOL 3 MG MT LOZG: 1.0000 | LOZENGE | OROMUCOSAL | Status: DC | PRN

## 2021-04-07 MED ORDER — OXYCODONE HCL 5 MG PO TABS: 5.0000 mg | ORAL_TABLET | ORAL | 0 refills | Status: DC | PRN

## 2021-04-07 MED ORDER — INSULIN ASPART 100 UNIT/ML IJ SOLN
5.0000 [IU] | Freq: Three times a day (TID) | INTRAMUSCULAR | Status: DC
  Administered 2021-04-07 – 2021-04-08 (×5): 5 [IU] via SUBCUTANEOUS

## 2021-04-07 MED ORDER — ASPIRIN 81 MG PO CHEW: 81.0000 mg | CHEWABLE_TABLET | Freq: Two times a day (BID) | ORAL | 0 refills | Status: DC

## 2021-04-07 MED ORDER — PHENOL 1.4 % MT LIQD: 1.0000 | OROMUCOSAL | Status: DC | PRN

## 2021-04-07 NOTE — Progress Notes (Signed)
[facility]    Date of Admission:  03/30/2021      ID: Katie Serrano is a 65 y.o. female with  Principal Problem:   MRSA bacteremia Active Problems:   Class 1 obesity due to excess calories with serious comorbidity and body mass index (BMI) of 30.0 to 30.9 in adult   Essential hypertension   Mixed hyperlipidemia   Coronary artery disease of native artery of native heart with stable angina pectoris (HCC)   SIRS (systemic inflammatory response syndrome) (HCC)   Type 2 diabetes mellitus with hyperglycemia (HCC)   Hyponatremia   Acute pyelonephritis   Epidural abscess   Weakness of both lower extremities   Septic embolism (HCC)   Acute bacterial endocarditis   Cerebral embolism with cerebral infarction   Acute pain of right knee   Rash   Vertebral osteophyte    Subjective: Has sore throat this morning. No fevers, right knee still sore.   Medications:   amLODipine  5 mg Oral Daily   docusate sodium  100 mg Oral BID   enoxaparin (LOVENOX) injection  40 mg Subcutaneous Q24H   insulin aspart  0-15 Units Subcutaneous TID WC   insulin aspart  0-5 Units Subcutaneous QHS   insulin aspart  5 Units Subcutaneous TID WC   insulin glargine-yfgn  18 Units Subcutaneous Daily   losartan  50 mg Oral QHS   metoprolol tartrate  50 mg Oral BID   omega-3 acid ethyl esters  1 g Oral Daily   polyethylene glycol  17 g Oral Daily   senna  2 tablet Oral BID    Objective: Vital signs in last 24 hours: Temp:  [99 F (37.2 C)-99.2 F (37.3 C)] 99.1 F (37.3 C) (08/01 1235) Pulse Rate:  [73-87] 79 (08/01 1235) Resp:  [16-18] 16 (08/01 1235) BP: (144-154)/(68-76) 151/68 (08/01 1235) SpO2:  [93 %-96 %] 95 % (08/01 1235) .Physical Exam  Constitutional:  oriented to person, place, and time. appears well-developed and well-nourished. No distress.  HENT: [place]/AT, PERRLA, no scleral icterus Mouth/Throat: Oropharynx is clear and moist. No oropharyngeal exudate.  +erythema Cardiovascular: Normal rate, regular rhythm and normal heart sounds. Exam reveals no gallop and no friction rub.  No murmur heard.  Pulmonary/Chest: Effort normal and breath sounds normal. No respiratory distress.  has no wheezes.  Neck = supple, no nuchal rigidity Ext: right knee drain in place. Surgery bandage in place Abdominal: Soft. Bowel sounds are normal.  exhibits no distension. There is no tenderness.  Lymphadenopathy: no cervical adenopathy. No axillary adenopathy Neurological: alert and oriented to person, place, and time.  Skin: Skin is warm and dry. No rash noted. No erythema.  Psychiatric: a normal mood and affect.  behavior is normal.    Lab Results Recent Labs    04/05/21 0322 04/05/21 1802  WBC 19.7* 23.4*  HGB 12.1 12.5  HCT 36.4 36.7  NA 136  --   K 3.3*  --   CL 98  --   CO2 29  --   BUN 10  --   CREATININE 0.82 0.85   No results found for: Maudie Flakes  Microbiology: 7/30 staph aureus - sensi pending Studies/Results: No results found.   Assessment/Plan: Disseminated MRSA infection including right knee septic arthritis s/p wash out = continue on vancomycin. Dosing to 750mg  Q 8hr for the time being Will need picc line placed Plan for 8 wk due to L4-L5 discitis/early osteomyelitis, CNS septic emboli   [facility] Cell: [phone] Pager: [phone]  04/07/2021, 5:47 PM

## 2021-04-07 NOTE — Plan of Care (Signed)
  Problem: Education: Goal: Knowledge of General Education information will improve Description Including pain rating scale, medication(s)/side effects and non-pharmacologic comfort measures Outcome: Progressing   

## 2021-04-07 NOTE — Progress Notes (Signed)
synovial fluid culture-no organism seen on gram, but rare growth staph auerus. lab is working on [employer], possibly will result [employer]

## 2021-04-07 NOTE — Progress Notes (Signed)
PROGRESS NOTE  Katie Serrano  YWV:[id number] DOB: 1955-10-31 DOA: 03/30/2021 PCP: Gordan Payment., MD   Brief Narrative: Katie Serrano is a 65 y.o. female with a history of HTN, asthma, tobacco use, COPD, T2DM, HLD, OA, and GERD who presented to the ED with fever to 105F and malaise found to have sepsis due to MRSA UTI and bacteremia for which vancomycin has been given. TTE was negative 7/27, subsequent TEE negative 7/29. Due to R > L LE weakness, spinal MR was performed and revealed L4-5 facet septic arthritis, several fluiid collections and mass effect on L5 nerve and myositis near L4-5. Neurosurgery was consulted but did not recommend surgical intervention at this time. Brain MRI was obtained 04/03/21 and showed scattered infarcts involving the left caudate head, left parietal lobe, left splenium, and right temporal lobe. It also showed diffuse leptomeningeal enhancement concerning for meningitis. Neurology was consulted. The patient's right knee has developed painful swelling acutely on 7/30 for which arthrocentesis and subsequent arthrotomy and drainage was performed.    Plan currently is to continue IV antibiotics for 6-8 weeks after repeat blood cultures return negative.   Assessment & Plan: Principal Problem:   MRSA bacteremia Active Problems:   Class 1 obesity due to excess calories with serious comorbidity and body mass index (BMI) of 30.0 to 30.9 in adult   Essential hypertension   Mixed hyperlipidemia   Coronary artery disease of native artery of native heart with stable angina pectoris (HCC)   SIRS (systemic inflammatory response syndrome) (HCC)   Type 2 diabetes mellitus with hyperglycemia (HCC)   Hyponatremia   Acute pyelonephritis   Epidural abscess   Weakness of both lower extremities   Septic embolism (HCC)   Acute bacterial endocarditis   Cerebral embolism with cerebral infarction   Acute pain of right knee   Rash   Vertebral osteophyte  Sepsis due to MRSA  pyelonephritis, bacteremia with acute facet arthritis, possible septic arthritis, paraspinal myositis: TTE and subsequent TEE negative for vegetation, negative bubble study.  - Continue monitoring blood cultures from 7/27-7/28. Remains NGTD on 8/1. If remains negative x5 days, can insert PICC and continue 6-8 weeks IV abx.   Right knee septic arthritis: Cultures are growing S. aureus as expected despite no organisms on gram stain.  - Abx as above.  - PT/OT, WBAT, lovenox for DVT ppx, dilaudid as part of multimodal pain control.   Constipation: Severe. Had BM 7/31. - Continue senna 2 tab BID, miralax daily, colace BID, can give suppository prn.   Pustular lesions: Typical of MRSA boils consistent with patient's bacteremia.  - Continue monitoring. Stabilized not consistent with alternative etiologies including AGEP.   B/L Neuropathic and cramping lower extremity pain: MR with concern for septic arthritis/acute facet arthritis, mass-effect of L5 nerve root, and marrow edema in the paraspinous musculature concerning for acute myositis. - Neurosurgery consulted, recommending against surgery/instrumentation. Symptoms improving.   Hyponatremia, suspect hypovolemia: Resolved.   Essential hypertension - Norvasc - Continue losartan 50 mg bedtime - Increase metoprolol 25 mg BID > 50mg  BID. - Continue IV hydralazine as needed    Mixed hyperlipidemia: LDL 44. - Statin stopped due to her allergy and concerns. Continue lovaza   CAD: No angina.  - Continue BB, lovaza, ASA 81mg .    Uncontrolled T2DM with hyperglycemia:  - Tight glycemic control to decrease mortality with bacteremia. Hold metformin.  - Further increase lantus to 18u, continue moderate SSI, increase novolog 3 > 5u TIDWC.   Sore  throat: Most likely sequela of intubation during surgery.  - Chloraseptic prn, lozenge prn.  DVT prophylaxis: Lovenox Code Status: Full Family Communication: Husband at bedside daily. Disposition Plan:   Status is: Inpatient  Remains inpatient appropriate because:Ongoing diagnostic testing needed not appropriate for outpatient work up and Inpatient level of care appropriate due to severity of illness  Dispo: The patient is from: Home              Anticipated d/c is to: Home              Patient currently is not medically stable to d/c.   Difficult to place patient No  Consultants:  Neurosurgery ID Neurology Orthopedic surgery  Procedures:  TEE Right knee bedside arthrocentesis then arthrotomy and drainage, drain placement 04/05/2021 Dr. Linna Caprice.   Antimicrobials: Vancomycin   Subjective: Pain improving in right knee, strength improving in feet/lower legs bilaterally gradually. No fevers. Woke up with a sore throat which has remained stable. No stridor, wheezing, dyspnea.   Objective: Vitals:   04/06/21 2108 04/07/21 0012 04/07/21 0500 04/07/21 1235  BP: (!) 147/70 (!) 144/76 (!) 154/68 (!) 151/68  Pulse: 84 73 87 79  Resp: Temp: 99 F (37.2 C) 99.2 F (37.3 C) 99.1 F (37.3 C) 99.1 F (37.3 C)  TempSrc: Oral Oral Oral Oral  SpO2: 94% 93% 96% 95%  Weight:      Height:        Intake/Output Summary (Last 24 hours) at 04/07/2021 1349 Last data filed at 04/07/2021 1146 Gross per 24 hour  Intake 630 ml  Output 2600 ml  Net -1970 ml   Filed Weights   03/30/21 2345 04/05/21 1336  Weight: 83.9 kg 80.7 kg   Gen: 65 y.o. female in no distress Pulm: Nonlabored breathing room air. Clear. CV: Regular rate and rhythm. No murmur, rub, or gallop. No JVD, no dependent edema. GI: Abdomen soft, protuberant but not distended, +BS. Not tender.  Ext: Warm, no deformities. Right knee dressing without surrounding erythema, active bleeding or purulence. Proximal drain with serosanguinous drainage, scant.  Skin: No other rashes, lesions or ulcers on visualized skin. Pustules stable.  Neuro: Alert and oriented. Improved movement in LE's, sensation intact throughout. No new  focal neurological deficits. Psych: Judgement and insight appear fair. Mood euthymic & affect congruent. Behavior is appropriate.    Data Reviewed: I have personally reviewed following labs and imaging studies  CBC: Recent Labs  Lab 04/02/21 0353 04/03/21 0015 04/04/21 0359 04/05/21 0322 04/05/21 1802  WBC 23.7* 22.8* 18.0* 19.7* 23.4*  HGB 12.5 12.5 12.8 12.1 12.5  HCT 36.6 36.2 37.0 36.4 36.7  MCV 85.3 85.2 84.1 86.1 86.4  PLT 273 350 401* 447* 499*   Basic Metabolic Panel: Recent Labs  Lab 04/01/21 0504 04/02/21 0353 04/03/21 0015 04/04/21 0359 04/05/21 0322 04/05/21 1802  NA 132* 131* 133* 135 136  --   K 3.2* 3.5 3.3* 3.4* 3.3*  --   CL 96* 98 101 101 98  --   CO2 --   GLUCOSE 247* 207* 179* 221* 218*  --   BUN --   CREATININE 0.87 0.87 0.88 0.81 0.82 0.85  CALCIUM 8.6* 8.4* 8.7* 8.7* 8.6*  --   MG 2.3 2.3 2.2 2.0 2.0  --    GFR: Estimated Creatinine Clearance: 70.7 mL/min (by C-G formula based on SCr of 0.85 mg/dL). Liver Function Tests: No  results for input(s): AST, ALT, ALKPHOS, BILITOT, PROT, ALBUMIN in the last 168 hours.  No results for input(s): LIPASE, AMYLASE in the last 168 hours. No results for input(s): AMMONIA in the last 168 hours. Coagulation Profile: No results for input(s): INR, PROTIME in the last 168 hours. Cardiac Enzymes: Recent Labs  Lab 04/01/21 1108  CKTOTAL 62   BNP (last 3 results) No results for input(s): PROBNP in the last 8760 hours. HbA1C: No results for input(s): HGBA1C in the last 72 hours. CBG: Recent Labs  Lab 04/06/21 1113 04/06/21 1627 04/06/21 2105 04/07/21 0611 04/07/21 1103  GLUCAP 231* 283* 231* 231* 253*   Lipid Profile: Recent Labs    04/05/21 1802  CHOL 87  HDL 19*  LDLCALC 44  TRIG 147121  CHOLHDL 4.6   Thyroid Function Tests: No results for input(s): TSH, T4TOTAL, FREET4, T3FREE, THYROIDAB in the last 72 hours. Anemia Panel: No results for input(s):  VITAMINB12, FOLATE, FERRITIN, TIBC, IRON, RETICCTPCT in the last 72 hours. Urine analysis:    Component Value Date/Time   COLORURINE YELLOW 03/31/2021 0239   APPEARANCEUR CLOUDY (A) 03/31/2021 0239   LABSPEC 1.009 03/31/2021 0239   PHURINE 5.0 03/31/2021 0239   GLUCOSEU >=500 (A) 03/31/2021 0239   HGBUR SMALL (A) 03/31/2021 0239   BILIRUBINUR NEGATIVE 03/31/2021 0239   KETONESUR 20 (A) 03/31/2021 0239   PROTEINUR 100 (A) 03/31/2021 0239   NITRITE NEGATIVE 03/31/2021 0239   LEUKOCYTESUR MODERATE (A) 03/31/2021 0239   Recent Results (from the past 240 hour(s))  Resp Panel by RT-PCR (Flu A&B, Covid) Nasopharyngeal Swab     Status: None   Collection Time: 03/31/21 12:21 AM   Specimen: Nasopharyngeal Swab; Nasopharyngeal(NP) swabs in vial transport medium  Result Value Ref Range Status   SARS Coronavirus 2 by RT PCR NEGATIVE NEGATIVE Final    Comment: (NOTE) SARS-CoV-2 target nucleic acids are NOT DETECTED.  The SARS-CoV-2 RNA is generally detectable in upper respiratory specimens during the acute phase of infection. The lowest concentration of SARS-CoV-2 viral copies this assay can detect is 138 copies/mL. A negative result does not preclude SARS-Cov-2 infection and should not be used as the sole basis for treatment or other patient management decisions. A negative result may occur with  improper specimen collection/handling, submission of specimen other than nasopharyngeal swab, presence of viral mutation(s) within the areas targeted by this assay, and inadequate number of viral copies(<138 copies/mL). A negative result must be combined with clinical observations, patient history, and epidemiological information. The expected result is Negative.  Fact Sheet for Patients:  BloggerCourse.comhttps://www.fda.gov/media/152166/download  Fact Sheet for Healthcare Providers:  SeriousBroker.ithttps://www.fda.gov/media/152162/download  This test is no t yet approved or cleared by the [place] FDA and  has been  authorized for detection and/or diagnosis of SARS-CoV-2 by FDA under an Emergency Use Authorization (EUA). This EUA will remain  in effect (meaning this test can be used) for the duration of the COVID-19 declaration under Section 564(b)(1) of the Act, 21 U.S.C.section 360bbb-3(b)(1), unless the authorization is terminated  or revoked sooner.       Influenza A by PCR NEGATIVE NEGATIVE Final   Influenza B by PCR NEGATIVE NEGATIVE Final    Comment: (NOTE) The Xpert Xpress SARS-CoV-2/FLU/RSV plus assay is intended as an aid in the diagnosis of influenza from Nasopharyngeal swab specimens and should not be used as a sole basis for treatment. Nasal washings and aspirates are unacceptable for Xpert Xpress SARS-CoV-2/FLU/RSV testing.  Fact Sheet for Patients: BloggerCourse.comhttps://www.fda.gov/media/152166/download  Fact Sheet for  Healthcare Providers: SeriousBroker.it  This test is not yet approved or cleared by the [place] and has been authorized for detection and/or diagnosis of SARS-CoV-2 by FDA under an Emergency Use Authorization (EUA). This EUA will remain in effect (meaning this test can be used) for the duration of the COVID-19 declaration under Section 564(b)(1) of the Act, 21 U.S.C. section 360bbb-3(b)(1), unless the authorization is terminated or revoked.  Performed at [facility], [address], [place], [place] [address]   Urine Culture     Status: Abnormal   Collection Time: 03/31/21  5:51 AM   Specimen: Urine, Clean Catch  Result Value Ref Range Status   Specimen Description URINE, CLEAN CATCH  Final   Special Requests NONE  Final   Culture (A)  Final    >=100,000 COLONIES/mL METHICILLIN RESISTANT STAPHYLOCOCCUS AUREUS 40,000 COLONIES/mL AEROCOCCUS SPECIES Standardized susceptibility testing for this organism is not available. Performed at [facility], [address], [place], [place] [address]    Report Status 04/02/2021 FINAL   Final   Organism ID, Bacteria METHICILLIN RESISTANT STAPHYLOCOCCUS AUREUS (A)  Final      Susceptibility   Methicillin resistant staphylococcus aureus - MIC*    CIPROFLOXACIN >=8 RESISTANT Resistant     GENTAMICIN <=0.5 SENSITIVE Sensitive     NITROFURANTOIN <=16 SENSITIVE Sensitive     OXACILLIN >=4 RESISTANT Resistant     TETRACYCLINE <=1 SENSITIVE Sensitive     VANCOMYCIN 1 SENSITIVE Sensitive     TRIMETH/SULFA <=10 SENSITIVE Sensitive     CLINDAMYCIN <=0.25 SENSITIVE Sensitive     RIFAMPIN <=0.5 SENSITIVE Sensitive     Inducible Clindamycin NEGATIVE Sensitive     * >=100,000 COLONIES/mL METHICILLIN RESISTANT STAPHYLOCOCCUS AUREUS  Culture, blood (Routine X 2) w Reflex to ID Panel     Status: Abnormal   Collection Time: 04/01/21 11:08 AM   Specimen: BLOOD  Result Value Ref Range Status   Specimen Description BLOOD LEFT ANTECUBITAL  Final   Special Requests   Final    BOTTLES DRAWN AEROBIC AND ANAEROBIC Blood Culture adequate volume   Culture  Setup Time   Final    GRAM POSITIVE COCCI IN CLUSTERS IN BOTH AEROBIC AND ANAEROBIC BOTTLES Organism ID to follow CRITICAL RESULT CALLED TO, READ BACK BY AND VERIFIED WITH: PARMD MIRANDA BRYK BY MESSAN H. AT 0552 02 7 27 2022 Performed at [facility], [address] [place], [place] [address]    Culture METHICILLIN RESISTANT STAPHYLOCOCCUS AUREUS (A)  Final   Report Status 04/04/2021 FINAL  Final   Organism ID, Bacteria METHICILLIN RESISTANT STAPHYLOCOCCUS AUREUS  Final      Susceptibility   Methicillin resistant staphylococcus aureus - MIC*    CIPROFLOXACIN >=8 RESISTANT Resistant     ERYTHROMYCIN >=8 RESISTANT Resistant     GENTAMICIN <=0.5 SENSITIVE Sensitive     OXACILLIN >=4 RESISTANT Resistant     TETRACYCLINE <=1 SENSITIVE Sensitive     VANCOMYCIN 1 SENSITIVE Sensitive     TRIMETH/SULFA <=10 SENSITIVE Sensitive     CLINDAMYCIN <=0.25 SENSITIVE Sensitive     RIFAMPIN <=0.5 SENSITIVE Sensitive     Inducible Clindamycin  NEGATIVE Sensitive     * METHICILLIN RESISTANT STAPHYLOCOCCUS AUREUS  Blood Culture ID Panel (Reflexed)     Status: Abnormal   Collection Time: 04/01/21 11:08 AM  Result Value Ref Range Status   Enterococcus faecalis NOT DETECTED NOT DETECTED Final   Enterococcus Faecium NOT DETECTED NOT DETECTED Final  Listeria monocytogenes NOT DETECTED NOT DETECTED Final   Staphylococcus species DETECTED (A) NOT DETECTED Final    Comment: CRITICAL RESULT CALLED TO, READ BACK BY AND VERIFIED WITH: PARMD MIRANDA BRYK BY MESSAN H. AT 0552 02 7 27 2022    Staphylococcus aureus (BCID) DETECTED (A) NOT DETECTED Final    Comment: Methicillin (oxacillin)-resistant Staphylococcus aureus (MRSA). MRSA is predictably resistant to beta-lactam antibiotics (except ceftaroline). Preferred therapy is vancomycin unless clinically contraindicated. Patient requires contact precautions if  hospitalized. CRITICAL RESULT CALLED TO, READ BACK BY AND VERIFIED WITH: PARMD MIRANDA BRYK BY MESSAN H. AT 0552 02 7 27 2022    Staphylococcus epidermidis NOT DETECTED NOT DETECTED Final   Staphylococcus lugdunensis NOT DETECTED NOT DETECTED Final   Streptococcus species NOT DETECTED NOT DETECTED Final   Streptococcus agalactiae NOT DETECTED NOT DETECTED Final   Streptococcus pneumoniae NOT DETECTED NOT DETECTED Final   Streptococcus pyogenes NOT DETECTED NOT DETECTED Final   A.calcoaceticus-baumannii NOT DETECTED NOT DETECTED Final   Bacteroides fragilis NOT DETECTED NOT DETECTED Final   Enterobacterales NOT DETECTED NOT DETECTED Final   Enterobacter cloacae complex NOT DETECTED NOT DETECTED Final   Escherichia coli NOT DETECTED NOT DETECTED Final   Klebsiella aerogenes NOT DETECTED NOT DETECTED Final   Klebsiella oxytoca NOT DETECTED NOT DETECTED Final   Klebsiella pneumoniae NOT DETECTED NOT DETECTED Final   Proteus species NOT DETECTED NOT DETECTED Final   Salmonella species NOT DETECTED NOT DETECTED Final   Serratia  marcescens NOT DETECTED NOT DETECTED Final   Haemophilus influenzae NOT DETECTED NOT DETECTED Final   Neisseria meningitidis NOT DETECTED NOT DETECTED Final   Pseudomonas aeruginosa NOT DETECTED NOT DETECTED Final   Stenotrophomonas maltophilia NOT DETECTED NOT DETECTED Final   Candida albicans NOT DETECTED NOT DETECTED Final   Candida auris NOT DETECTED NOT DETECTED Final   Candida glabrata NOT DETECTED NOT DETECTED Final   Candida krusei NOT DETECTED NOT DETECTED Final   Candida parapsilosis NOT DETECTED NOT DETECTED Final   Candida tropicalis NOT DETECTED NOT DETECTED Final   Cryptococcus neoformans/gattii NOT DETECTED NOT DETECTED Final   Meth resistant mecA/C and MREJ DETECTED (A) NOT DETECTED Final    Comment: CRITICAL RESULT CALLED TO, READ BACK BY AND VERIFIED WITH: PARMD MIRANDA BRYK BY MESSAN H. AT 0552 02 7 27 2022 Performed at [facility], [address] [place], [place] [address]   Culture, blood (Routine X 2) w Reflex to ID Panel     Status: Abnormal   Collection Time: 04/01/21 11:18 AM   Specimen: BLOOD RIGHT HAND  Result Value Ref Range Status   Specimen Description BLOOD RIGHT HAND  Final   Special Requests   Final    BOTTLES DRAWN AEROBIC ONLY Blood Culture adequate volume   Culture  Setup Time   Final    GRAM POSITIVE COCCI IN CLUSTERS AEROBIC BOTTLE ONLY CRITICAL VALUE NOTED.  VALUE IS CONSISTENT WITH PREVIOUSLY REPORTED AND CALLED VALUE.    Culture (A)  Final    STAPHYLOCOCCUS AUREUS SUSCEPTIBILITIES PERFORMED ON PREVIOUS CULTURE WITHIN THE LAST 5 DAYS. Performed at [facility], [address], [place], [place] [address]    Report Status 04/04/2021 FINAL  Final  Culture, blood (Routine X 2) w Reflex to ID Panel     Status: None (Preliminary result)   Collection Time: 04/02/21 11:50 PM   Specimen: BLOOD RIGHT HAND  Result Value Ref Range Status   Specimen Description BLOOD RIGHT HAND  Final  Special Requests   Final    BOTTLES DRAWN AEROBIC  ONLY Blood Culture adequate volume   Culture   Final    NO GROWTH 4 DAYS Performed at [facility], [address] [place], [place] [address]    Report Status PENDING  Incomplete  Culture, blood (Routine X 2) w Reflex to ID Panel     Status: None (Preliminary result)   Collection Time: 04/03/21 12:09 AM   Specimen: BLOOD LEFT HAND  Result Value Ref Range Status   Specimen Description BLOOD LEFT HAND  Final   Special Requests   Final    BOTTLES DRAWN AEROBIC ONLY Blood Culture adequate volume   Culture   Final    NO GROWTH 4 DAYS Performed at [facility], [address] [place], [place] [address]    Report Status PENDING  Incomplete  Body fluid culture w Gram Stain     Status: None (Preliminary result)   Collection Time: 04/05/21 10:52 AM   Specimen: Synovium; Synovial Fluid  Result Value Ref Range Status   Specimen Description SYNOVIAL FLUID  Final   Special Requests RIGHT KNEE  Final   Gram Stain   Final    ABUNDANT WBC PRESENT, PREDOMINANTLY PMN NO ORGANISMS SEEN Gram Stain Report Called to,Read Back By and Verified With: SWINTEK MD @1425  04/05/21 EB      Culture   Final    RARE STAPHYLOCOCCUS AUREUS SUSCEPTIBILITIES TO FOLLOW CRITICAL RESULT CALLED TO, READ BACK BY AND VERIFIED WITH: RN H.MCINTOSH AT 1024 ON 04/07/2021 BY T.SAAD. Performed at [facility], [address] [place], [place]    Report Status PENDING  Incomplete  Aerobic/Anaerobic Culture w Gram Stain (surgical/deep wound)     Status: None (Preliminary result)   Collection Time: 04/05/21  4:07 PM   Specimen: Tissue  Result Value Ref Range Status   Specimen Description TISSUE  Final   Special Requests RIGHT KNEE SYNOVIUM SPEC A  Final   Gram Stain   Final    RARE WBC PRESENT, PREDOMINANTLY MONONUCLEAR NO ORGANISMS SEEN    Culture   Final    RARE STAPHYLOCOCCUS AUREUS SUSCEPTIBILITIES TO FOLLOW Performed at [facility], [address], [place], [place]     Report Status PENDING  Incomplete      Radiology Studies: No results found.  Scheduled Meds:  amLODipine  5 mg Oral Daily   docusate sodium  100 mg Oral BID   enoxaparin (LOVENOX) injection  40 mg Subcutaneous Q24H   insulin aspart  0-15 Units Subcutaneous TID WC   insulin aspart  0-5 Units Subcutaneous QHS   insulin aspart  5 Units Subcutaneous TID WC   insulin glargine-yfgn  18 Units Subcutaneous Daily   losartan  50 mg Oral QHS   metoprolol tartrate  25 mg Oral BID   omega-3 acid ethyl esters  1 g Oral Daily   polyethylene glycol  17 g Oral Daily   senna  2 tablet Oral BID   Continuous Infusions:  vancomycin 750 mg (04/07/21 0742)     LOS: 6 days   Time spent: 35 minutes.  06/07/21, MD [facility] [email or web address] 04/07/2021, 1:49 PM

## 2021-04-07 NOTE — Progress Notes (Signed)
Subjective: 2 Days Post-Op Procedure(s) (LRB): IRRIGATION AND DEBRIDEMENT KNEE (Right) Patient reports pain as moderate.   Patient seen in rounds for Dr. Linna Caprice. Patient is well, and has had no acute complaints or problems. No acute events overnight. Moderate pain with movement of the knee.   Objective: Vital signs in last 24 hours: Temp:  [99 F (37.2 C)-99.2 F (37.3 C)] 99.1 F (37.3 C) (08/01 0500) Pulse Rate:  [67-87] 87 (08/01 0500) Resp:  [18-19] 18 (08/01 0500) BP: (142-154)/(68-82) 154/68 (08/01 0500) SpO2:  [93 %-96 %] 96 % (08/01 0500)  Intake/Output from previous day:  Intake/Output Summary (Last 24 hours) at 04/07/2021 0827 Last data filed at 04/07/2021 0610 Gross per 24 hour  Intake 240 ml  Output 2450 ml  Net -2210 ml      Intake/Output this shift: No intake/output data recorded.  Labs: Recent Labs    04/05/21 0322 04/05/21 1802  HGB 12.1 12.5    Recent Labs    04/05/21 0322 04/05/21 1802  WBC 19.7* 23.4*  RBC 4.23 4.25  HCT 36.4 36.7  PLT 447* 499*    Recent Labs    04/05/21 0322 04/05/21 1802  NA 136  --   K 3.3*  --   CL 98  --   CO2 29  --   BUN 10  --   CREATININE 0.82 0.85  GLUCOSE 218*  --   CALCIUM 8.6*  --     No results for input(s): LABPT, INR in the last 72 hours.  Exam: General - Patient is Alert and Oriented Extremity - Neurologically intact Sensation intact distally Intact pulses distally Dorsiflexion/Plantar flexion intact Warmth about the knee Dressing - Aquacel dressing C/D/I, Hemovac drain in place with good suction.  Motor Function - intact, moving foot and toes well on exam.   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 Relevant Hx: Course: Daily Update: Today's Plan:discussed with her that her sugars will increase and she has actively worked on her diet and diet and lost from 242 to 188 which I commend her on, she is to continue with her weight loss efforts despite taking this   Electronically signed by: Sandi Mealy    Assessment/Plan: 2 Days Post-Op Procedure(s) (LRB): IRRIGATION AND DEBRIDEMENT KNEE (Right) Principal Problem:   MRSA bacteremia Active Problems:   Class 1 obesity due to excess calories with serious comorbidity and body mass index (BMI) of 30.0 to 30.9 in adult   Essential hypertension   Mixed hyperlipidemia   Coronary artery disease of native artery of native heart with stable angina pectoris (HCC)   SIRS (systemic inflammatory response syndrome) (HCC)   Type 2 diabetes mellitus with hyperglycemia (HCC)   Hyponatremia   Acute pyelonephritis   Epidural abscess   Weakness of  both lower extremities   Septic embolism (HCC)   Acute bacterial endocarditis   Cerebral embolism with cerebral infarction   Acute pain of right knee   Rash   Vertebral osteophyte  Estimated body mass index is 28.73 kg/m as calculated from the following:   Height as of this encounter: 5\' 6"  (1.676 m).   Weight as of this encounter: 80.7 kg.   DVT Prophylaxis - Lovenox Weight bearing as tolerated.  Remove hemovac once output below 30cc per shift.   Intra-op cultures show few staph aureus. Continue abx regimen.        PA-C 04/07/2021, 8:27 AM

## 2021-04-07 NOTE — Progress Notes (Signed)
Pharmacy Antibiotic Note  Katie Serrano is a 65 y.o. female admitted on 03/30/2021 with MRSA bacteremia.  Pharmacy has been consulted for vancomycin dosing.  Vancomycin peak (37) today was drawn about an hour early. Vancomycin trough is 16, drawn appropriately about 7 hours after the last dose. AUC predicted to be around 584. Last SCr on 7/30 was within normal limits. AUC is therapeutic and trough at goal range above 15 with CNS involvement.   Plan: Continue Vancomycin 750 mg every 8 hours  Monitor renal function Repeat levels at least weekly   Height: 5\' 6"  (167.6 cm) Weight: 80.7 kg (178 lb) IBW/kg (Calculated) : 59.3  Temp (24hrs), Avg:99.1 F (37.3 C), Min:99 F (37.2 C), Max:99.2 F (37.3 C)  Recent Labs  Lab 04/02/21 0353 04/03/21 0015 04/04/21 0359 04/05/21 0322 04/05/21 1802 04/05/21 2159 04/07/21 0858 04/07/21 1441  WBC 23.7* 22.8* 18.0* 19.7* 23.4*  --   --   --   CREATININE 0.87 0.88 0.81 0.82 0.85  --   --   --   VANCOTROUGH  --   --   --   --   --   --   --  16  VANCOPEAK  --   --   --   --  20*  --  37  --   VANCORANDOM  --   --   --   --   --  15  --   --      Estimated Creatinine Clearance: 70.7 mL/min (by C-G formula based on SCr of 0.85 mg/dL).    No Known Allergies   06/07/21, PharmD, BCPS, BCIDP Infectious Diseases Clinical Pharmacist Phone: [phone]  04/07/2021 3:53 PM   [facility] pharmacy phone numbers are listed on [email or web address]

## 2021-04-07 NOTE — Care Management Important Message (Signed)
Important Message  Patient Details  Name: Katie Serrano MRN: [id number] Date of Birth: 1956-08-25   Medicare Important Message Given:  Yes     Latyra Jaye Stefan Church 04/07/2021, 4:40 PM

## 2021-04-08 ENCOUNTER — Inpatient Hospital Stay: Payer: Self-pay

## 2021-04-08 DIAGNOSIS — R7881 Bacteremia: Secondary | ICD-10-CM | POA: Diagnosis not present

## 2021-04-08 DIAGNOSIS — E6609 Other obesity due to excess calories: Secondary | ICD-10-CM | POA: Diagnosis not present

## 2021-04-08 DIAGNOSIS — N10 Acute tubulo-interstitial nephritis: Secondary | ICD-10-CM | POA: Diagnosis not present

## 2021-04-08 DIAGNOSIS — I330 Acute and subacute infective endocarditis: Secondary | ICD-10-CM | POA: Diagnosis not present

## 2021-04-08 LAB — BASIC METABOLIC PANEL
Anion gap: 7 (ref 5–15)
BUN: 5 mg/dL — ABNORMAL LOW (ref 8–23)
CO2: 30 mmol/L (ref 22–32)
Calcium: 8.4 mg/dL — ABNORMAL LOW (ref 8.9–10.3)
Chloride: 97 mmol/L — ABNORMAL LOW (ref 98–111)
Creatinine, Ser: 0.72 mg/dL (ref 0.44–1.00)
GFR, Estimated: >60 mL/min (ref >60)
Glucose, Bld: 217 mg/dL — ABNORMAL HIGH (ref 70–99)
Potassium: 3.7 mmol/L (ref 3.5–5.1)
Sodium: 134 mmol/L — ABNORMAL LOW (ref 135–145)

## 2021-04-08 LAB — CULTURE, BLOOD (ROUTINE X 2)
Culture: NO GROWTH
Culture: NO GROWTH
Special Requests: ADEQUATE
Special Requests: ADEQUATE

## 2021-04-08 LAB — CBC
HCT: 35.6 % — ABNORMAL LOW (ref 36.0–46.0)
Hemoglobin: 11.9 g/dL — ABNORMAL LOW (ref 12.0–15.0)
MCH: 28.7 pg (ref 26.0–34.0)
MCHC: 33.4 g/dL (ref 30.0–36.0)
MCV: 86 fL (ref 80.0–100.0)
Platelets: 493 10*3/uL — ABNORMAL HIGH (ref 150–400)
RBC: 4.14 MIL/uL (ref 3.87–5.11)
RDW: 14 % (ref 11.5–15.5)
WBC: 18.3 10*3/uL — ABNORMAL HIGH (ref 4.0–10.5)
nRBC: 0 % (ref 0.0–0.2)

## 2021-04-08 LAB — BODY FLUID CULTURE W GRAM STAIN

## 2021-04-08 LAB — GLUCOSE, CAPILLARY
Glucose-Capillary: 225 mg/dL — ABNORMAL HIGH (ref 70–99)
Glucose-Capillary: 204 mg/dL — ABNORMAL HIGH (ref 70–99)
Glucose-Capillary: 169 mg/dL — ABNORMAL HIGH (ref 70–99)
Glucose-Capillary: 117 mg/dL — ABNORMAL HIGH (ref 70–99)

## 2021-04-08 MED ORDER — CHLORHEXIDINE GLUCONATE CLOTH 2 % EX PADS
6.0000 | MEDICATED_PAD | Freq: Every day | CUTANEOUS | Status: DC
  Administered 2021-04-08 – 2021-04-10 (×2): 6 via TOPICAL

## 2021-04-08 MED ORDER — BISACODYL 10 MG RE SUPP
10.0000 mg | Freq: Once | RECTAL | Status: AC
  Administered 2021-04-08: 10 mg via RECTAL
  Filled 2021-04-08: qty 1

## 2021-04-08 MED ORDER — SODIUM CHLORIDE 0.9% FLUSH: 10.0000 mL | INTRAVENOUS | Status: DC | PRN

## 2021-04-08 MED ORDER — AMLODIPINE BESYLATE 10 MG PO TABS
10.0000 mg | ORAL_TABLET | Freq: Every day | ORAL | Status: DC
  Administered 2021-04-09 – 2021-04-10 (×2): 10 mg via ORAL
  Filled 2021-04-08 (×2): qty 1

## 2021-04-08 MED ORDER — IPRATROPIUM BROMIDE 0.03 % NA SOLN
2.0000 | Freq: Two times a day (BID) | NASAL | Status: DC
  Filled 2021-04-08: qty 30

## 2021-04-08 MED ORDER — INSULIN GLARGINE-YFGN 100 UNIT/ML ~~LOC~~ SOLN
20.0000 [IU] | Freq: Every day | SUBCUTANEOUS | Status: DC
  Administered 2021-04-09 – 2021-04-10 (×2): 20 [IU] via SUBCUTANEOUS
  Filled 2021-04-08 (×2): qty 0.2

## 2021-04-08 MED ORDER — IPRATROPIUM BROMIDE 0.06 % NA SOLN
1.0000 | Freq: Two times a day (BID) | NASAL | Status: DC
  Administered 2021-04-09 – 2021-04-10 (×3): 1 via NASAL
  Filled 2021-04-08: qty 15

## 2021-04-08 MED ORDER — INSULIN ASPART 100 UNIT/ML IJ SOLN
7.0000 [IU] | Freq: Three times a day (TID) | INTRAMUSCULAR | Status: DC
  Administered 2021-04-08 – 2021-04-10 (×5): 7 [IU] via SUBCUTANEOUS

## 2021-04-08 NOTE — Progress Notes (Signed)
Physical Therapy Treatment Patient Details Name: ABCDE ONEIL MRN: [id number] DOB: 1955/10/05 Today's Date: 04/08/2021    History of Present Illness 65 yo female presenting to ED with fatigue and a fever on 7/25.  Also, reporting of left-sided flank pain. Admitted with UTI and hyponatremia. 04/03/21 MRI lumbar spine-joint effusion left L4-5 facet (arthritis vs septic arthritis); mass effect on left L5 nerve root; edema left>right lower paraspinals suggest myositis; small disc protrusions L3-4 and L4-5 potentially effecting the left L3 and L4 nerve roots. Pt developed incr pain and edema in rt knee 7/29-7/30 and underwent open arthotomy and debridement of rt knee for septic arthritis  on 7/30.  PMH including obesity, Eagle syndrome, elevated CRP, HTN, GERD, asthma, tobacco use, COPD, DM2, HLD, and osteoarthritis.    PT Comments    Patient received in bed, RN present in room as well as husband. Patient is agreeable to PT. Continues to have significant pain in right knee with movement. Limited in knee flexion tolerance. Requires support to right LE with any movement.  She is able to move to edge of bed with mod assist. Performed sit to stand transfer with min assist and support to right LE. Only able to stand briefly. Patient is motivated to improve mobility and works hard during session. She will continue to benefit from skilled PT while here to improve functional mobility, focusing on transfers at this time.    Follow Up Recommendations  CIR     Equipment Recommendations  Rolling walker with 5" wheels    Recommendations for Other Services Rehab consult     Precautions / Restrictions Precautions Precaution Comments: drain to R knee, mod fall Restrictions Weight Bearing Restrictions: Yes RLE Weight Bearing: Weight bearing as tolerated    Mobility  Bed Mobility Overal bed mobility: Needs Assistance Bed Mobility: Supine to Sit;Sit to Supine     Supine to sit: Mod assist;HOB  elevated Sit to supine: Mod assist   General bed mobility comments: Patient requires assist from husband to manage R LE when moving in bed due to pain. Has good ability to use UEs to move in bed when cued. Patient able to bring R LE close to floor with support from sheet under it.    Transfers Overall transfer level: Needs assistance Equipment used: Rolling walker (2 wheeled) Transfers: Sit to/from Stand Sit to Stand: Min assist;From elevated surface         General transfer comment: Patient able to stand briefly at edge of bed with min assist. R LE kept off floor due to pain.  Ambulation/Gait             General Gait Details: Unable due to pain   Stairs             Wheelchair Mobility    Modified Rankin (Stroke Patients Only)       Balance Overall balance assessment: Needs assistance Sitting-balance support: Feet supported;Bilateral upper extremity supported Sitting balance-Leahy Scale: Good Sitting balance - Comments: able to balance sitting at edge of bed with support to right LE   Standing balance support: Bilateral upper extremity supported Standing balance-Leahy Scale: Fair Standing balance comment: able to stand briefly with min guard assist and RW                            Cognition Arousal/Alertness: Awake/alert Behavior During Therapy: WFL for tasks assessed/performed Overall Cognitive Status: Within Functional Limits for tasks assessed  Following Commands: Follows one step commands consistently     Problem Solving: Requires verbal cues;Requires tactile cues        Exercises Other Exercises Other Exercises: educated patient to perform AP and move R LE as able in the bed.    General Comments        Pertinent Vitals/Pain Pain Assessment: Faces Faces Pain Scale: Hurts whole lot Pain Location: rt knee with any movement Pain Descriptors / Indicators:  Guarding;Grimacing;Stabbing;Discomfort;Sore Pain Intervention(s): Monitored during session;Limited activity within patient's tolerance;Premedicated before session;Repositioned    Home Living                      Prior Function            PT Goals (current goals can now be found in the care plan section) Acute Rehab PT Goals Patient Stated Goal: get back to PLOF PT Goal Formulation: With patient Time For Goal Achievement: 04/20/21 Potential to Achieve Goals: Good Progress towards PT goals: Progressing toward goals    Frequency    Min 3X/week      PT Plan Discharge plan needs to be updated    Co-evaluation              AM-PAC PT "6 Clicks" Mobility   Outcome Measure  Help needed turning from your back to your side while in a flat bed without using bedrails?: A Little Help needed moving from lying on your back to sitting on the side of a flat bed without using bedrails?: A Lot Help needed moving to and from a bed to a chair (including a wheelchair)?: Total Help needed standing up from a chair using your arms (e.g., wheelchair or bedside chair)?: A Little Help needed to walk in hospital room?: Total Help needed climbing 3-5 steps with a railing? : Total 6 Click Score: 11    End of Session Equipment Utilized During Treatment: Gait belt Activity Tolerance: Patient limited by pain Patient left: in bed;with call bell/phone within reach;with family/visitor present;with bed alarm set Nurse Communication: Mobility status PT Visit Diagnosis: Other abnormalities of gait and mobility (R26.89);Pain;Difficulty in walking, not elsewhere classified (R26.2) Pain - Right/Left: Right Pain - part of body: Knee     Time: 5277-8242 PT Time Calculation (min) (ACUTE ONLY): 37 min  Charges:  $Therapeutic Activity: 23-37 mins                    Marlina Cataldi, PT, GCS 04/08/21,10:46 AM

## 2021-04-08 NOTE — Anesthesia Postprocedure Evaluation (Signed)
Anesthesia Post Note  Patient: Katie Serrano  Procedure(s) Performed: IRRIGATION AND DEBRIDEMENT KNEE (Right)     Patient location during evaluation: PACU Anesthesia Type: General Level of consciousness: awake and alert Pain management: pain level controlled Vital Signs Assessment: post-procedure vital signs reviewed and stable Respiratory status: spontaneous breathing, nonlabored ventilation, respiratory function stable and patient connected to nasal cannula oxygen Cardiovascular status: blood pressure returned to baseline and stable Postop Assessment: no apparent nausea or vomiting Anesthetic complications: no   No notable events documented.  Last Vitals:  Vitals:   04/08/21 0932 04/08/21 1200  BP: (!) 147/73 (!) 142/73  Pulse: 75 67  Resp:  18  Temp:  36.9 C  SpO2:  98%    Last Pain:  Vitals:   04/08/21 1200  TempSrc: Oral  PainSc:                  Viviano Bir

## 2021-04-08 NOTE — Progress Notes (Signed)
Peripherally Inserted Central Catheter Placement  The IV Nurse has discussed with the patient and/or persons authorized to consent for the patient, the purpose of this procedure and the potential benefits and risks involved with this procedure.  The benefits include less needle sticks, lab draws from the catheter, and the patient may be discharged home with the catheter. Risks include, but not limited to, infection, bleeding, blood clot (thrombus formation), and puncture of an artery; nerve damage and irregular heartbeat and possibility to perform a PICC exchange if needed/ordered by physician.  Alternatives to this procedure were also discussed.  Bard Power PICC patient education guide, fact sheet on infection prevention and patient information card has been provided to patient /or left at bedside.    PICC Placement Documentation  PICC Single Lumen 04/08/21 Right Brachial 39 cm 0 cm (Active)  Indication for Insertion or Continuance of Line Prolonged intravenous therapies;Home intravenous therapies (PICC only) 04/08/21 1514  Exposed Catheter (cm) 0 cm 04/08/21 1514  Site Assessment Clean;Dry;Intact 04/08/21 1514  Line Status Flushed;Saline locked;Blood return noted 04/08/21 1514  Dressing Type Transparent 04/08/21 1514  Dressing Status Clean;Dry;Intact 04/08/21 1514  Antimicrobial disc in place? Yes 04/08/21 1514  Safety Lock Not Applicable 04/08/21 1514  Line Care Connections checked and tightened 04/08/21 1514  Line Adjustment (NICU/IV Team Only) No 04/08/21 1514  Dressing Intervention New dressing 04/08/21 1514  Dressing Change Due 04/15/21 04/08/21 1514       Scarlet Abad, Lajean Manes 04/08/2021, 3:16 PM

## 2021-04-08 NOTE — Progress Notes (Signed)
PROGRESS NOTE  Katie Serrano  ZOX:[id number] DOB: August 22, 1956 DOA: 03/30/2021 PCP: Gordan Payment., MD   Brief Narrative: Katie Serrano is a 65 y.o. female with a history of HTN, asthma, tobacco use, COPD, T2DM, HLD, OA, and GERD who presented to the ED with fever to 105F and malaise found to have sepsis due to MRSA UTI and bacteremia for which vancomycin has been given. TTE was negative 7/27, subsequent TEE negative 7/29. Due to R > L LE weakness, spinal MR was performed and revealed L4-5 facet septic arthritis, several fluiid collections and mass effect on L5 nerve and myositis near L4-5. Neurosurgery was consulted but did not recommend surgical intervention at this time. Brain MRI was obtained 04/03/21 and showed scattered infarcts involving the left caudate head, left parietal lobe, left splenium, and right temporal lobe. It also showed diffuse leptomeningeal enhancement concerning for meningitis. Neurology was consulted. The patient's right knee has developed painful swelling acutely on 7/30 for which arthrocentesis and subsequent arthrotomy and drainage was performed.    Plan currently is to continue IV antibiotics for 6-8 weeks after repeat blood cultures return negative.   Assessment & Plan: Principal Problem:   MRSA bacteremia Active Problems:   Class 1 obesity due to excess calories with serious comorbidity and body mass index (BMI) of 30.0 to 30.9 in adult   Essential hypertension   Mixed hyperlipidemia   Coronary artery disease of native artery of native heart with stable angina pectoris (HCC)   SIRS (systemic inflammatory response syndrome) (HCC)   Type 2 diabetes mellitus with hyperglycemia (HCC)   Hyponatremia   Acute pyelonephritis   Epidural abscess   Weakness of both lower extremities   Septic embolism (HCC)   Acute bacterial endocarditis   Cerebral embolism with cerebral infarction   Acute pain of right knee   Rash   Vertebral osteophyte  Sepsis due to MRSA  pyelonephritis, bacteremia with acute facet arthritis, septic arthritis, paraspinal myositis: TTE and subsequent TEE negative for vegetation, negative bubble study.  - Repeat blood cultures 7/27-7/28 are negative at 5 days. Insert PICC and plan 8 weeks vancomycin per ID.    Right knee septic arthritis: Cultures are growing MRSA as expected despite no organisms on gram stain.  - Abx as above.  - PT/OT, WBAT. Will consult CIR per PT. Pt very motivated with good support at home.  - Continue lovenox for DVT ppx - Continue oxycodone prn pain - Knee immobilizer ordered - Still has drain. Will defer management to orthopedics.   Constipation: Severe. Had BM 7/31. - Continue senna 2 tab BID, miralax daily, colace BID, will order suppository today and recommend enema prn.   Pustular lesions: Typical of MRSA boils consistent with patient's bacteremia. Improved.  B/L Neuropathic and cramping lower extremity pain: MR with concern for septic arthritis/acute facet arthritis, mass-effect of L5 nerve root, and marrow edema in the paraspinous musculature concerning for acute myositis. - Neurosurgery consulted, recommending against surgery/instrumentation. Symptoms improving.   Hyponatremia, suspect hypovolemia: Resolved.   Essential hypertension - Increase norvasc to  - Continue losartan 50 mg bedtime - Increased metoprolol 25 mg BID >  BID. - Continue IV hydralazine as needed    Mixed hyperlipidemia: LDL 44. - Statin stopped due to her allergy and concerns. Continue lovaza   CAD: No angina.  - Continue BB, lovaza, ASA .    Uncontrolled T2DM with hyperglycemia:  - Tight glycemic control to decrease mortality with bacteremia. Hold metformin.  - Further increase  lantus to 20u, continue moderate SSI, increase novolog to [facility].   Sore throat: Most likely sequela of intubation during surgery. Also possibly PND component. - Add IN atrovent - Chloraseptic prn, lozenge prn.  DVT  prophylaxis: Lovenox Code Status: Full Family Communication: Husband at bedside daily. Disposition Plan:  Status is: Inpatient  Remains inpatient appropriate because:Ongoing diagnostic testing needed not appropriate for outpatient work up and Inpatient level of care appropriate due to severity of illness  Dispo: The patient is from: Home              Anticipated d/c is to: Home vs. CIR              Patient currently is not medically stable to d/c.   Difficult to place patient No  Consultants:  Neurosurgery ID Neurology Orthopedic surgery PM&R  Procedures:  TEE Right knee bedside arthrocentesis then arthrotomy and drainage, drain placement 04/05/2021 Dr. Linna Caprice.   Antimicrobials: Vancomycin   Subjective: Mobility starting to improve as pain control is better. No fevers, still having AM scratchy sore throat that is much improved later in the day. Movement in legs much better. Rash is subsiding. Eager to rehabilitate as much as possible. PT will see her again tomorrow.   Objective: Vitals:   04/08/21 0126 04/08/21 0600 04/08/21 0932 04/08/21 1200  BP: 139/72 138/77 (!) 147/73 (!) 142/73  Pulse: 67 69 75 67  Resp: 17 18  18   Temp: 98.6 F (37 C) 98.6 F (37 C)  98.5 F (36.9 C)  TempSrc: Oral Oral  Oral  SpO2: 94% 94%  98%  Weight:      Height:        Intake/Output Summary (Last 24 hours) at 04/08/2021 1204 Last data filed at 04/08/2021 0601 Gross per 24 hour  Intake 390 ml  Output 2410 ml  Net -2020 ml   Filed Weights   03/30/21 2345 04/05/21 1336  Weight: 83.9 kg 80.7 kg   Gen: 65 y.o. female in no distress Pulm: Nonlabored breathing room air. Clear. CV: Regular rate and rhythm. No new murmur, rub, or gallop. No JVD, no dependent edema. GI: Abdomen soft, non-tender, non-distended, with normoactive bowel sounds.  Ext: Warm, no deformities Skin: No new rashes, lesions or ulcers on visualized skin. Pustules are resolved diffusely, no new lesions. Right knee  dressing c/d/I, drain site c/d/I. No spreading erythema or undue tenderness.  Neuro: Alert and oriented. Movement in legs much improved, sensation intact. No focal neurological deficits. Psych: Judgement and insight appear fair. Mood euthymic & affect congruent. Behavior is appropriate.    Data Reviewed: I have personally reviewed following labs and imaging studies  CBC: Recent Labs  Lab 04/03/21 0015 04/04/21 0359 04/05/21 0322 04/05/21 1802 04/08/21 0430  WBC 22.8* 18.0* 19.7* 23.4* 18.3*  HGB 12.5 12.8 12.1 12.5 11.9*  HCT 36.2 37.0 36.4 36.7 35.6*  MCV 85.2 84.1 86.1 86.4 86.0  PLT 350 401* 447* 499* 493*   Basic Metabolic Panel: Recent Labs  Lab 04/02/21 0353 04/03/21 0015 04/04/21 0359 04/05/21 0322 04/05/21 1802 04/08/21 0430  NA 131* 133* 135 136  --  134*  K 3.5 3.3* 3.4* 3.3*  --  3.7  CL 98 101 101 98  --  97*  CO2 24 25 26 29   --  30  GLUCOSE 207* 179* 221* 218*  --  217*  BUN 15 13 11 10   --  5*  CREATININE 0.87 0.88 0.81 0.82 0.85 0.72  CALCIUM 8.4* 8.7*  8.7* 8.6*  --  8.4*  MG 2.3 2.2 2.0 2.0  --   --    GFR: Estimated Creatinine Clearance: 75.1 mL/min (by C-G formula based on SCr of 0.72 mg/dL). Liver Function Tests: No results for input(s): AST, ALT, ALKPHOS, BILITOT, PROT, ALBUMIN in the last 168 hours.  No results for input(s): LIPASE, AMYLASE in the last 168 hours. No results for input(s): AMMONIA in the last 168 hours. Coagulation Profile: No results for input(s): INR, PROTIME in the last 168 hours. Cardiac Enzymes: No results for input(s): CKTOTAL, CKMB, CKMBINDEX, TROPONINI in the last 168 hours.  BNP (last 3 results) No results for input(s): PROBNP in the last 8760 hours. HbA1C: No results for input(s): HGBA1C in the last 72 hours. CBG: Recent Labs  Lab 04/07/21 1103 04/07/21 1601 04/07/21 2132 04/08/21 0556 04/08/21 1110  GLUCAP 253* 231* 202* 225* 204*   Lipid Profile: Recent Labs    04/05/21 1802  CHOL 87  HDL 19*   LDLCALC 44  TRIG 121  CHOLHDL 4.6   Thyroid Function Tests: No results for input(s): TSH, T4TOTAL, FREET4, T3FREE, THYROIDAB in the last 72 hours. Anemia Panel: No results for input(s): VITAMINB12, FOLATE, FERRITIN, TIBC, IRON, RETICCTPCT in the last 72 hours. Urine analysis:    Component Value Date/Time   COLORURINE YELLOW 03/31/2021 0239   APPEARANCEUR CLOUDY (A) 03/31/2021 0239   LABSPEC 1.009 03/31/2021 0239   PHURINE 5.0 03/31/2021 0239   GLUCOSEU >=500 (A) 03/31/2021 0239   HGBUR SMALL (A) 03/31/2021 0239   BILIRUBINUR NEGATIVE 03/31/2021 0239   KETONESUR 20 (A) 03/31/2021 0239   PROTEINUR 100 (A) 03/31/2021 0239   NITRITE NEGATIVE 03/31/2021 0239   LEUKOCYTESUR MODERATE (A) 03/31/2021 0239   Recent Results (from the past 240 hour(s))  Resp Panel by RT-PCR (Flu A&B, Covid) Nasopharyngeal Swab     Status: None   Collection Time: 03/31/21 12:21 AM   Specimen: Nasopharyngeal Swab; Nasopharyngeal(NP) swabs in vial transport medium  Result Value Ref Range Status   SARS Coronavirus 2 by RT PCR NEGATIVE NEGATIVE Final    Comment: (NOTE) SARS-CoV-2 target nucleic acids are NOT DETECTED.  The SARS-CoV-2 RNA is generally detectable in upper respiratory specimens during the acute phase of infection. The lowest concentration of SARS-CoV-2 viral copies this assay can detect is 138 copies/mL. A negative result does not preclude SARS-Cov-2 infection and should not be used as the sole basis for treatment or other patient management decisions. A negative result may occur with  improper specimen collection/handling, submission of specimen other than nasopharyngeal swab, presence of viral mutation(s) within the areas targeted by this assay, and inadequate number of viral copies(<138 copies/mL). A negative result must be combined with clinical observations, patient history, and epidemiological information. The expected result is Negative.  Fact Sheet for Patients:   BloggerCourse.com  Fact Sheet for Healthcare Providers:  SeriousBroker.it  This test is no t yet approved or cleared by the [place] FDA and  has been authorized for detection and/or diagnosis of SARS-CoV-2 by FDA under an Emergency Use Authorization (EUA). This EUA will remain  in effect (meaning this test can be used) for the duration of the COVID-19 declaration under Section 564(b)(1) of the Act, 21 U.S.C.section 360bbb-3(b)(1), unless the authorization is terminated  or revoked sooner.       Influenza A by PCR NEGATIVE NEGATIVE Final   Influenza B by PCR NEGATIVE NEGATIVE Final    Comment: (NOTE) The Xpert Xpress SARS-CoV-2/FLU/RSV plus assay is intended as an  aid in the diagnosis of influenza from Nasopharyngeal swab specimens and should not be used as a sole basis for treatment. Nasal washings and aspirates are unacceptable for Xpert Xpress SARS-CoV-2/FLU/RSV testing.  Fact Sheet for Patients: BloggerCourse.com  Fact Sheet for Healthcare Providers: SeriousBroker.it  This test is not yet approved or cleared by the [place] FDA and has been authorized for detection and/or diagnosis of SARS-CoV-2 by FDA under an Emergency Use Authorization (EUA). This EUA will remain in effect (meaning this test can be used) for the duration of the COVID-19 declaration under Section 564(b)(1) of the Act, 21 U.S.C. section 360bbb-3(b)(1), unless the authorization is terminated or revoked.  Performed at [facility] Lab, [address] [place], [place] [address]   Urine Culture     Status: Abnormal   Collection Time: 03/31/21  5:51 AM   Specimen: Urine, Clean Catch  Result Value Ref Range Status   Specimen Description URINE, CLEAN CATCH  Final   Special Requests NONE  Final   Culture (A)  Final    >=100,000 COLONIES/mL METHICILLIN RESISTANT STAPHYLOCOCCUS AUREUS 40,000  COLONIES/mL AEROCOCCUS SPECIES Standardized susceptibility testing for this organism is not available. Performed at [facility], [address], [place], [place] [address]    Report Status 04/02/2021 FINAL  Final   Organism ID, Bacteria METHICILLIN RESISTANT STAPHYLOCOCCUS AUREUS (A)  Final      Susceptibility   Methicillin resistant staphylococcus aureus - MIC*    CIPROFLOXACIN >=8 RESISTANT Resistant     GENTAMICIN <=0.5 SENSITIVE Sensitive     NITROFURANTOIN <=16 SENSITIVE Sensitive     OXACILLIN >=4 RESISTANT Resistant     TETRACYCLINE <=1 SENSITIVE Sensitive     VANCOMYCIN 1 SENSITIVE Sensitive     TRIMETH/SULFA <=10 SENSITIVE Sensitive     CLINDAMYCIN <=0.25 SENSITIVE Sensitive     RIFAMPIN <=0.5 SENSITIVE Sensitive     Inducible Clindamycin NEGATIVE Sensitive     * >=100,000 COLONIES/mL METHICILLIN RESISTANT STAPHYLOCOCCUS AUREUS  Culture, blood (Routine X 2) w Reflex to ID Panel     Status: Abnormal   Collection Time: 04/01/21 11:08 AM   Specimen: BLOOD  Result Value Ref Range Status   Specimen Description BLOOD LEFT ANTECUBITAL  Final   Special Requests   Final    BOTTLES DRAWN AEROBIC AND ANAEROBIC Blood Culture adequate volume   Culture  Setup Time   Final    GRAM POSITIVE COCCI IN CLUSTERS IN BOTH AEROBIC AND ANAEROBIC BOTTLES Organism ID to follow CRITICAL RESULT CALLED TO, READ BACK BY AND VERIFIED WITH: PARMD MIRANDA BRYK BY MESSAN H. AT 0552 02 7 27 2022 Performed at [facility], [address] [place], [place] [address]    Culture METHICILLIN RESISTANT STAPHYLOCOCCUS AUREUS (A)  Final   Report Status 04/04/2021 FINAL  Final   Organism ID, Bacteria METHICILLIN RESISTANT STAPHYLOCOCCUS AUREUS  Final      Susceptibility   Methicillin resistant staphylococcus aureus - MIC*    CIPROFLOXACIN >=8 RESISTANT Resistant     ERYTHROMYCIN >=8 RESISTANT Resistant     GENTAMICIN <=0.5 SENSITIVE Sensitive     OXACILLIN >=4 RESISTANT Resistant     TETRACYCLINE  <=1 SENSITIVE Sensitive     VANCOMYCIN 1 SENSITIVE Sensitive     TRIMETH/SULFA <=10 SENSITIVE Sensitive     CLINDAMYCIN <=0.25 SENSITIVE Sensitive     RIFAMPIN <=0.5 SENSITIVE Sensitive     Inducible Clindamycin NEGATIVE Sensitive     * METHICILLIN RESISTANT STAPHYLOCOCCUS AUREUS  Blood Culture  ID Panel (Reflexed)     Status: Abnormal   Collection Time: 04/01/21 11:08 AM  Result Value Ref Range Status   Enterococcus faecalis NOT DETECTED NOT DETECTED Final   Enterococcus Faecium NOT DETECTED NOT DETECTED Final   Listeria monocytogenes NOT DETECTED NOT DETECTED Final   Staphylococcus species DETECTED (A) NOT DETECTED Final    Comment: CRITICAL RESULT CALLED TO, READ BACK BY AND VERIFIED WITH: PARMD MIRANDA BRYK BY MESSAN H. AT 0552 02 7 27 2022    Staphylococcus aureus (BCID) DETECTED (A) NOT DETECTED Final    Comment: Methicillin (oxacillin)-resistant Staphylococcus aureus (MRSA). MRSA is predictably resistant to beta-lactam antibiotics (except ceftaroline). Preferred therapy is vancomycin unless clinically contraindicated. Patient requires contact precautions if  hospitalized. CRITICAL RESULT CALLED TO, READ BACK BY AND VERIFIED WITH: PARMD MIRANDA BRYK BY MESSAN H. AT 0552 02 7 27 2022    Staphylococcus epidermidis NOT DETECTED NOT DETECTED Final   Staphylococcus lugdunensis NOT DETECTED NOT DETECTED Final   Streptococcus species NOT DETECTED NOT DETECTED Final   Streptococcus agalactiae NOT DETECTED NOT DETECTED Final   Streptococcus pneumoniae NOT DETECTED NOT DETECTED Final   Streptococcus pyogenes NOT DETECTED NOT DETECTED Final   A.calcoaceticus-baumannii NOT DETECTED NOT DETECTED Final   Bacteroides fragilis NOT DETECTED NOT DETECTED Final   Enterobacterales NOT DETECTED NOT DETECTED Final   Enterobacter cloacae complex NOT DETECTED NOT DETECTED Final   Escherichia coli NOT DETECTED NOT DETECTED Final   Klebsiella aerogenes NOT DETECTED NOT DETECTED Final   Klebsiella  oxytoca NOT DETECTED NOT DETECTED Final   Klebsiella pneumoniae NOT DETECTED NOT DETECTED Final   Proteus species NOT DETECTED NOT DETECTED Final   Salmonella species NOT DETECTED NOT DETECTED Final   Serratia marcescens NOT DETECTED NOT DETECTED Final   Haemophilus influenzae NOT DETECTED NOT DETECTED Final   Neisseria meningitidis NOT DETECTED NOT DETECTED Final   Pseudomonas aeruginosa NOT DETECTED NOT DETECTED Final   Stenotrophomonas maltophilia NOT DETECTED NOT DETECTED Final   Candida albicans NOT DETECTED NOT DETECTED Final   Candida auris NOT DETECTED NOT DETECTED Final   Candida glabrata NOT DETECTED NOT DETECTED Final   Candida krusei NOT DETECTED NOT DETECTED Final   Candida parapsilosis NOT DETECTED NOT DETECTED Final   Candida tropicalis NOT DETECTED NOT DETECTED Final   Cryptococcus neoformans/gattii NOT DETECTED NOT DETECTED Final   Meth resistant mecA/C and MREJ DETECTED (A) NOT DETECTED Final    Comment: CRITICAL RESULT CALLED TO, READ BACK BY AND VERIFIED WITH: PARMD MIRANDA BRYK BY MESSAN H. AT 0552 02 7 27 2022 Performed at [facility], [address] [place], [place] [address]   Culture, blood (Routine X 2) w Reflex to ID Panel     Status: Abnormal   Collection Time: 04/01/21 11:18 AM   Specimen: BLOOD RIGHT HAND  Result Value Ref Range Status   Specimen Description BLOOD RIGHT HAND  Final   Special Requests   Final    BOTTLES DRAWN AEROBIC ONLY Blood Culture adequate volume   Culture  Setup Time   Final    GRAM POSITIVE COCCI IN CLUSTERS AEROBIC BOTTLE ONLY CRITICAL VALUE NOTED.  VALUE IS CONSISTENT WITH PREVIOUSLY REPORTED AND CALLED VALUE.    Culture (A)  Final    STAPHYLOCOCCUS AUREUS SUSCEPTIBILITIES PERFORMED ON PREVIOUS CULTURE WITHIN THE LAST 5 DAYS. Performed at [facility], [address] [place], [place] [address]    Report Status 04/04/2021 FINAL  Final  Culture, blood (Routine X 2)  w Reflex to ID Panel     Status: None    Collection Time: 04/02/21 11:50 PM   Specimen: BLOOD RIGHT HAND  Result Value Ref Range Status   Specimen Description BLOOD RIGHT HAND  Final   Special Requests   Final    BOTTLES DRAWN AEROBIC ONLY Blood Culture adequate volume   Culture   Final    NO GROWTH 5 DAYS Performed at [facility], [address] [place], [place] [address]    Report Status 04/08/2021 FINAL  Final  Culture, blood (Routine X 2) w Reflex to ID Panel     Status: None   Collection Time: 04/03/21 12:09 AM   Specimen: BLOOD LEFT HAND  Result Value Ref Range Status   Specimen Description BLOOD LEFT HAND  Final   Special Requests   Final    BOTTLES DRAWN AEROBIC ONLY Blood Culture adequate volume   Culture   Final    NO GROWTH 5 DAYS Performed at [facility], [address], [place], [place] [address]    Report Status 04/08/2021 FINAL  Final  Body fluid culture w Gram Stain     Status: None   Collection Time: 04/05/21 10:52 AM   Specimen: Synovium; Synovial Fluid  Result Value Ref Range Status   Specimen Description SYNOVIAL FLUID  Final   Special Requests RIGHT KNEE  Final   Gram Stain   Final    ABUNDANT WBC PRESENT, PREDOMINANTLY PMN NO ORGANISMS SEEN Gram Stain Report Called to,Read Back By and Verified With: SWINTEK MD @1425  04/05/21 EB      Culture   Final    RARE METHICILLIN RESISTANT STAPHYLOCOCCUS AUREUS CRITICAL RESULT CALLED TO, READ BACK BY AND VERIFIED WITH: RN H.MCINTOSH AT 1024 ON 04/07/2021 BY T.SAAD. Performed at [facility], [address], [place], [place]    Report Status 04/08/2021 FINAL  Final   Organism ID, Bacteria METHICILLIN RESISTANT STAPHYLOCOCCUS AUREUS  Final      Susceptibility   Methicillin resistant staphylococcus aureus - MIC*    CIPROFLOXACIN >=8 RESISTANT Resistant     ERYTHROMYCIN >=8 RESISTANT Resistant     GENTAMICIN <=0.5 SENSITIVE Sensitive     OXACILLIN >=4 RESISTANT Resistant     TETRACYCLINE <=1 SENSITIVE Sensitive      VANCOMYCIN <=0.5 SENSITIVE Sensitive     TRIMETH/SULFA <=10 SENSITIVE Sensitive     CLINDAMYCIN <=0.25 SENSITIVE Sensitive     RIFAMPIN <=0.5 SENSITIVE Sensitive     Inducible Clindamycin NEGATIVE Sensitive     * RARE METHICILLIN RESISTANT STAPHYLOCOCCUS AUREUS  Anaerobic culture w Gram Stain     Status: None (Preliminary result)   Collection Time: 04/05/21 10:52 AM   Specimen: Synovium; Synovial Fluid  Result Value Ref Range Status   Specimen Description SYNOVIAL FLUID  Final   Special Requests   Final    RIGHT KNEE Performed at [facility], [address] [place], [place]    Gram Stain PENDING  Incomplete   Culture   Final    NO ANAEROBES ISOLATED; CULTURE IN PROGRESS FOR 5 DAYS   Report Status PENDING  Incomplete  Aerobic/Anaerobic Culture w Gram Stain (surgical/deep wound)     Status: None (Preliminary result)   Collection Time: 04/05/21  4:07 PM   Specimen: Tissue  Result Value Ref Range Status   Specimen Description TISSUE  Final   Special Requests RIGHT KNEE SYNOVIUM SPEC A  Final   Gram Stain  Final    RARE WBC PRESENT, PREDOMINANTLY MONONUCLEAR NO ORGANISMS SEEN Performed at [facility], [address] [place], [place] [id number]    Culture   Final    RARE STAPHYLOCOCCUS AUREUS SUSCEPTIBILITIES TO FOLLOW NO ANAEROBES ISOLATED; CULTURE IN PROGRESS FOR 5 DAYS    Report Status PENDING  Incomplete      Radiology Studies: No results found.  Scheduled Meds:  amLODipine  5 mg Oral Daily   docusate sodium  100 mg Oral BID   enoxaparin (LOVENOX) injection  40 mg Subcutaneous Q24H   insulin aspart  0-15 Units Subcutaneous TID WC   insulin aspart  0-5 Units Subcutaneous QHS   insulin aspart  5 Units Subcutaneous TID WC   insulin glargine-yfgn  18 Units Subcutaneous Daily   losartan  50 mg Oral QHS   metoprolol tartrate  50 mg Oral BID   omega-3 acid ethyl esters  1 g Oral Daily   polyethylene glycol  17 g Oral Daily   senna  2 tablet Oral  BID   Continuous Infusions:  vancomycin 750 mg (04/08/21 0636)     LOS: 7 days   Time spent: 35 minutes.  Tyrone Nineyan B Shepard Keltz, MD [facility] [email or web address] 04/08/2021, 12:04 PM

## 2021-04-08 NOTE — PMR Pre-admission (Signed)
PMR Admission Coordinator Pre-Admission Assessment  Patient: Katie Serrano is an 65 y.o., female MRN: [id number] DOB: September 15, 1955 Height: 5' 6"  (167.6 cm) Weight: 80.7 kg  Insurance Information HMO:     PPO:      PCP:      IPA:      80/20:      OTHER:  PRIMARY: Medicare A/B      Policy#: [id number]      Subscriber: pt CM Name:       Phone#:      Fax#:  Pre-Cert#: verified Civil engineer, contracting:  Benefits:  Phone #:      Name:  Eff. Date: 06/07/2020 A and B     Deduct: $1556      Out of Pocket Max: n/a      Life Max: n/a CIR: 100%      SNF: 20 full days Outpatient: 80%     Co-Pay: 20% Home Health: 100%      Co-Pay:  DME: 80%     Co-Pay: 20% Providers: pt choice SECONDARY: [employer] Policy#: [id number]     Phone#:   [phone]      Financial Counselor:       Phone#:   The "Data Collection Information Summary" for patients in Inpatient Rehabilitation Facilities with attached "Privacy Act [place] Records" was provided and verbally reviewed with: Patient and Family  Emergency Contact Information Contact Information     Name Relation Home Work Mobile   Colter,Michael Spouse [phone]  [place] Daughter   [phone]       Current Medical History  Patient Admitting Diagnosis: debility 2/2 sepsis and myositis  History of Present Illness: Pt is a 65 y/o female who presents to [facility] on 03/30/21 with fever 105*F and malaise found to have sepsis due to MRSA UTI and bacteremia.  PMH HTN, asthma, toacco use, COPD, DM, HDL, and OA.  TTE and TEE negative for vegetation.  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  Neurosurgery recommended no surgical intervention.  Brain MRI on 7/28 showed scattered infarcts involving hte left caudate head, L parietal lobe, left splenium, and right temporal lobe as well as diffuse leptomeningeal  enhancement concerning for meningitis.  Neurology was consulted and recommended continuing abx for sepsis as this would be the same treatment for meningitis.  Pt's right knee developed swelling and pain and was found to have septic arthritis so arthrotomy and drainage performing.  Plan to continue IV Abx x6 weeks.  Therapy evaluations were completed and pt was recommended for CIR.     Patient's medical record from [facility] has been reviewed by the rehabilitation admission coordinator and physician.  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   Elevated C-reactive protein (CRP) 01/15/2021   Encounter for long-term current use of high risk medication 01/29/2016   Essential hypertension 01/29/2016   Gastroesophageal reflux disease 01/29/2016   Intermittent asthma without complication 01/11/9037   Mixed hyperlipidemia 05/01/2019   Osteoarthritis of multiple joints 01/29/2016   Shortness of breath 01/13/2021   Tobacco use disorder 12/02/2017   Type 2 diabetes mellitus without complication, without long-term current use of insulin ([place]) 01/30/2016  Last Assessment & Plan:  Formatting of this note might be different from the original. Relevant Hx: Course: Daily Update: Today's Plan:discussed with her that her sugars will increase and she has actively worked on her diet and diet and lost from 242 to 188 which I commend her on, she is to continue with her weight loss efforts despite taking this   Electronically signed by: Danie Chandler    Family History   family history includes Angina in her father; Diabetes in her mother and paternal grandmother; Heart attack (age of onset: 4) in her father; Heart attack (age of onset: 74) in her mother.  Prior Rehab/Hospitalizations Has the patient had prior rehab or hospitalizations prior  to admission? No  Has the patient had major surgery during 100 days prior to admission? Yes   Current Medications  Current Facility-Administered Medications:    [START ON 04/09/2021] amLODipine (NORVASC) tablet 10 mg, 10 mg, Oral, Daily, Patrecia Pour, MD   bisacodyl (DULCOLAX) suppository 10 mg, 10 mg, Rectal, Once, Patrecia Pour, MD   dextromethorphan-guaiFENesin ([place] DM) 30-600 MG per 12 hr tablet 1 tablet, 1 tablet, Oral, BID PRN, Rod Can, MD   docusate sodium (COLACE) capsule 100 mg, 100 mg, Oral, BID, Swinteck, Aaron Edelman, MD, 100 mg at 04/08/21 0931   enoxaparin (LOVENOX) injection 40 mg, 40 mg, Subcutaneous, Q24H, Swinteck, Aaron Edelman, MD, 40 mg at 04/08/21 0931   hydrALAZINE (APRESOLINE) injection 10 mg, 10 mg, Intravenous, Q4H PRN, Rod Can, MD, 10 mg at 04/05/21 8841   insulin aspart (novoLOG) injection 0-15 Units, 0-15 Units, Subcutaneous, TID WC, Swinteck, Aaron Edelman, MD, 5 Units at 04/08/21 1118   insulin aspart (novoLOG) injection 0-5 Units, 0-5 Units, Subcutaneous, QHS, Swinteck, Aaron Edelman, MD, 2 Units at 04/07/21 2206   insulin aspart (novoLOG) injection 7 Units, 7 Units, Subcutaneous, TID WC, Patrecia Pour, MD   [START ON 04/09/2021] insulin glargine-yfgn (SEMGLEE) injection 20 Units, 20 Units, Subcutaneous, Daily, Vance Gather B, MD   ipratropium (ATROVENT) 0.03 % nasal spray 2 spray, 2 spray, Each Nare, BID, Patrecia Pour, MD   ipratropium-albuterol (DUONEB) 0.5-2.5 (3) MG/3ML nebulizer solution 3 mL, 3 mL, Nebulization, Q4H PRN, Swinteck, Aaron Edelman, MD   losartan (COZAAR) tablet 50 mg, 50 mg, Oral, QHS, Swinteck, Aaron Edelman, MD, 50 mg at 04/07/21 2205   menthol-cetylpyridinium (CEPACOL) lozenge 3 mg, 1 lozenge, Oral, PRN, Patrecia Pour, MD   metoCLOPramide (REGLAN) tablet 5-10 mg, 5-10 mg, Oral, Q8H PRN **OR** metoCLOPramide (REGLAN) injection 5-10 mg, 5-10 mg, Intravenous, Q8H PRN, Swinteck, Aaron Edelman, MD   metoprolol tartrate (LOPRESSOR) tablet 50 mg, 50 mg, Oral, BID, Patrecia Pour, MD,  50 mg at 04/08/21 0932   omega-3 acid ethyl esters (LOVAZA) capsule 1 g, 1 g, Oral, Daily, Swinteck, Aaron Edelman, MD, 1 g at 04/08/21 0931   ondansetron (ZOFRAN) tablet 4 mg, 4 mg, Oral, Q6H PRN **OR** ondansetron (ZOFRAN) injection 4 mg, 4 mg, Intravenous, Q6H PRN, Swinteck, Aaron Edelman, MD   oxyCODONE (Oxy IR/ROXICODONE) immediate release tablet 5 mg, 5 mg, Oral, Q4H PRN, Swinteck, Aaron Edelman, MD, 5 mg at 04/08/21 0636   phenol (CHLORASEPTIC) mouth spray 1 spray, 1 spray, Mouth/Throat, PRN, Vance Gather B, MD   polyethylene glycol (MIRALAX / GLYCOLAX) packet 17 g, 17 g, Oral, Daily, Vance Gather B, MD, 17 g at 04/08/21 0931   senna (SENOKOT) tablet 17.2 mg, 2 tablet, Oral, BID, Vance Gather B, MD, 17.2 mg at 04/08/21 0931   vancomycin (VANCOREADY) IVPB 750 mg/150 mL, 750 mg, Intravenous, Q8H,  Pat Patrick [facility], Last Rate: 150 mL/hr at 04/08/21 0636, 750 mg at 04/08/21 0636  Patients Current Diet:  Diet Order             Diet Carb Modified Fluid consistency: Thin; Room service appropriate? Yes  Diet effective now                   Precautions / Restrictions Precautions Precautions: Fall Precaution Comments: drain to R knee, mod fall Restrictions Weight Bearing Restrictions: Yes RLE Weight Bearing: Weight bearing as tolerated   Has the patient had 2 or more falls or a fall with injury in the past year? No  Prior Activity Level    Prior Functional Level Self Care: Did the patient need help bathing, dressing, using the toilet or eating? Independent  Indoor Mobility: Did the patient need assistance with walking from room to room (with or without device)? Independent  Stairs: Did the patient need assistance with internal or external stairs (with or without device)? Independent  Functional Cognition: Did the patient need help planning regular tasks such as shopping or remembering to take medications? Independent  Home Assistive Devices / Equipment Home Assistive Devices/Equipment:  Eyeglasses, Dentures (specify type) Home Equipment: Shower seat - built in  Prior Device Use: Indicate devices/aids used by the patient prior to current illness, exacerbation or injury? None of the above  Current Functional Level Cognition  Overall Cognitive Status: Within Functional Limits for tasks assessed Orientation Level: Oriented X4 Following Commands: Follows one step commands consistently Safety/Judgement: Decreased awareness of safety, Decreased awareness of deficits General Comments: Anxious to attempt any movement due to pain    Extremity Assessment (includes Sensation/Coordination)  Upper Extremity Assessment: Generalized weakness RUE Deficits / Details: 4-/5 MMT shoulder flexion/extension, grip strength LUE Deficits / Details: 3+/5 MMT shoulder flexion/extension, grip strength  Lower Extremity Assessment: Defer to PT evaluation    ADLs  Overall ADL's : Needs assistance/impaired Eating/Feeding: Independent, Bed level Grooming: Wash/dry hands, Wash/dry face, Applying deodorant, Oral care, Bed level, Set up Upper Body Bathing: Set up, Bed level Lower Body Bathing: Maximal assistance, Bed level Upper Body Dressing : Sitting, Set up Upper Body Dressing Details (indicate cue type and reason): long sitting in bed Lower Body Dressing: Maximal assistance, Bed level Toilet Transfer: Total assistance (bed pan) Toileting- Clothing Manipulation and Hygiene: Maximal assistance, Bed level Toileting - Clothing Manipulation Details (indicate cue type and reason): Min A for balance during clothing management and wiping Functional mobility during ADLs: Minimal assistance (bed level only hthis session) General ADL Comments: Pt unable to sit EOB this session due to pain in R knee with movement. ADLs completed at bed level    Mobility  Overal bed mobility: Needs Assistance Bed Mobility: Supine to Sit, Sit to Supine Rolling: Min assist Sidelying to sit: Mod assist Supine to sit: Mod  assist, HOB elevated Sit to supine: Mod assist Sit to sidelying: Min assist General bed mobility comments: Patient requires assist from husband to manage R LE when moving in bed due to pain. Has good ability to use UEs to move in bed when cued. Patient able to bring R LE close to floor with support from sheet under it.    Transfers  Overall transfer level: Needs assistance Equipment used: Rolling walker (2 wheeled) Transfers: Sit to/from Stand Sit to Stand: Min assist, From elevated surface General transfer comment: Patient able to stand briefly at edge of bed with min assist. R LE kept off floor due to pain.  Ambulation / Gait / Stairs / Wheelchair Mobility  Ambulation/Gait Ambulation/Gait assistance: Herbalist (Feet): 10 Feet (seated/toileted; 10) Assistive device: Rolling walker (2 wheeled) Gait Pattern/deviations: Step-to pattern, Decreased step length - right, Decreased step length - left, Shuffle General Gait Details: Unable due to pain Gait velocity: slowed Gait velocity interpretation: <1.31 ft/sec, indicative of household ambulator    Posture / Balance Dynamic Sitting Balance Sitting balance - Comments: able to balance sitting at edge of bed with support to right LE Balance Overall balance assessment: Needs assistance Sitting-balance support: Feet supported, Bilateral upper extremity supported Sitting balance-Leahy Scale: Good Sitting balance - Comments: able to balance sitting at edge of bed with support to right LE Standing balance support: Bilateral upper extremity supported Standing balance-Leahy Scale: Fair Standing balance comment: able to stand briefly with min guard assist and RW    Special needs/care consideration Diabetic management yes   Previous Home Environment (from acute therapy documentation) Living Arrangements: Spouse/significant other Available Help at Discharge: Family, Available 24 hours/day Type of Home: House Home Layout: One  level Home Access: Stairs to enter Entrance Stairs-Rails: Can reach both Entrance Stairs-Number of Steps: 3 Bathroom Shower/Tub: Gaffer, Chiropodist: Standard Home Care Services: No Additional Comments: Pt husband present and supportive throughout session  Discharge Living Setting Plans for Discharge Living Setting: Patient's home, Lives with (comment) (spouse) Type of Home at Discharge: House Discharge Home Layout: One level Discharge Home Access: Stairs to enter Entrance Stairs-Rails: Can reach both Entrance Stairs-Number of Steps: 3 Discharge Bathroom Shower/Tub: Walk-in shower, Tub/shower unit Discharge Bathroom Toilet: Standard Discharge Bathroom Accessibility: Yes How Accessible: Accessible via walker Does the patient have any problems obtaining your medications?: No  Social/Family/Support Systems Patient Roles: Spouse Anticipated Caregiver: spouse, Mikel Pyon Anticipated Caregiver's Contact Information: [phone] Ability/Limitations of Caregiver: min assist Caregiver Availability: 24/7 Discharge Plan Discussed with Primary Caregiver: Yes Is Caregiver In Agreement with Plan?: Yes Does Caregiver/Family have Issues with Lodging/Transportation while Pt is in Rehab?: No  Goals Patient/Family Goal for Rehab: PT/OT mod I, SLP n/a Expected length of stay: 10-14 days Pt/Family Agrees to Admission and willing to participate: Yes Program Orientation Provided & Reviewed with Pt/Caregiver Including Roles  & Responsibilities: Yes  Decrease burden of Care through IP rehab admission: n/a  Possible need for SNF placement upon discharge: No  Patient Condition: I have reviewed medical records from [facility], spoken with CM, and patient and spouse. I met with patient at the bedside for inpatient rehabilitation assessment.  Patient will benefit from ongoing PT and OT, can actively participate in 3 hours of therapy a day 5 days of the week, and can  make measurable gains during the admission.  Patient will also benefit from the coordinated team approach during an Inpatient Acute Rehabilitation admission.  The patient will receive intensive therapy as well as Rehabilitation physician, nursing, social worker, and care management interventions.  Due to safety, disease management, medication administration, pain management, and patient education the patient requires 24 hour a day rehabilitation nursing.  The patient is currently mod +2 with mobility and basic ADLs.  Discharge setting and therapy post discharge at home with home health is anticipated.  Patient has agreed to participate in the Acute Inpatient Rehabilitation Program and will admit today.  Preadmission Screen Completed By:  Michel Santee, PT, DPT 04/08/2021 1:27 PM ______________________________________________________________________   Discussed status with Dr. Ranell Patrick on 04/10/21  at 10:11 AM  and received approval for admission today.  Admission  Coordinator:  Michel Santee, PT, DPT time 10:11 AM Sudie Grumbling 04/10/21    Assessment/Plan: Diagnosis: MRSA bacteremia Does the need for close, 24 hr/day Medical supervision in concert with the patient's rehab needs make it unreasonable for this patient to be served in a less intensive setting? Yes Co-Morbidities requiring supervision/potential complications: overweight (BMI 28.73), paraspinal myositis, essential hypertension, mized hyperlipidemia, type 2 DM with hyperglycemia Due to bladder management, bowel management, safety, skin/wound care, disease management, medication administration, pain management, and patient education, does the patient require 24 hr/day rehab nursing? Yes Does the patient require coordinated care of a physician, rehab nurse, PT, OT to address physical and functional deficits in the context of the above medical diagnosis(es)? Yes Addressing deficits in the following areas: balance, endurance, locomotion, strength,  transferring, bowel/bladder control, bathing, dressing, feeding, grooming, toileting, and psychosocial support Can the patient actively participate in an intensive therapy program of at least 3 hrs of therapy 5 days a week? Yes The potential for patient to make measurable gains while on inpatient rehab is excellent Anticipated functional outcomes upon discharge from inpatient rehab: min assist PT, min assist OT, independent SLP Estimated rehab length of stay to reach the above functional goals is: 2-3 weeks Anticipated discharge destination: Home 10. Overall Rehab/Functional Prognosis: excellent   MD Signature: Leeroy Cha, MD

## 2021-04-08 NOTE — Progress Notes (Signed)
Inpatient Rehab Admissions Coordinator:   Called and spoke to pt over the phone to discuss CIR goals/expectations.  I reviewed 3 hrs/day of therapy, likely split between OT and PT, and ELOS up to 2 weeks, but dependent upon progress.  She reports her pain seems to be better controlled today, but she doesn't feel quite ready to jump into intense therapy just yet.  I let her know I would check her Medicare and f/u with Dr. Jarvis Newcomer regarding timing of medical readiness.    Estill Dooms, PT, DPT Admissions Coordinator [phone] 04/08/21  1:25 PM

## 2021-04-08 NOTE — Progress Notes (Signed)
Orthopedic Tech Progress Note Patient Details:  Katie Serrano 1955-12-07 [id number]  Ortho Devices Type of Ortho Device: Knee Immobilizer Ortho Device/Splint Location: R knee Ortho Device/Splint Interventions: Ordered, Application, Adjustment   Post Interventions Patient Tolerated: Poor (very painful)  Brought 20" KI and it was too long, brought next smaller size down and it looks a little short, but will likely do better than the long one.  Attempted to adjust the fit for the pt, but she was too painful and would not let her leg rest down in the brace.  KI left in room as pt only needs it when OOB.    Thanks,  Corinna Capra, PT, DPT  Acute Rehabilitation Ortho Tech Supervisor [phone] pager #[phone] office    Rollene Rotunda Hibo Blasdell 04/08/2021, 5:41 PM

## 2021-04-08 NOTE — Progress Notes (Signed)
Inpatient Diabetes Program Recommendations  AACE/ADA: New Consensus Statement on Inpatient Glycemic Control (2015)  Target Ranges:  Prepandial:   less than 140 mg/dL      Peak postprandial:   less than 180 mg/dL (1-2 hours)      Critically ill patients:  140 - 180 mg/dL   Lab Results  Component Value Date   GLUCAP 204 (H) 04/08/2021   HGBA1C 9.8 (H) 03/31/2021    Review of Glycemic Control  Diabetes history: DM2 Outpatient Diabetes medications: metformin 500 mg BID Current orders for Inpatient glycemic control: Semglee 18 units QD, Novolog 0-15 units TID and 0-5 HS + 5 units TID  HgbA1C - 9.8%  Inpatient Diabetes Program Recommendations:    Consider increasing Semglee to 20 units QD Increase Novolog to 7 units TID if eating > 50% meal  Continue to follow.  Thank you. Ailene Ards, RD, LDN, CDE Inpatient Diabetes Coordinator [phone]

## 2021-04-09 DIAGNOSIS — N10 Acute tubulo-interstitial nephritis: Secondary | ICD-10-CM | POA: Diagnosis not present

## 2021-04-09 DIAGNOSIS — R7881 Bacteremia: Secondary | ICD-10-CM | POA: Diagnosis not present

## 2021-04-09 DIAGNOSIS — I330 Acute and subacute infective endocarditis: Secondary | ICD-10-CM | POA: Diagnosis not present

## 2021-04-09 DIAGNOSIS — E6609 Other obesity due to excess calories: Secondary | ICD-10-CM | POA: Diagnosis not present

## 2021-04-09 LAB — GLUCOSE, CAPILLARY
Glucose-Capillary: 204 mg/dL — ABNORMAL HIGH (ref 70–99)
Glucose-Capillary: 352 mg/dL — ABNORMAL HIGH (ref 70–99)
Glucose-Capillary: 243 mg/dL — ABNORMAL HIGH (ref 70–99)
Glucose-Capillary: 148 mg/dL — ABNORMAL HIGH (ref 70–99)

## 2021-04-09 LAB — C-REACTIVE PROTEIN: CRP: 11.6 mg/dL — ABNORMAL HIGH (ref <1.0)

## 2021-04-09 LAB — SEDIMENTATION RATE: Sed Rate: 78 mm/hr — ABNORMAL HIGH (ref 0–22)

## 2021-04-09 MED ORDER — OXYMETAZOLINE HCL 0.05 % NA SOLN
1.0000 | Freq: Two times a day (BID) | NASAL | Status: AC
  Administered 2021-04-09 (×2): 1 via NASAL
  Filled 2021-04-09: qty 30

## 2021-04-09 NOTE — Progress Notes (Signed)
Physical Therapy Treatment Patient Details Name: SHATONA ANDUJAR MRN: [id number] DOB: 1955/11/02 Today's Date: 04/09/2021    History of Present Illness 65 yo female presenting to ED with fatigue and a fever on 7/25.  Also, reporting of left-sided flank pain. Admitted with UTI and hyponatremia. 04/03/21 MRI lumbar spine-joint effusion left L4-5 facet (arthritis vs septic arthritis); mass effect on left L5 nerve root; edema left>right lower paraspinals suggest myositis; small disc protrusions L3-4 and L4-5 potentially effecting the left L3 and L4 nerve roots. Pt developed incr pain and edema in rt knee 7/29-7/30 and underwent open arthotomy and debridement of rt knee for septic arthritis  on 7/30.  PMH including obesity, Eagle syndrome, elevated CRP, HTN, GERD, asthma, tobacco use, COPD, DM2, HLD, and osteoarthritis.    PT Comments    Patient received in bed, she is agreeable to PT session. Husband at bedside. Patient reports she had a rough night, pain in knee about the same as yesterday. Requires increased time with all mobility due to severe pain in R LE. Needs explanation about what you are going to do before you do it. Donned KI prior to bed mobility with pain, but once on, it seemed beneficial in supporting the knee with mobility. Patient was able to pivot transfer to bsc with mod +2 assist. She will continue to benefit from skilled PT while here to improve mobility, independence and safety for eventual return home with husband.  CIR continues to be appropriate as she is motivated to improve and has good support.         Follow Up Recommendations  CIR     Equipment Recommendations  Other (comment) (TBD will likely need RW, BSC and possibly WC if mobility does not improve to walking)    Recommendations for Other Services Rehab consult     Precautions / Restrictions Precautions Precautions: Fall Precaution Comments: high fall Restrictions RLE Weight Bearing: Weight bearing as tolerated     Mobility  Bed Mobility Overal bed mobility: Needs Assistance Bed Mobility: Supine to Sit;Sit to Supine     Supine to sit: HOB elevated;Mod assist;+2 for physical assistance Sit to supine: Mod assist   General bed mobility comments: Patient continues to require assist from husband to manage R LE with all mobility. She is very pain limited. Requires notice and prep prior to movement.  Mod assist for R LE and mod assist to raise trunk to seated position. Able to scoot self up in bed in supine.    Transfers Overall transfer level: Needs assistance Equipment used: None Transfers: Squat Pivot Transfers     Squat pivot transfers: Mod assist;+2 physical assistance     General transfer comment: Performed squat pivot bed><BSC assist needed to manage R LE throughout transfer.  Ambulation/Gait                 Stairs             Wheelchair Mobility    Modified Rankin (Stroke Patients Only)       Balance Overall balance assessment: Needs assistance Sitting-balance support: Feet supported Sitting balance-Leahy Scale: Good     Standing balance support: Bilateral upper extremity supported;During functional activity Standing balance-Leahy Scale: Fair Standing balance comment: requires mod assist +2 for pivot transfer to [facility]                            Cognition Arousal/Alertness: Awake/alert Behavior During Therapy: [facility] for tasks assessed/performed Overall  Cognitive Status: Within Functional Limits for tasks assessed                               Problem Solving: Requires verbal cues;Requires tactile cues        Exercises      General Comments        Pertinent Vitals/Pain Pain Assessment: Faces Faces Pain Scale: Hurts whole lot Pain Location: rt knee with any movement Pain Descriptors / Indicators: Guarding;Grimacing;Discomfort;Sore Pain Intervention(s): Monitored during session;Repositioned;Limited activity within patient's  tolerance    Home Living                      Prior Function            PT Goals (current goals can now be found in the care plan section) Acute Rehab PT Goals Patient Stated Goal: get back to PLOF PT Goal Formulation: With patient/family Time For Goal Achievement: 04/20/21 Potential to Achieve Goals: Good Progress towards PT goals: Progressing toward goals    Frequency    Min 3X/week      PT Plan Current plan remains appropriate    Co-evaluation              AM-PAC PT "6 Clicks" Mobility   Outcome Measure  Help needed turning from your back to your side while in a flat bed without using bedrails?: A Little Help needed moving from lying on your back to sitting on the side of a flat bed without using bedrails?: A Lot Help needed moving to and from a bed to a chair (including a wheelchair)?: A Lot Help needed standing up from a chair using your arms (e.g., wheelchair or bedside chair)?: A Lot Help needed to walk in hospital room?: Total Help needed climbing 3-5 steps with a railing? : Total 6 Click Score: 11    End of Session Equipment Utilized During Treatment: Gait belt Activity Tolerance: Patient limited by pain;Patient limited by fatigue Patient left: in bed;with call bell/phone within reach;with family/visitor present Nurse Communication: Mobility status PT Visit Diagnosis: Other abnormalities of gait and mobility (R26.89);Pain;Difficulty in walking, not elsewhere classified (R26.2) Pain - Right/Left: Right Pain - part of body: Knee     Time: 1335-1417 PT Time Calculation (min) (ACUTE ONLY): 42 min  Charges:  $Therapeutic Activity: 38-52 mins                    Zafira Munos, PT, GCS 04/09/21,2:45 PM

## 2021-04-09 NOTE — Progress Notes (Signed)
PROGRESS NOTE  [name]  ZOX:[id number] DOB: [birth year] DOA: 03/30/2021 PCP: Gordan Payment., MD   Brief Narrative: Katie Serrano is a 65 y.o. female with a history of HTN, asthma, tobacco use, COPD, T2DM, HLD, OA, and GERD who presented to the ED with fever to 105F and malaise found to have sepsis due to MRSA UTI and bacteremia for which vancomycin has been given. TTE was negative 7/27, subsequent TEE negative 7/29. Due to R > L LE weakness, spinal MR was performed and revealed L4-5 facet septic arthritis, several fluiid collections and mass effect on L5 nerve and myositis near L4-5. Neurosurgery was consulted but did not recommend surgical intervention at this time. Brain MRI was obtained 04/03/21 and showed scattered infarcts involving the left caudate head, left parietal lobe, left splenium, and right temporal lobe. It also showed diffuse leptomeningeal enhancement concerning for meningitis. Neurology was consulted. The patient's right knee has developed painful swelling acutely on 7/30 for which arthrocentesis and subsequent arthrotomy and drainage was performed.    Plan currently is to continue IV vancomycin for 8 weeks. Will pursue rehabilitation.  Assessment & Plan: Principal Problem:   MRSA bacteremia Active Problems:   Class 1 obesity due to excess calories with serious comorbidity and body mass index (BMI) of 30.0 to 30.9 in adult   Essential hypertension   Mixed hyperlipidemia   Coronary artery disease of native artery of native heart with stable angina pectoris (HCC)   SIRS (systemic inflammatory response syndrome) (HCC)   Type 2 diabetes mellitus with hyperglycemia (HCC)   Hyponatremia   Acute pyelonephritis   Epidural abscess   Weakness of both lower extremities   Septic embolism (HCC)   Acute bacterial endocarditis   Cerebral embolism with cerebral infarction   Acute pain of right knee   Rash   Vertebral osteophyte  Sepsis due to MRSA pyelonephritis, bacteremia  with acute facet arthritis, septic arthritis, paraspinal myositis: TTE and subsequent TEE negative for vegetation, negative bubble study.  - Repeat blood cultures 7/27-7/28 remained negative (final), PICC inserted 8/2 in RUE brachial vein to continue vancomycin x8 weeks per ID.  Right knee septic arthritis: Cultures are growing MRSA as expected despite no organisms on gram stain.  - Abx as above.  - PT/OT, WBAT. CIR discussing rehab with the patient in further detail today.  - Continue lovenox for DVT ppx - Continue oxycodone prn pain - Knee immobilizer ordered. Currently pain is limiting this.  - Still has drain, not significant output. Will defer management to orthopedics. D/w Charma Igo who will get back to me. I appreciate it.  Constipation: Severe. Had BM 7/31. - Continue senna 2 tab BID, miralax daily, colace BID. Had significant BM 8/2 with suppository. Will continue regular regimen now.  Pustular lesions: Typical of MRSA boils consistent with patient's bacteremia. Improved.  B/L Neuropathic and cramping lower extremity pain: MR with concern for septic arthritis/acute facet arthritis, mass-effect of L5 nerve root, and marrow edema in the paraspinous musculature concerning for acute myositis. - Neurosurgery consulted, recommending against surgery/instrumentation. Symptoms improving.   Hyponatremia, suspect hypovolemia: Resolved.   Essential hypertension - Increased norvasc to  - Continue losartan 50 mg bedtime - Increased metoprolol 25 mg BID >  BID. Improved overall. If elevated, could increase hydralazine dose/frequency. - Continue IV hydralazine as needed    Mixed hyperlipidemia: LDL 44. - Statin stopped due to her allergy and concerns. Continue lovaza   CAD: No angina.  - Continue BB, lovaza, ASA  81mg .    Uncontrolled T2DM with hyperglycemia:  - Tight glycemic control to decrease mortality with bacteremia. Hold metformin.  - Further increased lantus to 20u,  continue moderate SSI, increased novolog to [facility]. Will adjust today based on needs.  Sore throat: Most likely sequela of intubation during surgery. Also possibly PND component. Resolved.   DVT prophylaxis: Lovenox Code Status: Full Family Communication: None at bedside this AM. Disposition Plan:  Status is: Inpatient  Remains inpatient appropriate because:Ongoing diagnostic testing needed not appropriate for outpatient work up and Inpatient level of care appropriate due to severity of illness  Dispo: The patient is from: Home              Anticipated d/c is to: Home vs. CIR              Patient currently is not medically stable to d/c.   Difficult to place patient No  Consultants:  Neurosurgery ID Neurology Orthopedic surgery PM&R  Procedures:  TEE Right knee bedside arthrocentesis then arthrotomy and drainage, drain placement 04/05/2021 Dr. 04/07/2021.   Antimicrobials: Vancomycin   Subjective: Pain limiting knee immobilizer and PT efforts yesterday but eager to move frequently. Eager to work again today and get to [facility]. No fevers, no new rashes. Sore throat resolved. No dyspnea or chest pain reported. Movement in legs improving overall. Pain controlled.  Objective: Vitals:   04/08/21 1200 04/08/21 1707 04/08/21 2248 04/09/21 0608  BP: (!) 142/73 134/72 139/70 (!) 153/71  Pulse: 67 70 77 73  Resp: 18 18 18 18   Temp: 98.5 F (36.9 C) 99.2 F (37.3 C) 98.9 F (37.2 C) 98.2 F (36.8 C)  TempSrc: Oral Oral  Oral  SpO2: 98% 95% 93% 94%  Weight:      Height:        Intake/Output Summary (Last 24 hours) at 04/09/2021 1027 Last data filed at 04/09/2021 06/09/2021 Gross per 24 hour  Intake 1687.5 ml  Output 2700 ml  Net -1012.5 ml   Filed Weights   03/30/21 2345 04/05/21 1336  Weight: 83.9 kg 80.7 kg   Gen: 65 y.o. female in no distress Pulm: Nonlabored breathing room air. Clear. CV: Regular rate and rhythm. No murmur, rub, or gallop. No JVD, no dependent edema. GI:  Abdomen soft, non-tender, not at all distended, with normoactive bowel sounds.  Ext: Warm, no deformities Skin: Right knee dressing c/d/I without spreading erythema or discharge. Appropriately tender there and in suprapatellar region without fluctuance or induration. No popliteal cyst palpated. Scattered pustules resolving on exam.  Neuro: Alert and oriented. R > L leg/ankle weakness significantly improved, now limited by pain. No other focal neurological deficits. Psych: Judgement and insight appear fair. Mood euthymic & affect congruent. Behavior is appropriate.    Data Reviewed: I have personally reviewed following labs and imaging studies  CBC: Recent Labs  Lab 04/03/21 0015 04/04/21 0359 04/05/21 0322 04/05/21 1802 04/08/21 0430  WBC 22.8* 18.0* 19.7* 23.4* 18.3*  HGB 12.5 12.8 12.1 12.5 11.9*  HCT 36.2 37.0 36.4 36.7 35.6*  MCV 85.2 84.1 86.1 86.4 86.0  PLT 350 401* 447* 499* 493*   Basic Metabolic Panel: Recent Labs  Lab 04/03/21 0015 04/04/21 0359 04/05/21 0322 04/05/21 1802 04/08/21 0430  NA 133* 135 136  --  134*  K 3.3* 3.4* 3.3*  --  3.7  CL 101 101 98  --  97*  CO2 25 26 29   --  30  GLUCOSE 179* 221* 218*  --  217*  BUN --  5*  CREATININE 0.88 0.81 0.82 0.85 0.72  CALCIUM 8.7* 8.7* 8.6*  --  8.4*  MG 2.2 2.0 2.0  --   --    GFR: Estimated Creatinine Clearance: 75.1 mL/min (by C-G formula based on SCr of 0.72 mg/dL).  CBG: Recent Labs  Lab 04/08/21 0556 04/08/21 1110 04/08/21 1556 04/08/21 2105 04/09/21 0605  GLUCAP 225* 204* 169* 117* 204*    Urine analysis:    Component Value Date/Time   COLORURINE YELLOW 03/31/2021 0239   APPEARANCEUR CLOUDY (A) 03/31/2021 0239   LABSPEC 1.009 03/31/2021 0239   PHURINE 5.0 03/31/2021 0239   GLUCOSEU >=500 (A) 03/31/2021 0239   HGBUR SMALL (A) 03/31/2021 0239   BILIRUBINUR NEGATIVE 03/31/2021 0239   KETONESUR 20 (A) 03/31/2021 0239   PROTEINUR 100 (A) 03/31/2021 0239   NITRITE NEGATIVE  03/31/2021 0239   LEUKOCYTESUR MODERATE (A) 03/31/2021 0239   Recent Results (from the past 240 hour(s))  Resp Panel by RT-PCR (Flu A&B, Covid) Nasopharyngeal Swab     Status: None   Collection Time: 03/31/21 12:21 AM   Specimen: Nasopharyngeal Swab; Nasopharyngeal(NP) swabs in vial transport medium  Result Value Ref Range Status   SARS Coronavirus 2 by RT PCR NEGATIVE NEGATIVE Final    Comment: (NOTE) SARS-CoV-2 target nucleic acids are NOT DETECTED.  The SARS-CoV-2 RNA is generally detectable in upper respiratory specimens during the acute phase of infection. The lowest concentration of SARS-CoV-2 viral copies this assay can detect is 138 copies/mL. A negative result does not preclude SARS-Cov-2 infection and should not be used as the sole basis for treatment or other patient management decisions. A negative result may occur with  improper specimen collection/handling, submission of specimen other than nasopharyngeal swab, presence of viral mutation(s) within the areas targeted by this assay, and inadequate number of viral copies(<138 copies/mL). A negative result must be combined with clinical observations, patient history, and epidemiological information. The expected result is Negative.  Fact Sheet for Patients:  BloggerCourse.com  Fact Sheet for Healthcare Providers:  SeriousBroker.it  This test is no t yet approved or cleared by the [place] FDA and  has been authorized for detection and/or diagnosis of SARS-CoV-2 by FDA under an Emergency Use Authorization (EUA). This EUA will remain  in effect (meaning this test can be used) for the duration of the COVID-19 declaration under Section 564(b)(1) of the Act, 21 U.S.C.section 360bbb-3(b)(1), unless the authorization is terminated  or revoked sooner.       Influenza A by PCR NEGATIVE NEGATIVE Final   Influenza B by PCR NEGATIVE NEGATIVE Final    Comment: (NOTE) The  Xpert Xpress SARS-CoV-2/FLU/RSV plus assay is intended as an aid in the diagnosis of influenza from Nasopharyngeal swab specimens and should not be used as a sole basis for treatment. Nasal washings and aspirates are unacceptable for Xpert Xpress SARS-CoV-2/FLU/RSV testing.  Fact Sheet for Patients: BloggerCourse.com  Fact Sheet for Healthcare Providers: SeriousBroker.it  This test is not yet approved or cleared by the [place] FDA and has been authorized for detection and/or diagnosis of SARS-CoV-2 by FDA under an Emergency Use Authorization (EUA). This EUA will remain in effect (meaning this test can be used) for the duration of the COVID-19 declaration under Section 564(b)(1) of the Act, 21 U.S.C. section 360bbb-3(b)(1), unless the authorization is terminated or revoked.  Performed at [facility], [address] [place], [place] [address]   Urine Culture  Status: Abnormal   Collection Time: 03/31/21  5:51 AM   Specimen: Urine, Clean Catch  Result Value Ref Range Status   Specimen Description URINE, CLEAN CATCH  Final   Special Requests NONE  Final   Culture (A)  Final    >=100,000 COLONIES/mL METHICILLIN RESISTANT STAPHYLOCOCCUS AUREUS 40,000 COLONIES/mL AEROCOCCUS SPECIES Standardized susceptibility testing for this organism is not available. Performed at [facility], [address], [place], [place] [address]    Report Status 04/02/2021 FINAL  Final   Organism ID, Bacteria METHICILLIN RESISTANT STAPHYLOCOCCUS AUREUS (A)  Final      Susceptibility   Methicillin resistant staphylococcus aureus - MIC*    CIPROFLOXACIN >=8 RESISTANT Resistant     GENTAMICIN <=0.5 SENSITIVE Sensitive     NITROFURANTOIN <=16 SENSITIVE Sensitive     OXACILLIN >=4 RESISTANT Resistant     TETRACYCLINE <=1 SENSITIVE Sensitive     VANCOMYCIN 1 SENSITIVE Sensitive     TRIMETH/SULFA <=10 SENSITIVE Sensitive     CLINDAMYCIN  <=0.25 SENSITIVE Sensitive     RIFAMPIN <=0.5 SENSITIVE Sensitive     Inducible Clindamycin NEGATIVE Sensitive     * >=100,000 COLONIES/mL METHICILLIN RESISTANT STAPHYLOCOCCUS AUREUS  Culture, blood (Routine X 2) w Reflex to ID Panel     Status: Abnormal   Collection Time: 04/01/21 11:08 AM   Specimen: BLOOD  Result Value Ref Range Status   Specimen Description BLOOD LEFT ANTECUBITAL  Final   Special Requests   Final    BOTTLES DRAWN AEROBIC AND ANAEROBIC Blood Culture adequate volume   Culture  Setup Time   Final    GRAM POSITIVE COCCI IN CLUSTERS IN BOTH AEROBIC AND ANAEROBIC BOTTLES Organism ID to follow CRITICAL RESULT CALLED TO, READ BACK BY AND VERIFIED WITH: PARMD MIRANDA BRYK BY MESSAN H. AT 0552 02 7 27 2022 Performed at [facility], [address] [place], [place] [address]    Culture METHICILLIN RESISTANT STAPHYLOCOCCUS AUREUS (A)  Final   Report Status 04/04/2021 FINAL  Final   Organism ID, Bacteria METHICILLIN RESISTANT STAPHYLOCOCCUS AUREUS  Final      Susceptibility   Methicillin resistant staphylococcus aureus - MIC*    CIPROFLOXACIN >=8 RESISTANT Resistant     ERYTHROMYCIN >=8 RESISTANT Resistant     GENTAMICIN <=0.5 SENSITIVE Sensitive     OXACILLIN >=4 RESISTANT Resistant     TETRACYCLINE <=1 SENSITIVE Sensitive     VANCOMYCIN 1 SENSITIVE Sensitive     TRIMETH/SULFA <=10 SENSITIVE Sensitive     CLINDAMYCIN <=0.25 SENSITIVE Sensitive     RIFAMPIN <=0.5 SENSITIVE Sensitive     Inducible Clindamycin NEGATIVE Sensitive     * METHICILLIN RESISTANT STAPHYLOCOCCUS AUREUS  Blood Culture ID Panel (Reflexed)     Status: Abnormal   Collection Time: 04/01/21 11:08 AM  Result Value Ref Range Status   Enterococcus faecalis NOT DETECTED NOT DETECTED Final   Enterococcus Faecium NOT DETECTED NOT DETECTED Final   Listeria monocytogenes NOT DETECTED NOT DETECTED Final   Staphylococcus species DETECTED (A) NOT DETECTED Final    Comment: CRITICAL RESULT CALLED TO, READ  BACK BY AND VERIFIED WITH: PARMD MIRANDA BRYK BY MESSAN H. AT 0552 02 7 27 2022    Staphylococcus aureus (BCID) DETECTED (A) NOT DETECTED Final    Comment: Methicillin (oxacillin)-resistant Staphylococcus aureus (MRSA). MRSA is predictably resistant to beta-lactam antibiotics (except ceftaroline). Preferred therapy is vancomycin unless clinically contraindicated. Patient requires contact precautions if  hospitalized. CRITICAL RESULT CALLED TO, READ BACK BY AND  VERIFIED WITH: PARMD MIRANDA BRYK BY MESSAN H. AT 0552 02 7 27 2022    Staphylococcus epidermidis NOT DETECTED NOT DETECTED Final   Staphylococcus lugdunensis NOT DETECTED NOT DETECTED Final   Streptococcus species NOT DETECTED NOT DETECTED Final   Streptococcus agalactiae NOT DETECTED NOT DETECTED Final   Streptococcus pneumoniae NOT DETECTED NOT DETECTED Final   Streptococcus pyogenes NOT DETECTED NOT DETECTED Final   A.calcoaceticus-baumannii NOT DETECTED NOT DETECTED Final   Bacteroides fragilis NOT DETECTED NOT DETECTED Final   Enterobacterales NOT DETECTED NOT DETECTED Final   Enterobacter cloacae complex NOT DETECTED NOT DETECTED Final   Escherichia coli NOT DETECTED NOT DETECTED Final   Klebsiella aerogenes NOT DETECTED NOT DETECTED Final   Klebsiella oxytoca NOT DETECTED NOT DETECTED Final   Klebsiella pneumoniae NOT DETECTED NOT DETECTED Final   Proteus species NOT DETECTED NOT DETECTED Final   Salmonella species NOT DETECTED NOT DETECTED Final   Serratia marcescens NOT DETECTED NOT DETECTED Final   Haemophilus influenzae NOT DETECTED NOT DETECTED Final   Neisseria meningitidis NOT DETECTED NOT DETECTED Final   Pseudomonas aeruginosa NOT DETECTED NOT DETECTED Final   Stenotrophomonas maltophilia NOT DETECTED NOT DETECTED Final   Candida albicans NOT DETECTED NOT DETECTED Final   Candida auris NOT DETECTED NOT DETECTED Final   Candida glabrata NOT DETECTED NOT DETECTED Final   Candida krusei NOT DETECTED NOT DETECTED  Final   Candida parapsilosis NOT DETECTED NOT DETECTED Final   Candida tropicalis NOT DETECTED NOT DETECTED Final   Cryptococcus neoformans/gattii NOT DETECTED NOT DETECTED Final   Meth resistant mecA/C and MREJ DETECTED (A) NOT DETECTED Final    Comment: CRITICAL RESULT CALLED TO, READ BACK BY AND VERIFIED WITH: PARMD MIRANDA BRYK BY MESSAN H. AT 0552 02 7 27 2022 Performed at [facility], [address], [place], [place] [address]   Culture, blood (Routine X 2) w Reflex to ID Panel     Status: Abnormal   Collection Time: 04/01/21 11:18 AM   Specimen: BLOOD RIGHT HAND  Result Value Ref Range Status   Specimen Description BLOOD RIGHT HAND  Final   Special Requests   Final    BOTTLES DRAWN AEROBIC ONLY Blood Culture adequate volume   Culture  Setup Time   Final    GRAM POSITIVE COCCI IN CLUSTERS AEROBIC BOTTLE ONLY CRITICAL VALUE NOTED.  VALUE IS CONSISTENT WITH PREVIOUSLY REPORTED AND CALLED VALUE.    Culture (A)  Final    STAPHYLOCOCCUS AUREUS SUSCEPTIBILITIES PERFORMED ON PREVIOUS CULTURE WITHIN THE LAST 5 DAYS. Performed at [facility], [address] [place], [place] [address]    Report Status 04/04/2021 FINAL  Final  Culture, blood (Routine X 2) w Reflex to ID Panel     Status: None   Collection Time: 04/02/21 11:50 PM   Specimen: BLOOD RIGHT HAND  Result Value Ref Range Status   Specimen Description BLOOD RIGHT HAND  Final   Special Requests   Final    BOTTLES DRAWN AEROBIC ONLY Blood Culture adequate volume   Culture   Final    NO GROWTH 5 DAYS Performed at [facility], [address], [place], [place] [address]    Report Status 04/08/2021 FINAL  Final  Culture, blood (Routine X 2) w Reflex to ID Panel     Status: None   Collection Time: 04/03/21 12:09 AM   Specimen: BLOOD LEFT HAND  Result Value Ref Range Status   Specimen Description BLOOD LEFT HAND  Final   Special Requests   Final    BOTTLES DRAWN AEROBIC ONLY Blood Culture adequate volume    Culture   Final    NO GROWTH 5 DAYS Performed at [facility], [address], [place], [place] [address]    Report Status 04/08/2021 FINAL  Final  Body fluid culture w Gram Stain     Status: None   Collection Time: 04/05/21 10:52 AM   Specimen: Synovium; Synovial Fluid  Result Value Ref Range Status   Specimen Description SYNOVIAL FLUID  Final   Special Requests RIGHT KNEE  Final   Gram Stain   Final    ABUNDANT WBC PRESENT, PREDOMINANTLY PMN NO ORGANISMS SEEN Gram Stain Report Called to,Read Back By and Verified With: SWINTEK MD @1425  04/05/21 EB      Culture   Final    RARE METHICILLIN RESISTANT STAPHYLOCOCCUS AUREUS CRITICAL RESULT CALLED TO, READ BACK BY AND VERIFIED WITH: RN H.MCINTOSH AT 1024 ON 04/07/2021 BY T.SAAD. Performed at [facility], [address] [place], [place]    Report Status 04/08/2021 FINAL  Final   Organism ID, Bacteria METHICILLIN RESISTANT STAPHYLOCOCCUS AUREUS  Final      Susceptibility   Methicillin resistant staphylococcus aureus - MIC*    CIPROFLOXACIN >=8 RESISTANT Resistant     ERYTHROMYCIN >=8 RESISTANT Resistant     GENTAMICIN <=0.5 SENSITIVE Sensitive     OXACILLIN >=4 RESISTANT Resistant     TETRACYCLINE <=1 SENSITIVE Sensitive     VANCOMYCIN <=0.5 SENSITIVE Sensitive     TRIMETH/SULFA <=10 SENSITIVE Sensitive     CLINDAMYCIN <=0.25 SENSITIVE Sensitive     RIFAMPIN <=0.5 SENSITIVE Sensitive     Inducible Clindamycin NEGATIVE Sensitive     * RARE METHICILLIN RESISTANT STAPHYLOCOCCUS AUREUS  Anaerobic culture w Gram Stain     Status: None (Preliminary result)   Collection Time: 04/05/21 10:52 AM   Specimen: Synovium; Synovial Fluid  Result Value Ref Range Status   Specimen Description SYNOVIAL FLUID  Final   Special Requests RIGHT KNEE  Final   Gram Stain   Final    RARE WBC PRESENT,BOTH PMN AND MONONUCLEAR RARE GRAM POSITIVE COCCI IN PAIRS Performed at [facility], [address], [place], [place]  [place]    Culture   Final    NO ANAEROBES ISOLATED; CULTURE IN PROGRESS FOR 5 DAYS   Report Status PENDING  Incomplete  Aerobic/Anaerobic Culture w Gram Stain (surgical/deep wound)     Status: None (Preliminary result)   Collection Time: 04/05/21  4:07 PM   Specimen: Tissue  Result Value Ref Range Status   Specimen Description TISSUE  Final   Special Requests RIGHT KNEE SYNOVIUM SPEC A  Final   Gram Stain   Final    RARE WBC PRESENT, PREDOMINANTLY MONONUCLEAR NO ORGANISMS SEEN Performed at [facility], [address] [place], [place]    Culture   Final    RARE METHICILLIN RESISTANT STAPHYLOCOCCUS AUREUS NO ANAEROBES ISOLATED; CULTURE IN PROGRESS FOR 5 DAYS    Report Status PENDING  Incomplete   Organism ID, Bacteria METHICILLIN RESISTANT STAPHYLOCOCCUS AUREUS  Final      Susceptibility   Methicillin resistant staphylococcus aureus - MIC*    CIPROFLOXACIN >=8 RESISTANT Resistant     ERYTHROMYCIN >=8 RESISTANT Resistant     GENTAMICIN <=0.5 SENSITIVE Sensitive     OXACILLIN >=4 RESISTANT Resistant     TETRACYCLINE <=1 SENSITIVE Sensitive  VANCOMYCIN 1 SENSITIVE Sensitive     TRIMETH/SULFA <=10 SENSITIVE Sensitive     CLINDAMYCIN <=0.25 SENSITIVE Sensitive     RIFAMPIN <=0.5 SENSITIVE Sensitive     Inducible Clindamycin NEGATIVE Sensitive     * RARE METHICILLIN RESISTANT STAPHYLOCOCCUS AUREUS      Radiology Studies: US EKG SITE RITE  Result Date: 04/08/2021 If Site Rite image not attached, placement could not be confirmed due to current cardiac rhythm.   Scheduled Meds:  amLODipine  10 mg Oral Daily   Chlorhexidine Gluconate Cloth  6 each Topical Daily   docusate sodium  100 mg Oral BID   enoxaparin (LOVENOX) injection  40 mg Subcutaneous Q24H   insulin aspart  0-15 Units Subcutaneous TID WC   insulin aspart  0-5 Units Subcutaneous QHS   insulin aspart  7 Units Subcutaneous TID WC   insulin glargine-yfgn  20 Units Subcutaneous Daily   ipratropium  1  spray Each Nare BID   losartan  50 mg Oral QHS   metoprolol tartrate  50 mg Oral BID   omega-3 acid ethyl esters  1 g Oral Daily   polyethylene glycol  17 g Oral Daily   senna  2 tablet Oral BID   Continuous Infusions:  vancomycin 750 mg (04/09/21 0646)     LOS: 8 days   Time spent: 35 minutes.  Tyrone Nineyan B Tryce Surratt, MD [facility] [email or web address] 04/09/2021, 10:27 AM

## 2021-04-09 NOTE — Progress Notes (Signed)
Occupational Therapy Treatment Patient Details Name: EVANGELINA DELANCEY MRN: [id number] DOB: 1956/01/27 Today's Date: 04/09/2021    History of present illness 65 yo female presenting to ED with fatigue and a fever on 7/25.  Also, reporting of left-sided flank pain. Admitted with UTI and hyponatremia. 04/03/21 MRI lumbar spine-joint effusion left L4-5 facet (arthritis vs septic arthritis); mass effect on left L5 nerve root; edema left>right lower paraspinals suggest myositis; small disc protrusions L3-4 and L4-5 potentially effecting the left L3 and L4 nerve roots. Pt developed incr pain and edema in rt knee 7/29-7/30 and underwent open arthotomy and debridement of rt knee for septic arthritis  on 7/30.  PMH including obesity, Eagle syndrome, elevated CRP, HTN, GERD, asthma, tobacco use, COPD, DM2, HLD, and osteoarthritis.   OT comments  Pt is progressing well. She was anxious upon arrival however motivated to participate in grooming tasks while sitting EOB. Pt was minA for bed mobility with significantly increased time, and her husband managing her RLE. Pt groomed while sitting EOB with set up. Pt declined further mobility. She continues to benefit from acute OT. D/c plan updated to CIR.    Follow Up Recommendations  CIR    Equipment Recommendations  3 in 1 bedside commode;Other (comment)       Precautions / Restrictions Precautions Precautions: Fall Precaution Comments: high fall Restrictions Weight Bearing Restrictions: Yes RLE Weight Bearing: Weight bearing as tolerated       Mobility Bed Mobility Overal bed mobility: Needs Assistance Bed Mobility: Supine to Sit;Sit to Supine     Supine to sit: HOB elevated Sit to supine: Min assist   General bed mobility comments: min A for management of RLE, and one tug of the bed pad to adjust hips    Transfers Overall transfer level: Needs assistance Equipment used: None Transfers: Squat Pivot Transfers     Squat pivot transfers: Mod  assist;+2 physical assistance     General transfer comment: pt declined this session    Balance Overall balance assessment: Needs assistance Sitting-balance support: Feet supported Sitting balance-Leahy Scale: Good     Standing balance support: Bilateral upper extremity supported;During functional activity Standing balance-Leahy Scale: Fair Standing balance comment: requires mod assist +2 for pivot transfer to [facility]                           ADL either performed or assessed with clinical judgement   ADL Overall ADL's : Needs assistance/impaired     Grooming: Wash/dry face;Oral care;Brushing hair Grooming Details (indicate cue type and reason): pt completed grooming tasks while sitting EOB wtih R foot supported on a chair                             Functional mobility during ADLs: Minimal assistance (limited to bed) General ADL Comments: Pt able to get to EOB this session wtih incrased time, rest breaks for pain managment and her husband assisting her RLE      Cognition Arousal/Alertness: Awake/alert Behavior During Therapy: WFL for tasks assessed/performed Overall Cognitive Status: Within Functional Limits for tasks assessed                               Problem Solving: Requires verbal cues;Requires tactile cues General Comments: anxious but motivated today              General  Comments pt eager to move to EOB to groom this session & wanted to stay sitting up; RN present and aware of plan. Husband demonstrated great ability to assist pt from EOB>supine    Pertinent Vitals/ Pain       Pain Assessment: Faces Faces Pain Scale: Hurts even more Pain Location: rt knee with any movement Pain Descriptors / Indicators: Guarding;Grimacing;Discomfort;Sore Pain Intervention(s): Limited activity within patient's tolerance;Monitored during session   Frequency  Min 2X/week        Progress Toward Goals  OT Goals(current goals can now  be found in the care plan section)  Progress towards OT goals: Progressing toward goals  Acute Rehab OT Goals Patient Stated Goal: get back to PLOF OT Goal Formulation: With patient Time For Goal Achievement: 04/20/21 Potential to Achieve Goals: Good ADL Goals Pt Will Perform Grooming: with modified independence;standing Pt Will Perform Upper Body Dressing: with modified independence;sitting Pt Will Perform Lower Body Dressing: with modified independence;sit to/from stand Pt Will Transfer to Toilet: with modified independence;ambulating Pt Will Perform Toileting - Clothing Manipulation and hygiene: with modified independence;sit to/from stand Pt Will Perform Tub/Shower Transfer: with modified independence;ambulating;shower seat;grab bars;rolling walker Additional ADL Goal #1: Pt will improve activity tolerance by performing 3 consecutive ADLs in standing  Plan Discharge plan needs to be updated       AM-PAC OT "6 Clicks" Daily Activity     Outcome Measure   Help from another person eating meals?: None Help from another person taking care of personal grooming?: A Little Help from another person toileting, which includes using toliet, bedpan, or urinal?: A Lot Help from another person bathing (including washing, rinsing, drying)?: A Lot Help from another person to put on and taking off regular upper body clothing?: A Little Help from another person to put on and taking off regular lower body clothing?: A Lot 6 Click Score: 16    End of Session    OT Visit Diagnosis: Unsteadiness on feet (R26.81);Muscle weakness (generalized) (M62.81);Pain Pain - Right/Left: Right Pain - part of body: Knee   Activity Tolerance Patient tolerated treatment well   Patient Left in bed;with call bell/phone within reach;with family/visitor present   Nurse Communication Mobility status;Precautions;Weight bearing status        Time: 1555-1620 OT Time Calculation (min): 25 min  Charges: OT  General Charges $OT Visit: 1 Visit OT Treatments $Self Care/Home Management : 23-37 mins     Zach Tietje A Raciel Caffrey 04/09/2021, 5:23 PM

## 2021-04-09 NOTE — Progress Notes (Signed)
Inpatient Diabetes Program Recommendations  AACE/ADA: New Consensus Statement on Inpatient Glycemic Control (2015)  Target Ranges:  Prepandial:   less than 140 mg/dL      Peak postprandial:   less than 180 mg/dL (1-2 hours)      Critically ill patients:  140 - 180 mg/dL   Lab Results  Component Value Date   GLUCAP 243 (H) 04/09/2021   HGBA1C 9.8 (H) 03/31/2021    Review of Glycemic Control  Current orders for Inpatient glycemic control:   Semglee 20 units QD, Novolog 0-15 units TID with meals and 0-5 HS + 7 units TID  CBGs today: 204, 352, 243 mg/dL.  Inpatient Diabetes Program Recommendations:    Spoke with pt and husband at bedside regarding HgbA1C of 9.8%.  Pt had previously stated she did not want to be discharged on insulin. Discussed importance of good glycemic control to prevent long and short term complications. Pt states she is going to CIR today and she will think about it while in Rehab.  Continue to titrate both Semglee and Novolog.  Thank you. Ailene Ards, RD, LDN, CDE Inpatient Diabetes Coordinator [phone]

## 2021-04-09 NOTE — Progress Notes (Signed)
Inpatient Rehab Admissions Coordinator:   Met with patient and her spouse at the bedside, pt working with PT.  Plan for possible admit to CIR tomorrow pending bed availability.  Pt on board. I will f/u with her tomorrow.   Shann Medal, PT, DPT Admissions Coordinator [phone] 04/09/21  2:56 PM

## 2021-04-09 NOTE — Progress Notes (Addendum)
[facility]    Date of Admission:  03/30/2021      ID: Katie Serrano is a 65 y.o. female with disseminated MRSA infection Principal Problem:   MRSA bacteremia Active Problems:   Class 1 obesity due to excess calories with serious comorbidity and body mass index (BMI) of 30.0 to 30.9 in adult   Essential hypertension   Mixed hyperlipidemia   Coronary artery disease of native artery of native heart with stable angina pectoris (HCC)   SIRS (systemic inflammatory response syndrome) (HCC)   Type 2 diabetes mellitus with hyperglycemia (HCC)   Hyponatremia   Acute pyelonephritis   Epidural abscess   Weakness of both lower extremities   Septic embolism (HCC)   Acute bacterial endocarditis   Cerebral embolism with cerebral infarction   Acute pain of right knee   Rash   Vertebral osteophyte    Subjective: No difficulty with picc line placement yesterday. Having some pain to distal thigh/superior to knee joint "feels like a deep bruise" reports poor sleep. Drain still in place  to right knee  Medications:   amLODipine  10 mg Oral Daily   Chlorhexidine Gluconate Cloth  6 each Topical Daily   docusate sodium  100 mg Oral BID   enoxaparin (LOVENOX) injection  40 mg Subcutaneous Q24H   insulin aspart  0-15 Units Subcutaneous TID WC   insulin aspart  0-5 Units Subcutaneous QHS   insulin aspart  7 Units Subcutaneous TID WC   insulin glargine-yfgn  20 Units Subcutaneous Daily   ipratropium  1 spray Each Nare BID   losartan  50 mg Oral QHS   metoprolol tartrate  50 mg Oral BID   omega-3 acid ethyl esters  1 g Oral Daily   oxymetazoline  1 spray Each Nare BID   polyethylene glycol  17 g Oral Daily   senna  2 tablet Oral BID    Objective: Vital signs in last 24 hours: Temp:  [98 F (36.7 C)-99.2 F (37.3 C)] 98 F (36.7 C) (08/03 1231) Pulse Rate:  [66-77] 69 (08/03 1246) Resp:  [16-18] 16 (08/03 1231) BP: (123-153)/(63-72) 130/72 (08/03  1246) SpO2:  [93 %-100 %] 100 % (08/03 1246)   Physical Exam  Constitutional:  oriented to person, place, and time. appears well-developed and well-nourished. No distress.  HENT: L[place]/AT, PERRLA, no scleral icterus Mouth/Throat: Oropharynx is clear and moist. No oropharyngeal exudate.  Cardiovascular: Normal rate, regular rhythm and normal heart sounds. Exam reveals no gallop and no friction rub.  No murmur heard.  Pulmonary/Chest: Effort normal and breath sounds normal. No respiratory distress.  has no wheezes.  Neck = supple, no nuchal rigidity Abdominal: Soft. Bowel sounds are normal.  exhibits no distension. There is no tenderness.  Ext: right picc line is c/d/I. Right knee -drain in place tender to touch superiorly  no fluctuance or induration. Warmth to knee joint. Lymphadenopathy: no cervical adenopathy. No axillary adenopathy Neurological: alert and oriented to person, place, and time.  Skin: Skin is warm and dry. No rash noted. No erythema.  Psychiatric: a normal mood and affect.  behavior is normal.    Lab Results Recent Labs    04/08/21 0430  WBC 18.3*  HGB 11.9*  HCT 35.6*  NA 134*  K 3.7  CL 97*  CO2 30  BUN 5*  CREATININE 0.72   Liver Panel No results for input(s): PROT, ALBUMIN, AST, ALT, ALKPHOS, BILITOT, BILIDIR, IBILI in the last 72 hours.  Sedimentation Rate No results for input(s): ESRSEDRATE in the last 72 hours. C-Reactive Protein No results for input(s): CRP in the last 72 hours.  Microbiology:  Studies/Results: [place] EKG SITE RITE  Result Date: 04/08/2021 If Site Rite image not attached, placement could not be confirmed due to current cardiac rhythm.    Assessment/Plan: Disseminated mrsa infection including bacteremia, cns septic emboli, and right native knee septic arthritis s/p I x D +/- possible lumbar discitis. Plan to treat for 6 wk of IV abtx  since debridement of 7/30, and then extend to oral abtx if needed depending on response to  therapy.  - ortho to evaluate when to remove drain - will place opat order in for end date and weekly labs - will get sed rate and crp  Leukocytosis = steadily trending down  Hyperglycemia, = insulin dosing may need to be adjusted for BS in 200s  [facility] Cell: [phone] Pager: [phone]  04/09/2021, 4:02 PM

## 2021-04-09 NOTE — Progress Notes (Signed)
Ortho PA at bedside. Hemovac RLE removed by PA, pt tolerated well. PA informs pt she may shower as tolerated. PA instructed pt not to submerge RLE in water. PA informs pt foam dressing does not need to be replaced if it becomes soiled wet or loose adherence to skin. Pt husband at bedside

## 2021-04-09 NOTE — Plan of Care (Signed)
  Problem: Health Behavior/Discharge Planning: Goal: Ability to manage health-related needs will improve Outcome: Progressing   Problem: Clinical Measurements: Goal: Diagnostic test results will improve Outcome: Progressing   

## 2021-04-10 ENCOUNTER — Ambulatory Visit: Payer: Medicare Other | Admitting: Cardiology

## 2021-04-10 ENCOUNTER — Inpatient Hospital Stay (HOSPITAL_COMMUNITY)
Admission: RE | Admit: 2021-04-10 | Discharge: 2021-04-18 | DRG: 945 | Disposition: A | Discharge status: Home Health | Payer: Medicare Other | Source: Intra-hospital | Attending: Physical Medicine & Rehabilitation | Admitting: Physical Medicine & Rehabilitation

## 2021-04-10 DIAGNOSIS — M00061 Staphylococcal arthritis, right knee: Secondary | ICD-10-CM | POA: Diagnosis present

## 2021-04-10 DIAGNOSIS — I2510 Atherosclerotic heart disease of native coronary artery without angina pectoris: Secondary | ICD-10-CM | POA: Diagnosis present

## 2021-04-10 DIAGNOSIS — E871 Hypo-osmolality and hyponatremia: Secondary | ICD-10-CM | POA: Diagnosis present

## 2021-04-10 DIAGNOSIS — R5381 Other malaise: Secondary | ICD-10-CM | POA: Diagnosis present

## 2021-04-10 DIAGNOSIS — K5900 Constipation, unspecified: Secondary | ICD-10-CM | POA: Diagnosis present

## 2021-04-10 DIAGNOSIS — R7881 Bacteremia: Secondary | ICD-10-CM | POA: Diagnosis present

## 2021-04-10 DIAGNOSIS — G8918 Other acute postprocedural pain: Secondary | ICD-10-CM | POA: Diagnosis not present

## 2021-04-10 DIAGNOSIS — F17210 Nicotine dependence, cigarettes, uncomplicated: Secondary | ICD-10-CM | POA: Diagnosis present

## 2021-04-10 DIAGNOSIS — Z7984 Long term (current) use of oral hypoglycemic drugs: Secondary | ICD-10-CM | POA: Diagnosis not present

## 2021-04-10 DIAGNOSIS — M159 Polyosteoarthritis, unspecified: Secondary | ICD-10-CM | POA: Diagnosis present

## 2021-04-10 DIAGNOSIS — M25561 Pain in right knee: Secondary | ICD-10-CM | POA: Diagnosis present

## 2021-04-10 DIAGNOSIS — I76 Septic arterial embolism: Secondary | ICD-10-CM | POA: Diagnosis present

## 2021-04-10 DIAGNOSIS — Z7982 Long term (current) use of aspirin: Secondary | ICD-10-CM

## 2021-04-10 DIAGNOSIS — B9562 Methicillin resistant Staphylococcus aureus infection as the cause of diseases classified elsewhere: Secondary | ICD-10-CM | POA: Diagnosis present

## 2021-04-10 DIAGNOSIS — D62 Acute posthemorrhagic anemia: Secondary | ICD-10-CM | POA: Diagnosis present

## 2021-04-10 DIAGNOSIS — Z833 Family history of diabetes mellitus: Secondary | ICD-10-CM | POA: Diagnosis not present

## 2021-04-10 DIAGNOSIS — E119 Type 2 diabetes mellitus without complications: Secondary | ICD-10-CM | POA: Diagnosis not present

## 2021-04-10 DIAGNOSIS — I10 Essential (primary) hypertension: Secondary | ICD-10-CM | POA: Diagnosis present

## 2021-04-10 DIAGNOSIS — Z8249 Family history of ischemic heart disease and other diseases of the circulatory system: Secondary | ICD-10-CM

## 2021-04-10 DIAGNOSIS — E1165 Type 2 diabetes mellitus with hyperglycemia: Secondary | ICD-10-CM | POA: Diagnosis present

## 2021-04-10 DIAGNOSIS — J449 Chronic obstructive pulmonary disease, unspecified: Secondary | ICD-10-CM | POA: Diagnosis present

## 2021-04-10 DIAGNOSIS — M6088 Other myositis, other site: Secondary | ICD-10-CM | POA: Diagnosis present

## 2021-04-10 DIAGNOSIS — K5901 Slow transit constipation: Secondary | ICD-10-CM | POA: Diagnosis not present

## 2021-04-10 DIAGNOSIS — M609 Myositis, unspecified: Secondary | ICD-10-CM | POA: Diagnosis present

## 2021-04-10 DIAGNOSIS — Z794 Long term (current) use of insulin: Secondary | ICD-10-CM

## 2021-04-10 DIAGNOSIS — M6009 Infective myositis, multiple sites: Secondary | ICD-10-CM | POA: Diagnosis not present

## 2021-04-10 DIAGNOSIS — Z79899 Other long term (current) drug therapy: Secondary | ICD-10-CM

## 2021-04-10 DIAGNOSIS — R0981 Nasal congestion: Secondary | ICD-10-CM | POA: Diagnosis present

## 2021-04-10 DIAGNOSIS — D72829 Elevated white blood cell count, unspecified: Secondary | ICD-10-CM

## 2021-04-10 LAB — CBC
HCT: 34.7 % — ABNORMAL LOW (ref 36.0–46.0)
Hemoglobin: 11.3 g/dL — ABNORMAL LOW (ref 12.0–15.0)
MCH: 28.5 pg (ref 26.0–34.0)
MCHC: 32.6 g/dL (ref 30.0–36.0)
MCV: 87.4 fL (ref 80.0–100.0)
Platelets: 543 10*3/uL — ABNORMAL HIGH (ref 150–400)
RBC: 3.97 MIL/uL (ref 3.87–5.11)
RDW: 13.9 % (ref 11.5–15.5)
WBC: 17.9 10*3/uL — ABNORMAL HIGH (ref 4.0–10.5)
nRBC: 0 % (ref 0.0–0.2)

## 2021-04-10 LAB — BASIC METABOLIC PANEL
Anion gap: 9 (ref 5–15)
BUN: 6 mg/dL — ABNORMAL LOW (ref 8–23)
CO2: 28 mmol/L (ref 22–32)
Calcium: 8.4 mg/dL — ABNORMAL LOW (ref 8.9–10.3)
Chloride: 98 mmol/L (ref 98–111)
Creatinine, Ser: 0.72 mg/dL (ref 0.44–1.00)
GFR, Estimated: >60 mL/min (ref >60)
Glucose, Bld: 147 mg/dL — ABNORMAL HIGH (ref 70–99)
Potassium: 3.6 mmol/L (ref 3.5–5.1)
Sodium: 135 mmol/L (ref 135–145)

## 2021-04-10 LAB — GLUCOSE, CAPILLARY
Glucose-Capillary: 155 mg/dL — ABNORMAL HIGH (ref 70–99)
Glucose-Capillary: 184 mg/dL — ABNORMAL HIGH (ref 70–99)
Glucose-Capillary: 256 mg/dL — ABNORMAL HIGH (ref 70–99)
Glucose-Capillary: 136 mg/dL — ABNORMAL HIGH (ref 70–99)

## 2021-04-10 LAB — ANAEROBIC CULTURE W GRAM STAIN

## 2021-04-10 LAB — AEROBIC/ANAEROBIC CULTURE W GRAM STAIN (SURGICAL/DEEP WOUND)

## 2021-04-10 MED ORDER — ENOXAPARIN SODIUM 40 MG/0.4ML IJ SOSY
40.0000 mg | PREFILLED_SYRINGE | INTRAMUSCULAR | Status: DC
  Administered 2021-04-11 – 2021-04-18 (×8): 40 mg via SUBCUTANEOUS
  Filled 2021-04-10 (×8): qty 0.4

## 2021-04-10 MED ORDER — METOPROLOL TARTRATE 50 MG PO TABS: 50.0000 mg | ORAL_TABLET | Freq: Two times a day (BID) | ORAL | Status: DC

## 2021-04-10 MED ORDER — PROCHLORPERAZINE 25 MG RE SUPP
12.5000 mg | Freq: Four times a day (QID) | RECTAL | Status: DC | PRN
Start: 2021-04-10 — End: 2021-04-18

## 2021-04-10 MED ORDER — ACETAMINOPHEN 325 MG PO TABS
325.0000 mg | ORAL_TABLET | ORAL | Status: DC | PRN
  Administered 2021-04-12 – 2021-04-13 (×2): 650 mg via ORAL
  Filled 2021-04-10: qty 2

## 2021-04-10 MED ORDER — AMLODIPINE BESYLATE 10 MG PO TABS: 10.0000 mg | ORAL_TABLET | Freq: Every day | ORAL | Status: DC

## 2021-04-10 MED ORDER — ENOXAPARIN SODIUM 40 MG/0.4ML IJ SOSY: 40.0000 mg | PREFILLED_SYRINGE | INTRAMUSCULAR | Status: DC

## 2021-04-10 MED ORDER — GUAIFENESIN-DM 100-10 MG/5ML PO SYRP
5.0000 mL | ORAL_SOLUTION | Freq: Four times a day (QID) | ORAL | Status: DC | PRN
Start: 2021-04-10 — End: 2021-04-10

## 2021-04-10 MED ORDER — IPRATROPIUM BROMIDE 0.06 % NA SOLN
1.0000 | Freq: Two times a day (BID) | NASAL | Status: DC
  Administered 2021-04-10 – 2021-04-18 (×16): 1 via NASAL
  Filled 2021-04-10: qty 15

## 2021-04-10 MED ORDER — BISACODYL 10 MG RE SUPP: 10.0000 mg | Freq: Every day | RECTAL | Status: DC | PRN

## 2021-04-10 MED ORDER — TRAZODONE HCL 50 MG PO TABS: 25.0000 mg | ORAL_TABLET | Freq: Every evening | ORAL | Status: DC | PRN

## 2021-04-10 MED ORDER — FLEET ENEMA 7-19 GM/118ML RE ENEM
1.0000 | ENEMA | Freq: Once | RECTAL | Status: DC | PRN
Start: 2021-04-10 — End: 2021-04-18

## 2021-04-10 MED ORDER — IPRATROPIUM-ALBUTEROL 0.5-2.5 (3) MG/3ML IN SOLN: 3.0000 mL | RESPIRATORY_TRACT | Status: DC | PRN

## 2021-04-10 MED ORDER — LIVING WELL WITH DIABETES BOOK
Freq: Once | Status: AC
  Filled 2021-04-10: qty 1

## 2021-04-10 MED ORDER — DOCUSATE SODIUM 100 MG PO CAPS
100.0000 mg | ORAL_CAPSULE | Freq: Two times a day (BID) | ORAL | Status: DC
  Administered 2021-04-10 – 2021-04-18 (×16): 100 mg via ORAL
  Filled 2021-04-10 (×16): qty 1

## 2021-04-10 MED ORDER — AMLODIPINE BESYLATE 10 MG PO TABS
10.0000 mg | ORAL_TABLET | Freq: Every day | ORAL | Status: DC
  Administered 2021-04-11 – 2021-04-18 (×8): 10 mg via ORAL
  Filled 2021-04-10 (×8): qty 1

## 2021-04-10 MED ORDER — SODIUM CHLORIDE 0.9% FLUSH
10.0000 mL | Freq: Two times a day (BID) | INTRAVENOUS | Status: DC
  Administered 2021-04-10 – 2021-04-18 (×15): 10 mL

## 2021-04-10 MED ORDER — CHLORHEXIDINE GLUCONATE CLOTH 2 % EX PADS
6.0000 | MEDICATED_PAD | Freq: Every day | CUTANEOUS | Status: DC
  Administered 2021-04-11: 6 via TOPICAL

## 2021-04-10 MED ORDER — LOSARTAN POTASSIUM 50 MG PO TABS
50.0000 mg | ORAL_TABLET | Freq: Every day | ORAL | Status: DC
  Administered 2021-04-10 – 2021-04-17 (×8): 50 mg via ORAL
  Filled 2021-04-10 (×8): qty 1

## 2021-04-10 MED ORDER — PHENOL 1.4 % MT LIQD
1.0000 | OROMUCOSAL | Status: DC | PRN
  Filled 2021-04-10: qty 177

## 2021-04-10 MED ORDER — DIPHENHYDRAMINE HCL 12.5 MG/5ML PO ELIX: 12.5000 mg | ORAL_SOLUTION | Freq: Four times a day (QID) | ORAL | Status: DC | PRN

## 2021-04-10 MED ORDER — METHOCARBAMOL 500 MG PO TABS
500.0000 mg | ORAL_TABLET | Freq: Four times a day (QID) | ORAL | Status: DC | PRN
  Administered 2021-04-11 – 2021-04-12 (×2): 500 mg via ORAL
  Filled 2021-04-10 (×2): qty 1

## 2021-04-10 MED ORDER — PROCHLORPERAZINE MALEATE 5 MG PO TABS: 5.0000 mg | ORAL_TABLET | Freq: Four times a day (QID) | ORAL | Status: DC | PRN

## 2021-04-10 MED ORDER — SENNA 8.6 MG PO TABS
2.0000 | ORAL_TABLET | Freq: Two times a day (BID) | ORAL | Status: DC
  Administered 2021-04-10 – 2021-04-17 (×15): 17.2 mg via ORAL
  Filled 2021-04-10 (×16): qty 2

## 2021-04-10 MED ORDER — IBUPROFEN 600 MG PO TABS: 600.0000 mg | ORAL_TABLET | Freq: Four times a day (QID) | ORAL | Status: DC | PRN

## 2021-04-10 MED ORDER — ALUM & MAG HYDROXIDE-SIMETH 200-200-20 MG/5ML PO SUSP: 30.0000 mL | ORAL | Status: DC | PRN

## 2021-04-10 MED ORDER — OXYCODONE HCL 5 MG PO TABS
5.0000 mg | ORAL_TABLET | ORAL | Status: DC | PRN
  Administered 2021-04-11 – 2021-04-18 (×18): 5 mg via ORAL
  Filled 2021-04-10 (×18): qty 1

## 2021-04-10 MED ORDER — INSULIN GLARGINE-YFGN 100 UNIT/ML ~~LOC~~ SOLN
20.0000 [IU] | Freq: Every day | SUBCUTANEOUS | Status: DC
  Administered 2021-04-11 – 2021-04-13 (×3): 20 [IU] via SUBCUTANEOUS
  Filled 2021-04-10 (×3): qty 0.2

## 2021-04-10 MED ORDER — POLYETHYLENE GLYCOL 3350 17 G PO PACK: 17.0000 g | PACK | Freq: Every day | ORAL | Status: DC | PRN

## 2021-04-10 MED ORDER — PROCHLORPERAZINE EDISYLATE 10 MG/2ML IJ SOLN: 5.0000 mg | Freq: Four times a day (QID) | INTRAMUSCULAR | Status: DC | PRN

## 2021-04-10 MED ORDER — DM-GUAIFENESIN ER 30-600 MG PO TB12: 1.0000 | ORAL_TABLET | Freq: Two times a day (BID) | ORAL | Status: DC | PRN

## 2021-04-10 MED ORDER — OXYCODONE HCL ER 10 MG PO T12A
10.0000 mg | EXTENDED_RELEASE_TABLET | Freq: Two times a day (BID) | ORAL | Status: DC
  Administered 2021-04-10 – 2021-04-12 (×4): 10 mg via ORAL
  Filled 2021-04-10 (×4): qty 1

## 2021-04-10 MED ORDER — INSULIN ASPART 100 UNIT/ML IJ SOLN
9.0000 [IU] | Freq: Three times a day (TID) | INTRAMUSCULAR | Status: DC
  Administered 2021-04-11 – 2021-04-18 (×21): 9 [IU] via SUBCUTANEOUS

## 2021-04-10 MED ORDER — METOPROLOL TARTRATE 50 MG PO TABS
50.0000 mg | ORAL_TABLET | Freq: Two times a day (BID) | ORAL | Status: DC
  Administered 2021-04-10 – 2021-04-18 (×16): 50 mg via ORAL
  Filled 2021-04-10 (×16): qty 1

## 2021-04-10 MED ORDER — OMEGA-3-ACID ETHYL ESTERS 1 G PO CAPS
1.0000 g | ORAL_CAPSULE | Freq: Every day | ORAL | Status: DC
  Administered 2021-04-11 – 2021-04-18 (×8): 1 g via ORAL
  Filled 2021-04-10 (×8): qty 1

## 2021-04-10 MED ORDER — MELATONIN 5 MG PO TABS
5.0000 mg | ORAL_TABLET | Freq: Every day | ORAL | Status: DC
  Administered 2021-04-10 – 2021-04-17 (×8): 5 mg via ORAL
  Filled 2021-04-10 (×8): qty 1

## 2021-04-10 MED ORDER — MENTHOL 3 MG MT LOZG: 1.0000 | LOZENGE | OROMUCOSAL | Status: DC | PRN

## 2021-04-10 MED ORDER — INSULIN ASPART 100 UNIT/ML IJ SOLN
0.0000 [IU] | Freq: Three times a day (TID) | INTRAMUSCULAR | Status: DC
  Administered 2021-04-11: 3 [IU] via SUBCUTANEOUS
  Administered 2021-04-11: 5 [IU] via SUBCUTANEOUS
  Administered 2021-04-12 (×2): 2 [IU] via SUBCUTANEOUS
  Administered 2021-04-12: 3 [IU] via SUBCUTANEOUS
  Administered 2021-04-13: 5 [IU] via SUBCUTANEOUS
  Administered 2021-04-13: 2 [IU] via SUBCUTANEOUS
  Administered 2021-04-13: 3 [IU] via SUBCUTANEOUS
  Administered 2021-04-14 – 2021-04-15 (×4): 2 [IU] via SUBCUTANEOUS
  Administered 2021-04-16: 3 [IU] via SUBCUTANEOUS
  Administered 2021-04-16: 2 [IU] via SUBCUTANEOUS
  Administered 2021-04-16: 5 [IU] via SUBCUTANEOUS
  Administered 2021-04-17 (×2): 2 [IU] via SUBCUTANEOUS
  Administered 2021-04-18: 3 [IU] via SUBCUTANEOUS

## 2021-04-10 MED ORDER — INSULIN ASPART 100 UNIT/ML IJ SOLN
0.0000 [IU] | Freq: Every day | INTRAMUSCULAR | Status: DC
Start: 2021-04-10 — End: 2021-04-18

## 2021-04-10 MED ORDER — VANCOMYCIN HCL 750 MG/150ML IV SOLN
750.0000 mg | Freq: Three times a day (TID) | INTRAVENOUS | Status: DC
  Administered 2021-04-10 – 2021-04-11 (×2): 750 mg via INTRAVENOUS
  Filled 2021-04-10 (×3): qty 150

## 2021-04-10 MED ORDER — EXERCISE FOR HEART AND HEALTH BOOK
Freq: Once | Status: AC
  Filled 2021-04-10: qty 1

## 2021-04-10 MED ORDER — SODIUM CHLORIDE 0.9% FLUSH
10.0000 mL | INTRAVENOUS | Status: DC | PRN
  Administered 2021-04-14: 10 mL

## 2021-04-10 MED ORDER — POLYETHYLENE GLYCOL 3350 17 G PO PACK
17.0000 g | PACK | Freq: Every day | ORAL | Status: DC
  Administered 2021-04-11 – 2021-04-12 (×2): 17 g via ORAL
  Filled 2021-04-10 (×2): qty 1

## 2021-04-10 MED ORDER — INSULIN ASPART 100 UNIT/ML IJ SOLN
9.0000 [IU] | Freq: Three times a day (TID) | INTRAMUSCULAR | Status: DC
  Administered 2021-04-10 (×2): 9 [IU] via SUBCUTANEOUS

## 2021-04-10 NOTE — H&P (Signed)
Physical Medicine and Rehabilitation Admission H&P    Chief Complaint  Patient presents with   Debility     HPI: Katie Serrano is a 65 year old female with history of HTN, COPD/Asthma, T2DM, CAD, OA who was admitted on 03/30/21 with weakness, fevers and left flank pain due to  sepsis from MRSA UTI with bacteremia. CT abdomen pelvis showed bilateral pyelonephritis and was negative for hydronephrosis. ID consulted for input and expressed concerns of epidural abscess with impingement causing BLE weakness.  MRI spine done showing 2 cystic collections lesions about left L4-L5 facet favored to be acute facet arthritis possible septic arthritis as well as secondary mass effect on L5 nerve root, abnormal edema with enhancement of left greater than right paraspinous muscles suggestive of acute myositis that could be infectious or inflammatory in nature versus injury or strain.  Dr. Jake Samples was consulted for input and felt that no significant neural compression seen or drainable abscess.  Medical management and treatment with antibiotics recommended and neurosurgery signed off.  TEE showed EF of 55 to 60% with mild grade 2.  Plaque involving descending aorta, no aortic renal thrombus and was negative for endocarditis.  Patient also complained of throbbing headaches of her right eye on 07/29 and neurology was consulted for input. MRI brain done showing few scattered subcentimeter acute ischemic nonhemorrhagic infarcts involving left caudate head, left parietal lobe, left splenium and right temporal lobe with question of diffuse leptomeningeal enhancement involving both cerebral hemispheres with question of meningitis.  CTA head/neck done showing no large vessel occlusion or significant stenosis.  Dr. Otelia Limes was consulted for input on embolic stroke and did did not recommend LP as patient had already been on antibiotics, without nuchal rigidity and would not change course of treatment.   He questioned A. fib as  cause of him embolic  phenomena and recommended treatment with lengthy antibiotic course due to concerns of septic emboli to brain.     She developed right knee swelling with pain due to septic arthritis due to MRSA positive cultures. She was taken to OR for I & D of knee by Dr. Linna Caprice on 07/30. Dr. Drue Second recommends 8 week course  Vanc for possible L4/5 discitis with early osteo, CNS septic embolic and septic arthritis.  Electrolyte abnormalities resolving with downward trend in WBC. She continues to be limited by knee pain with weakness, generalized malaise and noted to have decreased activity tolerance. Therapy ongoing and working on standing attempts. CIR recommended due to functional decline. She currently complains of right lower extremity weakness.   Review of Systems  Constitutional:  Negative for chills and fever.       Gets hot and flushed for past few days--new  HENT:  Negative for hearing loss and tinnitus.   Eyes:  Negative for blurred vision and double vision.  Respiratory:  Negative for cough and shortness of breath.   Cardiovascular:  Negative for chest pain, palpitations and leg swelling.  Gastrointestinal:  Positive for constipation. Negative for heartburn and nausea.  Genitourinary:  Negative for dysuria and urgency.  Musculoskeletal:  Positive for back pain, joint pain and myalgias.  Neurological:  Positive for sensory change (at times in feet), weakness and headaches (over right eye on and off).  Psychiatric/Behavioral:  Negative for depression. The patient has insomnia. The patient is not nervous/anxious.     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 Relevant Hx: Course: Daily Update: Today's Plan:discussed with her that her sugars will increase and she has actively worked on her diet and diet and lost from 242 to 188 which I commend her on, she is to continue with her weight loss efforts despite taking this   Electronically signed by: Sandi Mealy    Past Surgical History:  Procedure Laterality Date   APPENDECTOMY     BUBBLE STUDY  04/04/2021   Procedure: BUBBLE STUDY;  Surgeon: Sande Rives, MD;  Location: [facility] ENDOSCOPY;  Service: Cardiovascular;;   BUNIONECTOMY Right    I & D EXTREMITY Right 04/05/2021   Procedure: IRRIGATION AND DEBRIDEMENT KNEE;  Surgeon: Samson Frederic, MD;  Location: [facility] OR;  Service: Orthopedics;  Laterality: Right;   LEFT HEART CATH AND CORONARY ANGIOGRAPHY N/A 03/12/2021   Procedure: LEFT HEART CATH AND CORONARY ANGIOGRAPHY;  Surgeon: Lennette Bihari, MD;  Location: [facility] INVASIVE CV LAB;  Service: Cardiovascular;  Laterality: N/A;   NECK SURGERY     SMALL INTESTINE SURGERY     TEE WITHOUT CARDIOVERSION N/A 04/04/2021   Procedure: TRANSESOPHAGEAL ECHOCARDITRRROGRAM (TEE);  Surgeon: Sande Rives, MD;  Location: [facility] ENDOSCOPY;  Service: Cardiovascular;  Laterality: N/A;   TONSILLECTOMY      Family History  Problem Relation Age of Onset   Diabetes Mother    Heart attack Mother 51   Angina Father    Heart attack Father 47   Diabetes Paternal Grandmother     Social History:  Married. Retired [employer]. She  reports that she has been  smoking cigarettes. She has been smoking an average of .5 packs per day. She has never used smokeless tobacco. She reports current alcohol use of about 1.0 standard drink of alcohol on occasion. She reports that she does not use drugs.   Allergies: No Known Allergies   Facility-Administered Medications Prior to Admission  Medication Dose Route Frequency Provider Last Rate Last Admin   sodium chloride flush (NS) 0.9 % injection 3 mL  3 mL Intravenous Q12H Baldo Daub, MD       Medications Prior to Admission  Medication Sig Dispense Refill   albuterol (VENTOLIN HFA) 108 (90 Base) MCG/ACT inhaler Inhale 2 puffs into the lungs every 6 (six) hours as needed for wheezing or shortness of breath.     amLODipine (NORVASC) 10 MG tablet Take 1 tablet (10 mg total) by mouth daily.     aspirin 81 MG chewable tablet Chew 1 tablet (81 mg total) by mouth 2 (two) times daily with a meal. 90 tablet 0   Cinnamon 500 MG capsule Take 500 mg by mouth daily.     Green Tea 150 MG CAPS Take 150 mg by mouth daily.     ibuprofen (ADVIL) 600 MG tablet Take 1 tablet (600 mg total) by mouth every 6 (six) hours as  needed (pain).     losartan (COZAAR) 50 MG tablet Take 50 mg by mouth at bedtime.     metFORMIN (GLUCOPHAGE-XR) 500 MG 24 hr tablet Take 500 mg by mouth 2 (two) times daily.     metoprolol tartrate (LOPRESSOR) 50 MG tablet Take 1 tablet (50 mg total) by mouth 2 (two) times daily.     Multiple Vitamin (MULTIVITAMIN) capsule Take 1 capsule by mouth daily.     nitroGLYCERIN (NITROSTAT) 0.4 MG SL tablet Place 1 tablet (0.4 mg total) under the tongue every 5 (five) minutes as needed. 25 tablet 3   Omega-3 1000 MG CAPS Take 1,000 mg by mouth daily.     oxyCODONE (OXY IR/ROXICODONE) 5 MG immediate release tablet Take 1 tablet (5 mg total) by mouth every 4 (four) hours as needed for moderate pain or severe pain. 40 tablet 0    Drug Regimen Review  Drug regimen was reviewed and remains appropriate with no  significant issues identified   Home: Home Living Family/patient expects to be discharged to:: Private residence Living Arrangements: Spouse/significant other Available Help at Discharge: Family, Available 24 hours/day Type of Home: House Home Access: Stairs to enter Secretary/administrator of Steps: 3 Entrance Stairs-Rails: Can reach both Home Layout: One level Bathroom Shower/Tub: Psychologist, counselling, Engineer, manufacturing systems: Standard Home Equipment: Information systems manager - built in Additional Comments: Pt husband present and supportive throughout session   Functional History: Prior Function Level of Independence: Independent Comments: Pt watches grandchildren and great grand children daily, also likes to garden   Functional Status:  Mobility: Bed Mobility Overal bed mobility: Needs Assistance Bed Mobility: Supine to Sit, Sit to Supine Rolling: Min assist Sidelying to sit: Mod assist Supine to sit: HOB elevated Sit to supine: Min assist Sit to sidelying: Min assist General bed mobility comments: min A for management of RLE, and one tug of the bed pad to adjust hips Transfers Overall transfer level: Needs assistance Equipment used: None Transfers: Squat Pivot Transfers Sit to Stand: Min assist, From elevated surface Squat pivot transfers: Mod assist, +2 physical assistance General transfer comment: pt declined this session Ambulation/Gait Ambulation/Gait assistance: Min assist Gait Distance (Feet): 10 Feet (seated/toileted; 10) Assistive device: Rolling walker (2 wheeled) Gait Pattern/deviations: Step-to pattern, Decreased step length - right, Decreased step length - left, Shuffle General Gait Details: Unable due to pain Gait velocity: slowed Gait velocity interpretation: <1.31 ft/sec, indicative of household ambulator   ADL: ADL Overall ADL's : Needs assistance/impaired Eating/Feeding: Independent, Bed level Grooming: Wash/dry face, Oral care, Brushing hair Grooming  Details (indicate cue type and reason): pt completed grooming tasks while sitting EOB wtih R foot supported on a chair Upper Body Bathing: Set up, Bed level Lower Body Bathing: Maximal assistance, Bed level Upper Body Dressing : Sitting, Set up Upper Body Dressing Details (indicate cue type and reason): long sitting in bed Lower Body Dressing: Maximal assistance, Bed level Toilet Transfer: Total assistance (bed pan) Toileting- Clothing Manipulation and Hygiene: Maximal assistance, Bed level Toileting - Clothing Manipulation Details (indicate cue type and reason): Min A for balance during clothing management and wiping Functional mobility during ADLs: Minimal assistance (limited to bed) General ADL Comments: Pt able to get to EOB this session wtih incrased time, rest breaks for pain managment and her husband assisting her RLE   Cognition: Cognition Overall Cognitive Status: Within Functional Limits for tasks assessed Orientation Level: Oriented X4 Cognition Arousal/Alertness: Awake/alert Behavior During Therapy: WFL for tasks assessed/performed Overall Cognitive Status: Within Functional  Limits for tasks assessed Area of Impairment: Safety/judgement Following Commands: Follows one step commands consistently Safety/Judgement: Decreased awareness of safety, Decreased awareness of deficits Problem Solving: Requires verbal cues, Requires tactile cues General Comments: anxious but motivated today  Physical Exam: Blood pressure 138/71, pulse 79, temperature 98.2 F (36.8 C), temperature source Oral, resp. rate 18, weight 82.9 kg, SpO2 95 %. Physical Exam: Gen: no distress, normal appearing HEENT: oral mucosa pink and moist, NCAT Cardio: Reg rate Chest: normal effort, normal rate of breathing Abd: soft, non-distended Ext: no edema Psych: pleasant, normal affect Skin: see MSK Musculoskeletal:     Comments: Right knee incision C/D with staples in place. Drain site scabbed over. Right  side 0/5 HF and KE, 5/5 DF/PF Neurological:     Mental Status: She is alert and oriented to person, place, and time.  Psychiatric:        Mood and Affect: Mood normal.   Results for orders placed or performed during the hospital encounter of 03/30/21 (from the past 48 hour(s))  Glucose, capillary     Status: Abnormal   Collection Time: 04/08/21  9:05 PM  Result Value Ref Range   Glucose-Capillary 117 (H) 70 - 99 mg/dL    Comment: Glucose reference range applies only to samples taken after fasting for at least 8 hours.  Glucose, capillary     Status: Abnormal   Collection Time: 04/09/21  6:05 AM  Result Value Ref Range   Glucose-Capillary 204 (H) 70 - 99 mg/dL    Comment: Glucose reference range applies only to samples taken after fasting for at least 8 hours.  Glucose, capillary     Status: Abnormal   Collection Time: 04/09/21 11:06 AM  Result Value Ref Range   Glucose-Capillary 352 (H) 70 - 99 mg/dL    Comment: Glucose reference range applies only to samples taken after fasting for at least 8 hours.   Comment 1 Notify RN   Glucose, capillary     Status: Abnormal   Collection Time: 04/09/21  4:29 PM  Result Value Ref Range   Glucose-Capillary 243 (H) 70 - 99 mg/dL    Comment: Glucose reference range applies only to samples taken after fasting for at least 8 hours.  Sedimentation rate     Status: Abnormal   Collection Time: 04/09/21  5:00 PM  Result Value Ref Range   Sed Rate 78 (H) 0 - 22 mm/hr    Comment: Performed at [facility], [address], [place], [place] [address]  C-reactive protein     Status: Abnormal   Collection Time: 04/09/21  5:00 PM  Result Value Ref Range   CRP 11.6 (H) <1.0 mg/dL    Comment: Performed at [facility], [address] [place], [place] [address]  Glucose, capillary     Status: Abnormal   Collection Time: 04/09/21  9:14 PM  Result Value Ref Range   Glucose-Capillary 148 (H) 70 - 99 mg/dL    Comment: Glucose reference range  applies only to samples taken after fasting for at least 8 hours.  CBC     Status: Abnormal   Collection Time: 04/10/21  4:00 AM  Result Value Ref Range   WBC 17.9 (H) 4.0 - 10.5 K/uL   RBC 3.97 3.87 - 5.11 MIL/uL   Hemoglobin 11.3 (L) 12.0 - 15.0 g/dL   HCT 02.5 (L) 42.7 - 06.2 %   MCV 87.4 80.0 - 100.0 fL   MCH 28.5 26.0 - 34.0  pg   MCHC 32.6 30.0 - 36.0 g/dL   RDW 16.113.9 09.611.5 - 04.515.5 %   Platelets 543 (H) 150 - 400 K/uL   nRBC 0.0 0.0 - 0.2 %    Comment: Performed at [facility], [address] [place], [place] [address]  Basic metabolic panel     Status: Abnormal   Collection Time: 04/10/21  4:00 AM  Result Value Ref Range   Sodium 135 135 - 145 mmol/L   Potassium 3.6 3.5 - 5.1 mmol/L   Chloride 98 98 - 111 mmol/L   CO2 28 22 - 32 mmol/L   Glucose, Bld 147 (H) 70 - 99 mg/dL    Comment: Glucose reference range applies only to samples taken after fasting for at least 8 hours.   BUN 6 (L) 8 - 23 mg/dL   Creatinine, Ser 1.910.72 0.44 - 1.00 mg/dL   Calcium 8.4 (L) 8.9 - 10.3 mg/dL   GFR, Estimated >47>60 >82>60 mL/min    Comment: (NOTE) Calculated using the CKD-EPI Creatinine Equation (2021)    Anion gap 9 5 - 15    Comment: Performed at [facility], [address] [place], [place] [address]  Glucose, capillary     Status: Abnormal   Collection Time: 04/10/21  5:55 AM  Result Value Ref Range   Glucose-Capillary 155 (H) 70 - 99 mg/dL    Comment: Glucose reference range applies only to samples taken after fasting for at least 8 hours.  Glucose, capillary     Status: Abnormal   Collection Time: 04/10/21 11:24 AM  Result Value Ref Range   Glucose-Capillary 184 (H) 70 - 99 mg/dL    Comment: Glucose reference range applies only to samples taken after fasting for at least 8 hours.  Glucose, capillary     Status: Abnormal   Collection Time: 04/10/21  4:52 PM  Result Value Ref Range   Glucose-Capillary 256 (H) 70 - 99 mg/dL    Comment: Glucose reference range applies only to  samples taken after fasting for at least 8 hours.   No results found.     Medical Problem List and Plan: 1.  Paraspinal myositis, MRSA bacteremia  -patient may shower but incisions must be covered  -ELOS/Goals: 2-3 weeks MinA  -Admit to CIR 2.  Antithrombotics: -DVT/anticoagulation:  Pharmaceutical: Lovenox  -antiplatelet therapy: N/a 3. Pain Management: Throbbing/jabbing at knee/thigh --Oxycodone prn not effective  --will add Oxycontin for more consistent relief. Robaxin prn for muscle aches.  4. Mood: LCSW to follow for evaluation and support.   -antipsychotic agents: N/A 5. Neuropsych: This patient is capable of making decisions on her own behalf. 6. Skin/Wound Care: Routine pressure relief measures.  7. Fluids/Electrolytes/Nutrition: Monitor I/O. Check CMET in am.  8. Disseminated MRSA infection: Vancomycin w/July 30th as day #1 for 6-8 weeks followed by extended oral course.  --weekly Sed rate, CRP, CMET, CBC  --WBC trending down 24.5-->17.9 9. T2DM: Hgb A1C- 9.8 and poorly controlled.   --Monitor BS ac/hs and use SSI for tighter control.   --Continue Insulin glargine 20 units with 9 units meal coverage.   10. HTN: Monitor BP TID- continues to be labile --continue hydralazine, Norvasc, Losartan and metoprolol  11. Severe constipation: Resolved  --continue Senna S - 2 tabs bid w/miralax daily 12. Septic Knee s/p debridement: WBAT with KI when up.  I have personally performed a face to face diagnostic evaluation, including, but not limited to relevant history and physical exam findings, of  this patient and developed relevant assessment and plan.  Additionally, I have reviewed and concur with the physician assistant's documentation above.  Jacquelynn Cree, PA-C   Sula Soda, MD

## 2021-04-10 NOTE — Progress Notes (Signed)
[facility]    Date of Admission:  03/30/2021      ID: Katie Serrano is a 65 y.o. female with  Principal Problem:   MRSA bacteremia Active Problems:   Class 1 obesity due to excess calories with serious comorbidity and body mass index (BMI) of 30.0 to 30.9 in adult   Essential hypertension   Mixed hyperlipidemia   Coronary artery disease of native artery of native heart with stable angina pectoris (HCC)   SIRS (systemic inflammatory response syndrome) (HCC)   Type 2 diabetes mellitus with hyperglycemia (HCC)   Hyponatremia   Acute pyelonephritis   Epidural abscess   Weakness of both lower extremities   Septic embolism (HCC)   Acute bacterial endocarditis   Cerebral embolism with cerebral infarction   Acute pain of right knee   Rash   Vertebral osteophyte    Subjective: Afebrile. Feeling slightly better,manageable pain to right knee. Going to rehab this afternoon.  Medications:   amLODipine  10 mg Oral Daily   Chlorhexidine Gluconate Cloth  6 each Topical Daily   docusate sodium  100 mg Oral BID   enoxaparin (LOVENOX) injection  40 mg Subcutaneous Q24H   insulin aspart  0-15 Units Subcutaneous TID WC   insulin aspart  0-5 Units Subcutaneous QHS   insulin aspart  9 Units Subcutaneous TID WC   insulin glargine-yfgn  20 Units Subcutaneous Daily   ipratropium  1 spray Each Nare BID   losartan  50 mg Oral QHS   metoprolol tartrate  50 mg Oral BID   omega-3 acid ethyl esters  1 g Oral Daily   polyethylene glycol  17 g Oral Daily   senna  2 tablet Oral BID    Objective: Vital signs in last 24 hours: Temp:  [97.6 F (36.4 C)-98.9 F (37.2 C)] 98.3 F (36.8 C) (08/04 1204) Pulse Rate:  [64-79] 65 (08/04 1204) Resp:  [16-19] 18 (08/04 1204) BP: (126-157)/(63-79) 135/68 (08/04 1204) SpO2:  [91 %-97 %] 95 % (08/04 1204) Physical Exam  Constitutional:  oriented to person, place, and time. appears well-developed and well-nourished. No  distress.  HENT: [place]/AT, PERRLA, no scleral icterus Mouth/Throat: Oropharynx is clear and moist. No oropharyngeal exudate.  Cardiovascular: Normal rate, regular rhythm and normal heart sounds. Exam reveals no gallop and no friction rub.  No murmur heard.  Pulmonary/Chest: Effort normal and breath sounds normal. No respiratory distress.  has no wheezes.  Neck = supple, no nuchal rigidity Abdominal: Soft. Bowel sounds are normal.  exhibits no distension. There is no tenderness.  Ext: right knee slight warmth. Drained removed. Less pain. Lymphadenopathy: no cervical adenopathy. No axillary adenopathy Neurological: alert and oriented to person, place, and time.  Skin: Skin is warm and dry. No rash noted. No erythema.  Psychiatric: a normal mood and affect.  behavior is normal.    Lab Results Recent Labs    04/08/21 0430 04/10/21 0400  WBC 18.3* 17.9*  HGB 11.9* 11.3*  HCT 35.6* 34.7*  NA 134* 135  K 3.7 3.6  CL 97* 98  CO2 30 28  BUN 5* 6*  CREATININE 0.72 0.72   Liver Panel No results for input(s): PROT, ALBUMIN, AST, ALT, ALKPHOS, BILITOT, BILIDIR, IBILI in the last 72 hours. Sedimentation Rate Recent Labs    04/09/21 1700  ESRSEDRATE 78*   C-Reactive Protein Recent Labs    04/09/21 1700  CRP 11.6*    Microbiology: 7/30 right knee cx Methicillin resistant  staphylococcus aureus      MIC    CIPROFLOXACIN >=8 RESISTANT  Resistant    CLINDAMYCIN <=0.25 SENS... Sensitive    ERYTHROMYCIN >=8 RESISTANT  Resistant    GENTAMICIN <=0.5 SENSI... Sensitive    Inducible Clindamycin NEGATIVE  Sensitive    OXACILLIN >=4 RESISTANT  Resistant    RIFAMPIN <=0.5 SENSI... Sensitive    TETRACYCLINE <=1 SENSITIVE  Sensitive    TRIMETH/SULFA <=10 SENSIT... Sensitive    VANCOMYCIN 1 SENSITIVE  Sensitive    Studies/Results: No results found.   Assessment/Plan: 65yo F with disseminated MRSA infection, bacteremia, CNS septic emboli, early lumbar discitis, and right native knee  septic arthritis s/p washout - plan to do 6 wk since last positive culture through sept 12th.  With vancomycin.  - see opat order. Will need weekly labs cbc, bmp, vanco trough, sed rate and crp - will see back in ID clinic to see if needs further oral suppressive therapy   Right knee pain = likely from septic arthritis, had recent washout. Continue with monitoring progress with PT. To follow up with orthopedics  Leukocytosis = anticipate to continue to trend down since having surgery, and on treatment. If having fever, recommend to repeat blood cx.   Will sign off.  [facility] Cell: [phone] Pager: [phone]  04/10/2021, 4:12 PM

## 2021-04-10 NOTE — Discharge Summary (Signed)
Physician Discharge Summary  Katie Serrano GLO:[id number] DOB: 1955/09/19 DOA: 03/30/2021  PCP: Raina Mina., MD  Admit date: 03/30/2021 Discharge date: 04/10/2021  Admitted From: Home Disposition: [facility]   Recommendations for Outpatient Follow-up:  Continue IV vancomycin per ID recommendations for 6 weeks from date of right knee debridement (7/30) with plans to reevaluate at that time for possible continuation of oral antibiotics.  Follow up with orthopedics, Dr. Lyla Glassing in 10-14 days.  Consider trending CRP, ESR, WBC. Continued glucose monitoring and insulin management.   Home Health: N/A Equipment/Devices: Per [facility] Discharge Condition: Stable CODE STATUS: Full Diet recommendation: Heart healthy, carb-modified  Brief/Interim Summary: Katie Serrano is a 65 y.o. female with a history of HTN, asthma, tobacco use, COPD, T2DM, HLD, OA, and GERD who presented to the ED with fever to 105F and malaise found to have sepsis due to MRSA UTI and bacteremia for which vancomycin has been given. TTE was negative 7/27, subsequent TEE negative 7/29. Due to R > L LE weakness, spinal MR was performed and revealed L4-5 facet septic arthritis, several fluiid collections and mass effect on L5 nerve and myositis near L4-5. Neurosurgery was consulted but did not recommend surgical intervention at this time. Brain MRI was obtained 04/03/21 and showed scattered infarcts involving the left caudate head, left parietal lobe, left splenium, and right temporal lobe. It also showed diffuse leptomeningeal enhancement concerning for meningitis. Neurology was consulted. The patient's right knee has developed painful swelling acutely on 7/30 for which arthrocentesis and subsequent arthrotomy and drainage was performed.     Plan currently is to continue IV vancomycin for 6 weeks. The patient will continue intensive rehabilitation efforts at CIR at discharge prior to returning home.  Discharge Diagnoses:  Principal  Problem:   MRSA bacteremia Active Problems:   Class 1 obesity due to excess calories with serious comorbidity and body mass index (BMI) of 30.0 to 30.9 in adult   Essential hypertension   Mixed hyperlipidemia   Coronary artery disease of native artery of native heart with stable angina pectoris (HCC)   SIRS (systemic inflammatory response syndrome) (HCC)   Type 2 diabetes mellitus with hyperglycemia (HCC)   Hyponatremia   Acute pyelonephritis   Epidural abscess   Weakness of both lower extremities   Septic embolism (HCC)   Acute bacterial endocarditis   Cerebral embolism with cerebral infarction   Acute pain of right knee   Rash   Vertebral osteophyte  Sepsis due to MRSA pyelonephritis, bacteremia with acute facet arthritis, septic arthritis, paraspinal myositis: TTE and subsequent TEE negative for vegetation, negative bubble study. - Repeat blood cultures 7/27-7/28 remained negative (final), PICC inserted 8/2 in RUE brachial vein to continue vancomycin x6 weeks from 7/30 per ID. May continue po abx at that time.    Right knee septic arthritis: Cultures are growing MRSA as expected despite no organisms on gram stain. - Abx as above. - PT/OT, WBAT. Dispo to CIR.   - Orthopedics recommended aspirin 64m po BID for VTE ppx at discharge through 9/15.  - Only requiring oxycodone prn pain.  - Drain removed per orthopedics 8/3. Orthopedics recommends follow up with them in 10-14 days.    Constipation: Severe. Had BM 7/31. - Continue senna 2 tab BID, miralax daily, colace BID. Had significant BM 8/2 with suppository. Recommend regular regimen now.   Pustular lesions: Typical of MRSA boils consistent with patient's bacteremia. Improved.   Uncontrolled T2DM with hyperglycemia: HbA1c 9.8%. Came in on metformin alone. -  Further increased lantus to 20u, continue moderate SSI, increased novolog to 9u [facility] which improved glycemic control. Continue adjustments per CIR MD.  B/L Neuropathic and  cramping lower extremity pain: MR with concern for septic arthritis/acute facet arthritis, mass-effect of L5 nerve root, and marrow edema in the paraspinous musculature concerning for acute myositis. - Neurosurgery consulted, recommending against surgery/instrumentation. Symptoms improving.   Hyponatremia, suspect hypovolemia: Resolved.   Essential hypertension - Increased norvasc to 22m - Continue losartan 50 mg bedtime - Increased metoprolol to 565mBID. Improved overall. If elevated, could increase hydralazine dose/frequency.    Mixed hyperlipidemia: LDL 44. - Statin stopped due to her allergy and concerns. Continue lovaza   CAD: No angina. - Continue BB, lovaza, ASA 817m    Sore throat: Most likely sequela of intubation during surgery. Also possibly PND component. Resolved.   Discharge Instructions  Allergies as of 04/10/2021   No Known Allergies      Medication List     STOP taking these medications    atorvastatin 80 MG tablet Commonly known as: LIPITOR       TAKE these medications    albuterol 108 (90 Base) MCG/ACT inhaler Commonly known as: VENTOLIN HFA Inhale 2 puffs into the lungs every 6 (six) hours as needed for wheezing or shortness of breath.   amLODipine 10 MG tablet Commonly known as: NORVASC Take 1 tablet (10 mg total) by mouth daily. What changed:  medication strength how much to take   aspirin 81 MG chewable tablet Chew 1 tablet (81 mg total) by mouth 2 (two) times daily with a meal. What changed: when to take this   Cinnamon 500 MG capsule Take 500 mg by mouth daily.   Green Tea 150 MG Caps Take 150 mg by mouth daily.   ibuprofen 600 MG tablet Commonly known as: ADVIL Take 1 tablet (600 mg total) by mouth every 6 (six) hours as needed (pain). What changed:  medication strength how much to take reasons to take this   losartan 50 MG tablet Commonly known as: COZAAR Take 50 mg by mouth at bedtime.   metFORMIN 500 MG 24 hr  tablet Commonly known as: GLUCOPHAGE-XR Take 500 mg by mouth 2 (two) times daily.   metoprolol tartrate 50 MG tablet Commonly known as: LOPRESSOR Take 1 tablet (50 mg total) by mouth 2 (two) times daily. What changed:  medication strength how much to take   multivitamin capsule Take 1 capsule by mouth daily.   nitroGLYCERIN 0.4 MG SL tablet Commonly known as: NITROSTAT Place 1 tablet (0.4 mg total) under the tongue every 5 (five) minutes as needed.   Omega-3 1000 MG Caps Take 1,000 mg by mouth daily.   oxyCODONE 5 MG immediate release tablet Commonly known as: Oxy IR/ROXICODONE Take 1 tablet (5 mg total) by mouth every 4 (four) hours as needed for moderate pain or severe pain.        No Known Allergies  Consultations: Neurosurgery ID Neurology Orthopedic surgery PM&R  Procedures/Studies: DG Chest 1 View  Result Date: 03/30/2021 CLINICAL DATA:  General malaise for 4 days.  Vomiting and fever. EXAM: CHEST  1 VIEW COMPARISON:  07/29/2005 FINDINGS: Normal heart size and pulmonary vascularity. No focal airspace disease or consolidation in the lungs. No blunting of costophrenic angles. No pneumothorax. Mediastinal contours appear intact. Postoperative changes in the cervical spine. IMPRESSION: No active disease. Electronically Signed   By: WilLucienne CapersD.   On: 03/30/2021 23:19   MR BRAIN  W WO CONTRAST  Result Date: 04/03/2021 CLINICAL DATA:  Initial evaluation for acute headache, papilledema, history of MRSA bacteremia. EXAM: MRI HEAD AND ORBITS WITHOUT AND WITH CONTRAST TECHNIQUE: Multiplanar, multiecho pulse sequences of the brain and surrounding structures were obtained without and with intravenous contrast. Multiplanar, multiecho pulse sequences of the orbits and surrounding structures were obtained including fat saturation techniques, before and after intravenous contrast administration. CONTRAST:  8.57m GADAVIST GADOBUTROL 1 MMOL/ML IV SOLN COMPARISON:  Prior CT  from 05/02/2011. FINDINGS: MRI HEAD FINDINGS Brain: Cerebral volume within normal limits for age. Scattered patchy T2/FLAIR hyperintensity seen involving the periventricular, deep, and subcortical white matter both cerebral hemispheres as well as the pons, most likely related chronic microvascular ischemic disease, moderate in nature. 7 mm acute ischemic infarcts seen involving the left caudate head (series 2, image 30). Few additional scattered subcentimeter ischemic infarcts noted involving the cortical and subcortical left parietal lobe (series 2, images 34, 33) as well as the left splenium (series 2, image 27). Suspected additional subtle acute to subacute ischemic infarct noted involving the right temporal lobe (series 2, image 19). Suspected subtle associated enhancement about the right temporal infarct (series 17, image 11). No other associated enhancement. No mass effect or associated hemorrhage. Otherwise, gray-white matter differentiation maintained. No encephalomalacia to suggest chronic cortical infarction elsewhere within the brain. No other foci of susceptibility artifact to suggest acute or chronic intracranial hemorrhage. No mass lesion, midline shift or mass effect. No hydrocephalus or extra-axial fluid collection. No intraventricular effusion or debris. Pituitary gland suprasellar region within normal limits. Midline structures intact and normal. On corresponding orbit portion of this exam, there is question of diffuse left a meningeal enhancement involving both cerebral hemispheres (series 14, image 1). Finding not entirely certain, and could be technical in nature. No other abnormal enhancement. Vascular: Major intracranial vascular flow voids are well maintained. Skull and upper cervical spine: Craniocervical junction within normal limits. Bone marrow signal intensity normal. No scalp soft tissue abnormality. Other: Trace left mastoid effusion, of doubtful significance. Inner ear structures  grossly normal. MRI ORBITS FINDINGS Orbits: Globes are symmetric in size with normal appearance and morphology bilaterally. Apparent asymmetric FLAIR signal abnormality involving the right globe felt to be artifactual on this exam, with no signal abnormality seen on corresponding sequences. Optic nerves within normal limits without intrinsic edema or abnormal enhancement. No abnormality about the optic nerve sheaths. Intraconal and extraconal fat well-maintained. Extra-ocular muscles symmetric and within normal limits. Lacrimal glands normal. No abnormality about the orbital apices. Cavernous sinus within normal limits. Superior orbital veins symmetric and unremarkable. Optic chiasm normally situated within the suprasellar cistern without abnormality. No evidence for acute infection about either orbit. Question diffuse leptomeningeal enhancement involving the visualized cerebral hemispheres as above. Visualized sinuses: Scattered mucosal thickening noted about the ethmoidal air cells. Visualized paranasal sinuses are otherwise clear. Soft tissues: Unremarkable. IMPRESSION: 1. 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 No associated hemorrhage or mass effect. 2. Question diffuse leptomeningeal enhancement involving both cerebral hemispheres as above. Finding not entirely certain, and could be technical in nature on this exam. However, possible meningitis should be considered in the setting of MRSA bacteremia and scattered ischemic infarcts. Correlation with LP and CSF studies recommended for further evaluation. 3. No other acute intracranial abnormality. No other evidence for acute CNS or intraorbital infection. 4. Moderate chronic microvascular ischemic disease. Electronically Signed   By:  Jeannine Boga M.D.   On: 04/03/2021 22:10   MR Lumbar Spine W Wo Contrast  Result Date:  04/03/2021 CLINICAL DATA:  Initial evaluation for intermittent left flank pain with radiation into the left lower back. EXAM: MRI LUMBAR SPINE WITHOUT AND WITH CONTRAST TECHNIQUE: Multiplanar and multiecho pulse sequences of the lumbar spine were obtained without and with intravenous contrast. CONTRAST:  8.77m GADAVIST GADOBUTROL 1 MMOL/ML IV SOLN COMPARISON:  Prior CT from 03/31/2024. FINDINGS: Segmentation: Standard. Lowest well-formed disc space labeled the L5-S1 level. Alignment: Physiologic with preservation of the normal lumbar lordosis. No listhesis. Vertebrae: Vertebral body height maintained without acute or chronic fracture. Bone marrow signal intensity within normal limits. No worrisome osseous lesions. Joint effusions noted about the left greater than right L4-5 facets without significant marrow edema. No other abnormal marrow edema or enhancement to suggest osteomyelitis discitis. Conus medullaris and cauda equina: Conus extends to the L2 level. Conus and cauda equina appear normal. Paraspinal and other soft tissues: Diffuse edema and enhancement seen about the left greater than right lower posterior paraspinous musculature, most pronounced about the left L4-5 facet (series 6, image 12) small collection emanating from the left L4-5 facet as below. No other soft tissue collections. Mild scattered perinephric and retroperitoneal stranding noted, of uncertain significance, also seen on prior CT. Left periaortic lymph node measures 1 cm in short access, upper limits of normal. Visualized visceral structures otherwise unremarkable. Disc levels: L1-2: Negative interspace. Minimal facet spurring. No canal or foraminal stenosis. L2-3: Mild disc bulge with disc desiccation. Small biforaminal annular fissures noted. Mild facet hypertrophy with associated trace joint effusions. No significant spinal stenosis. Foramina remain patent. L3-4: Disc desiccation with mild annular disc bulge. Superimposed small left  foraminal to extraforaminal disc protrusion closely approximates the exiting left L3 nerve root (series 8, image 23). Mild facet spurring with associated trace joint effusions. Tiny T2 hyperintense lesions measuring 4-5 mm situated posterior to the L3-4 facets noted, possibly reflecting small synovial cysts (series 8, image 27). These are not in a position to cause neural compromise. No spinal stenosis. Foramina remain patent. L4-5: Mild disc bulge with disc desiccation. Superimposed small left foraminal disc protrusion closely approximates and/or contacts the exiting left L4 nerve root (series 7, image 13). Moderate left worse than right facet hypertrophy with associated small joint effusions. There is a superimposed cystic collection/lesion along the anteromedial aspect of the left L4-5 facet, measuring 1.2 x 0.5 x 1.3 cm (series 6, image 11). Lesion contacts and mildly displaces the descending left L5 nerve root in the left lateral recess with resultant mild left lateral recess stenosis. There is an additional T2 hyperintense lesion situated posterior to the L4-5 facet measuring 1.6 x 0.9 x 1.2 cm (series 8, image 33). These lesions demonstrate peripheral enhancement. Overall, findings are favored to reflect acute facet arthritis with associated synovial cysts. Possible septic arthritis with associated small collections difficult to exclude, and could be considered in the correct clinical setting. No other significant spinal stenosis. Mild left foraminal narrowing. Right neural foramina remains patent. L5-S1: Minimal disc bulge with disc desiccation. Small central annular fissure. Mild bilateral facet hypertrophy. No canal or lateral recess stenosis. Foramina remain patent. IMPRESSION: 1. Joint effusion with surrounding enhancement about the left L4-5 facet, with associated two cystic collections/lesions measuring up to 1.6 cm as above. 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 Possible septic arthritis with associated small collections difficult to exclude, and could  be considered in the correct clinical setting. Secondary mass effect on the descending left L5 nerve root by one of these collections with resultant mild left lateral recess stenosis, which could contribute to left lower extremity symptoms. 2. Abnormal edema and enhancement within the left greater than right lower posterior paraspinous musculature, greatest about the left L4-5 facet. Findings suggestive of acute myositis, which could be either infectious or inflammatory in nature. Possible muscular injury/strain could also be considered in the correct clinical setting. 3. Additional small left foraminal to extraforaminal disc protrusions at L3-4 and L4-5, potentially affecting the exiting left L3 and L4 nerve roots respectively. 4. No other evidence for acute infection within the lumbar spine. Electronically Signed   By: Jeannine Boga M.D.   On: 04/03/2021 02:25   CARDIAC CATHETERIZATION  Result Date: 03/12/2021  Prox RCA lesion is 100% stenosed.  Prox LAD lesion is 45% stenosed.  2nd Diag lesion is 50% stenosed.  Mid LAD lesion is 50% stenosed.  Dist LAD lesion is 50% stenosed.  Prox Cx lesion is 60% stenosed.  2nd Mrg-1 lesion is 50% stenosed.  2nd Mrg-2 lesion is 60% stenosed.  Mid Cx to Dist Cx lesion is 20% stenosed.  Dist RCA lesion is 50% stenosed.  The left ventricular ejection fraction is greater than 65% by visual estimate.  The left ventricular systolic function is normal.  LV end diastolic pressure is mildly elevated.  Dist Cx lesion is 30% stenosed.  Multivessel CAD with smooth 45% proximal LAD stenosis, 50% mid and mid distal LAD stenoses with 50% mid first diagonal stenosis; 50 to 60% eccentric proximal circumflex stenoses with 50 and 60% stenoses in a small caliber OM vessel with 20 and 30% distal circumflex stenoses and total proximal to mid RCA  occlusion with extensive left-to-right collateralization filling the RCA up to the point of total occlusion. Dynamic LV function with EF estimated least 65 to 70%.  There were no focal segmental wall motion abnormalities.  LVEDP 19 mmHg. RECOMMENDATION: Medical therapy trial.  The patient was hypertensive on presentation.  I have elected to add amlodipine 5 mg to her medical regimen which would be helpful both for hypertension and anti-ischemic benefit.  With her high vessel coronary plaque recommend aggressive high potency statin therapy and have suggested discontinuance of low-dose pravastatin and change to atorvastatin 80 mg.  Consider outpatient titration of metoprolol blood pressure and heart rate allow.  The patient will be discharged today with plans for early follow-up with Dr. Shirlee More.   ECHOCARDIOGRAM COMPLETE  Result Date: 04/02/2021    ECHOCARDIOGRAM REPORT   Patient Name:   Katie Serrano Date of Exam: 04/02/2021 Medical Rec #:  [id number]       Height:       66.0 in Accession #:    [id number]      Weight:       185.0 lb Date of Birth:  [birth year]       BSA:          1.934 m Patient Age:    65 years        BP:           161/89 mmHg Patient Gender: F               HR:           88 bpm. Exam Location:  Inpatient Procedure: 2D Echo, Cardiac Doppler and Color Doppler Indications:    Bacteremia  History:  Patient has no prior history of Echocardiogram examinations.                 CAD, COPD, Signs/Symptoms:Fever, UTI, Fatigue and Bacteremia;                 Risk Factors:Hypertension, Dyslipidemia, Diabetes, Current                 Smoker and Obesity.  Sonographer:    Dustin Flock Referring Phys: [id number] [facility]IN  Sonographer Comments: Technically difficult study due to poor echo windows and patient is morbidly obese. IMPRESSIONS  1. Left ventricular ejection fraction, by estimation, is 60 to 65%. The left ventricle has normal function. The left ventricle has no regional wall  motion abnormalities. There is mild left ventricular hypertrophy. Left ventricular diastolic parameters were normal.  2. Right ventricular systolic function is normal. The right ventricular size is normal. There is normal pulmonary artery systolic pressure. The estimated right ventricular systolic pressure is 56.8 mmHg.  3. The mitral valve is normal in structure. Trivial mitral valve regurgitation. No evidence of mitral stenosis.  4. The aortic valve is tricuspid. Aortic valve regurgitation is not visualized. No aortic stenosis is present.  5. The inferior vena cava is normal in size with greater than 50% respiratory variability, suggesting right atrial pressure of 3 mmHg. Conclusion(s)/Recommendation(s): No vegetation seen. If high clinical suspicion for endocarditis, recommend TEE. FINDINGS  Left Ventricle: Left ventricular ejection fraction, by estimation, is 60 to 65%. The left ventricle has normal function. The left ventricle has no regional wall motion abnormalities. The left ventricular internal cavity size was normal in size. There is  mild left ventricular hypertrophy. Left ventricular diastolic parameters were normal. Right Ventricle: The right ventricular size is normal. No increase in right ventricular wall thickness. Right ventricular systolic function is normal. There is normal pulmonary artery systolic pressure. The tricuspid regurgitant velocity is 2.38 m/s, and  with an assumed right atrial pressure of 3 mmHg, the estimated right ventricular systolic pressure is 12.7 mmHg. Left Atrium: Left atrial size was normal in size. Right Atrium: Right atrial size was normal in size. Pericardium: Trivial pericardial effusion is present. Presence of pericardial fat pad. Mitral Valve: The mitral valve is normal in structure. Trivial mitral valve regurgitation. No evidence of mitral valve stenosis. Tricuspid Valve: The tricuspid valve is normal in structure. Tricuspid valve regurgitation is trivial. Aortic  Valve: The aortic valve is tricuspid. Aortic valve regurgitation is not visualized. No aortic stenosis is present. Pulmonic Valve: The pulmonic valve was not well visualized. Pulmonic valve regurgitation is not visualized. Aorta: The aortic root and ascending aorta are structurally normal, with no evidence of dilitation. Venous: The inferior vena cava is normal in size with greater than 50% respiratory variability, suggesting right atrial pressure of 3 mmHg. IAS/Shunts: The interatrial septum was not well visualized.  LEFT VENTRICLE PLAX 2D LVIDd:         4.60 cm  Diastology LVIDs:         2.80 cm  LV e' medial:    8.92 cm/s LV PW:         1.00 cm  LV E/e' medial:  10.4 LV IVS:        1.00 cm  LV e' lateral:   7.29 cm/s LVOT diam:     2.10 cm  LV E/e' lateral: 12.7 LV SV:         70 LV SV Index:   36 LVOT Area:  3.46 cm  RIGHT VENTRICLE RV Basal diam:  2.80 cm RV S prime:     7.83 cm/s TAPSE (M-mode): 1.4 cm LEFT ATRIUM             Index       RIGHT ATRIUM           Index LA diam:        3.40 cm 1.76 cm/m  RA Area:     11.40 cm LA Vol (A2C):   28.6 ml 14.78 ml/m RA Volume:   26.50 ml  13.70 ml/m LA Vol (A4C):   31.9 ml 16.49 ml/m LA Biplane Vol: 30.7 ml 15.87 ml/m  AORTIC VALVE LVOT Vmax:   98.90 cm/s LVOT Vmean:  67.600 cm/s LVOT VTI:    0.202 m  AORTA Ao Root diam: 2.80 cm MITRAL VALVE               TRICUSPID VALVE MV Area (PHT): 3.56 cm    TR Peak grad:   22.7 mmHg MV Decel Time: 213 msec    TR Vmax:        238.00 cm/s MV E velocity: 92.70 cm/s MV A velocity: 77.10 cm/s  SHUNTS MV E/A ratio:  1.20        Systemic VTI:  0.20 m                            Systemic Diam: 2.10 cm Oswaldo Milian MD Electronically signed by Oswaldo Milian MD Signature Date/Time: 04/02/2021/1:14:35 PM    Final    CT RENAL STONE STUDY  Result Date: 03/31/2021 CLINICAL DATA:  Flank pain with kidney stone suspected. Nausea and vomiting with fever and malaise. Symptoms for 4 days. EXAM: CT ABDOMEN AND PELVIS WITHOUT  CONTRAST TECHNIQUE: Multidetector CT imaging of the abdomen and pelvis was performed following the standard protocol without IV contrast. COMPARISON:  None. FINDINGS: Lower chest:  No contributory findings. Hepatobiliary: Hepatic steatosis.No evidence of biliary obstruction or stone. Pancreas: Unremarkable. Spleen: Unremarkable. Adrenals/Urinary Tract: Negative adrenals. No hydronephrosis or stone. Diffuse perinephric stranding and prominent renal size bilaterally. Suspect urothelial thickening bilaterally at the level of the renal pelvis. Unremarkable bladder. Stomach/Bowel:  No obstruction. No appendicitis. Vascular/Lymphatic: No acute vascular abnormality. No mass or adenopathy. Reproductive:No pathologic findings. Other: No ascites or pneumoperitoneum. Small supraumbilical fat hernia. Musculoskeletal: No acute abnormalities. IMPRESSION: Bilateral perinephric stranding and probable urothelial thickening, suspect bilateral pyelonephritis in this setting. No hydronephrosis or stone. Fatty liver. Electronically Signed   By: Monte Fantasia M.D.   On: 03/31/2021 09:12   ECHO TEE  Result Date: 04/04/2021    TRANSESOPHOGEAL ECHO REPORT   Patient Name:   Katie Serrano Date of Exam: 04/04/2021 Medical Rec #:  [id number]       Height:       66.0 in Accession #:    [id number]      Weight:       185.0 lb Date of Birth:  May 04, 1956       BSA:          1.934 m Patient Age:    10 years        BP:           154/87 mmHg Patient Gender: F               HR:           71 bpm. Exam Location:  Inpatient Procedure: 3D Echo, Cardiac  Doppler, Color Doppler, Saline Contrast Bubble Study            and Transesophageal Echo Indications:     Bacteremia R78.81  History:         Patient has prior history of Echocardiogram examinations, most                  recent 04/02/2021. CAD, COPD, Signs/Symptoms:Fever and Fatigue;                  Risk Factors:Hypertension, Diabetes, Dyslipidemia and Current                  Smoker.  Sonographer:      Jonelle Sidle Dance Referring Phys:  [id number] Jeanella Flattery AMIN Diagnosing Phys: Eleonore Chiquito MD PROCEDURE: After discussion of the risks and benefits of a TEE, an informed consent was obtained from the patient. TEE procedure time was 10 minutes. The transesophogeal probe was passed without difficulty through the esophogus of the patient. Imaged were obtained with the patient in a left lateral decubitus position. Local oropharyngeal anesthetic was provided with Cetacaine. Sedation performed by different physician. The patient was monitored while under deep sedation. Anesthestetic sedation was provided intravenously by Anesthesiology: 63m of Propofol, 1065mof Lidocaine. Image quality was excellent. The patient's vital signs; including heart rate, blood pressure, and oxygen saturation; remained stable throughout the procedure. The patient developed no complications during the procedure. IMPRESSIONS  1. Left ventricular ejection fraction, by estimation, is 55 to 60%. The left ventricle has normal function.  2. Right ventricular systolic function is normal. The right ventricular size is normal.  3. No left atrial/left atrial appendage thrombus was detected.  4. The mitral valve is grossly normal. Mild mitral valve regurgitation. No evidence of mitral stenosis.  5. The aortic valve is tricuspid. Aortic valve regurgitation is not visualized. No aortic stenosis is present.  6. There is mild (Grade II) layered plaque involving the descending aorta.  7. Agitated saline contrast bubble study was negative, with no evidence of any interatrial shunt. Conclusion(s)/Recommendation(s): No evidence of vegetation/infective endocarditis on this transesophageal echocardiogram. FINDINGS  Left Ventricle: Left ventricular ejection fraction, by estimation, is 55 to 60%. The left ventricle has normal function. The left ventricular internal cavity size was normal in size. Right Ventricle: The right ventricular size is normal. No increase in  right ventricular wall thickness. Right ventricular systolic function is normal. Left Atrium: Left atrial size was normal in size. No left atrial/left atrial appendage thrombus was detected. Right Atrium: Right atrial size was normal in size. Pericardium: Trivial pericardial effusion is present. Mitral Valve: The mitral valve is grossly normal. Mild mitral valve regurgitation. No evidence of mitral valve stenosis. There is no evidence of mitral valve vegetation. Tricuspid Valve: The tricuspid valve is normal in structure. Tricuspid valve regurgitation is trivial. No evidence of tricuspid stenosis. There is no evidence of tricuspid valve vegetation. Aortic Valve: The aortic valve is tricuspid. Aortic valve regurgitation is not visualized. No aortic stenosis is present. There is no evidence of aortic valve vegetation. Pulmonic Valve: The pulmonic valve was normal in structure. Pulmonic valve regurgitation is trivial. No evidence of pulmonic stenosis. There is no evidence of pulmonic valve vegetation. Aorta: The aortic root and ascending aorta are structurally normal, with no evidence of dilitation. There is mild (Grade II) layered plaque involving the descending aorta. Venous: The left upper pulmonary vein, left lower pulmonary vein, right upper pulmonary vein and right lower pulmonary vein are normal. IAS/Shunts: No  atrial level shunt detected by color flow Doppler. Agitated saline contrast was given intravenously to evaluate for intracardiac shunting. Agitated saline contrast bubble study was negative, with no evidence of any interatrial shunt.   AORTA Ao Root diam: 2.86 cm Ao Asc diam:  3.27 cm TRICUSPID VALVE TR Peak grad:   16.5 mmHg TR Vmax:        203.00 cm/s Eleonore Chiquito MD Electronically signed by Eleonore Chiquito MD Signature Date/Time: 04/04/2021/1:31:34 PM    Final    [place] EKG SITE RITE  Result Date: 04/08/2021 If Site Rite image not attached, placement could not be confirmed due to current cardiac  rhythm.  CT ANGIO HEAD CODE STROKE  Result Date: 04/05/2021 CLINICAL DATA:  Follow-up examination for acute stroke. EXAM: CT ANGIOGRAPHY HEAD AND NECK TECHNIQUE: Multidetector CT imaging of the head and neck was performed using the standard protocol during bolus administration of intravenous contrast. Multiplanar CT image reconstructions and MIPs were obtained to evaluate the vascular anatomy. Carotid stenosis measurements (when applicable) are obtained utilizing NASCET criteria, using the distal internal carotid diameter as the denominator. CONTRAST:  13m OMNIPAQUE IOHEXOL 350 MG/ML SOLN COMPARISON:  MRI from 04/03/2021. FINDINGS: CT HEAD FINDINGS Brain: Previously identified scattered subcentimeter acute ischemic infarcts not well visualized by CT. No other acute large vessel territory infarct. No intracranial hemorrhage. No mass lesion, mass effect, or midline shift. No hydrocephalus or extra-axial fluid collection. Vascular: No hyperdense vessel. Skull: Scalp soft tissues and calvarium within normal limits. Sinuses: Paranasal sinuses and mastoid air cells are clear. Orbits: Globes and orbital soft tissues demonstrate no acute finding. Review of the MIP images confirms the above findings CTA NECK FINDINGS Aortic arch: Visualized aortic arch normal caliber with normal branch pattern. Mild atheromatous change about the visualized arch. No hemodynamically significant stenosis seen about the origin the great vessels. Right carotid system: Right common and internal carotid arteries widely patent without stenosis, dissection or occlusion. Left carotid system: Left common and internal carotid arteries patent without stenosis, dissection or occlusion. Vertebral arteries: Left vertebral artery arises directly from the aortic arch. Vertebral arteries widely patent within the neck without stenosis, dissection or occlusion. Skeleton: Prior ACDF at C5-C7. No discrete or worrisome osseous lesions. Patient is edentulous.  Other neck: No other acute soft tissue abnormality within the neck. No mass or adenopathy. Upper chest: Visualized upper chest demonstrates no acute finding. Review of the MIP images confirms the above findings CTA HEAD FINDINGS Anterior circulation: Petrous segments patent bilaterally. Mild atheromatous change within the carotid siphons without significant stenosis. A1 segments widely patent. Normal anterior communicating artery complex. Anterior cerebral arteries patent to their distal aspects without stenosis. No M1 stenosis or occlusion. Normal MCA bifurcations. Distal MCA branches well perfused and symmetric. Posterior circulation: Both V4 segments patent to the vertebrobasilar junction without stenosis. Both PICA origins patent and normal. Basilar widely patent to its distal aspect without stenosis. Superior cerebellar arteries patent bilaterally. Both PCAs primarily supplied via the basilar and are well perfused to there distal aspects. Venous sinuses: Grossly patent allowing for timing the contrast bolus. Anatomic variants: None significant.  No aneurysm. Review of the MIP images confirms the above findings IMPRESSION: CT HEAD IMPRESSION: 1. Previously identified scattered subcentimeter ischemic infarcts not well visualized by CT. 2. No other new acute intracranial abnormality. CTA HEAD AND NECK IMPRESSION: Negative CTA of the head and neck. No large vessel occlusion. No hemodynamically significant or correctable stenosis. Electronically Signed   By: BPincus BadderD.  On: 04/05/2021 00:20   CT ANGIO NECK CODE STROKE  Result Date: 04/05/2021 CLINICAL DATA:  Follow-up examination for acute stroke. EXAM: CT ANGIOGRAPHY HEAD AND NECK TECHNIQUE: Multidetector CT imaging of the head and neck was performed using the standard protocol during bolus administration of intravenous contrast. Multiplanar CT image reconstructions and MIPs were obtained to evaluate the vascular anatomy. Carotid stenosis  measurements (when applicable) are obtained utilizing NASCET criteria, using the distal internal carotid diameter as the denominator. CONTRAST:  36m OMNIPAQUE IOHEXOL 350 MG/ML SOLN COMPARISON:  MRI from 04/03/2021. FINDINGS: CT HEAD FINDINGS Brain: Previously identified scattered subcentimeter acute ischemic infarcts not well visualized by CT. No other acute large vessel territory infarct. No intracranial hemorrhage. No mass lesion, mass effect, or midline shift. No hydrocephalus or extra-axial fluid collection. Vascular: No hyperdense vessel. Skull: Scalp soft tissues and calvarium within normal limits. Sinuses: Paranasal sinuses and mastoid air cells are clear. Orbits: Globes and orbital soft tissues demonstrate no acute finding. Review of the MIP images confirms the above findings CTA NECK FINDINGS Aortic arch: Visualized aortic arch normal caliber with normal branch pattern. Mild atheromatous change about the visualized arch. No hemodynamically significant stenosis seen about the origin the great vessels. Right carotid system: Right common and internal carotid arteries widely patent without stenosis, dissection or occlusion. Left carotid system: Left common and internal carotid arteries patent without stenosis, dissection or occlusion. Vertebral arteries: Left vertebral artery arises directly from the aortic arch. Vertebral arteries widely patent within the neck without stenosis, dissection or occlusion. Skeleton: Prior ACDF at C5-C7. No discrete or worrisome osseous lesions. Patient is edentulous. Other neck: No other acute soft tissue abnormality within the neck. No mass or adenopathy. Upper chest: Visualized upper chest demonstrates no acute finding. Review of the MIP images confirms the above findings CTA HEAD FINDINGS Anterior circulation: Petrous segments patent bilaterally. Mild atheromatous change within the carotid siphons without significant stenosis. A1 segments widely patent. Normal anterior  communicating artery complex. Anterior cerebral arteries patent to their distal aspects without stenosis. No M1 stenosis or occlusion. Normal MCA bifurcations. Distal MCA branches well perfused and symmetric. Posterior circulation: Both V4 segments patent to the vertebrobasilar junction without stenosis. Both PICA origins patent and normal. Basilar widely patent to its distal aspect without stenosis. Superior cerebellar arteries patent bilaterally. Both PCAs primarily supplied via the basilar and are well perfused to there distal aspects. Venous sinuses: Grossly patent allowing for timing the contrast bolus. Anatomic variants: None significant.  No aneurysm. Review of the MIP images confirms the above findings IMPRESSION: CT HEAD IMPRESSION: 1. Previously identified scattered subcentimeter ischemic infarcts not well visualized by CT. 2. No other new acute intracranial abnormality. CTA HEAD AND NECK IMPRESSION: Negative CTA of the head and neck. No large vessel occlusion. No hemodynamically significant or correctable stenosis. Electronically Signed   By: BJeannine BogaM.D.   On: 04/05/2021 00:20   MR ORBITS W WO CONTRAST  Result Date: 04/03/2021 CLINICAL DATA:  Initial evaluation for acute headache, papilledema, history of MRSA bacteremia. EXAM: MRI HEAD AND ORBITS WITHOUT AND WITH CONTRAST TECHNIQUE: Multiplanar, multiecho pulse sequences of the brain and surrounding structures were obtained without and with intravenous contrast. Multiplanar, multiecho pulse sequences of the orbits and surrounding structures were obtained including fat saturation techniques, before and after intravenous contrast administration. CONTRAST:  8.563mGADAVIST GADOBUTROL 1 MMOL/ML IV SOLN COMPARISON:  Prior CT from 05/02/2011. FINDINGS: MRI HEAD FINDINGS Brain: Cerebral volume within normal limits for age. Scattered patchy T2/FLAIR hyperintensity  seen involving the periventricular, deep, and subcortical white matter both  cerebral hemispheres as well as the pons, most likely related chronic microvascular ischemic disease, moderate in nature. 7 mm acute ischemic infarcts seen involving the left caudate head (series 2, image 30). Few additional scattered subcentimeter ischemic infarcts noted involving the cortical and subcortical left parietal lobe (series 2, images 34, 33) as well as the left splenium (series 2, image 27). Suspected additional subtle acute to subacute ischemic infarct noted involving the right temporal lobe (series 2, image 19). Suspected subtle associated enhancement about the right temporal infarct (series 17, image 11). No other associated enhancement. No mass effect or associated hemorrhage. Otherwise, gray-white matter differentiation maintained. No encephalomalacia to suggest chronic cortical infarction elsewhere within the brain. No other foci of susceptibility artifact to suggest acute or chronic intracranial hemorrhage. No mass lesion, midline shift or mass effect. No hydrocephalus or extra-axial fluid collection. No intraventricular effusion or debris. Pituitary gland suprasellar region within normal limits. Midline structures intact and normal. On corresponding orbit portion of this exam, there is question of diffuse left a meningeal enhancement involving both cerebral hemispheres (series 14, image 1). Finding not entirely certain, and could be technical in nature. No other abnormal enhancement. Vascular: Major intracranial vascular flow voids are well maintained. Skull and upper cervical spine: Craniocervical junction within normal limits. Bone marrow signal intensity normal. No scalp soft tissue abnormality. Other: Trace left mastoid effusion, of doubtful significance. Inner ear structures grossly normal. MRI ORBITS FINDINGS Orbits: Globes are symmetric in size with normal appearance and morphology bilaterally. Apparent asymmetric FLAIR signal abnormality involving the right globe felt to be artifactual  on this exam, with no signal abnormality seen on corresponding sequences. Optic nerves within normal limits without intrinsic edema or abnormal enhancement. No abnormality about the optic nerve sheaths. Intraconal and extraconal fat well-maintained. Extra-ocular muscles symmetric and within normal limits. Lacrimal glands normal. No abnormality about the orbital apices. Cavernous sinus within normal limits. Superior orbital veins symmetric and unremarkable. Optic chiasm normally situated within the suprasellar cistern without abnormality. No evidence for acute infection about either orbit. Question diffuse leptomeningeal enhancement involving the visualized cerebral hemispheres as above. Visualized sinuses: Scattered mucosal thickening noted about the ethmoidal air cells. Visualized paranasal sinuses are otherwise clear. Soft tissues: Unremarkable. IMPRESSION: 1. 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 No associated hemorrhage or mass effect. 2. Question diffuse leptomeningeal enhancement involving both cerebral hemispheres as above. Finding not entirely certain, and could be technical in nature on this exam. However, possible meningitis should be considered in the setting of MRSA bacteremia and scattered ischemic infarcts. Correlation with LP and CSF studies recommended for further evaluation. 3. No other acute intracranial abnormality. No other evidence for acute CNS or intraorbital infection. 4. Moderate chronic microvascular ischemic disease. Electronically Signed   By: Jeannine Boga M.D.   On: 04/03/2021 22:10    TEE Right knee bedside arthrocentesis then arthrotomy and drainage, drain placement 04/05/2021 Dr. Lyla Glassing.  Subjective: Feels better, pain is improving,no fever or bleeding. Ready to continue intensive rehabilitation  Discharge Exam: Vitals:   04/10/21 0557  04/10/21 0826  BP: (!) 157/79 135/65  Pulse: 79 73  Resp: 17   Temp: 98.5 F (36.9 C)   SpO2: 91%    General: Pt is alert, awake, not in acute distress Cardiovascular: RRR, S1/S2 +, no rubs, no gallops Respiratory: CTA bilaterally, no wheezing,  no rhonchi Abdominal: Soft, NT, ND, bowel sounds + Extremities: No edema, no cyanosis. R knee with c/d/I dressing, no surrounding erythema but remains warm and tender without palpable effusion or baker's cyst. Distal motor function improved significantly from prior.  Labs: BNP (last 3 results) No results for input(s): BNP in the last 8760 hours. Basic Metabolic Panel: Recent Labs  Lab 04/04/21 0359 04/05/21 0322 04/05/21 1802 04/08/21 0430 04/10/21 0400  NA 135 136  --  134* 135  K 3.4* 3.3*  --  3.7 3.6  CL 101 98  --  97* 98  CO2 26 29  --  30 28  GLUCOSE 221* 218*  --  217* 147*  BUN 11 10  --  5* 6*  CREATININE 0.81 0.82 0.85 0.72 0.72  CALCIUM 8.7* 8.6*  --  8.4* 8.4*  MG 2.0 2.0  --   --   --    Liver Function Tests: No results for input(s): AST, ALT, ALKPHOS, BILITOT, PROT, ALBUMIN in the last 168 hours. No results for input(s): LIPASE, AMYLASE in the last 168 hours. No results for input(s): AMMONIA in the last 168 hours. CBC: Recent Labs  Lab 04/04/21 0359 04/05/21 0322 04/05/21 1802 04/08/21 0430 04/10/21 0400  WBC 18.0* 19.7* 23.4* 18.3* 17.9*  HGB 12.8 12.1 12.5 11.9* 11.3*  HCT 37.0 36.4 36.7 35.6* 34.7*  MCV 84.1 86.1 86.4 86.0 87.4  PLT 401* 447* 499* 493* 543*   Cardiac Enzymes: No results for input(s): CKTOTAL, CKMB, CKMBINDEX, TROPONINI in the last 168 hours. BNP: Invalid input(s): POCBNP CBG: Recent Labs  Lab 04/09/21 0605 04/09/21 1106 04/09/21 1629 04/09/21 2114 04/10/21 0555  GLUCAP 204* 352* 243* 148* 155*   D-Dimer No results for input(s): DDIMER in the last 72 hours. Hgb A1c No results for input(s): HGBA1C in the last 72 hours. Lipid Profile No results for input(s): CHOL, HDL,  LDLCALC, TRIG, CHOLHDL, LDLDIRECT in the last 72 hours. Thyroid function studies No results for input(s): TSH, T4TOTAL, T3FREE, THYROIDAB in the last 72 hours.  Invalid input(s): FREET3 Anemia work up No results for input(s): VITAMINB12, FOLATE, FERRITIN, TIBC, IRON, RETICCTPCT in the last 72 hours. Urinalysis    Component Value Date/Time   COLORURINE YELLOW 03/31/2021 0239   APPEARANCEUR CLOUDY (A) 03/31/2021 0239   LABSPEC 1.009 03/31/2021 0239   PHURINE 5.0 03/31/2021 0239   GLUCOSEU >=500 (A) 03/31/2021 0239   HGBUR SMALL (A) 03/31/2021 0239   BILIRUBINUR NEGATIVE 03/31/2021 0239   KETONESUR 20 (A) 03/31/2021 0239   PROTEINUR 100 (A) 03/31/2021 0239   NITRITE NEGATIVE 03/31/2021 0239   LEUKOCYTESUR MODERATE (A) 03/31/2021 0239    Microbiology Recent Results (from the past 240 hour(s))  Culture, blood (Routine X 2) w Reflex to ID Panel     Status: Abnormal   Collection Time: 04/01/21 11:08 AM   Specimen: BLOOD  Result Value Ref Range Status   Specimen Description BLOOD LEFT ANTECUBITAL  Final   Special Requests   Final    BOTTLES DRAWN AEROBIC AND ANAEROBIC Blood Culture adequate volume   Culture  Setup Time   Final    GRAM POSITIVE COCCI IN CLUSTERS IN BOTH AEROBIC AND ANAEROBIC BOTTLES Organism ID to follow CRITICAL RESULT CALLED TO, READ BACK BY AND VERIFIED WITH: PARMD MIRANDA BRYK BY MESSAN H. AT [phone] 02 7 27 2022 Performed at [facility], [place] [address], [place], [place] [address]    Culture METHICILLIN RESISTANT STAPHYLOCOCCUS AUREUS (A)  Final   Report Status  04/04/2021 FINAL  Final   Organism ID, Bacteria METHICILLIN RESISTANT STAPHYLOCOCCUS AUREUS  Final      Susceptibility   Methicillin resistant staphylococcus aureus - MIC*    CIPROFLOXACIN >=8 RESISTANT Resistant     ERYTHROMYCIN >=8 RESISTANT Resistant     GENTAMICIN <=0.5 SENSITIVE Sensitive     OXACILLIN >=4 RESISTANT Resistant     TETRACYCLINE <=1 SENSITIVE Sensitive     VANCOMYCIN 1  SENSITIVE Sensitive     TRIMETH/SULFA <=10 SENSITIVE Sensitive     CLINDAMYCIN <=0.25 SENSITIVE Sensitive     RIFAMPIN <=0.5 SENSITIVE Sensitive     Inducible Clindamycin NEGATIVE Sensitive     * METHICILLIN RESISTANT STAPHYLOCOCCUS AUREUS  Blood Culture ID Panel (Reflexed)     Status: Abnormal   Collection Time: 04/01/21 11:08 AM  Result Value Ref Range Status   Enterococcus faecalis NOT DETECTED NOT DETECTED Final   Enterococcus Faecium NOT DETECTED NOT DETECTED Final   Listeria monocytogenes NOT DETECTED NOT DETECTED Final   Staphylococcus species DETECTED (A) NOT DETECTED Final    Comment: CRITICAL RESULT CALLED TO, READ BACK BY AND VERIFIED WITH: PARMD MIRANDA BRYK BY MESSAN H. AT 0552 02 7 27 2022    Staphylococcus aureus (BCID) DETECTED (A) NOT DETECTED Final    Comment: Methicillin (oxacillin)-resistant Staphylococcus aureus (MRSA). MRSA is predictably resistant to beta-lactam antibiotics (except ceftaroline). Preferred therapy is vancomycin unless clinically contraindicated. Patient requires contact precautions if  hospitalized. CRITICAL RESULT CALLED TO, READ BACK BY AND VERIFIED WITH: PARMD MIRANDA BRYK BY MESSAN H. AT 0552 02 7 27 2022    Staphylococcus epidermidis NOT DETECTED NOT DETECTED Final   Staphylococcus lugdunensis NOT DETECTED NOT DETECTED Final   Streptococcus species NOT DETECTED NOT DETECTED Final   Streptococcus agalactiae NOT DETECTED NOT DETECTED Final   Streptococcus pneumoniae NOT DETECTED NOT DETECTED Final   Streptococcus pyogenes NOT DETECTED NOT DETECTED Final   A.calcoaceticus-baumannii NOT DETECTED NOT DETECTED Final   Bacteroides fragilis NOT DETECTED NOT DETECTED Final   Enterobacterales NOT DETECTED NOT DETECTED Final   Enterobacter cloacae complex NOT DETECTED NOT DETECTED Final   Escherichia coli NOT DETECTED NOT DETECTED Final   Klebsiella aerogenes NOT DETECTED NOT DETECTED Final   Klebsiella oxytoca NOT DETECTED NOT DETECTED Final    Klebsiella pneumoniae NOT DETECTED NOT DETECTED Final   Proteus species NOT DETECTED NOT DETECTED Final   Salmonella species NOT DETECTED NOT DETECTED Final   Serratia marcescens NOT DETECTED NOT DETECTED Final   Haemophilus influenzae NOT DETECTED NOT DETECTED Final   Neisseria meningitidis NOT DETECTED NOT DETECTED Final   Pseudomonas aeruginosa NOT DETECTED NOT DETECTED Final   Stenotrophomonas maltophilia NOT DETECTED NOT DETECTED Final   Candida albicans NOT DETECTED NOT DETECTED Final   Candida auris NOT DETECTED NOT DETECTED Final   Candida glabrata NOT DETECTED NOT DETECTED Final   Candida krusei NOT DETECTED NOT DETECTED Final   Candida parapsilosis NOT DETECTED NOT DETECTED Final   Candida tropicalis NOT DETECTED NOT DETECTED Final   Cryptococcus neoformans/gattii NOT DETECTED NOT DETECTED Final   Meth resistant mecA/C and MREJ DETECTED (A) NOT DETECTED Final    Comment: CRITICAL RESULT CALLED TO, READ BACK BY AND VERIFIED WITH: PARMD MIRANDA BRYK BY MESSAN H. AT 1610 02 7 27 2022 Performed at [facility], [address] [place], [place] [address]   Culture, blood (Routine X 2) w Reflex to ID Panel     Status: Abnormal   Collection Time: 04/01/21 11:18 AM  Specimen: BLOOD RIGHT HAND  Result Value Ref Range Status   Specimen Description BLOOD RIGHT HAND  Final   Special Requests   Final    BOTTLES DRAWN AEROBIC ONLY Blood Culture adequate volume   Culture  Setup Time   Final    GRAM POSITIVE COCCI IN CLUSTERS AEROBIC BOTTLE ONLY CRITICAL VALUE NOTED.  VALUE IS CONSISTENT WITH PREVIOUSLY REPORTED AND CALLED VALUE.    Culture (A)  Final    STAPHYLOCOCCUS AUREUS SUSCEPTIBILITIES PERFORMED ON PREVIOUS CULTURE WITHIN THE LAST 5 DAYS. Performed at [facility], [place] [address] [place], [place] [address]    Report Status 04/04/2021 FINAL  Final  Culture, blood (Routine X 2) w Reflex to ID Panel     Status: None   Collection Time: 04/02/21 11:50 PM   Specimen:  BLOOD RIGHT HAND  Result Value Ref Range Status   Specimen Description BLOOD RIGHT HAND  Final   Special Requests   Final    BOTTLES DRAWN AEROBIC ONLY Blood Culture adequate volume   Culture   Final    NO GROWTH 5 DAYS Performed at [facility], [place] [address], [place], [place] [address]    Report Status 04/08/2021 FINAL  Final  Culture, blood (Routine X 2) w Reflex to ID Panel     Status: None   Collection Time: 04/03/21 12:09 AM   Specimen: BLOOD LEFT HAND  Result Value Ref Range Status   Specimen Description BLOOD LEFT HAND  Final   Special Requests   Final    BOTTLES DRAWN AEROBIC ONLY Blood Culture adequate volume   Culture   Final    NO GROWTH 5 DAYS Performed at [facility], [place] [address] [place], [place] [address]    Report Status 04/08/2021 FINAL  Final  Body fluid culture w Gram Stain     Status: None   Collection Time: 04/05/21 10:52 AM   Specimen: Synovium; Synovial Fluid  Result Value Ref Range Status   Specimen Description SYNOVIAL FLUID  Final   Special Requests RIGHT KNEE  Final   Gram Stain   Final    ABUNDANT WBC PRESENT, PREDOMINANTLY PMN NO ORGANISMS SEEN Gram Stain Report Called to,Read Back By and Verified With: Delfino Lovett MD @1425  04/05/21 EB      Culture   Final    RARE METHICILLIN RESISTANT STAPHYLOCOCCUS AUREUS CRITICAL RESULT CALLED TO, READ BACK BY AND VERIFIED WITH: RN H.MCINTOSH AT 1024 ON 04/07/2021 BY T.SAAD. Performed at [facility], [place] [address] [place],  [address]    Report Status 04/08/2021 FINAL  Final   Organism ID, Bacteria METHICILLIN RESISTANT STAPHYLOCOCCUS AUREUS  Final      Susceptibility   Methicillin resistant staphylococcus aureus - MIC*    CIPROFLOXACIN >=8 RESISTANT Resistant     ERYTHROMYCIN >=8 RESISTANT Resistant     GENTAMICIN <=0.5 SENSITIVE Sensitive     OXACILLIN >=4 RESISTANT Resistant     TETRACYCLINE <=1 SENSITIVE Sensitive     VANCOMYCIN <=0.5 SENSITIVE Sensitive     TRIMETH/SULFA  <=10 SENSITIVE Sensitive     CLINDAMYCIN <=0.25 SENSITIVE Sensitive     RIFAMPIN <=0.5 SENSITIVE Sensitive     Inducible Clindamycin NEGATIVE Sensitive     * RARE METHICILLIN RESISTANT STAPHYLOCOCCUS AUREUS  Anaerobic culture w Gram Stain     Status: None (Preliminary result)   Collection Time: 04/05/21 10:52 AM   Specimen: Synovium; Synovial Fluid  Result Value Ref Range Status   Specimen  Description SYNOVIAL FLUID  Final   Special Requests RIGHT KNEE  Final   Gram Stain   Final    RARE WBC PRESENT,BOTH PMN AND MONONUCLEAR RARE GRAM POSITIVE COCCI IN PAIRS Performed at [facility], [address] [place], [place] [address]    Culture   Final    NO ANAEROBES ISOLATED; CULTURE IN PROGRESS FOR 5 DAYS   Report Status PENDING  Incomplete  Aerobic/Anaerobic Culture w Gram Stain (surgical/deep wound)     Status: None (Preliminary result)   Collection Time: 04/05/21  4:07 PM   Specimen: Tissue  Result Value Ref Range Status   Specimen Description TISSUE  Final   Special Requests RIGHT KNEE SYNOVIUM [place] A  Final   Gram Stain   Final    RARE WBC PRESENT, PREDOMINANTLY MONONUCLEAR NO ORGANISMS SEEN Performed at [facility], [place] [address] [place], [place] [address]    Culture   Final    RARE METHICILLIN RESISTANT STAPHYLOCOCCUS AUREUS NO ANAEROBES ISOLATED; CULTURE IN PROGRESS FOR 5 DAYS    Report Status PENDING  Incomplete   Organism ID, Bacteria METHICILLIN RESISTANT STAPHYLOCOCCUS AUREUS  Final      Susceptibility   Methicillin resistant staphylococcus aureus - MIC*    CIPROFLOXACIN >=8 RESISTANT Resistant     ERYTHROMYCIN >=8 RESISTANT Resistant     GENTAMICIN <=0.5 SENSITIVE Sensitive     OXACILLIN >=4 RESISTANT Resistant     TETRACYCLINE <=1 SENSITIVE Sensitive     VANCOMYCIN 1 SENSITIVE Sensitive     TRIMETH/SULFA <=10 SENSITIVE Sensitive     CLINDAMYCIN <=0.25 SENSITIVE Sensitive     RIFAMPIN <=0.5 SENSITIVE Sensitive     Inducible Clindamycin NEGATIVE  Sensitive     * RARE METHICILLIN RESISTANT STAPHYLOCOCCUS AUREUS    Time coordinating discharge: Approximately 40 minutes  Patrecia Pour, MD  [facility] 04/10/2021, 10:07 AM

## 2021-04-10 NOTE — Progress Notes (Signed)
PMR Admission Coordinator Pre-Admission Assessment  Patient: Katie Serrano is an 65 y.o., female MRN: [id number] DOB: 11/22/1955 Height: 5' 6" (167.6 cm) Weight: 80.7 kg  Insurance Information HMO:     PPO:      PCP:      IPA:      80/20:      OTHER:  PRIMARY: Medicare A/B      Policy#: [id number]      Subscriber: pt CM Name:       Phone#:      Fax#:  Pre-Cert#: verified online      Employer:  Benefits:  Phone #:      Name:  Eff. Date: 06/07/2020 A and B     Deduct: $1556      Out of Pocket Max: n/a      Life Max: n/a CIR: 100%      SNF: 20 full days Outpatient: 80%     Co-Pay: 20% Home Health: 100%      Co-Pay:  DME: 80%     Co-Pay: 20% Providers: pt choice SECONDARY: [employer] Policy#: [id number]     Phone#:   [phone]      Financial Counselor:       Phone#:   The "Data Collection Information Summary" for patients in Inpatient Rehabilitation Facilities with attached "Privacy Act Statement-Health Care Records" was provided and verbally reviewed with: Patient and Family  Emergency Contact Information Contact Information     Name Relation Home Work Mobile   Lauritsen,Michael Spouse [phone]  [phone]   Goodin,Tara Daughter   [phone]       Current Medical History  Patient Admitting Diagnosis: debility 2/2 sepsis and myositis  History of Present Illness: Pt is a 65 y/o female who presents to [facility] on 03/30/21 with fever 105*F and malaise found to have sepsis due to MRSA UTI and bacteremia.  PMH HTN, asthma, toacco use, COPD, DM, HDL, and OA.  TTE and TEE negative for vegetation.  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  Neurosurgery recommended no surgical intervention.  Brain MRI on 7/28 showed scattered infarcts involving hte left caudate head, L parietal lobe, left splenium, and right temporal lobe as well as diffuse leptomeningeal  enhancement concerning for meningitis.  Neurology was consulted and recommended continuing abx for sepsis as this would be the same treatment for meningitis.  Pt's right knee developed swelling and pain and was found to have septic arthritis so arthrotomy and drainage performing.  Plan to continue IV Abx x6 weeks.  Therapy evaluations were completed and pt was recommended for CIR.     Patient's medical record from [facility] has been reviewed by the rehabilitation admission coordinator and physician.  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   Elevated C-reactive protein (CRP) 01/15/2021   Encounter for long-term current use of high risk medication 01/29/2016   Essential hypertension 01/29/2016   Gastroesophageal reflux disease 01/29/2016   Intermittent asthma without complication 01/13/2021   Mixed hyperlipidemia 05/01/2019   Osteoarthritis of multiple joints 01/29/2016   Shortness of breath 01/13/2021   Tobacco use disorder 12/02/2017   Type 2 diabetes mellitus without complication, without long-term current use of insulin (HCC) 01/30/2016     Last Assessment & Plan:  Formatting of this note might be different from the original. Relevant Hx: Course: Daily Update: Today's Plan:discussed with her that her sugars will increase and she has actively worked on her diet and diet and lost from 242 to 188 which I commend her on, she is to continue with her weight loss efforts despite taking this   Electronically signed by: Melissa Joyce Brown-Pa    Family History   family history includes Angina in her father; Diabetes in her mother and paternal grandmother; Heart attack (age of onset: 65) in her father; Heart attack (age of onset: 74) in her mother.  Prior Rehab/Hospitalizations Has the patient had prior rehab or hospitalizations prior  to admission? No  Has the patient had major surgery during 100 days prior to admission? Yes   Current Medications  Current Facility-Administered Medications:    [START ON 04/09/2021] amLODipine (NORVASC) tablet 10 mg, 10 mg, Oral, Daily, Grunz, Ryan B, MD   bisacodyl (DULCOLAX) suppository 10 mg, 10 mg, Rectal, Once, Grunz, Ryan B, MD   dextromethorphan-guaiFENesin (MUCINEX DM) 30-600 MG per 12 hr tablet 1 tablet, 1 tablet, Oral, BID PRN, Swinteck, Brian, MD   docusate sodium (COLACE) capsule 100 mg, 100 mg, Oral, BID, Swinteck, Brian, MD, 100 mg at 04/08/21 0931   enoxaparin (LOVENOX) injection 40 mg, 40 mg, Subcutaneous, Q24H, Swinteck, Brian, MD, 40 mg at 04/08/21 0931   hydrALAZINE (APRESOLINE) injection 10 mg, 10 mg, Intravenous, Q4H PRN, Swinteck, Brian, MD, 10 mg at 04/05/21 0634   insulin aspart (novoLOG) injection 0-15 Units, 0-15 Units, Subcutaneous, TID WC, Swinteck, Brian, MD, 5 Units at 04/08/21 1118   insulin aspart (novoLOG) injection 0-5 Units, 0-5 Units, Subcutaneous, QHS, Swinteck, Brian, MD, 2 Units at 04/07/21 2206   insulin aspart (novoLOG) injection 7 Units, 7 Units, Subcutaneous, TID WC, Grunz, Ryan B, MD   [START ON 04/09/2021] insulin glargine-yfgn (SEMGLEE) injection 20 Units, 20 Units, Subcutaneous, Daily, Grunz, Ryan B, MD   ipratropium (ATROVENT) 0.03 % nasal spray 2 spray, 2 spray, Each Nare, BID, Grunz, Ryan B, MD   ipratropium-albuterol (DUONEB) 0.5-2.5 (3) MG/3ML nebulizer solution 3 mL, 3 mL, Nebulization, Q4H PRN, Swinteck, Brian, MD   losartan (COZAAR) tablet 50 mg, 50 mg, Oral, QHS, Swinteck, Brian, MD, 50 mg at 04/07/21 2205   menthol-cetylpyridinium (CEPACOL) lozenge 3 mg, 1 lozenge, Oral, PRN, Grunz, Ryan B, MD   metoCLOPramide (REGLAN) tablet 5-10 mg, 5-10 mg, Oral, Q8H PRN **OR** metoCLOPramide (REGLAN) injection 5-10 mg, 5-10 mg, Intravenous, Q8H PRN, Swinteck, Brian, MD   metoprolol tartrate (LOPRESSOR) tablet 50 mg, 50 mg, Oral, BID, Grunz, Ryan B, MD,  50 mg at 04/08/21 0932   omega-3 acid ethyl esters (LOVAZA) capsule 1 g, 1 g, Oral, Daily, Swinteck, Brian, MD, 1 g at 04/08/21 0931   ondansetron (ZOFRAN) tablet 4 mg, 4 mg, Oral, Q6H PRN **OR** ondansetron (ZOFRAN) injection 4 mg, 4 mg, Intravenous, Q6H PRN, Swinteck, Brian, MD   oxyCODONE (Oxy IR/ROXICODONE) immediate release tablet 5 mg, 5 mg, Oral, Q4H PRN, Swinteck, Brian, MD, 5 mg at 04/08/21 0636   phenol (CHLORASEPTIC) mouth spray 1 spray, 1 spray, Mouth/Throat, PRN, Grunz, Ryan B, MD   polyethylene glycol (MIRALAX / GLYCOLAX) packet 17 g, 17 g, Oral, Daily, Grunz, Ryan B, MD, 17 g at 04/08/21 0931   senna (SENOKOT) tablet 17.2 mg, 2 tablet, Oral, BID, Grunz, Ryan B, MD, 17.2 mg at 04/08/21 0931   vancomycin (VANCOREADY) IVPB 750 mg/150 mL, 750 mg, Intravenous, Q8H,   Carney, Jessica C, RPH, Last Rate: 150 mL/hr at 04/08/21 0636, 750 mg at 04/08/21 0636  Patients Current Diet:  Diet Order             Diet Carb Modified Fluid consistency: Thin; Room service appropriate? Yes  Diet effective now                   Precautions / Restrictions Precautions Precautions: Fall Precaution Comments: drain to R knee, mod fall Restrictions Weight Bearing Restrictions: Yes RLE Weight Bearing: Weight bearing as tolerated   Has the patient had 2 or more falls or a fall with injury in the past year? No  Prior Activity Level    Prior Functional Level Self Care: Did the patient need help bathing, dressing, using the toilet or eating? Independent  Indoor Mobility: Did the patient need assistance with walking from room to room (with or without device)? Independent  Stairs: Did the patient need assistance with internal or external stairs (with or without device)? Independent  Functional Cognition: Did the patient need help planning regular tasks such as shopping or remembering to take medications? Independent  Home Assistive Devices / Equipment Home Assistive Devices/Equipment:  Eyeglasses, Dentures (specify type) Home Equipment: Shower seat - built in  Prior Device Use: Indicate devices/aids used by the patient prior to current illness, exacerbation or injury? None of the above  Current Functional Level Cognition  Overall Cognitive Status: Within Functional Limits for tasks assessed Orientation Level: Oriented X4 Following Commands: Follows one step commands consistently Safety/Judgement: Decreased awareness of safety, Decreased awareness of deficits General Comments: Anxious to attempt any movement due to pain    Extremity Assessment (includes Sensation/Coordination)  Upper Extremity Assessment: Generalized weakness RUE Deficits / Details: 4-/5 MMT shoulder flexion/extension, grip strength LUE Deficits / Details: 3+/5 MMT shoulder flexion/extension, grip strength  Lower Extremity Assessment: Defer to PT evaluation    ADLs  Overall ADL's : Needs assistance/impaired Eating/Feeding: Independent, Bed level Grooming: Wash/dry hands, Wash/dry face, Applying deodorant, Oral care, Bed level, Set up Upper Body Bathing: Set up, Bed level Lower Body Bathing: Maximal assistance, Bed level Upper Body Dressing : Sitting, Set up Upper Body Dressing Details (indicate cue type and reason): long sitting in bed Lower Body Dressing: Maximal assistance, Bed level Toilet Transfer: Total assistance (bed pan) Toileting- Clothing Manipulation and Hygiene: Maximal assistance, Bed level Toileting - Clothing Manipulation Details (indicate cue type and reason): Min A for balance during clothing management and wiping Functional mobility during ADLs: Minimal assistance (bed level only hthis session) General ADL Comments: Pt unable to sit EOB this session due to pain in R knee with movement. ADLs completed at bed level    Mobility  Overal bed mobility: Needs Assistance Bed Mobility: Supine to Sit, Sit to Supine Rolling: Min assist Sidelying to sit: Mod assist Supine to sit: Mod  assist, HOB elevated Sit to supine: Mod assist Sit to sidelying: Min assist General bed mobility comments: Patient requires assist from husband to manage R LE when moving in bed due to pain. Has good ability to use UEs to move in bed when cued. Patient able to bring R LE close to floor with support from sheet under it.    Transfers  Overall transfer level: Needs assistance Equipment used: Rolling walker (2 wheeled) Transfers: Sit to/from Stand Sit to Stand: Min assist, From elevated surface General transfer comment: Patient able to stand briefly at edge of bed with min assist. R LE kept off floor due to pain.      Ambulation / Gait / Stairs / Wheelchair Mobility  Ambulation/Gait Ambulation/Gait assistance: Min assist Gait Distance (Feet): 10 Feet (seated/toileted; 10) Assistive device: Rolling walker (2 wheeled) Gait Pattern/deviations: Step-to pattern, Decreased step length - right, Decreased step length - left, Shuffle General Gait Details: Unable due to pain Gait velocity: slowed Gait velocity interpretation: <1.31 ft/sec, indicative of household ambulator    Posture / Balance Dynamic Sitting Balance Sitting balance - Comments: able to balance sitting at edge of bed with support to right LE Balance Overall balance assessment: Needs assistance Sitting-balance support: Feet supported, Bilateral upper extremity supported Sitting balance-Leahy Scale: Good Sitting balance - Comments: able to balance sitting at edge of bed with support to right LE Standing balance support: Bilateral upper extremity supported Standing balance-Leahy Scale: Fair Standing balance comment: able to stand briefly with min guard assist and RW    Special needs/care consideration Diabetic management yes   Previous Home Environment (from acute therapy documentation) Living Arrangements: Spouse/significant other Available Help at Discharge: Family, Available 24 hours/day Type of Home: House Home Layout: One  level Home Access: Stairs to enter Entrance Stairs-Rails: Can reach both Entrance Stairs-Number of Steps: 3 Bathroom Shower/Tub: Walk-in shower, Tub/shower unit Bathroom Toilet: Standard Home Care Services: No Additional Comments: Pt husband present and supportive throughout session  Discharge Living Setting Plans for Discharge Living Setting: Patient's home, Lives with (comment) (spouse) Type of Home at Discharge: House Discharge Home Layout: One level Discharge Home Access: Stairs to enter Entrance Stairs-Rails: Can reach both Entrance Stairs-Number of Steps: 3 Discharge Bathroom Shower/Tub: Walk-in shower, Tub/shower unit Discharge Bathroom Toilet: Standard Discharge Bathroom Accessibility: Yes How Accessible: Accessible via walker Does the patient have any problems obtaining your medications?: No  Social/Family/Support Systems Patient Roles: Spouse Anticipated Caregiver: spouse, Michael [place] Anticipated Caregiver's Contact Information: [phone] Ability/Limitations of Caregiver: min assist Caregiver Availability: 24/7 Discharge Plan Discussed with Primary Caregiver: Yes Is Caregiver In Agreement with Plan?: Yes Does Caregiver/Family have Issues with Lodging/Transportation while Pt is in Rehab?: No  Goals Patient/Family Goal for Rehab: PT/OT mod I, SLP n/a Expected length of stay: 10-14 days Pt/Family Agrees to Admission and willing to participate: Yes Program Orientation Provided & Reviewed with Pt/Caregiver Including Roles  & Responsibilities: Yes  Decrease burden of Care through IP rehab admission: n/a  Possible need for SNF placement upon discharge: No  Patient Condition: I have reviewed medical records from [facility], spoken with CM, and patient and spouse. I met with patient at the bedside for inpatient rehabilitation assessment.  Patient will benefit from ongoing PT and OT, can actively participate in 3 hours of therapy a day 5 days of the week, and can  make measurable gains during the admission.  Patient will also benefit from the coordinated team approach during an Inpatient Acute Rehabilitation admission.  The patient will receive intensive therapy as well as Rehabilitation physician, nursing, social worker, and care management interventions.  Due to safety, disease management, medication administration, pain management, and patient education the patient requires 24 hour a day rehabilitation nursing.  The patient is currently mod +2 with mobility and basic ADLs.  Discharge setting and therapy post discharge at home with home health is anticipated.  Patient has agreed to participate in the Acute Inpatient Rehabilitation Program and will admit today.  Preadmission Screen Completed By:  Kitana Gage E Damyiah Moxley, PT, DPT 04/08/2021 1:27 PM ______________________________________________________________________   Discussed status with Dr. Raulkar on 04/10/21  at 10:11 AM  and received approval for admission today.  Admission   The patient is currently mod +2 with mobility and basic ADLs.  Discharge setting and therapy post discharge at home with home health is anticipated.  Patient has agreed to participate in the Acute Inpatient Rehabilitation Program and will admit today.   Preadmission Screen Completed By:  Michel Santee, PT, DPT 04/08/2021 1:27 PM ______________________________________________________________________   Discussed status with Dr. Ranell Patrick on 04/10/21  at 10:11 AM  and received approval for admission today.   Admission Coordinator:  Michel Santee, PT, DPT time 10:11 AM Sudie Grumbling 04/10/21     Assessment/Plan: Diagnosis: MRSA bacteremia Does the need for close, 24 hr/day Medical supervision in concert with the patient's rehab needs make it unreasonable for this patient to be served in a less intensive setting? Yes Co-Morbidities requiring supervision/potential complications: overweight (BMI 28.73), paraspinal myositis, essential hypertension, mized hyperlipidemia, type 2 DM with hyperglycemia Due to bladder management, bowel management, safety, skin/wound care, disease management, medication administration, pain management, and patient education, does the patient require 24 hr/day rehab nursing? Yes Does the patient require coordinated care of a physician, rehab nurse, PT, OT to address physical and functional deficits in the context of the above medical diagnosis(es)? Yes Addressing deficits  in the following areas: balance, endurance, locomotion, strength, transferring, bowel/bladder control, bathing, dressing, feeding, grooming, toileting, and psychosocial support Can the patient actively participate in an intensive therapy program of at least 3 hrs of therapy 5 days a week? Yes The potential for patient to make measurable gains while on inpatient rehab is excellent Anticipated functional outcomes upon discharge from inpatient rehab: min assist PT, min assist OT, independent SLP Estimated rehab length of stay to reach the above functional goals is: 2-3 weeks Anticipated discharge destination: Home 10. Overall Rehab/Functional Prognosis: excellent     MD Signature: Leeroy Cha, MD

## 2021-04-10 NOTE — Progress Notes (Signed)
Patient arrived on unit, oriented to unit. Reviewed medications, therapy schedule, rehab routine and plan of care. States an understanding of information reviewed. No complications noted at this time. Patient reports no pain and is AX4 Katie Serrano.   

## 2021-04-10 NOTE — Progress Notes (Signed)
Inpatient Rehab Admissions Coordinator:   I have a bed available for pt to admit to CIR today.  Dr. Jarvis Newcomer in agreement and [facility] aware.  Will let pt/family know.   Estill Dooms, PT, DPT Admissions Coordinator [phone] 04/10/21  10:01 AM

## 2021-04-10 NOTE — H&P (Signed)
Physical Medicine and Rehabilitation Admission H&P    Chief Complaint  Patient presents with   Debility     HPI: Katie Serrano is a 65 year old female with history of HTN, COPD/Asthma, T2DM, CAD, OA who was admitted on 03/30/21 with weakness, fevers and left flank pain due to  sepsis from MRSA UTI with bacteremia. CT abdomen pelvis showed bilateral pyelonephritis and was negative for hydronephrosis. ID consulted for input and expressed concerns of epidural abscess with impingement causing BLE weakness.  MRI spine done showing 2 cystic collections lesions about left L4-L5 facet favored to be acute facet arthritis possible septic arthritis as well as secondary mass effect on L5 nerve root, abnormal edema with enhancement of left greater than right paraspinous muscles suggestive of acute myositis that could be infectious or inflammatory in nature versus injury or strain.  Dr. Jake Samples was consulted for input and felt that no significant neural compression seen or drainable abscess.  Medical management and treatment with antibiotics recommended and neurosurgery signed off.  TEE showed EF of 55 to 60% with mild grade 2.  Plaque involving descending aorta, no aortic renal thrombus and was negative for endocarditis.  Patient also complained of throbbing headaches of her right eye on 07/29 and neurology was consulted for input. MRI brain done showing few scattered subcentimeter acute ischemic nonhemorrhagic infarcts involving left caudate head, left parietal lobe, left splenium and right temporal lobe with question of diffuse leptomeningeal enhancement involving both cerebral hemispheres with question of meningitis.  CTA head/neck done showing no large vessel occlusion or significant stenosis.  Dr. Otelia Limes was consulted for input on embolic stroke and did did not recommend LP as patient had already been on antibiotics, without nuchal rigidity and would not change course of treatment.   He questioned A. fib as  cause of him embolic  phenomena and recommended treatment with lengthy antibiotic course due to concerns of septic emboli to brain.     She developed right knee swelling with pain due to septic arthritis due to MRSA positive cultures. She was taken to OR for I & D of knee by Dr. Linna Caprice on 07/30. Dr. Drue Second recommends 8 week course  Vanc for possible L4/5 discitis with early osteo, CNS septic embolic and septic arthritis.  Electrolyte abnormalities resolving with downward trend in WBC. She continues to be limited by knee pain with weakness, generalized malaise and noted to have decreased activity tolerance. Therapy ongoing and working on standing attempts. CIR recommended due to functional decline. She currently complains of right lower extremity weakness.   Review of Systems  Constitutional:  Negative for chills and fever.       Gets hot and flushed for past few days--new  HENT:  Negative for hearing loss and tinnitus.   Eyes:  Negative for blurred vision and double vision.  Respiratory:  Negative for cough and shortness of breath.   Cardiovascular:  Negative for chest pain, palpitations and leg swelling.  Gastrointestinal:  Positive for constipation. Negative for heartburn and nausea.  Genitourinary:  Negative for dysuria and urgency.  Musculoskeletal:  Positive for back pain, joint pain and myalgias.  Neurological:  Positive for sensory change (at times in feet), weakness and headaches (over right eye on and off).  Psychiatric/Behavioral:  Negative for depression. The patient has insomnia. The patient is not nervous/anxious.     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 Relevant Hx: Course: Daily Update: Today's Plan:discussed with her that her sugars will increase and she has actively worked on her diet and diet and lost from 242 to 188 which I commend her on, she is to continue with her weight loss efforts despite taking this   Electronically signed by: Sandi Mealy    Past Surgical History:  Procedure Laterality Date   APPENDECTOMY     BUBBLE STUDY  04/04/2021   Procedure: BUBBLE STUDY;  Surgeon: Sande Rives, MD;  Location: [facility] ENDOSCOPY;  Service: Cardiovascular;;   BUNIONECTOMY Right    I & D EXTREMITY Right 04/05/2021   Procedure: IRRIGATION AND DEBRIDEMENT KNEE;  Surgeon: Samson Frederic, MD;  Location: [facility] OR;  Service: Orthopedics;  Laterality: Right;   LEFT HEART CATH AND CORONARY ANGIOGRAPHY N/A 03/12/2021   Procedure: LEFT HEART CATH AND CORONARY ANGIOGRAPHY;  Surgeon: Lennette Bihari, MD;  Location: [facility] INVASIVE CV LAB;  Service: Cardiovascular;  Laterality: N/A;   NECK SURGERY     SMALL INTESTINE SURGERY     TEE WITHOUT CARDIOVERSION N/A 04/04/2021   Procedure: TRANSESOPHAGEAL ECHOCARDITRRROGRAM (TEE);  Surgeon: Sande Rives, MD;  Location: [facility] ENDOSCOPY;  Service: Cardiovascular;  Laterality: N/A;   TONSILLECTOMY      Family History  Problem Relation Age of Onset   Diabetes Mother    Heart attack Mother 70   Angina Father    Heart attack Father 32   Diabetes Paternal Grandmother     Social History:  Married. Retired [employer]. She  reports that she has been  smoking cigarettes. She has been smoking an average of .5 packs per day. She has never used smokeless tobacco. She reports current alcohol use of about 1.0 standard drink of alcohol on occasion. She reports that she does not use drugs.   Allergies: No Known Allergies   Facility-Administered Medications Prior to Admission  Medication Dose Route Frequency Provider Last Rate Last Admin   sodium chloride flush (NS) 0.9 % injection 3 mL  3 mL Intravenous Q12H Baldo Daub, MD       Medications Prior to Admission  Medication Sig Dispense Refill   albuterol (VENTOLIN HFA) 108 (90 Base) MCG/ACT inhaler Inhale 2 puffs into the lungs every 6 (six) hours as needed for wheezing or shortness of breath.     Cinnamon 500 MG capsule Take 500 mg by mouth daily.     Green Tea 150 MG CAPS Take 150 mg by mouth daily.     losartan (COZAAR) 50 MG tablet Take 50 mg by mouth at bedtime.     metFORMIN (GLUCOPHAGE-XR) 500 MG 24 hr tablet Take 500 mg by mouth 2 (two) times daily.     Multiple Vitamin (MULTIVITAMIN) capsule Take 1 capsule by mouth daily.     nitroGLYCERIN (NITROSTAT) 0.4 MG SL tablet Place 1 tablet (0.4 mg  total) under the tongue every 5 (five) minutes as needed. 25 tablet 3   Omega-3 1000 MG CAPS Take 1,000 mg by mouth daily.     [DISCONTINUED] aspirin 81 MG chewable tablet Chew 81 mg by mouth daily.     [DISCONTINUED] ibuprofen (ADVIL) 800 MG tablet Take 800 mg by mouth every 6 (six) hours as needed for pain.     [DISCONTINUED] metoprolol tartrate (LOPRESSOR) 25 MG tablet Take 1 tablet (25 mg total) by mouth 2 (two) times daily. 180 tablet 3   atorvastatin (LIPITOR) 80 MG tablet Take 1 tablet (80 mg total) by mouth daily. (Patient not taking: No sig reported) 90 tablet 1   [DISCONTINUED] amLODipine (NORVASC) 5 MG tablet Take 1 tablet (5 mg total) by mouth daily. (Patient not taking: No sig reported) 90 tablet 1    Drug Regimen Review  Drug regimen was reviewed and remains appropriate with no  significant issues identified  Home: Home Living Family/patient expects to be discharged to:: Private residence Living Arrangements: Spouse/significant other Available Help at Discharge: Family, Available 24 hours/day Type of Home: House Home Access: Stairs to enter Secretary/administratorntrance Stairs-Number of Steps: 3 Entrance Stairs-Rails: Can reach both Home Layout: One level Bathroom Shower/Tub: Psychologist, counsellingWalk-in shower, Engineer, manufacturing systemsTub/shower unit Bathroom Toilet: Standard Home Equipment: Information systems managerhower seat - built in Additional Comments: Pt husband present and supportive throughout session   Functional History: Prior Function Level of Independence: Independent Comments: Pt watches grandchildren and great grand children daily, also likes to garden  Functional Status:  Mobility: Bed Mobility Overal bed mobility: Needs Assistance Bed Mobility: Supine to Sit, Sit to Supine Rolling: Min assist Sidelying to sit: Mod assist Supine to sit: HOB elevated Sit to supine: Min assist Sit to sidelying: Min assist General bed mobility comments: min A for management of RLE, and one tug of the bed pad to adjust hips Transfers Overall transfer level: Needs assistance Equipment used: None Transfers: Squat Pivot Transfers Sit to Stand: Min assist, From elevated surface Squat pivot transfers: Mod assist, +2 physical assistance General transfer comment: pt declined this session Ambulation/Gait Ambulation/Gait assistance: Min assist Gait Distance (Feet): 10 Feet (seated/toileted; 10) Assistive device: Rolling walker (2 wheeled) Gait Pattern/deviations: Step-to pattern, Decreased step length - right, Decreased step length - left, Shuffle General Gait Details: Unable due to pain Gait velocity: slowed Gait velocity interpretation: <1.31 ft/sec, indicative of household ambulator    ADL: ADL Overall ADL's : Needs assistance/impaired Eating/Feeding: Independent, Bed level Grooming: Wash/dry face, Oral care, Brushing hair Grooming  Details (indicate cue type and reason): pt completed grooming tasks while sitting EOB wtih R foot supported on a chair Upper Body Bathing: Set up, Bed level Lower Body Bathing: Maximal assistance, Bed level Upper Body Dressing : Sitting, Set up Upper Body Dressing Details (indicate cue type and reason): long sitting in bed Lower Body Dressing: Maximal assistance, Bed level Toilet Transfer: Total assistance (bed pan) Toileting- Clothing Manipulation and Hygiene: Maximal assistance, Bed level Toileting - Clothing Manipulation Details (indicate cue type and reason): Min A for balance during clothing management and wiping Functional mobility during ADLs: Minimal assistance (limited to bed) General ADL Comments: Pt able to get to EOB this session wtih incrased time, rest breaks for pain managment and her husband assisting her RLE  Cognition: Cognition Overall Cognitive Status: Within Functional Limits for tasks assessed Orientation Level: Oriented X4 Cognition Arousal/Alertness: Awake/alert Behavior During Therapy: WFL for tasks assessed/performed Overall Cognitive Status: Within Functional Limits for tasks assessed Area of Impairment: Safety/judgement Following Commands:  Follows one step commands consistently Safety/Judgement: Decreased awareness of safety, Decreased awareness of deficits Problem Solving: Requires verbal cues, Requires tactile cues General Comments: anxious but motivated today  Physical Exam: Blood pressure 135/68, pulse 65, temperature 98.3 F (36.8 C), temperature source Oral, resp. rate 18, height 5\' 6"  (1.676 m), weight 80.7 kg, SpO2 95 %. Physical Exam: Gen: no distress, normal appearing HEENT: oral mucosa pink and moist, NCAT Cardio: Reg rate Chest: normal effort, normal rate of breathing Abd: soft, non-distended Ext: no edema Psych: pleasant, normal affect Skin: see MSK Musculoskeletal:     Comments: Right knee incision C/D with staples in place. Drain site  scabbed over. Right side 0/5 HF and KE, 5/5 DF/PF Neurological:     Mental Status: She is alert and oriented to person, place, and time.  Psychiatric:        Mood and Affect: Mood normal.   Results for orders placed or performed during the hospital encounter of 03/30/21 (from the past 48 hour(s))  Glucose, capillary     Status: Abnormal   Collection Time: 04/08/21  3:56 PM  Result Value Ref Range   Glucose-Capillary 169 (H) 70 - 99 mg/dL    Comment: Glucose reference range applies only to samples taken after fasting for at least 8 hours.  Glucose, capillary     Status: Abnormal   Collection Time: 04/08/21  9:05 PM  Result Value Ref Range   Glucose-Capillary 117 (H) 70 - 99 mg/dL    Comment: Glucose reference range applies only to samples taken after fasting for at least 8 hours.  Glucose, capillary     Status: Abnormal   Collection Time: 04/09/21  6:05 AM  Result Value Ref Range   Glucose-Capillary 204 (H) 70 - 99 mg/dL    Comment: Glucose reference range applies only to samples taken after fasting for at least 8 hours.  Glucose, capillary     Status: Abnormal   Collection Time: 04/09/21 11:06 AM  Result Value Ref Range   Glucose-Capillary 352 (H) 70 - 99 mg/dL    Comment: Glucose reference range applies only to samples taken after fasting for at least 8 hours.   Comment 1 Notify RN   Glucose, capillary     Status: Abnormal   Collection Time: 04/09/21  4:29 PM  Result Value Ref Range   Glucose-Capillary 243 (H) 70 - 99 mg/dL    Comment: Glucose reference range applies only to samples taken after fasting for at least 8 hours.  Sedimentation rate     Status: Abnormal   Collection Time: 04/09/21  5:00 PM  Result Value Ref Range   Sed Rate 78 (H) 0 - 22 mm/hr    Comment: Performed at [facility], [address] [place], [place]  C-reactive protein     Status: Abnormal   Collection Time: 04/09/21  5:00 PM  Result Value Ref Range   CRP 11.6 (H) <1.0 mg/dL     Comment: Performed at [facility], [address] [place], [place]  Glucose, capillary     Status: Abnormal   Collection Time: 04/09/21  9:14 PM  Result Value Ref Range   Glucose-Capillary 148 (H) 70 - 99 mg/dL    Comment: Glucose reference range applies only to samples taken after fasting for at least 8 hours.  CBC     Status: Abnormal   Collection Time: 04/10/21  4:00 AM  Result Value Ref Range   WBC 17.9 (H)  4.0 - 10.5 K/uL   RBC 3.97 3.87 - 5.11 MIL/uL   Hemoglobin 11.3 (L) 12.0 - 15.0 g/dL   HCT 16.1 (L) 09.6 - 04.5 %   MCV 87.4 80.0 - 100.0 fL   MCH 28.5 26.0 - 34.0 pg   MCHC 32.6 30.0 - 36.0 g/dL   RDW 40.9 81.1 - 91.4 %   Platelets 543 (H) 150 - 400 K/uL   nRBC 0.0 0.0 - 0.2 %    Comment: Performed at [facility], [address] [place], [place] [address]  Basic metabolic panel     Status: Abnormal   Collection Time: 04/10/21  4:00 AM  Result Value Ref Range   Sodium 135 135 - 145 mmol/L   Potassium 3.6 3.5 - 5.1 mmol/L   Chloride 98 98 - 111 mmol/L   CO2 28 22 - 32 mmol/L   Glucose, Bld 147 (H) 70 - 99 mg/dL    Comment: Glucose reference range applies only to samples taken after fasting for at least 8 hours.   BUN 6 (L) 8 - 23 mg/dL   Creatinine, Ser 6.21 0.44 - 1.00 mg/dL   Calcium 8.4 (L) 8.9 - 10.3 mg/dL   GFR, Estimated >30 >86 mL/min    Comment: (NOTE) Calculated using the CKD-EPI Creatinine Equation (2021)    Anion gap 9 5 - 15    Comment: Performed at [facility], [address] [place], [place] [address]  Glucose, capillary     Status: Abnormal   Collection Time: 04/10/21  5:55 AM  Result Value Ref Range   Glucose-Capillary 155 (H) 70 - 99 mg/dL    Comment: Glucose reference range applies only to samples taken after fasting for at least 8 hours.  Glucose, capillary     Status: Abnormal   Collection Time: 04/10/21 11:24 AM  Result Value Ref Range   Glucose-Capillary 184 (H) 70 - 99 mg/dL    Comment: Glucose reference  range applies only to samples taken after fasting for at least 8 hours.   No results found.     Medical Problem List and Plan: 1.  Paraspinal myositis, MRSA bacteremia  -patient may shower but incisions must be covered  -ELOS/Goals: 2-3 weeks MinA  -Admit to CIR 2.  Antithrombotics: -DVT/anticoagulation:  Pharmaceutical: Lovenox  -antiplatelet therapy: N/a 3. Pain Management: Throbbing/jabbing at knee/thigh --Oxycodone prn not effective  --will add Oxycontin for more consistent relief. Robaxin prn for muscle aches.  4. Mood: LCSW to follow for evaluation and support.   -antipsychotic agents: N/A 5. Neuropsych: This patient is capable of making decisions on her own behalf. 6. Skin/Wound Care: Routine pressure relief measures.  7. Fluids/Electrolytes/Nutrition: Monitor I/O. Check CMET in am.  8. Disseminated MRSA infection: Vancomycin w/July 30th as day #1 for 6-8 weeks followed by extended oral course.  --weekly Sed rate, CRP, CMET, CBC  --WBC trending down 24.5-->17.9 9. T2DM: Hgb A1C- 9.8 and poorly controlled.   --Monitor BS ac/hs and use SSI for tighter control.   --Continue Insulin glargine 20 units with 9 units meal coverage.   10. HTN: Monitor BP TID- continues to be labile --continue hydralazine, Norvasc, Losartan and metoprolol  11. Severe constipation: Resolved  --continue Senna S - 2 tabs bid w/miralax daily 12. Septic Knee s/p debridement: WBAT with KI when up.  I have personally performed a face to face diagnostic evaluation, including, but not limited to relevant history and physical exam findings, of this patient and developed relevant  assessment and plan.  Additionally, I have reviewed and concur with the physician assistant's documentation above.  Jacquelynn Cree, PA-C 04/10/2021   Sula Soda, MD

## 2021-04-10 NOTE — Progress Notes (Signed)
Inpatient Rehabilitation Medication Review by a Pharmacist  A complete drug regimen review was completed for this patient to identify any potential clinically significant medication issues.  Clinically significant medication issues were identified:  yes   Type of Medication Issue Identified Description of Issue Urgent (address now) Non-Urgent (address on AM team rounds) Plan Plan Accepted by Provider? (Yes / No / Pending AM Rounds)  Drug Interaction(s) (clinically significant)       Duplicate Therapy       Allergy       No Medication Administration End Date       Incorrect Dose       Additional Drug Therapy Needed  Per Dr Riley Kill note, --continue hydralazine, Norvasc, Losartan and metoprolol. Hydralazine not continued in current meds    Follow up with am rounds  Other         Name of provider notified for urgent issues identified:   Provider Method of Notification:    For non-urgent medication issues to be resolved on team rounds tomorrow morning a [facility] Secure Chat Handoff was sent to:    Pharmacist comments:   Time spent performing this drug regimen review (minutes):  20   Catori Panozzo 04/10/2021 8:39 PM

## 2021-04-10 NOTE — Progress Notes (Signed)
Inpatient Rehabilitation  Patient information reviewed and entered into eRehab system by Deforrest Bogle M. Arrietty Dercole, M.A., CCC/SLP, PPS Coordinator.  Information including medical coding, functional ability and quality indicators will be reviewed and updated through discharge.    

## 2021-04-11 DIAGNOSIS — R5381 Other malaise: Secondary | ICD-10-CM | POA: Diagnosis not present

## 2021-04-11 LAB — GLUCOSE, CAPILLARY
Glucose-Capillary: 189 mg/dL — ABNORMAL HIGH (ref 70–99)
Glucose-Capillary: 101 mg/dL — ABNORMAL HIGH (ref 70–99)
Glucose-Capillary: 241 mg/dL — ABNORMAL HIGH (ref 70–99)
Glucose-Capillary: 162 mg/dL — ABNORMAL HIGH (ref 70–99)

## 2021-04-11 LAB — CBC WITH DIFFERENTIAL/PLATELET
Abs Immature Granulocytes: 0.11 10*3/uL — ABNORMAL HIGH (ref 0.00–0.07)
Basophils Absolute: 0.1 10*3/uL (ref 0.0–0.1)
Basophils Relative: 0 %
Eosinophils Absolute: 0.2 10*3/uL (ref 0.0–0.5)
Eosinophils Relative: 1 %
HCT: 33.1 % — ABNORMAL LOW (ref 36.0–46.0)
Hemoglobin: 10.8 g/dL — ABNORMAL LOW (ref 12.0–15.0)
Immature Granulocytes: 1 %
Lymphocytes Relative: 14 %
Lymphs Abs: 2.2 10*3/uL (ref 0.7–4.0)
MCH: 28.6 pg (ref 26.0–34.0)
MCHC: 32.6 g/dL (ref 30.0–36.0)
MCV: 87.8 fL (ref 80.0–100.0)
Monocytes Absolute: 1.3 10*3/uL — ABNORMAL HIGH (ref 0.1–1.0)
Monocytes Relative: 8 %
Neutro Abs: 12.8 10*3/uL — ABNORMAL HIGH (ref 1.7–7.7)
Neutrophils Relative %: 76 %
Platelets: 535 10*3/uL — ABNORMAL HIGH (ref 150–400)
RBC: 3.77 MIL/uL — ABNORMAL LOW (ref 3.87–5.11)
RDW: 13.9 % (ref 11.5–15.5)
WBC: 16.6 10*3/uL — ABNORMAL HIGH (ref 4.0–10.5)
nRBC: 0 % (ref 0.0–0.2)

## 2021-04-11 LAB — COMPREHENSIVE METABOLIC PANEL
ALT: 16 U/L (ref 0–44)
AST: 12 U/L — ABNORMAL LOW (ref 15–41)
Albumin: 1.8 g/dL — ABNORMAL LOW (ref 3.5–5.0)
Alkaline Phosphatase: 70 U/L (ref 38–126)
Anion gap: 6 (ref 5–15)
BUN: 6 mg/dL — ABNORMAL LOW (ref 8–23)
CO2: 29 mmol/L (ref 22–32)
Calcium: 8.5 mg/dL — ABNORMAL LOW (ref 8.9–10.3)
Chloride: 102 mmol/L (ref 98–111)
Creatinine, Ser: 0.69 mg/dL (ref 0.44–1.00)
GFR, Estimated: >60 mL/min (ref >60)
Glucose, Bld: 184 mg/dL — ABNORMAL HIGH (ref 70–99)
Potassium: 3.9 mmol/L (ref 3.5–5.1)
Sodium: 137 mmol/L (ref 135–145)
Total Bilirubin: 0.4 mg/dL (ref 0.3–1.2)
Total Protein: 6.3 g/dL — ABNORMAL LOW (ref 6.5–8.1)

## 2021-04-11 LAB — VANCOMYCIN, PEAK: Vancomycin Pk: 23 ug/mL — ABNORMAL LOW (ref 30–40)

## 2021-04-11 LAB — VANCOMYCIN, TROUGH: Vancomycin Tr: 18 ug/mL (ref 15–20)

## 2021-04-11 MED ORDER — VANCOMYCIN HCL IN DEXTROSE 1-5 GM/200ML-% IV SOLN
1000.0000 mg | Freq: Two times a day (BID) | INTRAVENOUS | Status: DC
  Administered 2021-04-11 – 2021-04-18 (×14): 1000 mg via INTRAVENOUS
  Filled 2021-04-11 (×15): qty 200

## 2021-04-11 NOTE — Progress Notes (Signed)
PHARMACY CONSULT NOTE FOR:  OUTPATIENT  PARENTERAL ANTIBIOTIC THERAPY (OPAT)  Indication: MRSA bacteremia + suspected endocarditis Regimen: vancomycin 1000mg  IV q12h End date: 05/19/21  IV antibiotic discharge orders are pended. To discharging provider:  please sign these orders via discharge navigator,  Select New Orders & click on the button choice - Manage This Unsigned Work.     Thank you for allowing pharmacy to be a part of this patient's care.  07/19/21 04/11/2021, 9:54 AM

## 2021-04-11 NOTE — Evaluation (Signed)
Physical Therapy Assessment and Plan  Patient Details  Name: YONA KOSEK MRN: [id number] Date of Birth: Feb 26, 1956  PT Diagnosis: Abnormal posture, Abnormality of gait, Coordination disorder, Difficulty walking, Muscle weakness, Pain in joint, and Pain in RLE Rehab Potential: Good ELOS: 18-21 days   Today's Date: 04/11/2021 PT Individual Time: 0800-0902 PT Individual Time Calculation (min): 62 min    Hospital Problem: Principal Problem:   Myositis   Past Medical History:  Past Medical History:  Diagnosis Date   Class 1 obesity due to excess calories with serious comorbidity and body mass index (BMI) of 30.0 to 30.9 in adult 06/12/2019   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   Elevated C-reactive protein (CRP) 01/15/2021   Encounter for long-term current use of high risk medication 01/29/2016   Essential hypertension 01/29/2016   Gastroesophageal reflux disease 01/29/2016   Intermittent asthma without complication 04/11/4626   Mixed hyperlipidemia 05/01/2019   Osteoarthritis of multiple joints 01/29/2016   Shortness of breath 01/13/2021   Tobacco use disorder 12/02/2017   Type 2 diabetes mellitus without complication, without long-term current use of insulin ([place]) 01/30/2016   Last Assessment & Plan:  Formatting of this note might be different from the original. Relevant Hx: Course: Daily Update: Today's Plan:discussed with her that her sugars will increase and she has actively worked on her diet and diet and lost from 242 to 188 which I commend her on, she is to continue with her weight loss efforts despite taking this   Electronically signed by: Danie Chandler   Past Surgical History:  Past Surgical History:  Procedure Laterality Date   APPENDECTOMY     BUBBLE STUDY  04/04/2021   Procedure: BUBBLE STUDY;  Surgeon: Geralynn Rile, MD;  Location: [place];  Service: Cardiovascular;;   BUNIONECTOMY Right    I & D EXTREMITY Right 04/05/2021   Procedure: IRRIGATION AND DEBRIDEMENT  KNEE;  Surgeon: Rod Can, MD;  Location: [place];  Service: Orthopedics;  Laterality: Right;   LEFT HEART CATH AND CORONARY ANGIOGRAPHY N/A 03/12/2021   Procedure: LEFT HEART CATH AND CORONARY ANGIOGRAPHY;  Surgeon: Troy Sine, MD;  Location: [facility] [facility];  Service: Cardiovascular;  Laterality: N/A;   NECK SURGERY     SMALL INTESTINE SURGERY     TEE WITHOUT CARDIOVERSION N/A 04/04/2021   Procedure: TRANSESOPHAGEAL ECHOCARDITRRROGRAM (TEE);  Surgeon: Geralynn Rile, MD;  Location: [place];  Service: Cardiovascular;  Laterality: N/A;   TONSILLECTOMY      Assessment & Plan Clinical Impression: Patient is a 65 y.o. year old female with history of HTN, COPD/Asthma, T2DM, CAD, OA who was admitted on 03/30/21 with weakness, fevers and left flank pain due to  sepsis from MRSA UTI with bacteremia. CT abdomen pelvis showed bilateral pyelonephritis and was negative for hydronephrosis. ID consulted for input and expressed concerns of epidural abscess with impingement causing BLE weakness.  MRI spine done showing 2 cystic collections lesions about left L4-L5 facet favored to be acute facet arthritis possible septic arthritis as well as secondary mass effect on L5 nerve root, abnormal edema with enhancement of left greater than right paraspinous muscles suggestive of acute myositis that could be infectious or inflammatory in nature versus injury or strain.  Dr. Reatha Armour was consulted for input and felt that no significant neural compression seen or drainable abscess.  Medical management and treatment with antibiotics recommended and neurosurgery signed off.  TEE showed EF of 55 to 60% with mild grade 2.  Plaque involving descending  aorta, no aortic renal thrombus and was negative for endocarditis.   Patient also complained of throbbing headaches of her right eye on 07/29 and neurology was consulted for input. MRI brain done showing few scattered subcentimeter acute ischemic nonhemorrhagic  infarcts involving left caudate head, left parietal lobe, left splenium and right temporal lobe with question of diffuse leptomeningeal enhancement involving both cerebral hemispheres with question of meningitis.  CTA head/neck done showing no large vessel occlusion or significant stenosis.  Dr. Cheral Marker was consulted for input on embolic stroke and did did not recommend LP as patient had already been on antibiotics, without nuchal rigidity and would not change course of treatment.   He questioned A. fib as cause of him embolic  phenomena and recommended treatment with lengthy antibiotic course due to concerns of septic emboli to brain.      She developed right knee swelling with pain due to septic arthritis due to MRSA positive cultures. She was taken to OR for I & D of knee by Dr. Lyla Glassing on 07/30. Dr. Baxter Flattery recommends 8 week course  Vanc for possible L4/5 discitis with early osteo, CNS septic embolic and septic arthritis.  Electrolyte abnormalities resolving with downward trend in WBC. She continues to be limited by knee pain with weakness, generalized malaise and noted to have decreased activity tolerance. Therapy ongoing and working on standing attempts. CIR recommended due to functional decline. She currently complains of right lower extremity weakness.   Patient currently requires min with mobility secondary to muscle weakness, impaired timing and sequencing, unbalanced muscle activation, and decreased coordination, and decreased standing balance, decreased postural control, and decreased balance strategies.  Prior to hospitalization, patient was independent  with mobility and lived with Spouse in a House home.  Home access is 3Stairs to enter.  Patient will benefit from skilled PT intervention to maximize safe functional mobility, minimize fall risk, and decrease caregiver burden for planned discharge home with 24 hour assist.  Anticipate patient will benefit from follow up [facility] at discharge.  PT -  End of Session Activity Tolerance: Tolerates 30+ min activity with multiple rests Endurance Deficit: Yes Endurance Deficit Description: Fatigue with basic transfers and bed mobility, requires extra time and rest breaks PT Assessment Rehab Potential (ACUTE/IP ONLY): Good PT Barriers to Discharge: IV antibiotics;Wound Care;Other (comments) PT Barriers to Discharge Comments: pain PT Patient demonstrates impairments in the following area(s): Balance;Endurance;Motor;Pain;Skin Integrity PT Transfers Functional Problem(s): Bed Mobility;Bed to Chair;Car;Furniture PT Locomotion Functional Problem(s): Ambulation;Wheelchair Mobility;Stairs PT Plan PT Intensity: Minimum of 1-2 x/day ,45 to 90 minutes PT Frequency: 5 out of 7 days PT Duration Estimated Length of Stay: 18-21 days PT Treatment/Interventions: Ambulation/gait training;Community reintegration;DME/adaptive equipment instruction;Neuromuscular re-education;Psychosocial support;Stair training;UE/LE Strength taining/ROM;Wheelchair propulsion/positioning;Balance/vestibular training;Discharge planning;Pain management;Skin care/wound management;Therapeutic Activities;UE/LE Coordination activities;Disease management/prevention;Functional mobility training;Patient/family education;Therapeutic Exercise PT Transfers Anticipated Outcome(s): Supervision-CGA PT Locomotion Anticipated Outcome(s): Min A PT Recommendation Recommendations for Other Services: Therapeutic Recreation consult Therapeutic Recreation Interventions: Pet therapy;Kitchen group;Stress management;Outing/community reintergration Follow Up Recommendations: Home health PT Patient destination: Home Equipment Recommended: To be determined   PT Evaluation Precautions/Restrictions Precautions Precautions: Fall Required Braces or Orthoses: Knee Immobilizer - Right Knee Immobilizer - Right: Other (comment) Restrictions Weight Bearing Restrictions: Yes RLE Weight Bearing: Weight bearing as  tolerated Pain Pain Assessment Pain Scale: 0-10 Pain Score: 4  Pain Type: Acute pain Pain Location: Knee Pain Orientation: Right Pain Descriptors / Indicators: Aching Pain Intervention(s): Repositioned Home Living/Prior Functioning Home Living Living Arrangements: Spouse/significant other Available Help at Discharge: Family;Friend(s);Available 24 hours/day  Type of Home: House Home Access: Stairs to enter [employer] of Steps: 3 Entrance Stairs-Rails: Right;Left;Can reach both Home Layout: One level Bathroom Shower/Tub: Tub/shower unit;Walk-in shower Bathroom Toilet: Standard Bathroom Accessibility: Yes Additional Comments: Pt husband present and supportive throughout session  Lives With: Spouse Prior Function Level of Independence: Independent with basic ADLs;Independent with transfers;Independent with gait;Independent with homemaking with ambulation  Able to Take Stairs?: Yes Driving: Yes Vocation: Retired Comments: Pt watches grandchildren and great grand children daily, also likes to garden Cognition Overall Cognitive Status: Within Functional Limits for tasks assessed Arousal/Alertness: Awake/alert Safety awareness: Appears intact Sensation Sensation Light Touch: Appears Intact Coordination Gross Motor Movements are Fluid and Coordinated: No Coordination and Movement Description: Very slow, limited by RLE pain Finger Nose Finger Test: Mild dysmetria bilaterally Heel Shin Test: Unable to assess 2/2 pain Motor  Motor Motor: Other (comment) Motor - Skilled Clinical Observations: Significant RLE weakness 2/2 pain and global deconditioning, fear-avoidance behavior  Trunk/Postural Assessment  Cervical Assessment Cervical Assessment: Exceptions to [facility] (Forward head) Thoracic Assessment Thoracic Assessment: Exceptions to [facility] (Rounded shoulders) Lumbar Assessment Lumbar Assessment: Exceptions to [facility] (Posterior pelvic tilt) Postural Control Postural Control:  Deficits on evaluation (Simultaneous filing. User may not have seen previous data.) Trunk Control: Leans to L, requires BUE support Protective Responses: Impaired 2/2 RLE pain and fear-avoidance behavior  Balance Balance Balance Assessed: Yes Static Sitting Balance Static Sitting - Balance Support: Feet supported;Bilateral upper extremity supported Static Sitting - Level of Assistance: 5: Stand by assistance Dynamic Sitting Balance Dynamic Sitting - Balance Support: Feet supported Dynamic Sitting - Level of Assistance: 5: Stand by assistance Sitting balance - Comments: able to balance sitting at edge of bed with support to right LE Static Standing Balance Static Standing - Level of Assistance: 5: Stand by assistance Dynamic Standing Balance Dynamic Standing - Level of Assistance: 4: Min assist Extremity Assessment  RLE Assessment RLE Assessment: Exceptions to [facility] Passive Range of Motion (PROM) Comments: Unable to assess 2/2 pain RLE Strength Right Hip Flexion: 1/5 Right Hip ABduction: 2-/5 Right Hip ADduction: 2-/5 Right Knee Flexion: 1/5 Right Knee Extension: 1/5 Right Ankle Dorsiflexion: 4/5 Right Ankle Plantar Flexion: 4/5 LLE Assessment LLE Assessment: Exceptions to [facility] LLE Strength Left Hip Flexion: 4+/5 Left Hip Extension: 4+/5 Left Hip ABduction: 4+/5 Left Hip ADduction: 4+/5 Left Knee Flexion: 4+/5 Left Knee Extension: 4+/5 Left Ankle Dorsiflexion: 4+/5 Left Ankle Plantar Flexion: 4+/5  Care Tool Care Tool Bed Mobility Roll left and right activity   Roll left and right assist level: Contact Guard/Touching assist    Sit to lying activity   Sit to lying assist level: Moderate Assistance - Patient 50 - 74% (RLE management, bedrails, elevated HOB)    Lying to sitting edge of bed activity   Lying to sitting edge of bed assist level: Moderate Assistance - Patient 50 - 74% (RLE management, bedrails, elevated HOB)     Care Tool Transfers Sit to stand transfer    Sit to stand assist level: Minimal Assistance - Patient > 75% (RW, elevated bed)    Chair/bed transfer Chair/bed transfer activity did not occur: Safety/medical concerns (Pain, fatigue)       Toilet transfer   Assist Level: Moderate Assistance - Patient 50 - 74%    Car transfer Car transfer activity did not occur: Safety/medical concerns (Pain, fatigue)        Care Tool Locomotion Ambulation Ambulation activity did not occur: Safety/medical concerns (Pain, fatigue)  Walk 10 feet activity Walk 10 feet activity did not occur: Safety/medical concerns (Pain, fatigue)       Walk 50 feet with 2 turns activity Walk 50 feet with 2 turns activity did not occur: Safety/medical concerns (Pain, fatigue)      Walk 150 feet activity Walk 150 feet activity did not occur: Safety/medical concerns (Pain, fatigue)      Walk 10 feet on uneven surfaces activity Walk 10 feet on uneven surfaces activity did not occur: Safety/medical concerns (Pain, fatigue)      Stairs Stair activity did not occur: Safety/medical concerns (Pain, fatigue)        Walk up/down 1 step activity Walk up/down 1 step or curb (drop down) activity did not occur: Safety/medical concerns (Pain, fatigue)     Walk up/down 4 steps activity did not occuR: Safety/medical concerns (Pain, fatigue)  Walk up/down 4 steps activity      Walk up/down 12 steps activity Walk up/down 12 steps activity did not occur: Safety/medical concerns (Pain, fatigue)      Pick up small objects from floor Pick up small object from the floor (from standing position) activity did not occur: Safety/medical concerns (Pain, fatigue)      Wheelchair Will patient use wheelchair at discharge?: Yes Type of Wheelchair: Manual Wheelchair activity did not occur: Safety/medical concerns (Pain, fatigue)      Wheel 50 feet with 2 turns activity Wheelchair 50 feet with 2 turns activity did not occur: Safety/medical concerns (Pain, fatigue)    Wheel  150 feet activity Wheelchair 150 feet activity did not occur: Safety/medical concerns (Pain, fatigue)      Refer to Care Plan for Long Term Goals  SHORT TERM GOAL WEEK 1 PT Short Term Goal 1 (Week 1): Pt will perform supine <>sit EOB w/min A for improved bed mobility PT Short Term Goal 2 (Week 1): Pt will initiate gait training w/LRAD for improved functional mobility PT Short Term Goal 3 (Week 1): Pt will perform stand pivot w/LRAD and CGA for improved functional mobility  Recommendations for other services: Therapeutic Recreation  Pet therapy, Kitchen group, Stress management, and Outing/community reintegration  Skilled Therapeutic Intervention Evaluation completed (see details above and below) with education on PT POC and goals and individual treatment initiated with focus on bed mobility, transfers, pain management, and WC management. Pt received supine in bed receiving pain meds. Reported pain as 2/10 in RLE. Pt educated on 3hr/day therapy schedule in CIR and team conference, pt verbalized understanding. Pt rolled L & R with HOB flat and no bedrails w/CGA. Requested to void and voided continently in bedpan. Supine <>sit EOB w/mod A for RLE management and elevated HOB. At EOB, pt required BUE support for trunk control and demonstrated L trunk lean. Pt displays fear-avoidance behavior and required lots of encouragement to perform sit <>stand, but did so with elevated bed and RW w/min A. Pt stood for a minute w/BUE support on RW and CGA. Pt was left sitting EOB as handoff w/OT w/all needs in reach. Safety plan updated.   Mobility Bed Mobility Bed Mobility: Sit to Supine Rolling Right: Contact Guard/Touching assist Rolling Left: Contact Guard/Touching assist Supine to Sit: Moderate Assistance - Patient 50-74% (RLE management) Sitting - Scoot to Edge of Bed: Minimal Assistance - Patient > 75% Sit to Supine: Moderate Assistance - Patient 50-74% Transfers Transfers: Sit to Stand;Stand to  Sit;Stand Pivot Transfers Sit to Stand: Minimal Assistance - Patient > 75% Stand to Sit: Contact Guard/Touching assist Stand Pivot Transfers:  Moderate Assistance - Patient 50 - 74% Transfer (Assistive device): Rolling walker Locomotion  Gait Ambulation: No Gait Gait: No Stairs / Additional Locomotion Stairs: No Wheelchair Mobility Wheelchair Mobility: No   Discharge Criteria: Patient will be discharged from PT if patient refuses treatment 3 consecutive times without medical reason, if treatment goals not met, if there is a change in medical status, if patient makes no progress towards goals or if patient is discharged from hospital.  The above assessment, treatment plan, treatment alternatives and goals were discussed and mutually agreed upon: by patient  Cruzita Lederer Ananiah Maciolek, PT, DPT 04/11/2021, 12:21 PM

## 2021-04-11 NOTE — Progress Notes (Signed)
PROGRESS NOTE   Subjective/Complaints:  Some Low back pain this am , knee pain ok at rest hurts with movement  ROS- neg CP, SOB, N/V/D Objective:   No results found. Recent Labs    04/10/21 0400 04/11/21 0223  WBC 17.9* 16.6*  HGB 11.3* 10.8*  HCT 34.7* 33.1*  PLT 543* 535*   Recent Labs    04/10/21 0400 04/11/21 0223  NA 135 137  K 3.6 3.9  CL 98 102  CO2 28 29  GLUCOSE 147* 184*  BUN 6* 6*  CREATININE 0.72 0.69  CALCIUM 8.4* 8.5*    Intake/Output Summary (Last 24 hours) at 04/11/2021 0935 Last data filed at 04/11/2021 0428 Gross per 24 hour  Intake 150 ml  Output --  Net 150 ml        Physical Exam: Vital Signs Blood pressure (!) 151/74, pulse 72, temperature 98.7 F (37.1 C), temperature source Oral, resp. rate 16, weight 82.9 kg, SpO2 92 %.  General: No acute distress Mood and affect are appropriate Heart: Regular rate and rhythm no rubs murmurs or extra sounds Lungs: Clear to auscultation, breathing unlabored, no rales or wheezes Abdomen: Positive bowel sounds, soft nontender to palpation, nondistended Extremities: No clubbing, cyanosis, or edema Skin: No evidence of breakdown, no evidence of rash Neurologic: Cranial nerves II through XII intact, motor strength is 5/5 in bilateral deltoid, bicep, tricep, grip, hip flexor, 2- Righ tand 4/5 Left knee extensors, 4/5 B ankle dorsiflexor and plantar flexor Sensory exam normal sensation to light touch and proprioception in bilateral upper and lower extremities Cerebellar exam normal finger to nose to finger as well as heel to shin in bilateral upper and lower extremities Musculoskeletal: reduced ROM right knee with flexion,     Assessment/Plan: 1. Functional deficits which require 3+ hours per day of interdisciplinary therapy in a comprehensive inpatient rehab setting. Physiatrist is providing close team supervision and 24 hour management of active  medical problems listed below. Physiatrist and rehab team continue to assess barriers to discharge/monitor patient progress toward functional and medical goals  Care Tool:  Bathing              Bathing assist       Upper Body Dressing/Undressing Upper body dressing        Upper body assist Assist Level: Set up assist    Lower Body Dressing/Undressing Lower body dressing            Lower body assist       Toileting Toileting    Toileting assist Assist for toileting: Minimal Assistance - Patient > 75%     Transfers Chair/bed transfer  Transfers assist           Locomotion Ambulation   Ambulation assist              Walk 10 feet activity   Assist           Walk 50 feet activity   Assist           Walk 150 feet activity   Assist           Walk 10 feet on uneven surface  activity  Assist           Wheelchair     Assist               Wheelchair 50 feet with 2 turns activity    Assist            Wheelchair 150 feet activity     Assist          Blood pressure (!) 151/74, pulse 72, temperature 98.7 F (37.1 C), temperature source Oral, resp. rate 16, weight 82.9 kg, SpO2 92 %.  Medical Problem List and Plan: 1.  Paraspinal myositis, lumbar facet septic arthritis MRSA bacteremia             -patient may shower but incisions must be covered             -ELOS/Goals: 2-3 weeks MinA             -Admit to CIR PT, OT 2.  Antithrombotics: -DVT/anticoagulation:  Pharmaceutical: Lovenox             -antiplatelet therapy: N/a 3. Pain Management: Throbbing/jabbing at knee/thigh --Oxycodone prn not effective             --will add Oxycontin for more consistent relief. Robaxin prn for muscle aches. 4. Mood: LCSW to follow for evaluation and support.             -antipsychotic agents: N/A 5. Neuropsych: This patient is capable of making decisions on her own behalf. 6. Skin/Wound Care: Routine  pressure relief measures.Told by ortho staples may be removed after 10d 7. Fluids/Electrolytes/Nutrition: Monitor I/O. Check CMET in am. 8. Disseminated MRSA infection: Vancomycin w/July 30th as day #1 for 6-8 weeks followed by extended oral course.             --weekly Sed rate, CRP, CMET, CBC             --WBC trending down 24.5-->17.9 9. T2DM: Hgb A1C- 9.8 and poorly controlled.             --Monitor BS ac/hs and use SSI for tighter control.             --Continue Insulin glargine 20 units with 9 units meal coverage.   CBG (last 3)  Recent Labs    04/10/21 1652 04/10/21 2049 04/11/21 0621  GLUCAP 256* 136* 189*  Increase lantus to 25U   10. HTN: Monitor BP TID- continues to be labile --continue hydralazine, Norvasc, Losartan and metoprolol Vitals:   04/10/21 1943 04/11/21 0438  BP: (!) 144/77 (!) 151/74  Pulse: 80 72  Resp: 16 16  Temp: 98.6 F (37 C) 98.7 F (37.1 C)  SpO2: 95% 92%    11. Severe constipation: Resolved             --continue Senna S - 2 tabs bid w/miralax daily 12. Septic Knee s/p debridement: WBAT with KI when up.  13.  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

## 2021-04-11 NOTE — Progress Notes (Signed)
Physical Therapy Session Note  Patient Details  Name: Katie Serrano MRN: [id number] Date of Birth: Apr 05, 1956  Today's Date: 04/11/2021 PT Individual Time: 1435-1540 PT Individual Time Calculation (min): 65 min   Short Term Goals: Week 1:  PT Short Term Goal 1 (Week 1): Pt will perform supine <>sit EOB w/min A for improved bed mobility PT Short Term Goal 2 (Week 1): Pt will initiate gait training w/LRAD for improved functional mobility PT Short Term Goal 3 (Week 1): Pt will perform stand pivot w/LRAD and CGA for improved functional mobility  Skilled Therapeutic Interventions/Progress Updates:  Pt received sitting EOB as handoff w/OT waiting to receive pain meds from nursing, pt reported pain as 5/10 in RLE. Emphasis of session on practice w/stand pivot transfers and improved Wbing tolerance of RLE. Pt's husband, Katie Serrano, present for session. Pt performed stand pivot from EOB <>BSC w/RW and min A. Verbal cues for sequencing w/RW and to keep RLE forward to reduce pressure on R knee. Pt voided continently in [facility] and performed stand pivot from [facility] <>EOB w/RW and min A with extra time. Stand pivot from EOB <>WC and back w/min A for added transfer practice. Sit EOB <>supine w/mod A, provided cue for pt to hook her LLE under her RLE and lift her legs at same time. Pt tried technique but requested husband to help get her legs into bed. Pt was left supine in bed with all needs in reach, husband in room and pain meds received.   Therapy Documentation Precautions:  Precautions Precautions: Fall Required Braces or Orthoses: Knee Immobilizer - Right Knee Immobilizer - Right: Other (comment) Restrictions Weight Bearing Restrictions: Yes RLE Weight Bearing: Weight bearing as tolerated   Therapy/Group: Individual Therapy Katie Serrano, PT, DPT  04/11/2021, 7:33 AM

## 2021-04-11 NOTE — Progress Notes (Signed)
Occupational Therapy Session Note  Patient Details  Name: Katie Serrano MRN: [id number] Date of Birth: [birth year]  Today's Date: 04/11/2021 OT Individual Time: 7673-4193 OT Individual Time Calculation (min): 32 min    Short Term Goals: Week 1:  OT Short Term Goal 1 (Week 1): patient will complete bed mobility with min A OT Short Term Goal 2 (Week 1): patient will complete SPT with RW CGA OT Short Term Goal 3 (Week 1): patient will complete UB adl with set up OT Short Term Goal 4 (Week 1): patient will complete LB adl with min/mod A OT Short Term Goal 5 (Week 1): patient will complete toileting with CGA  Skilled Therapeutic Interventions/Progress Updates:  Treatment session with focus on functional mobility, sit > stand, and pain management.  Pt received supine in bed agreeable to therapy session.  Pt engaged in rolling and bridging to pull pants over hips prior to exiting bed.  Pt required increased time to come to sitting EOB, able to utilize towel to lift RLE to assist with weight shifting and advancement of RLE off EOB.  Pt reports increased pain and no pain meds since early AM.  Therapist educated on pain medicine options that have been ordered for her, notified RN of request for pain medication.  Engaged in sit > stand x4 from elevated EOB with mod fading to min assist with RW.  Pt tolerating standing 30-60 seconds with CGA, however with little to no weight through RLE.  Pt remained seated EOB and passed off to PT.  Therapy Documentation Precautions:  Precautions Precautions: Fall Required Braces or Orthoses: Knee Immobilizer - Right Knee Immobilizer - Right: Other (comment) Restrictions Weight Bearing Restrictions: Yes RLE Weight Bearing: Weight bearing as tolerated  Pain: Pain Assessment Pain Scale: 0-10 Pain Score: 5  Pain Type: Acute pain Pain Location: Knee Pain Orientation: Right Pain Descriptors / Indicators: Aching Pain Intervention(s): RN  notified   Therapy/Group: Individual Therapy  Rosalio Loud 04/11/2021, 3:26 PM

## 2021-04-11 NOTE — Plan of Care (Signed)
  Problem: RH Balance Goal: LTG Patient will maintain dynamic sitting balance (PT) Description: LTG:  Patient will maintain dynamic sitting balance with assistance during mobility activities (PT) Flowsheets (Taken 04/11/2021 1618) LTG: Pt will maintain dynamic sitting balance during mobility activities with:: Independent Goal: LTG Patient will maintain dynamic standing balance (PT) Description: LTG:  Patient will maintain dynamic standing balance with assistance during mobility activities (PT) Flowsheets (Taken 04/11/2021 1618) LTG: Pt will maintain dynamic standing balance during mobility activities with:: (LRAD) Supervision/Verbal cueing   Problem: Sit to Stand Goal: LTG:  Patient will perform sit to stand with assistance level (PT) Description: LTG:  Patient will perform sit to stand with assistance level (PT) Flowsheets (Taken 04/11/2021 1618) LTG: PT will perform sit to stand in preparation for functional mobility with assistance level: (LRAD) Supervision/Verbal cueing   Problem: RH Bed Mobility Goal: LTG Patient will perform bed mobility with assist (PT) Description: LTG: Patient will perform bed mobility with assistance, with/without cues (PT). Flowsheets (Taken 04/11/2021 1618) LTG: Pt will perform bed mobility with assistance level of: Supervision/Verbal cueing   Problem: RH Bed to Chair Transfers Goal: LTG Patient will perform bed/chair transfers w/assist (PT) Description: LTG: Patient will perform bed to chair transfers with assistance (PT). Flowsheets (Taken 04/11/2021 1618) LTG: Pt will perform Bed to Chair Transfers with assistance level: (LRAD) Supervision/Verbal cueing   Problem: RH Car Transfers Goal: LTG Patient will perform car transfers with assist (PT) Description: LTG: Patient will perform car transfers with assistance (PT). Flowsheets (Taken 04/11/2021 1618) LTG: Pt will perform car transfers with assist:: (LRAD) Supervision/Verbal cueing   Problem: RH Ambulation Goal:  LTG Patient will ambulate in controlled environment (PT) Description: LTG: Patient will ambulate in a controlled environment, # of feet with assistance (PT). Flowsheets (Taken 04/11/2021 1618) LTG: Pt will ambulate in controlled environ  assist needed:: (LRAD) Minimal Assistance - Patient > 75% LTG: Ambulation distance in controlled environment: 100' Goal: LTG Patient will ambulate in home environment (PT) Description: LTG: Patient will ambulate in home environment, # of feet with assistance (PT). Flowsheets (Taken 04/11/2021 1618) LTG: Pt will ambulate in home environ  assist needed:: (LRAD) Supervision/Verbal cueing LTG: Ambulation distance in home environment: 50'   Problem: RH Wheelchair Mobility Goal: LTG Patient will propel w/c in controlled environment (PT) Description: LTG: Patient will propel wheelchair in controlled environment, # of feet with assist (PT) Flowsheets (Taken 04/11/2021 1618) LTG: Pt will propel w/c in controlled environ  assist needed:: Set up assist LTG: Propel w/c distance in controlled environment: 150' Goal: LTG Patient will propel w/c in home environment (PT) Description: LTG: Patient will propel wheelchair in home environment, # of feet with assistance (PT). Flowsheets (Taken 04/11/2021 1618) LTG: Pt will propel w/c in home environ  assist needed:: Set up assist Distance: wheelchair distance in controlled environment: 50 LTG: Propel w/c distance in home environment: 50'   Problem: RH Stairs Goal: LTG Patient will ambulate up and down stairs w/assist (PT) Description: LTG: Patient will ambulate up and down # of stairs with assistance (PT) Flowsheets (Taken 04/11/2021 1618) LTG: Pt will ambulate up/down stairs assist needed:: (2 rails) Minimal Assistance - Patient > 75% LTG: Pt will  ambulate up and down number of stairs: 4

## 2021-04-11 NOTE — Progress Notes (Signed)
Pharmacy Antibiotic Note  Katie Serrano is a 65 y.o. 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  Pharmacy has been consulted for vancomycin dosing.  Vancomycin peak (23) today was drawn about an hour and a half late. Vancomycin trough is 18, drawn appropriately about 8 hours after the last dose. AUC predicted to be around 535. Last SCr on 8/5 was within normal limits at 0.69. AUC is therapeutic and trough at goal range. Patient will be discharged with OPAT and changing to q12h dosing will be more convenient.   Plan: Change to Vancomycin 1000 mg IV every 12 hours for eAUC of 475 and estimated trough of 14.  Monitor renal function Repeat levels at least weekly   Weight: 82.9 kg (182 lb 12.2 oz)  Temp (24hrs), Avg:98.5 F (36.9 C), Min:98.2 F (36.8 C), Max:98.7 F (37.1 C)  Recent Labs  Lab 04/05/21 0322 04/05/21 1802 04/05/21 1802 04/05/21 2159 04/07/21 0858 04/07/21 1441 04/08/21 0430 04/10/21 0400 04/11/21 0223 04/11/21 0636  WBC 19.7* 23.4*  --   --   --   --  18.3* 17.9* 16.6*  --   CREATININE 0.82 0.85  --   --   --   --  0.72 0.72 0.69  --   VANCOTROUGH  --   --   --   --   --  16  --   --   --  18  VANCOPEAK  --  20*   < >  --  37  --   --   --  23*  --   VANCORANDOM  --   --   --  15  --   --   --   --   --   --    < > = values in this interval not displayed.     Estimated Creatinine Clearance: 76 mL/min (by C-G formula based on SCr of 0.69 mg/dL).    No Known Allergies   Georgeann Oppenheim, PharmD Pharmacy Resident 04/11/2021, 9:26 AM

## 2021-04-11 NOTE — Progress Notes (Signed)
[facility] Individual Statement of Services  Patient Name:  Katie Serrano  Date:  04/11/2021  Welcome to the [facility].  Our goal is to provide you with an individualized program based on your diagnosis and situation, designed to meet your specific needs.  With this comprehensive rehabilitation program, you will be expected to participate in at least 3 hours of rehabilitation therapies Monday-Friday, with modified therapy programming on the weekends.  Your rehabilitation program will include the following services:  Physical Therapy (PT), Occupational Therapy (OT), 24 hour per day rehabilitation nursing, Care Coordinator, Rehabilitation Medicine, Nutrition Services, and Pharmacy Services  Weekly team conferences will be held on Tuesday to discuss your progress.  Your Inpatient Rehabilitation Care Coordinator will talk with you frequently to get your input and to update you on team discussions.  Team conferences with you and your family in attendance may also be held.  Expected length of stay: 2 weeks  Overall anticipated outcome: CGA level  Depending on your progress and recovery, your program may change. Your Inpatient Rehabilitation Care Coordinator will coordinate services and will keep you informed of any changes. Your Inpatient Rehabilitation Care Coordinator's name and contact numbers are listed  below.  The following services may also be recommended but are not provided by the Inpatient [facility]:  Driving Evaluations Home Health Rehabiltiation Services Outpatient Rehabilitation Services    Arrangements will be made to provide these services after discharge if needed.  Arrangements include referral to agencies that provide these services.  Your insurance has been verified to be:  medicare & [employer] Your primary doctor is:  Feliciana Rossetti  Pertinent information will be shared with your doctor and your insurance  company.  Inpatient Rehabilitation Care Coordinator:  Dossie Der, [place] [phone] or Luna Glasgow  Information discussed with and copy given to patient by: Lucy Chris, 04/11/2021, 12:35 PM

## 2021-04-11 NOTE — Progress Notes (Signed)
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 Relevant Hx: Course: Daily Update: Today's Plan:discussed with her that her sugars will increase and she has actively worked on her diet and diet and lost from 242 to 188 which I commend her on, she is to continue with her weight loss efforts despite taking this   Electronically signed by: Sandi Mealy   Past Surgical History:  Past Surgical History:  Procedure Laterality Date   APPENDECTOMY     BUBBLE STUDY  04/04/2021   Procedure: BUBBLE STUDY;  Surgeon: Sande Rives, MD;  Location: [facility] ENDOSCOPY;  Service: Cardiovascular;;   BUNIONECTOMY Right    I & D EXTREMITY Right 04/05/2021   Procedure: IRRIGATION AND DEBRIDEMENT KNEE;  Surgeon: Samson Frederic, MD;  Location: [facility] OR;  Service: Orthopedics;  Laterality: Right;   LEFT HEART CATH AND CORONARY ANGIOGRAPHY N/A 03/12/2021   Procedure: LEFT HEART CATH AND CORONARY ANGIOGRAPHY;  Surgeon: Lennette Bihari, MD;   Location: [facility] INVASIVE CV LAB;  Service: Cardiovascular;  Laterality: N/A;   NECK SURGERY     SMALL INTESTINE SURGERY     TEE WITHOUT CARDIOVERSION N/A 04/04/2021   Procedure: TRANSESOPHAGEAL ECHOCARDITRRROGRAM (TEE);  Surgeon: Sande Rives, MD;  Location: [facility] ENDOSCOPY;  Service: Cardiovascular;  Laterality: N/A;   TONSILLECTOMY     Social History:  reports that she has been smoking cigarettes. She has been smoking an average of .5 packs per day. She has never used smokeless tobacco. She reports current alcohol use of about 1.0 standard drink of alcohol per week. She reports that she does not use drugs.  Family / Support Systems Marital Status: Married Patient Roles: Spouse, Parent, Caregiver (watches grandchildren-6, 8 11) Spouse/Significant Other: Casimiro Needle [phone]-cell Children: Tara-daughter [phone]-cell Son local and another son in [place] Other Supports: Friends and neighbors Anticipated Caregiver: Casimiro Needle Ability/Limitations of Caregiver: helathy and can assist Caregiver Availability: 24/7 Family Dynamics: Close knit with three children-two of which live locally and they watch their grandchildren 6, 8, 35.  They have friends and neighbors who are supportive also  Social History Preferred language: English Religion: Non-Denominational Cultural Background: No issues Education: HS Read: Yes Write: Yes Employment Status: Retired Marine scientist Issues: No issues Guardian/Conservator: none-according to MD pt is capable of making her own decisions while here. Husband plans to be here daily yo provide support   Abuse/Neglect Abuse/Neglect Assessment Can Be Completed: Yes Physical Abuse: Denies Verbal Abuse: Denies Sexual Abuse: Denies Exploitation of patient/patient's resources: Denies Self-Neglect: Denies  Emotional Status Pt's affect, behavior and adjustment status: Pt is motivated to  do well, she was fine one day and then the next was here. She has always  been independent and taken care of herself and others. She is hopeful she will do well here and recover and regain her independence Recent Psychosocial Issues: was healthy prior to admission-other health issues managed Psychiatric History: No history deferred depression screen due to adjusting and is coping appropriately with being in the hospital and her illness Substance Abuse History: Still smokes but aware of the health risks and may quti now. Aware of the resources available to her for this  Patient / Family Perceptions, Expectations & Goals Pt/Family understanding of illness & functional limitations: Pt and husband can explain her illness and the suddeness and are hopeful she will do well. Both talk with the MD and feel they have a good understanding of her plan going forward. 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."  [employer]: None Premorbid Home Care/DME Agencies: None Transportation available at discharge: Husband drives so did pt prior to hospitalization  Discharge Planning Living Arrangements: Spouse/significant other Support Systems: Spouse/significant other, Children, Other relatives, Friends/neighbors, Psychologist, clinical community Type of Residence: Private residence Insurance Resources: [employer], Media planner (specify) (mutual of [employer]) Surveyor, quantity Resources: Tree surgeon, Family Support Financial Screen Referred: No Living Expenses: Own Money Management: Patient, Spouse Does the patient have any problems obtaining your medications?: No Home Management: Both Patient/Family Preliminary Plans: Return home with  husband who is able to assist if needed, pt is hopeful he will not need too. Aware being evaluated today and goals set, will need IV antibiotics at discharge due to needs 6 weeks, Care Coordinator Anticipated Follow Up Needs: HH/OP  Clinical Impression Pleasant motivated female how wants to get back to her independent level and back to her grandchildren, whom she watches while son and daughter in-law work. Husband is retired and very supportive and will assist if needed. Will await team evaluations and work on discharge needs. Will need IV antibiotics at discharge.  Lucy Chris 04/11/2021, 10:25 AM

## 2021-04-11 NOTE — Evaluation (Signed)
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   Elevated C-reactive protein (CRP) 01/15/2021   Encounter for long-term current use of high risk medication 01/29/2016   Essential hypertension 01/29/2016   Gastroesophageal reflux disease 01/29/2016   Intermittent asthma without complication 09/08/4578   Mixed hyperlipidemia 05/01/2019   Osteoarthritis of multiple joints 01/29/2016   Shortness of breath 01/13/2021   Tobacco use disorder 12/02/2017   Type 2 diabetes mellitus without complication, without long-term current use of insulin ([place]) 01/30/2016   Last Assessment & Plan:  Formatting of this note might be different from the original. Relevant Hx: Course: Daily Update: Today's Plan:discussed with her that her sugars will increase and she has actively worked on her diet and diet and lost from 242 to 188 which I commend her on, she is to continue with her weight loss efforts despite taking this   Electronically signed by: Danie Chandler   Past Surgical History:  Past Surgical History:  Procedure Laterality Date   APPENDECTOMY     BUBBLE STUDY  04/04/2021   Procedure: BUBBLE STUDY;  Surgeon: Geralynn Rile, MD;  Location: [place];  Service: Cardiovascular;;   BUNIONECTOMY Right    I & D EXTREMITY Right 04/05/2021   Procedure: IRRIGATION AND DEBRIDEMENT KNEE;  Surgeon: Rod Can, MD;  Location: [place];  Service: Orthopedics;  Laterality: Right;    LEFT HEART CATH AND CORONARY ANGIOGRAPHY N/A 03/12/2021   Procedure: LEFT HEART CATH AND CORONARY ANGIOGRAPHY;  Surgeon: Troy Sine, MD;  Location: [place] [facility];  Service: Cardiovascular;  Laterality: N/A;   NECK SURGERY     SMALL INTESTINE SURGERY     TEE WITHOUT CARDIOVERSION N/A 04/04/2021   Procedure: TRANSESOPHAGEAL ECHOCARDITRRROGRAM (TEE);  Surgeon: Geralynn Rile, MD;  Location: [place];  Service: Cardiovascular;  Laterality: N/A;   TONSILLECTOMY      Assessment & Plan Clinical Impression: Patient is a 65 y.o. year old female with history of HTN, COPD/Asthma, T2DM, CAD, OA who was admitted on 03/30/21 with weakness, fevers and left flank pain due to  sepsis from MRSA UTI with bacteremia. CT abdomen pelvis showed bilateral pyelonephritis and was negative for hydronephrosis. ID consulted for input and expressed concerns of epidural abscess with impingement causing BLE weakness.  MRI spine done showing 2 cystic collections lesions about left L4-L5 facet favored to be acute facet arthritis possible septic arthritis as well as secondary mass effect on L5 nerve root, abnormal edema with enhancement of left greater than right paraspinous muscles suggestive of acute myositis that could be infectious or inflammatory in nature versus injury or strain.  Dr. Reatha Armour was consulted for input and felt that no significant neural compression seen or drainable abscess.  Medical management and treatment with antibiotics recommended and neurosurgery signed off.  TEE showed EF of 55 to 60% with mild grade 2.  Plaque involving descending aorta, no aortic renal thrombus and was negative for endocarditis.   Patient  also complained of throbbing headaches of her right eye on 07/29 and neurology was consulted for input. MRI brain done showing few scattered subcentimeter acute ischemic nonhemorrhagic infarcts involving left caudate head, left parietal lobe, left splenium and right temporal lobe with  question of diffuse leptomeningeal enhancement involving both cerebral hemispheres with question of meningitis.  CTA head/neck done showing no large vessel occlusion or significant stenosis.  Dr. Cheral Marker was consulted for input on embolic stroke and did did not recommend LP as patient had already been on antibiotics, without nuchal rigidity and would not change course of treatment.   He questioned A. fib as cause of him embolic  phenomena and recommended treatment with lengthy antibiotic course due to concerns of septic emboli to brain.      She developed right knee swelling with pain due to septic arthritis due to MRSA positive cultures. She was taken to OR for I & D of knee by Dr. Lyla Glassing on 07/30. Dr. Baxter Flattery recommends 8 week course  Vanc for possible L4/5 discitis with early osteo, CNS septic embolic and septic arthritis.  Electrolyte abnormalities resolving with downward trend in WBC. She continues to be limited by knee pain with weakness, generalized malaise and noted to have decreased activity tolerance. Therapy ongoing and working on standing attempts.   Patient transferred to CIR on 04/10/2021 .    Patient currently requires mod with basic self-care skills secondary to muscle weakness, decreased cardiorespiratoy endurance, and decreased standing balance and decreased postural control.  Prior to hospitalization, patient could complete ADL with independent .  Patient will benefit from skilled intervention to decrease level of assist with basic self-care skills, increase independence with basic self-care skills, and increase level of independence with iADL prior to discharge home with care partner.  Anticipate patient will require minimal physical assistance and follow up home health.  OT - End of Session Activity Tolerance: Tolerates 10 - 20 min activity with multiple rests Endurance Deficit: Yes Endurance Deficit Description: fatigue with basic adl tasks, needs encouragement to change position OT  Assessment OT Patient demonstrates impairments in the following area(s): Balance;Endurance;Pain OT Basic ADL's Functional Problem(s): Grooming;Bathing;Dressing;Toileting OT Advanced ADL's Functional Problem(s): Simple Meal Preparation OT Transfers Functional Problem(s): Toilet OT Plan OT Intensity: Minimum of 1-2 x/day, 45 to 90 minutes OT Frequency: 5 out of 7 days OT Duration/Estimated Length of Stay: 2 weeks OT Treatment/Interventions: Balance/vestibular training;Discharge planning;DME/adaptive equipment instruction;Functional mobility training;Pain management;Patient/family education;Self Care/advanced ADL retraining;Therapeutic Activities;Therapeutic Exercise OT Self Feeding Anticipated Outcome(s): independent OT Basic Self-Care Anticipated Outcome(s): set up / min A OT Toileting Anticipated Outcome(s): CS OT Bathroom Transfers Anticipated Outcome(s): CS OT Recommendation Patient destination: Home Follow Up Recommendations: Home health OT Equipment Recommended: 3 in 1 bedside comode   OT Evaluation Precautions/Restrictions  Precautions Precautions: Fall Required Braces or Orthoses: Knee Immobilizer - Right Knee Immobilizer - Right: Other (comment) Restrictions Weight Bearing Restrictions: Yes RLE Weight Bearing: Weight bearing as tolerated General Family/Caregiver Present: Yes Vital Signs  Pain Pain Assessment Pain Scale: 0-10 Pain Score: 4  Pain Type: Acute pain Pain Location: Knee Pain Orientation: Right Pain Descriptors / Indicators: Aching Pain Intervention(s): Repositioned Home Living/Prior Functioning Home Living Living Arrangements: Spouse/significant other Available Help at Discharge: Family, Friend(s), Available 24 hours/day Type of Home: House Home Access: Stairs to enter Technical brewer of Steps: 3 Entrance Stairs-Rails: Right, Left, Can reach both Home Layout: One level Bathroom Shower/Tub: Tub/shower unit, Multimedia programmer:  Standard Bathroom Accessibility: Yes Additional Comments: Pt husband present and  supportive throughout session  Lives With: Spouse Prior Function Level of Independence: Independent with basic ADLs, Independent with transfers, Independent with gait, Independent with homemaking with ambulation  Able to Take Stairs?: Yes Driving: Yes Vocation: Retired Leisure: Hobbies-yes (Comment) (Crochet, paint, gardens, plays with grandchildren daily) Comments: Pt watches grandchildren and great grand children daily, also likes to garden Vision Baseline Vision/History: Wears glasses Patient Visual Report: Other (comment) (patient notes some eye irritation right eye - no change to acuity) Vision Assessment?: Yes Eye Alignment: Within Functional Limits Ocular Range of Motion: Within Functional Limits Alignment/Gaze Preference: Within Defined Limits Tracking/Visual Pursuits: Able to track stimulus in all quads without difficulty Perception  Perception: Within Functional Limits Praxis Praxis: Intact Cognition Overall Cognitive Status: Within Functional Limits for tasks assessed Arousal/Alertness: Awake/alert Orientation Level: Person;Place;Situation Person: Oriented Place: Oriented Situation: Oriented Year: 2022 Month: August Day of Week: Correct Memory: Appears intact Immediate Memory Recall: Sock;Blue;Bed Memory Recall Sock: Without Cue Memory Recall Blue: Without Cue Memory Recall Bed: Without Cue Awareness: Appears intact Problem Solving: Appears intact Safety/Judgment: Appears intact Sensation Sensation Light Touch: Appears Intact Hot/Cold: Appears Intact Coordination Gross Motor Movements are Fluid and Coordinated: No Fine Motor Movements are Fluid and Coordinated: Yes Coordination and Movement Description: Very slow, limited by RLE pain Finger Nose Finger Test: Mild dysmetria bilaterally Heel Shin Test: Unable to assess 2/2 pain Motor  Motor Motor: Other (comment) Motor -  Skilled Clinical Observations: Significant RLE weakness 2/2 pain and global deconditioning, fear-avoidance behavior  Trunk/Postural Assessment  Cervical Assessment Cervical Assessment: Exceptions to [facility] (Forward head) Thoracic Assessment Thoracic Assessment: Exceptions to [facility] (Rounded shoulders) Lumbar Assessment Lumbar Assessment: Exceptions to [facility] (Posterior pelvic tilt) Postural Control Postural Control: Deficits on evaluation (Simultaneous filing. User may not have seen previous data.) Trunk Control: Leans to L, requires BUE support Protective Responses: Impaired 2/2 RLE pain and fear-avoidance behavior  Balance Balance Balance Assessed: Yes Static Sitting Balance Static Sitting - Balance Support: Feet supported;Bilateral upper extremity supported Static Sitting - Level of Assistance: 5: Stand by assistance Dynamic Sitting Balance Dynamic Sitting - Balance Support: Feet supported Dynamic Sitting - Level of Assistance: 5: Stand by assistance Sitting balance - Comments: able to balance sitting at edge of bed with support to right LE Static Standing Balance Static Standing - Level of Assistance: 5: Stand by assistance Dynamic Standing Balance Dynamic Standing - Level of Assistance: 4: Min assist Extremity/Trunk Assessment RUE Assessment RUE Assessment: Within Functional Limits LUE Assessment LUE Assessment: Within Functional Limits  Care Tool Care Tool Self Care Eating   Eating Assist Level: Set up assist    Oral Care    Oral Care Assist Level: Set up assist    Bathing   Body parts bathed by patient: Right arm;Left arm;Chest;Abdomen;Front perineal area;Right upper leg;Left upper leg;Face Body parts bathed by helper: Buttocks;Right lower leg;Left lower leg   Assist Level: Maximal Assistance - Patient 24 - 49%    Upper Body Dressing(including orthotics)   What is the patient wearing?: Pull over shirt   Assist Level: Minimal Assistance - Patient > 75%    Lower Body  Dressing (excluding footwear)   What is the patient wearing?: Pants Assist for lower body dressing: Total Assistance - Patient < 25%    Putting on/Taking off footwear   What is the patient wearing?: Non-skid slipper socks Assist for footwear: Total Assistance - Patient < 25%       Care Tool Toileting Toileting activity   Assist for toileting: Maximal Assistance -  Patient 25 - 49%     Care Tool Bed Mobility Roll left and right activity        Sit to lying activity        Lying to sitting edge of bed activity         Care Tool Transfers Sit to stand transfer        Chair/bed transfer         Toilet transfer   Assist Level: Moderate Assistance - Patient 50 - 74%     Care Tool Cognition Expression of Ideas and Wants Expression of Ideas and Wants: Without difficulty (complex and basic) - expresses complex messages without difficulty and with speech that is clear and easy to understand   Understanding Verbal and Non-Verbal Content Understanding Verbal and Non-Verbal Content: Understands (complex and basic) - clear comprehension without cues or repetitions   Memory/Recall Ability *first 3 days only Memory/Recall Ability *first 3 days only: Current season;That he or she is in a hospital/hospital unit    Refer to Care Plan for [place] 1 OT Short Term Goal 1 (Week 1): patient will complete bed mobility with min A OT Short Term Goal 2 (Week 1): patient will complete SPT with RW CGA OT Short Term Goal 3 (Week 1): patient will complete UB adl with set up OT Short Term Goal 4 (Week 1): patient will complete LB adl with min/mod A OT Short Term Goal 5 (Week 1): patient will complete toileting with CGA  Recommendations for other services: None    Skilled Therapeutic Intervention  Patient seated edge of bed with PT.  She states that she needs to void but reluctant to use bed side commode.  With encouragement able to complete sit to stand edge of  bed with min A and SPT bed to commode with min/mod A for walker management, posture and cues for safety.  Voided on commode, max A to complete hygiene.   SPT commode to bed with min/mod A.  Patient returned to supine position with mod A for trunk and right leg management.  Husband entered and present for remainder of session.   Reviewed role of OT and completed evaluation as documented above.  Reviewed goals for therapy and potential DME/assistive device options.  Patient presents with general deconditioning, pain and anxiety limiting all aspects of self care, mobility and home management.  She notes that she feels more confident after voiding on the commode and did not experience as much pain as she expected.   Completed bed level adl - bathing and dressing as documented below.  She is an excellent rehab candidate to maximize strength and mobility and facilitate a safe return to home with husband.  Bed alarm set and call bell in reach at close of session.      ADL ADL Eating: Set up Where Assessed-Eating: Bed level Grooming: Setup Where Assessed-Grooming: Bed level Upper Body Bathing: Setup Where Assessed-Upper Body Bathing: Bed level Lower Body Bathing: Maximal assistance Where Assessed-Lower Body Bathing: Bed level;Chair Upper Body Dressing: Minimal assistance Where Assessed-Upper Body Dressing: Edge of bed Lower Body Dressing: Maximal assistance Where Assessed-Lower Body Dressing: Bed level Toileting: Maximal assistance Where Assessed-Toileting: Bedside Commode Toilet Transfer: Moderate assistance Toilet Transfer Method: Stand pivot Toilet Transfer Equipment: Bedside commode Mobility  Bed Mobility Bed Mobility: Sit to Supine Rolling Right: Contact Guard/Touching assist Rolling Left: Contact Guard/Touching assist Supine to Sit: Moderate Assistance - Patient 50-74% (RLE management) Sitting - Scoot to [employer]  of Bed: Minimal Assistance - Patient > 75% Sit to Supine: Moderate Assistance  - Patient 50-74% Transfers Sit to Stand: Minimal Assistance - Patient > 75% Stand to Sit: Contact Guard/Touching assist   Discharge Criteria: Patient will be discharged from OT if patient refuses treatment 3 consecutive times without medical reason, if treatment goals not met, if there is a change in medical status, if patient makes no progress towards goals or if patient is discharged from hospital.  The above assessment, treatment plan, treatment alternatives and goals were discussed and mutually agreed upon: by patient and by family  Carlos Levering 04/11/2021, 12:29 PM

## 2021-04-12 DIAGNOSIS — M6009 Infective myositis, multiple sites: Secondary | ICD-10-CM

## 2021-04-12 DIAGNOSIS — I10 Essential (primary) hypertension: Secondary | ICD-10-CM

## 2021-04-12 DIAGNOSIS — E119 Type 2 diabetes mellitus without complications: Secondary | ICD-10-CM

## 2021-04-12 DIAGNOSIS — K5901 Slow transit constipation: Secondary | ICD-10-CM

## 2021-04-12 LAB — GLUCOSE, CAPILLARY
Glucose-Capillary: 138 mg/dL — ABNORMAL HIGH (ref 70–99)
Glucose-Capillary: 121 mg/dL — ABNORMAL HIGH (ref 70–99)
Glucose-Capillary: 170 mg/dL — ABNORMAL HIGH (ref 70–99)
Glucose-Capillary: 162 mg/dL — ABNORMAL HIGH (ref 70–99)

## 2021-04-12 MED ORDER — POLYETHYLENE GLYCOL 3350 17 G PO PACK
17.0000 g | PACK | Freq: Two times a day (BID) | ORAL | Status: DC
  Administered 2021-04-12 – 2021-04-17 (×10): 17 g via ORAL
  Filled 2021-04-12 (×12): qty 1

## 2021-04-12 MED ORDER — CHLORHEXIDINE GLUCONATE CLOTH 2 % EX PADS
6.0000 | MEDICATED_PAD | Freq: Every day | CUTANEOUS | Status: DC
  Administered 2021-04-12 – 2021-04-17 (×6): 6 via TOPICAL

## 2021-04-12 MED ORDER — METHOCARBAMOL 500 MG PO TABS
500.0000 mg | ORAL_TABLET | Freq: Four times a day (QID) | ORAL | Status: DC
  Administered 2021-04-12 – 2021-04-18 (×24): 500 mg via ORAL
  Filled 2021-04-12 (×25): qty 1

## 2021-04-12 MED ORDER — OXYCODONE HCL ER 15 MG PO T12A
15.0000 mg | EXTENDED_RELEASE_TABLET | Freq: Two times a day (BID) | ORAL | Status: DC
  Administered 2021-04-12 – 2021-04-13 (×3): 15 mg via ORAL
  Filled 2021-04-12 (×3): qty 1

## 2021-04-12 NOTE — Plan of Care (Signed)
  Problem: Consults Goal: RH STROKE PATIENT EDUCATION Description: See Patient Education module for education specifics  Outcome: Progressing   Problem: RH BOWEL ELIMINATION Goal: RH STG MANAGE BOWEL WITH ASSISTANCE Description: STG Manage Bowel with mod I  Assistance. Outcome: Progressing Goal: RH STG MANAGE BOWEL W/MEDICATION W/ASSISTANCE Description: STG Manage Bowel with Medication with mod I Assistance. Outcome: Progressing   Problem: RH BLADDER ELIMINATION Goal: RH STG MANAGE BLADDER WITH ASSISTANCE Description: STG Manage Bladder With  independently ,no Assistance Outcome: Progressing   Problem: RH SKIN INTEGRITY Goal: RH STG ABLE TO PERFORM INCISION/WOUND CARE W/ASSISTANCE Description: STG Able To Perform Incision/Wound Care With min  Assistance. Outcome: Progressing   Problem: RH SAFETY Goal: RH STG ADHERE TO SAFETY PRECAUTIONS W/ASSISTANCE/DEVICE Description: STG Adhere to Safety Precautions With cues/reminders Assistance/Device. Outcome: Progressing   Problem: RH PAIN MANAGEMENT Goal: RH STG PAIN MANAGED AT OR BELOW PT'S PAIN GOAL Description: At or below level 4 Outcome: Progressing   Problem: RH KNOWLEDGE DEFICIT Goal: RH STG INCREASE KNOWLEDGE OF DIABETES Description: Patient will be able to manage DM with medications and dietary modifications using handouts and educational resources independently Outcome: Progressing Goal: RH STG INCREASE KNOWLEDGE OF HYPERTENSION Description: Patient will be able to manage HTN with medications and dietary modifications using handouts and educational resources independently Outcome: Progressing Goal: RH STG INCREASE KNOWLEGDE OF HYPERLIPIDEMIA Description: Patient will be able to manage HLD with medications and dietary modifications using handouts and educational resources independently Outcome: Progressing Goal: RH STG INCREASE KNOWLEDGE OF STROKE PROPHYLAXIS Description: Patient will be able to manage secondary stroke  risks with medications and dietary modifications using handouts and educational resources independently Outcome: Progressing   Problem: Urinary Elimination: Goal: Signs and symptoms of infection will decrease Description: Patient will be able to manage infection prevention with medications and decrease risk of future infections using handouts and educational resources independently Outcome: Progressing

## 2021-04-12 NOTE — Progress Notes (Addendum)
PROGRESS NOTE   Subjective/Complaints: C/o back, right knee pain, constipation.   ROS: Patient denies fever, rash, sore throat, blurred vision, nausea, vomiting, diarrhea, cough, shortness of breath or chest pain,   headache, or mood change.    Objective:   No results found. Recent Labs    04/10/21 0400 04/11/21 0223  WBC 17.9* 16.6*  HGB 11.3* 10.8*  HCT 34.7* 33.1*  PLT 543* 535*   Recent Labs    04/10/21 0400 04/11/21 0223  NA 135 137  K 3.6 3.9  CL 98 102  CO2 28 29  GLUCOSE 147* 184*  BUN 6* 6*  CREATININE 0.72 0.69  CALCIUM 8.4* 8.5*    Intake/Output Summary (Last 24 hours) at 04/12/2021 1226 Last data filed at 04/12/2021 1014 Gross per 24 hour  Intake 368 ml  Output --  Net 368 ml        Physical Exam: Vital Signs Blood pressure 116/66, pulse 62, temperature 98.4 F (36.9 C), temperature source Oral, resp. rate 18, weight 82.9 kg, SpO2 97 %.  Constitutional: No distress . Vital signs reviewed. HEENT: NCAT, EOMI, oral membranes moist Neck: supple Cardiovascular: RRR without murmur. No JVD    Respiratory/Chest: CTA Bilaterally without wheezes or rales. Normal effort    GI/Abdomen: BS +, non-tender, non-distended Ext: no clubbing, cyanosis, or edema Psych: pleasant and cooperative  Skin: No evidence of breakdown, no evidence of rash. Right knee incision CDI with staples Neurologic: Cranial nerves II through XII intact, motor strength is 5/5 in bilateral deltoid, bicep, tricep, grip, hip flexor, 2- Righ tand 4/5 Left knee extensors, 4/5 B ankle dorsiflexor and plantar flexor Sensory exam normal sensation to light touch and proprioception in bilateral upper and lower extremities Cerebellar exam normal finger to nose to finger as well as heel to shin in bilateral upper and lower extremities Musculoskeletal: reduced ROM right knee with flexion, low back TTP and with movement    Assessment/Plan: 1.  Functional deficits which require 3+ hours per day of interdisciplinary therapy in a comprehensive inpatient rehab setting. Physiatrist is providing close team supervision and 24 hour management of active medical problems listed below. Physiatrist and rehab team continue to assess barriers to discharge/monitor patient progress toward functional and medical goals  Care Tool:  Bathing    Body parts bathed by patient: Right arm, Left arm, Chest, Abdomen, Front perineal area, Right upper leg, Left upper leg, Face   Body parts bathed by helper: Buttocks, Right lower leg, Left lower leg     Bathing assist Assist Level: Moderate Assistance - Patient 50 - 74%     Upper Body Dressing/Undressing Upper body dressing   What is the patient wearing?: Pull over shirt    Upper body assist Assist Level: Minimal Assistance - Patient > 75%    Lower Body Dressing/Undressing Lower body dressing      What is the patient wearing?: Pants     Lower body assist Assist for lower body dressing: Total Assistance - Patient < 25%     Toileting Toileting    Toileting assist Assist for toileting: Moderate Assistance - Patient 50 - 74%     Transfers Chair/bed transfer  Transfers  assist  Chair/bed transfer activity did not occur: Safety/medical concerns (Pain, fatigue)        Locomotion Ambulation   Ambulation assist   Ambulation activity did not occur: Safety/medical concerns (Pain, fatigue)          Walk 10 feet activity   Assist  Walk 10 feet activity did not occur: Safety/medical concerns (Pain, fatigue)        Walk 50 feet activity   Assist Walk 50 feet with 2 turns activity did not occur: Safety/medical concerns (Pain, fatigue)         Walk 150 feet activity   Assist Walk 150 feet activity did not occur: Safety/medical concerns (Pain, fatigue)         Walk 10 feet on uneven surface  activity   Assist Walk 10 feet on uneven surfaces activity did not occur:  Safety/medical concerns (Pain, fatigue)         Wheelchair     Assist Will patient use wheelchair at discharge?: Yes Type of Wheelchair: Manual Wheelchair activity did not occur: Safety/medical concerns (Pain, fatigue)         Wheelchair 50 feet with 2 turns activity    Assist    Wheelchair 50 feet with 2 turns activity did not occur: Safety/medical concerns (Pain, fatigue)       Wheelchair 150 feet activity     Assist  Wheelchair 150 feet activity did not occur: Safety/medical concerns (Pain, fatigue)       Blood pressure 116/66, pulse 62, temperature 98.4 F (36.9 C), temperature source Oral, resp. rate 18, weight 82.9 kg, SpO2 97 %.  Medical Problem List and Plan: 1.  Paraspinal myositis, lumbar facet septic arthritis MRSA bacteremia             -patient may shower but incisions must be covered             -ELOS/Goals: 2-3 weeks MinA             -Continue CIR therapies including PT, OT  2.  Antithrombotics: -DVT/anticoagulation:  Pharmaceutical: Lovenox             -antiplatelet therapy: N/a 3. Pain Management: Throbbing/jabbing at knee/thigh --Oxycodone prn not effective             --added Oxycontin 8/4 for more consistent relief. -increase to 15 mg q12 given ongoing pain - Robaxin--> schedule qid  for muscle aches. 4. Mood: LCSW to follow for evaluation and support.             -antipsychotic agents: N/A 5. Neuropsych: This patient is capable of making decisions on her own behalf. 6. Skin/Wound Care: Routine pressure relief measures.Told by ortho staples may be removed after 10d 7. Fluids/Electrolytes/Nutrition: Monitor I/O. Check CMET in am. 8. Disseminated MRSA infection: Vancomycin w/July 30th as day #1 for 6-8 weeks followed by extended oral course.             --weekly Sed rate, CRP, CMET, CBC             --WBC trending down 24.5-->17.9 9. T2DM: Hgb A1C- 9.8 and poorly controlled.             --Monitor BS ac/hs and use SSI for tighter  control.             --Continue Insulin glargine 20 units with 9 units meal coverage.   CBG (last 3)  Recent Labs    04/11/21 2112 04/12/21 0622 04/12/21 1130  GLUCAP 162* 138*  121*  Increased lantus to 25U yesterday with some improvement  10. HTN: Monitor BP TID- continues to be labile --continue hydralazine, Norvasc, Losartan and metoprolol Vitals:   04/11/21 1946 04/12/21 0442  BP: 132/66 116/66  Pulse: 67 62  Resp: 18 18  Temp: 98.2 F (36.8 C) 98.4 F (36.9 C)  SpO2: 97% 97%    11. Severe constipation: last bm 8/2             --continue Senna S - 2 tabs bid -increase miralax to bid as well. 12. Septic Knee s/p debridement: WBAT with KI when up.  13.  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

## 2021-04-12 NOTE — Progress Notes (Signed)
Occupational Therapy Session Note  Patient Details  Name: Katie Serrano MRN: [id number] Date of Birth: 08/29/1956  Today's Date: 04/12/2021 OT Individual Time: 0900-1000   &   1400-1443 OT Individual Time Calculation (min): 60 min    &   43 min   Short Term Goals: Week 1:  OT Short Term Goal 1 (Week 1): patient will complete bed mobility with min A OT Short Term Goal 2 (Week 1): patient will complete SPT with RW CGA OT Short Term Goal 3 (Week 1): patient will complete UB adl with set up OT Short Term Goal 4 (Week 1): patient will complete LB adl with min/mod A OT Short Term Goal 5 (Week 1): patient will complete toileting with CGA  Skilled Therapeutic Interventions/Progress Updates:    AM session:   Patient in bed, alert and ready for therapy session.  She notes that she is feeling encouraged after therapy yesterday.  Able to donn pants at bed level with min a.  Supine to sitting edge of bed with CGA and increased time - using towel to support right LE.  Sit to stand and SPT with RW to/from bed, w/c, commode with CG/min A.  Oral care and grooming at sink with set up, UB bathing/dressing with set up.  Assisted with washing hair using shampoo tray.  Toileting completed at bed side commode with CGA.  SPT back to bed with CG A.  Sit to supine min A for right leg management.  She remained in bed at close of session, call bell and tray table in reach.     PM session:   Patient seated in commode, has finished continent void (urine only).  Able to stand from commode with CGA, complete hygiene and pants up CGA.  SPT to w/c CGA.  Hand hygiene with set up.  Assisted to therapy gym via w/c.  Completed upper body ergometer for 7.5 minutes without difficulty.  Reviewed and practiced hip/leg and lower back mobilization and light stretch with good results, limited ability to stretch right hip due to increased knee pain.  Provided ice pack for right knee, returned to room to await upcoming PT session.  Call bell  in reach, husband present.       Therapy Documentation Precautions:  Precautions Precautions: Fall Required Braces or Orthoses: Knee Immobilizer - Right Knee Immobilizer - Right: Other (comment) Restrictions Weight Bearing Restrictions: Yes RLE Weight Bearing: Weight bearing as tolerated   Therapy/Group: Individual Therapy  Barrie Lyme 04/12/2021, 7:28 AM

## 2021-04-12 NOTE — Progress Notes (Signed)
Physical Therapy Session Note  Patient Details  Name: Katie Serrano MRN: [id number] Date of Birth: 01-25-1956  Today's Date: 04/12/2021 PT Individual Time: 1100-1200 and 1445-1530 PT Individual Time Calculation (min): 45 min and 60' in AM  Short Term Goals: Week 1:  PT Short Term Goal 1 (Week 1): Pt will perform supine <>sit EOB w/min A for improved bed mobility PT Short Term Goal 2 (Week 1): Pt will initiate gait training w/LRAD for improved functional mobility PT Short Term Goal 3 (Week 1): Pt will perform stand pivot w/LRAD and CGA for improved functional mobility  Skilled Therapeutic Interventions/Progress Updates:   First session:  Pt presents supine in bed and agreeable to therapy.  Pt performs sup to sit transfer w/ CGA, bridging to EOB and then able to bring RLE to EOB and then use towel to assist to sitting.  Pt eager to perform so can be home sooner.  Pt performed knee slides/flexion on floor w/ pillow case for friction control.  Pt transferred sit to stand w/ min A and then min to mod A for step-pivot to w/c.  Pt wheeled to main gym  for time conservation.  Pt amb multiple trials w/ RW and min A w/ improving step length, foot clearance (including LLE), and gait cycle including heel strike as IC.  Pt amb 15', 15', 20' and 35'.  Pt encouraged by progress.  Pt remained sitting in w/c w/ chair alarm on and all needs in reach.  Second session:  Pt presents sitting in w/c w/ c/o increased pain to R knee and LB, but still agreeable to participate w/ therapy.  Pt wheeled to main gym for time conservation.  Pt attempt toe taps to cone w/ RLE only, but unable.  Pt then performs 2 x 8 toetaps to 1" step w/ improving knee flexion as opposed to hip hike.  Pt performed step over and back "picture strip" w/ R LE only.  Pt returned to room and amb 10' from doorway to bed w/ min A and improved reciprocal gait cycle and foot clearance.  Pt transfers sit to supine w/ min A for RLE only.  Pt able to bridge  w/ LLE to move to center of bed.  Ice pack applied to R knee as well as LB/hip for pain relief.  Bed alarm on and all needs in reach, spouse present.     Therapy Documentation Precautions:  Precautions Precautions: Fall Required Braces or Orthoses: Knee Immobilizer - Right Knee Immobilizer - Right: Other (comment) Restrictions Weight Bearing Restrictions: Yes RLE Weight Bearing: Weight bearing as tolerated General:   Vital Signs: Therapy Vitals Temp: 98.5 F (36.9 C) Temp Source: Oral Pulse Rate: 62 Resp: 17 BP: 125/60 Patient Position (if appropriate): Lying Oxygen Therapy SpO2: 100 % O2 Device: Room Air Pain:5/10 LB w/ pain meds. Pain Assessment Pain Scale: 0-10 Pain Score: 0-No pain Pain Type: Surgical pain Pain Location: Knee Pain Orientation: Right Pain Descriptors / Indicators: Throbbing;Burning Pain Frequency: Constant Pain Onset: On-going Patients Stated Pain Goal: 0 Pain Intervention(s): Pain med given for lower pain score than stated, per patient request Multiple Pain Sites: No     Therapy/Group: Individual Therapy  Lucio Edward 04/12/2021, 4:07 PM

## 2021-04-13 LAB — GLUCOSE, CAPILLARY
Glucose-Capillary: 170 mg/dL — ABNORMAL HIGH (ref 70–99)
Glucose-Capillary: 206 mg/dL — ABNORMAL HIGH (ref 70–99)
Glucose-Capillary: 167 mg/dL — ABNORMAL HIGH (ref 70–99)
Glucose-Capillary: 102 mg/dL — ABNORMAL HIGH (ref 70–99)

## 2021-04-13 MED ORDER — SORBITOL 70 % SOLN
60.0000 mL | Status: AC
  Administered 2021-04-13: 60 mL via ORAL
  Filled 2021-04-13: qty 60

## 2021-04-13 MED ORDER — INSULIN GLARGINE-YFGN 100 UNIT/ML ~~LOC~~ SOLN
22.0000 [IU] | Freq: Every day | SUBCUTANEOUS | Status: DC
  Administered 2021-04-14 – 2021-04-18 (×5): 22 [IU] via SUBCUTANEOUS
  Filled 2021-04-13 (×6): qty 0.22

## 2021-04-13 MED ORDER — ASPIRIN EC 81 MG PO TBEC
81.0000 mg | DELAYED_RELEASE_TABLET | Freq: Every day | ORAL | Status: DC
  Administered 2021-04-13 – 2021-04-18 (×6): 81 mg via ORAL
  Filled 2021-04-13 (×6): qty 1

## 2021-04-13 NOTE — Plan of Care (Signed)
  Problem: Consults Goal: RH STROKE PATIENT EDUCATION Description: See Patient Education module for education specifics  Outcome: Progressing   Problem: RH BOWEL ELIMINATION Goal: RH STG MANAGE BOWEL WITH ASSISTANCE Description: STG Manage Bowel with mod I  Assistance. Outcome: Progressing Goal: RH STG MANAGE BOWEL W/MEDICATION W/ASSISTANCE Description: STG Manage Bowel with Medication with mod I Assistance. Outcome: Progressing   Problem: RH BLADDER ELIMINATION Goal: RH STG MANAGE BLADDER WITH ASSISTANCE Description: STG Manage Bladder With  independently ,no Assistance Outcome: Progressing   Problem: RH SKIN INTEGRITY Goal: RH STG ABLE TO PERFORM INCISION/WOUND CARE W/ASSISTANCE Description: STG Able To Perform Incision/Wound Care With min  Assistance. Outcome: Progressing   Problem: RH SAFETY Goal: RH STG ADHERE TO SAFETY PRECAUTIONS W/ASSISTANCE/DEVICE Description: STG Adhere to Safety Precautions With cues/reminders Assistance/Device. Outcome: Progressing   Problem: RH PAIN MANAGEMENT Goal: RH STG PAIN MANAGED AT OR BELOW PT'S PAIN GOAL Description: At or below level 4 Outcome: Progressing   Problem: RH KNOWLEDGE DEFICIT Goal: RH STG INCREASE KNOWLEDGE OF DIABETES Description: Patient will be able to manage DM with medications and dietary modifications using handouts and educational resources independently Outcome: Progressing Goal: RH STG INCREASE KNOWLEDGE OF HYPERTENSION Description: Patient will be able to manage HTN with medications and dietary modifications using handouts and educational resources independently Outcome: Progressing Goal: RH STG INCREASE KNOWLEGDE OF HYPERLIPIDEMIA Description: Patient will be able to manage HLD with medications and dietary modifications using handouts and educational resources independently Outcome: Progressing Goal: RH STG INCREASE KNOWLEDGE OF STROKE PROPHYLAXIS Description: Patient will be able to manage secondary stroke  risks with medications and dietary modifications using handouts and educational resources independently Outcome: Progressing   Problem: Urinary Elimination: Goal: Signs and symptoms of infection will decrease Description: Patient will be able to manage infection prevention with medications and decrease risk of future infections using handouts and educational resources independently Outcome: Progressing   

## 2021-04-13 NOTE — Progress Notes (Signed)
Patient refused enema due to having large bowel movement. Cletis Media, LPN

## 2021-04-13 NOTE — IPOC Note (Signed)
Overall Plan of Care [facility]) Patient Details Name: Katie Serrano MRN: [id number] DOB: [birth year]  Admitting Diagnosis: Myositis  Hospital Problems: Principal Problem:   Myositis     Functional Problem List: Nursing Bladder, Endurance, Pain, Medication Management, Safety, Bowel, Skin Integrity  PT Balance, Endurance, Motor, Pain, Skin Integrity  OT Balance, Endurance, Pain  SLP    TR         Basic ADL's: OT Grooming, Bathing, Dressing, Toileting     Advanced  ADL's: OT Simple Meal Preparation     Transfers: PT Bed Mobility, Bed to Chair, Car, Furniture  OT Toilet     Locomotion: PT Ambulation, Psychologist, prison and probation services, Stairs     Additional Impairments: OT    SLP        TR      Anticipated Outcomes Item Anticipated Outcome  Self Feeding independent  Swallowing      Basic self-care  set up / min A  Toileting  CS   Bathroom Transfers CS  Bowel/Bladder  manage bowel w mod I and bladder independently  Transfers  Supervision-CGA  Locomotion  Min A  Communication     Cognition     Pain  at or below level 4  Safety/Judgment  Maintain safety with cues/reminders   Therapy Plan: PT Intensity: Minimum of 1-2 x/day ,45 to 90 minutes PT Frequency: 5 out of 7 days PT Duration Estimated Length of Stay: 18-21 days OT Intensity: Minimum of 1-2 x/day, 45 to 90 minutes OT Frequency: 5 out of 7 days OT Duration/Estimated Length of Stay: 2 weeks     Due to the current state of emergency, patients may not be receiving their 3-hours of Medicare-mandated therapy.   Team Interventions: Nursing Interventions Bladder Management, Disease Management/Prevention, Medication Management, Discharge Planning, Skin Care/Wound Management, Pain Management, Bowel Management, Patient/Family Education  PT interventions Ambulation/gait training, Community reintegration, DME/adaptive equipment instruction, Neuromuscular re-education, Psychosocial support, Stair training, UE/LE Strength  taining/ROM, Wheelchair propulsion/positioning, Warden/ranger, Discharge planning, Pain management, Skin care/wound management, Therapeutic Activities, UE/LE Coordination activities, Disease management/prevention, Functional mobility training, Patient/family education, Therapeutic Exercise  OT Interventions Balance/vestibular training, Discharge planning, DME/adaptive equipment instruction, Functional mobility training, Pain management, Patient/family education, Self Care/advanced ADL retraining, Therapeutic Activities, Therapeutic Exercise  SLP Interventions    TR Interventions    SW/CM Interventions Discharge Planning, Psychosocial Support, Patient/Family Education   Barriers to Discharge MD  Medical stability  Nursing Decreased caregiver support, Home environment access/layout, Wound Care, IV antibiotics, Weight bearing restrictions 1 level 3ste bil rails w spouse  PT IV antibiotics, Wound Care, Other (comments) pain  OT      SLP      SW       Team Discharge Planning: Destination: PT-Home ,OT- Home , SLP-  Projected Follow-up: PT-Home health PT, OT-  Home health OT, SLP-  Projected Equipment Needs: PT-To be determined, OT- 3 in 1 bedside comode, SLP-  Equipment Details: PT- , OT-  Patient/family involved in discharge planning: PT- Patient,  OT-Patient, Family member/caregiver, SLP-   MD ELOS: 15-20 days Medical Rehab Prognosis:  Excellent Assessment: The patient has been admitted for CIR therapies with the diagnosis of lumbar.paraspinal myositis, lumbar facet septic arthritis The team will be addressing functional mobility, strength, stamina, balance, safety, adaptive techniques and equipment, self-care, bowel and bladder mgt, patient and caregiver education, pain mgt, community reentry. Goals have been set at supervision to [facility] with self-care and mobility.   Due to the current state of emergency, patients may not be  receiving their 3 hours per day of Medicare-mandated  therapy.    Ranelle Oyster, MD, FAAPMR     See Team Conference Notes for weekly updates to the plan of care

## 2021-04-13 NOTE — Progress Notes (Addendum)
PROGRESS NOTE   Subjective/Complaints: Low back and left hip/groin still very sore. Worked hard in therapy and determined to get home  ROS: Patient denies fever, rash, sore throat, blurred vision, nausea, vomiting, diarrhea, cough, shortness of breath or chest pain,   headache, or mood change.    Objective:   No results found. Recent Labs    04/11/21 0223  WBC 16.6*  HGB 10.8*  HCT 33.1*  PLT 535*   Recent Labs    04/11/21 0223  NA 137  K 3.9  CL 102  CO2 29  GLUCOSE 184*  BUN 6*  CREATININE 0.69  CALCIUM 8.5*    Intake/Output Summary (Last 24 hours) at 04/13/2021 0810 Last data filed at 04/12/2021 1745 Gross per 24 hour  Intake 884 ml  Output --  Net 884 ml        Physical Exam: Vital Signs Blood pressure (!) 147/79, pulse 71, temperature 98.6 F (37 C), temperature source Oral, resp. rate 18, weight 82.9 kg, SpO2 96 %.  Constitutional: No distress . Vital signs reviewed. HEENT: NCAT, EOMI, oral membranes moist Neck: supple Cardiovascular: RRR without murmur. No JVD    Respiratory/Chest: CTA Bilaterally without wheezes or rales. Normal effort    GI/Abdomen: BS +, non-tender, non-distended Ext: no clubbing, cyanosis, or edema Psych: pleasant and cooperative  Skin: No evidence of breakdown, no evidence of rash. Right knee incision CDI with staples Neurologic: Cranial nerves II through XII intact, motor strength is 5/5 in bilateral deltoid, bicep, tricep, grip, hip flexor, 2- Righ tand 4/5 Left knee extensors, 4/5 B ankle dorsiflexor and plantar flexor Sensory exam normal sensation to light touch and proprioception in bilateral upper and lower extremities Cerebellar exam normal finger to nose to finger as well as heel to shin in bilateral upper and lower extremities Musculoskeletal: reduced ROM right knee with flexion, left low back,flank,hip pain.     Assessment/Plan: 1. Functional deficits which  require 3+ hours per day of interdisciplinary therapy in a comprehensive inpatient rehab setting. Physiatrist is providing close team supervision and 24 hour management of active medical problems listed below. Physiatrist and rehab team continue to assess barriers to discharge/monitor patient progress toward functional and medical goals  Care Tool:  Bathing    Body parts bathed by patient: Right arm, Left arm, Chest, Abdomen, Front perineal area, Right upper leg, Left upper leg, Face, Buttocks, Left lower leg   Body parts bathed by helper: Right lower leg     Bathing assist Assist Level: Minimal Assistance - Patient > 75%     Upper Body Dressing/Undressing Upper body dressing   What is the patient wearing?: Pull over shirt    Upper body assist Assist Level: Set up assist    Lower Body Dressing/Undressing Lower body dressing      What is the patient wearing?: Pants     Lower body assist Assist for lower body dressing: Minimal Assistance - Patient > 75%     Toileting Toileting    Toileting assist Assist for toileting: Contact Guard/Touching assist     Transfers Chair/bed transfer  Transfers assist  Chair/bed transfer activity did not occur: Safety/medical concerns (Pain, fatigue)  Locomotion Ambulation   Ambulation assist   Ambulation activity did not occur: Safety/medical concerns (Pain, fatigue)  Assist level: Minimal Assistance - Patient > 75% Assistive device: Walker-rolling Max distance: 10   Walk 10 feet activity   Assist  Walk 10 feet activity did not occur: Safety/medical concerns (Pain, fatigue)  Assist level: Minimal Assistance - Patient > 75% Assistive device: Walker-rolling   Walk 50 feet activity   Assist Walk 50 feet with 2 turns activity did not occur: Safety/medical concerns (Pain, fatigue)         Walk 150 feet activity   Assist Walk 150 feet activity did not occur: Safety/medical concerns (Pain, fatigue)          Walk 10 feet on uneven surface  activity   Assist Walk 10 feet on uneven surfaces activity did not occur: Safety/medical concerns (Pain, fatigue)         Wheelchair     Assist Will patient use wheelchair at discharge?: Yes Type of Wheelchair: Manual Wheelchair activity did not occur: Safety/medical concerns (Pain, fatigue)         Wheelchair 50 feet with 2 turns activity    Assist    Wheelchair 50 feet with 2 turns activity did not occur: Safety/medical concerns (Pain, fatigue)       Wheelchair 150 feet activity     Assist  Wheelchair 150 feet activity did not occur: Safety/medical concerns (Pain, fatigue)       Blood pressure (!) 147/79, pulse 71, temperature 98.6 F (37 C), temperature source Oral, resp. rate 18, weight 82.9 kg, SpO2 96 %.  Medical Problem List and Plan: 1.  Paraspinal myositis, lumbar facet septic arthritis MRSA bacteremia             -patient may shower but incisions must be covered             -ELOS/Goals: 2-3 weeks MinA             -Continue CIR therapies including PT, OT   2.  Antithrombotics: -DVT/anticoagulation:  Pharmaceutical: Lovenox             -antiplatelet therapy: N/a 3. Pain Management: Throbbing/jabbing at knee/thigh --Oxycodone prn not effective             --added Oxycontin 8/4 for more consistent relief. -8/6 increased to 15 mg q12 given ongoing pain   - Robaxin--> schedule qid  for muscle aches. 8/7 ordered kpad 4. Mood: LCSW to follow for evaluation and support.             -antipsychotic agents: N/A 5. Neuropsych: This patient is capable of making decisions on her own behalf. 6. Skin/Wound Care: Routine pressure relief measures.Told by ortho staples may be removed after 10d 7. Fluids/Electrolytes/Nutrition: Monitor I/O. Check CMET in am. 8. Disseminated MRSA infection: Vancomycin w/July 30th as day #1 for 6-8 weeks followed by extended oral course.             --weekly Sed rate, CRP, CMET, CBC              --WBC trending down 24.5-->17.9 9. T2DM: Hgb A1C- 9.8 and poorly controlled.             --Monitor BS ac/hs and use SSI for tighter control.             --Continue Insulin glargine 20 units with 9 units meal coverage.   CBG (last 3)  Recent Labs    04/12/21 1628 04/12/21 2105 04/13/21 0109  GLUCAP 170* 162* 170*  Increased semglee to 20u 8/7  10. HTN: Monitor BP TID- continues to be labile --continue hydralazine, Norvasc, Losartan and metoprolol Vitals:   04/12/21 1951 04/13/21 0413  BP: (!) 153/69 (!) 147/79  Pulse: 75 71  Resp: 18 18  Temp: 98.7 F (37.1 C) 98.6 F (37 C)  SpO2: 95% 96%    11. Severe constipation: last bm 8/2             --continue Senna S - 2 tabs bid -increase miralax to bid as well. 8/7 sorbitol 12. Septic Knee s/p debridement: WBAT with KI when up.  13.  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

## 2021-04-14 LAB — CBC
HCT: 31.4 % — ABNORMAL LOW (ref 36.0–46.0)
Hemoglobin: 9.9 g/dL — ABNORMAL LOW (ref 12.0–15.0)
MCH: 28.2 pg (ref 26.0–34.0)
MCHC: 31.5 g/dL (ref 30.0–36.0)
MCV: 89.5 fL (ref 80.0–100.0)
Platelets: 558 10*3/uL — ABNORMAL HIGH (ref 150–400)
RBC: 3.51 MIL/uL — ABNORMAL LOW (ref 3.87–5.11)
RDW: 13.9 % (ref 11.5–15.5)
WBC: 12.1 10*3/uL — ABNORMAL HIGH (ref 4.0–10.5)
nRBC: 0 % (ref 0.0–0.2)

## 2021-04-14 LAB — SEDIMENTATION RATE: Sed Rate: 97 mm/hr — ABNORMAL HIGH (ref 0–22)

## 2021-04-14 LAB — GLUCOSE, CAPILLARY
Glucose-Capillary: 145 mg/dL — ABNORMAL HIGH (ref 70–99)
Glucose-Capillary: 96 mg/dL (ref 70–99)
Glucose-Capillary: 137 mg/dL — ABNORMAL HIGH (ref 70–99)
Glucose-Capillary: 121 mg/dL — ABNORMAL HIGH (ref 70–99)

## 2021-04-14 LAB — BASIC METABOLIC PANEL
Anion gap: 7 (ref 5–15)
BUN: 6 mg/dL — ABNORMAL LOW (ref 8–23)
CO2: 28 mmol/L (ref 22–32)
Calcium: 8.8 mg/dL — ABNORMAL LOW (ref 8.9–10.3)
Chloride: 102 mmol/L (ref 98–111)
Creatinine, Ser: 0.79 mg/dL (ref 0.44–1.00)
GFR, Estimated: >60 mL/min (ref >60)
Glucose, Bld: 147 mg/dL — ABNORMAL HIGH (ref 70–99)
Potassium: 3.8 mmol/L (ref 3.5–5.1)
Sodium: 137 mmol/L (ref 135–145)

## 2021-04-14 LAB — C-REACTIVE PROTEIN: CRP: 7.7 mg/dL — ABNORMAL HIGH (ref <1.0)

## 2021-04-14 MED ORDER — OXYCODONE HCL ER 10 MG PO T12A
20.0000 mg | EXTENDED_RELEASE_TABLET | Freq: Two times a day (BID) | ORAL | Status: DC
  Administered 2021-04-14 – 2021-04-18 (×9): 20 mg via ORAL
  Filled 2021-04-14 (×9): qty 2

## 2021-04-14 NOTE — Progress Notes (Signed)
Physical Therapy Session Note  Patient Details  Name: Katie Serrano MRN: [id number] Date of Birth: [birth year]  Today's Date: 04/14/2021 PT Individual Time: 1030-1121 PT Individual Time Calculation (min): 51 min   Short Term Goals: Week 1:  PT Short Term Goal 1 (Week 1): Pt will perform supine <>sit EOB w/min A for improved bed mobility PT Short Term Goal 2 (Week 1): Pt will initiate gait training w/LRAD for improved functional mobility PT Short Term Goal 3 (Week 1): Pt will perform stand pivot w/LRAD and CGA for improved functional mobility  Skilled Therapeutic Interventions/Progress Updates:  Pt received sitting in recliner in room w/husband and no safety belt donned. Pt reported she was "ready to go home" and we "better let her leave by Thursday". Informed pt about team conference tomorrow to set DC date, but pt very adamant she could "go home today". Pt reported 4/10 pain in RLE and was agreeable to PT. Emphasis of session on gait training, energy conservation and improved transfers. Pt performed sit <>stands from WC to RW w/CGA throughout session. Pt transferred from recliner to [facility] and self-propelled 180' w/supervision and set-up assist. Pt then ambulated 150' w/RW and CGA, requiring a few standing rest breaks. Noted pt had forward trunk lean, steppage gait pattern and decreased step length on L 2/2 decreased stance time on R. Provided verbal cues for pt to keep her head up and maintain safe distance to RW which reduced forward lean posture. Pt ascended/descended 4 6" steps x2 w/BUE support on rails and CGA. Provided verbal cues to remind pt which foot to lead with which pt was able to teach back. Educated pt to use step-to pattern, which pt reluctantly agreed to. Pt was transported to ADL apartment w/total A for time management and performed stand pivot from [facility] to loveseat w/min A and no AD. Sit <>stand pivot from loveseat to [facility] w/RW and CGA. Educated pt to use AD inside home even when she feels  she does not need it, pt agreed. Pt was left w/OT as handoff w/all needs met.   Therapy Documentation Precautions:  Precautions Precautions: Fall Required Braces or Orthoses: Knee Immobilizer - Right Knee Immobilizer - Right: Other (comment) Restrictions Weight Bearing Restrictions: Yes RLE Weight Bearing: Weight bearing as tolerated   Therapy/Group: Individual Therapy Cruzita Lederer Nichole Keltner, PT, DPT  04/14/2021, 7:41 AM

## 2021-04-14 NOTE — Progress Notes (Signed)
Occupational Therapy Session Note  Patient Details  Name: Katie Serrano MRN: [id number] Date of Birth: Sep 27, 1955  Today's Date: 04/14/2021 OT Individual Time: 0900-1000   &   1115-1155 OT Individual Time Calculation (min): 60 min    &   40 min (session added to schedule as she was under-scheduled today)   Short Term Goals: Week 1:  OT Short Term Goal 1 (Week 1): patient will complete bed mobility with min A OT Short Term Goal 2 (Week 1): patient will complete SPT with RW CGA OT Short Term Goal 3 (Week 1): patient will complete UB adl with set up OT Short Term Goal 4 (Week 1): patient will complete LB adl with min/mod A OT Short Term Goal 5 (Week 1): patient will complete toileting with CGA  Skilled Therapeutic Interventions/Progress Updates:    Session 1:   patient seated on commode, awaiting assistance to complete toileting - she is able to complete hygiene with CS.  SPT commode to/from w/c with CS.  She completed bathing and dressing tasks w/c level at sink with set up and min cues for technique to safely reach feet.  Grooming tasks mod I with exception of hair washing using shampoo tray.  Provided right elevating leg rest due to report of mild edema over the weekend - reviewed edema management techniques.  Ambulation with RW in room to recliner with CS.  Reviewed positioning with recliner - she states increased comfort.  Reviewed basic reach and transport of items using RW in home environment.  She remained seated in recliner at close of session, call bell and tray table in reach - husband present.    Session 2:  patient seated in w/c, finishing PT session and agreeable to additional time due to under scheduling.   Reviewed and practiced side stepping with standard walker through narrow opening to standard commode - she is able to complete with CS.  Reviewed set up and adjustment of commode for use either bed side or over toilet.  She demonstrates good understanding.   Reviewed and practiced  basic HM in kitchen environment - min cues for hand placement at times for reach and transport of items.  She ambulated with RW CS to recliner at close of session - she denies pain or need for ice pack.  Call bell and tray table in reach, husband present.    Therapy Documentation Precautions:  Precautions Precautions: Fall Required Braces or Orthoses: Knee Immobilizer - Right Knee Immobilizer - Right: Other (comment) Restrictions Weight Bearing Restrictions: Yes RLE Weight Bearing: Weight bearing as tolerated   Therapy/Group: Individual Therapy  Barrie Lyme 04/14/2021, 7:28 AM

## 2021-04-14 NOTE — Progress Notes (Signed)
Patient ID: AYJAH SHOW, female   DOB: 1955-12-18, 65 y.o.   MRN: [id number] Met with the patient and spouse to introduce self and the role of the nurse CM. Reviewed medications including Semglee/meal coverage and risk factors including HTN, HLD, DM (A1c 9.8). Patient on metformin PTA and would like to avoid insulin if at all possible. Reviewed A1C level PTA, infection, wound healing, etc. Given handouts on heart healthy diet, carb counting, insulin administration and smoking cessation. Patient notes she does not have the need for a cigarette and feels like she will successfully be able to stop smoking since she has been a while without smoking. Voiced concerns about IV abx for 6-8 weeks. PICC line in place. Continue to follow along to discharge to address educational needs and collaborate with the SW to facilitate preparation for discharge. Margarito Liner, RN

## 2021-04-14 NOTE — Progress Notes (Signed)
PROGRESS NOTE   Subjective/Complaints:  Pt reports pain was 8/10 all night- finally did get oxy short acting at midnight, but didn't "know she could ask for anything".  Pain spikes at night overall.  Did get kpad.  Can't understand why has pain L groin to back- explained had septic arthritis.  Also reports LBM was last night.    ROS:  Pt denies SOB, abd pain, CP, N/V/C/D, and vision changes   Objective:   No results found. Recent Labs    04/14/21 0330  WBC 12.1*  HGB 9.9*  HCT 31.4*  PLT 558*   Recent Labs    04/14/21 0330  NA 137  K 3.8  CL 102  CO2 28  GLUCOSE 147*  BUN 6*  CREATININE 0.79  CALCIUM 8.8*    Intake/Output Summary (Last 24 hours) at 04/14/2021 1934 Last data filed at 04/14/2021 1659 Gross per 24 hour  Intake 590 ml  Output --  Net 590 ml        Physical Exam: Vital Signs Blood pressure 103/61, pulse 60, temperature 98 F (36.7 C), temperature source Oral, resp. rate 16, weight 82.9 kg, SpO2 97 %.    General: awake, alert, appropriate, laying in bed; husband at bedside; NAD HENT: conjugate gaze; oropharynx moist CV: regular rate; no JVD Pulmonary: CTA B/L; no W/R/R- good air movement GI: soft, NT, ND, (+)BS; hypoactive Psychiatric: appropriate; c/o pain Neurological: alert  Ext: no clubbing, cyanosis, 1+ edema to knee on RLE; pointing at L groin to L back - the pain she's having Psych: pleasant and cooperative  Skin: No evidence of breakdown, no evidence of rash. Right knee incision CDI with staples Neurologic: Cranial nerves II through XII intact, motor strength is 5/5 in bilateral deltoid, bicep, tricep, grip, hip flexor, 2- Righ tand 4/5 Left knee extensors, 4/5 B ankle dorsiflexor and plantar flexor Sensory exam normal sensation to light touch and proprioception in bilateral upper and lower extremities Cerebellar exam normal finger to nose to finger as well as heel to shin in  bilateral upper and lower extremities Musculoskeletal: reduced ROM right knee with flexion, left low back,flank,hip pain.     Assessment/Plan: 1. Functional deficits which require 3+ hours per day of interdisciplinary therapy in a comprehensive inpatient rehab setting. Physiatrist is providing close team supervision and 24 hour management of active medical problems listed below. Physiatrist and rehab team continue to assess barriers to discharge/monitor patient progress toward functional and medical goals  Care Tool:  Bathing    Body parts bathed by patient: Right arm, Left arm, Chest, Abdomen, Front perineal area, Right upper leg, Left upper leg, Face, Buttocks, Left lower leg, Right lower leg   Body parts bathed by helper: Right lower leg     Bathing assist Assist Level: Supervision/Verbal cueing     Upper Body Dressing/Undressing Upper body dressing   What is the patient wearing?: Pull over shirt    Upper body assist Assist Level: Set up assist    Lower Body Dressing/Undressing Lower body dressing      What is the patient wearing?: Pants     Lower body assist Assist for lower body dressing: Supervision/Verbal cueing  Toileting Toileting    Toileting assist Assist for toileting: Supervision/Verbal cueing     Transfers Chair/bed transfer  Transfers assist  Chair/bed transfer activity did not occur: Safety/medical concerns (Pain, fatigue)  Chair/bed transfer assist level: Contact Guard/Touching assist     Locomotion Ambulation   Ambulation assist   Ambulation activity did not occur: Safety/medical concerns (Pain, fatigue)  Assist level: Contact Guard/Touching assist Assistive device: Walker-rolling Max distance: 150'   Walk 10 feet activity   Assist  Walk 10 feet activity did not occur: Safety/medical concerns (Pain, fatigue)  Assist level: Contact Guard/Touching assist Assistive device: Walker-rolling   Walk 50 feet activity   Assist  Walk 50 feet with 2 turns activity did not occur: Safety/medical concerns (Pain, fatigue)  Assist level: Contact Guard/Touching assist Assistive device: Walker-rolling    Walk 150 feet activity   Assist Walk 150 feet activity did not occur: Safety/medical concerns (Pain, fatigue)  Assist level: Contact Guard/Touching assist Assistive device: Walker-rolling    Walk 10 feet on uneven surface  activity   Assist Walk 10 feet on uneven surfaces activity did not occur: Safety/medical concerns (Pain, fatigue)         Wheelchair     Assist Will patient use wheelchair at discharge?: Yes Type of Wheelchair: Manual Wheelchair activity did not occur: Safety/medical concerns (Pain, fatigue)  Wheelchair assist level: Set up assist, Supervision/Verbal cueing Max wheelchair distance: 180'    Wheelchair 50 feet with 2 turns activity    Assist    Wheelchair 50 feet with 2 turns activity did not occur: Safety/medical concerns (Pain, fatigue)   Assist Level: Supervision/Verbal cueing   Wheelchair 150 feet activity     Assist  Wheelchair 150 feet activity did not occur: Safety/medical concerns (Pain, fatigue)   Assist Level: Supervision/Verbal cueing   Blood pressure 103/61, pulse 60, temperature 98 F (36.7 C), temperature source Oral, resp. rate 16, weight 82.9 kg, SpO2 97 %.  Medical Problem List and Plan: 1.  Paraspinal myositis, lumbar facet septic arthritis MRSA bacteremia             -patient may shower but incisions must be covered             -ELOS/Goals: 2-3 weeks MinA             -con't PT and OT/CIR  2.  Antithrombotics: -DVT/anticoagulation:  Pharmaceutical: Lovenox             -antiplatelet therapy: N/a 3. Pain Management: Throbbing/jabbing at knee/thigh --Oxycodone prn not effective             --added Oxycontin 8/4 for more consistent relief. -8/6 increased to 15 mg q12 given ongoing pain   - Robaxin--> schedule qid  for muscle aches. 8/7 ordered  kpad  8/8- increased oxycontin to 20 mg BID and con't prn Oxy- sounds like she's not asking much for prns- recommended to husband to ask for her when he's here.  4. Mood: LCSW to follow for evaluation and support.             -antipsychotic agents: N/A 5. Neuropsych: This patient is capable of making decisions on her own behalf. 6. Skin/Wound Care: Routine pressure relief measures.Told by ortho staples may be removed after 10d  8/8- can remove staples tomorrow if looks OK 7. Fluids/Electrolytes/Nutrition: Monitor I/O. Check CMET in am. 8. Disseminated MRSA infection: Vancomycin w/July 30th as day #1 for 6-8 weeks followed by extended oral course.             --  weekly Sed rate, CRP, CMET, CBC             --WBC trending down 24.5-->17.9  8/8- WBC 12.1- and CRP down to 7.7 - was 11.6 and WBC was 16.9 9. T2DM: Hgb A1C- 9.8 and poorly controlled.             --Monitor BS ac/hs and use SSI for tighter control.             --Continue Insulin glargine 20 units with 9 units meal coverage.   CBG (last 3)  Recent Labs    04/14/21 0620 04/14/21 1152 04/14/21 1715  GLUCAP 145* 96 137*  Increased semglee to 20u 8/7  8/8- BG's look great- con't regimen 10. HTN: Monitor BP TID- continues to be labile --continue hydralazine, Norvasc, Losartan and metoprolol Vitals:   04/14/21 0350 04/14/21 1353  BP: 135/63 103/61  Pulse: 68 60  Resp: 18 16  Temp: 98.8 F (37.1 C) 98 F (36.7 C)  SpO2: 98% 97%    11. Severe constipation: last bm 8/2             --continue Senna S - 2 tabs bid -increase miralax to bid as well. 8/7 sorbitol 12. Septic Knee s/p debridement: WBAT with KI when up.  13.  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

## 2021-04-15 LAB — GLUCOSE, CAPILLARY
Glucose-Capillary: 140 mg/dL — ABNORMAL HIGH (ref 70–99)
Glucose-Capillary: 110 mg/dL — ABNORMAL HIGH (ref 70–99)
Glucose-Capillary: 134 mg/dL — ABNORMAL HIGH (ref 70–99)
Glucose-Capillary: 152 mg/dL — ABNORMAL HIGH (ref 70–99)

## 2021-04-15 LAB — VANCOMYCIN, TROUGH: Vancomycin Tr: 17 ug/mL (ref 15–20)

## 2021-04-15 LAB — VANCOMYCIN, PEAK: Vancomycin Pk: 45 ug/mL — ABNORMAL HIGH (ref 30–40)

## 2021-04-15 MED ORDER — VANCOMYCIN IV (FOR PTA / DISCHARGE USE ONLY): 1000.0000 mg | Freq: Two times a day (BID) | INTRAVENOUS | 0 refills | Status: DC

## 2021-04-15 NOTE — Progress Notes (Signed)
Pharmacy Antibiotic Note  Katie Serrano is a 65 y.o. female on Vancomycin since 04/02/21 for MRSA bacteremia.  Pharmacy has been consulted for vancomycin dosing. Vanc to continue for 6 weeks from last positive culture per ID recommendation, thru 05/19/21.   Levels today on Vancomycin 1gm IV q12h.    - peak = 45 mcg/ml (~ 1hr after end of infusion)    - trough = 17 mcg/ml   Dose infused over 1 hour and levels drawn appropriately. Peak level is higher than expected and calculated AUC of 712 is higher than estimated 475. Calculations suggest decreasing dose.  However, trough level is at goal of 15-20 mcg/ml with CNS involvement, so hesitant to decrease dose. Creatinine stable. Trough level indicates adequate drug clearance between doses.   Plan: Continue Vancomycin 1gm IV q12h. Monitor renal function. Bmet on Mondays and Thursdays. Plan to recheck Vanc trough weekly on Mondays. Target troughs 15-20 mcg/ml Vancomycin to continue thru 05/19/21.  Weight: 82.9 kg (182 lb 12.2 oz)  Temp (24hrs), Avg:98.6 F (37 C), Min:98.4 F (36.9 C), Max:98.7 F (37.1 C)  Recent Labs  Lab 04/10/21 0400 04/11/21 0223 04/11/21 0636 04/14/21 0330 04/15/21 1027 04/15/21 1303  WBC 17.9* 16.6*  --  12.1*  --   --   CREATININE 0.72 0.69  --  0.79  --   --   VANCOTROUGH  --   --  18  --  17  --   VANCOPEAK  --  23*  --   --   --  45*     Estimated Creatinine Clearance: 76 mL/min (by C-G formula based on SCr of 0.79 mg/dL).    No Known Allergies  Dennie Fetters, RPh 04/15/2021, 3:53 PM

## 2021-04-15 NOTE — Progress Notes (Signed)
Occupational Therapy Session Note  Patient Details  Name: Katie Serrano MRN: [id number] Date of Birth: Sep 03, 1956  Today's Date: 04/15/2021 OT Individual Time: 8466-5993 OT Individual Time Calculation (min): 70 min    Short Term Goals: Week 1:  OT Short Term Goal 1 (Week 1): patient will complete bed mobility with min A OT Short Term Goal 2 (Week 1): patient will complete SPT with RW CGA OT Short Term Goal 3 (Week 1): patient will complete UB adl with set up OT Short Term Goal 4 (Week 1): patient will complete LB adl with min/mod A OT Short Term Goal 5 (Week 1): patient will complete toileting with CGA   Skilled Therapeutic Interventions/Progress Updates:    Pt sitting up in w/c, heating pad applied to RLE.  Reports 5/10 pain in RLE and low back.  Agreeable to OT session and requesting to wash up then use the toilet.    All functional mobility completed with supervision including sit<>stand at w/c and toilet, and stand pivot w/c<>toilet.  Pt completed oral hygiene and general grooming, UB bathing and dressing with setup seated sinkside.  Pt completed LB bathing with supervision (not including bilateral feet; pt requesting to defer this body part).  Therapist educated pt on benefits of using long handled sponge to reach feet safely and pt reports she owns one at home already.  Pt required supervision to donn/doff loose shorts and max assist to donn/doff grip socks however pts husband eager to assist pt in this task and both report no need for training on AE for footwear at this time.  Pt also reports she plans on wearing loose summer dresses mostly at home.    After ADL training, pt transported to dayroom and participated in sit<>stand transfer and dynamic standing challenge with minimal use of RW for balance.  Pt required close supervision when leaning outside BOS to reach bean bags.  Returned to room and reapplied heating pad to right knee. Call bell in reach, seat alarm on.  Therapy  Documentation Precautions:  Precautions Precautions: Fall Required Braces or Orthoses: Knee Immobilizer - Right Knee Immobilizer - Right: Other (comment) Restrictions Weight Bearing Restrictions: Yes RLE Weight Bearing: Partial weight bearing    Therapy/Group: Individual Therapy  Amie Critchley 04/15/2021, 12:39 PM

## 2021-04-15 NOTE — Progress Notes (Signed)
PROGRESS NOTE   Subjective/Complaints:  Pt reports pain down to 5/10 on average- doing " a little better".  No other complaints today except pain- explained will take awhile until pain is completely controlled- and will take time.   ROS:   Pt denies SOB, abd pain, CP, N/V/C/D, and vision changes    Objective:   No results found. Recent Labs    04/14/21 0330  WBC 12.1*  HGB 9.9*  HCT 31.4*  PLT 558*   Recent Labs    04/14/21 0330  NA 137  K 3.8  CL 102  CO2 28  GLUCOSE 147*  BUN 6*  CREATININE 0.79  CALCIUM 8.8*    Intake/Output Summary (Last 24 hours) at 04/15/2021 1031 Last data filed at 04/15/2021 0755 Gross per 24 hour  Intake 850 ml  Output --  Net 850 ml        Physical Exam: Vital Signs Blood pressure 122/73, pulse 67, temperature 98.4 F (36.9 C), temperature source Oral, resp. rate 18, weight 82.9 kg, SpO2 98 %.   General: awake, alert, appropriate, up in w/c heading out of room with PT; NAD HENT: conjugate gaze; oropharynx moist CV: regular rate; no JVD Pulmonary: CTA B/L; no W/R/R- good air movement GI: soft, NT, ND, (+)BS Psychiatric: appropriate Neurological: alert  Ext: R knee- incision- staples- a lot of staples- intact- a reddish bruise vs localized erythema Psych: pleasant and cooperative  Skin: No evidence of breakdown, no evidence of rash. Right knee incision CDI with staples Neurologic: Cranial nerves II through XII intact, motor strength is 5/5 in bilateral deltoid, bicep, tricep, grip, hip flexor, 2- Righ tand 4/5 Left knee extensors, 4/5 B ankle dorsiflexor and plantar flexor Sensory exam normal sensation to light touch and proprioception in bilateral upper and lower extremities Cerebellar exam normal finger to nose to finger as well as heel to shin in bilateral upper and lower extremities Musculoskeletal: reduced ROM right knee with flexion, left low back,flank,hip pain.      Assessment/Plan: 1. Functional deficits which require 3+ hours per day of interdisciplinary therapy in a comprehensive inpatient rehab setting. Physiatrist is providing close team supervision and 24 hour management of active medical problems listed below. Physiatrist and rehab team continue to assess barriers to discharge/monitor patient progress toward functional and medical goals  Care Tool:  Bathing    Body parts bathed by patient: Right arm, Left arm, Chest, Abdomen, Front perineal area, Right upper leg, Left upper leg, Face, Buttocks, Left lower leg, Right lower leg   Body parts bathed by helper: Right lower leg     Bathing assist Assist Level: Supervision/Verbal cueing     Upper Body Dressing/Undressing Upper body dressing   What is the patient wearing?: Pull over shirt    Upper body assist Assist Level: Set up assist    Lower Body Dressing/Undressing Lower body dressing      What is the patient wearing?: Pants     Lower body assist Assist for lower body dressing: Supervision/Verbal cueing     Toileting Toileting    Toileting assist Assist for toileting: Supervision/Verbal cueing     Transfers Chair/bed transfer  Transfers assist  Chair/bed  transfer activity did not occur: Safety/medical concerns (Pain, fatigue)  Chair/bed transfer assist level: Contact Guard/Touching assist     Locomotion Ambulation   Ambulation assist   Ambulation activity did not occur: Safety/medical concerns (Pain, fatigue)  Assist level: Contact Guard/Touching assist Assistive device: Walker-rolling Max distance: 150'   Walk 10 feet activity   Assist  Walk 10 feet activity did not occur: Safety/medical concerns (Pain, fatigue)  Assist level: Contact Guard/Touching assist Assistive device: Walker-rolling   Walk 50 feet activity   Assist Walk 50 feet with 2 turns activity did not occur: Safety/medical concerns (Pain, fatigue)  Assist level: Contact  Guard/Touching assist Assistive device: Walker-rolling    Walk 150 feet activity   Assist Walk 150 feet activity did not occur: Safety/medical concerns (Pain, fatigue)  Assist level: Contact Guard/Touching assist Assistive device: Walker-rolling    Walk 10 feet on uneven surface  activity   Assist Walk 10 feet on uneven surfaces activity did not occur: Safety/medical concerns (Pain, fatigue)         Wheelchair     Assist Will patient use wheelchair at discharge?: Yes Type of Wheelchair: Manual Wheelchair activity did not occur: Safety/medical concerns (Pain, fatigue)  Wheelchair assist level: Set up assist, Supervision/Verbal cueing Max wheelchair distance: 180'    Wheelchair 50 feet with 2 turns activity    Assist    Wheelchair 50 feet with 2 turns activity did not occur: Safety/medical concerns (Pain, fatigue)   Assist Level: Supervision/Verbal cueing   Wheelchair 150 feet activity     Assist  Wheelchair 150 feet activity did not occur: Safety/medical concerns (Pain, fatigue)   Assist Level: Supervision/Verbal cueing   Blood pressure 122/73, pulse 67, temperature 98.4 F (36.9 C), temperature source Oral, resp. rate 18, weight 82.9 kg, SpO2 98 %.  Medical Problem List and Plan: 1.  Paraspinal myositis, lumbar facet septic arthritis MRSA bacteremia             -patient may shower but incisions must be covered             -ELOS/Goals: 2-3 weeks MinA             -con't PT and OT 2.  Antithrombotics: -DVT/anticoagulation:  Pharmaceutical: Lovenox             -antiplatelet therapy: N/a 3. Pain Management: Throbbing/jabbing at knee/thigh --Oxycodone prn not effective             --added Oxycontin 8/4 for more consistent relief. -8/6 increased to 15 mg q12 given ongoing pain   - Robaxin--> schedule qid  for muscle aches. 8/7 ordered kpad  8/8- increased oxycontin to 20 mg BID and con't prn Oxy- sounds like she's not asking much for prns-  recommended to husband to ask for her when he's here.  8/9- pain down to 5/10- con't regimen 4. Mood: LCSW to follow for evaluation and support.             -antipsychotic agents: N/A 5. Neuropsych: This patient is capable of making decisions on her own behalf. 6. Skin/Wound Care: Routine pressure relief measures.Told by ortho staples may be removed after 10d  8/8- can remove staples tomorrow if looks OK  8/9- will remove tomorrow- follow skin closely- think erythema due to so many staples.  7. Fluids/Electrolytes/Nutrition: Monitor I/O. Check CMET in am. 8. Disseminated MRSA infection: Vancomycin w/July 30th as day #1 for 6-8 weeks followed by extended oral course.             --  weekly Sed rate, CRP, CMET, CBC             --WBC trending down 24.5-->17.9  8/8- WBC 12.1- and CRP down to 7.7 - was 11.6 and WBC was 16.9 9. T2DM: Hgb A1C- 9.8 and poorly controlled.             --Monitor BS ac/hs and use SSI for tighter control.             --Continue Insulin glargine 20 units with 9 units meal coverage.   CBG (last 3)  Recent Labs    04/14/21 1715 04/14/21 2052 04/15/21 0559  GLUCAP 137* 121* 140*  Increased semglee to 20u 8/7  8/9- BG's look great- con't regimen 10. HTN: Monitor BP TID- continues to be labile --continue hydralazine, Norvasc, Losartan and metoprolol Vitals:   04/14/21 1948 04/15/21 0419  BP: (!) 147/63 122/73  Pulse: 83 67  Resp: 18 18  Temp: 98.7 F (37.1 C) 98.4 F (36.9 C)  SpO2: 98% 98%    11. Severe constipation: last bm 8/2             --continue Senna S - 2 tabs bid -increase miralax to bid as well. 8/7 sorbitol 12. Septic Knee s/p debridement: WBAT with KI when up.  8/9- staples out tomorrow  13.  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

## 2021-04-15 NOTE — Progress Notes (Signed)
Physical Therapy Session Note  Patient Details  Name: Katie Serrano MRN: [id number] Date of Birth: July 14, 1956  Today's Date: 04/15/2021 PT Individual Time: 0800-0902 PT Individual Time Calculation (min): 62 min   Short Term Goals: Week 1:  PT Short Term Goal 1 (Week 1): Pt will perform supine <>sit EOB w/min A for improved bed mobility PT Short Term Goal 2 (Week 1): Pt will initiate gait training w/LRAD for improved functional mobility PT Short Term Goal 3 (Week 1): Pt will perform stand pivot w/LRAD and CGA for improved functional mobility  Skilled Therapeutic Interventions/Progress Updates:  Pt received supine in bed w/heat pack on R knee. Reported pain as 5/10 in low back and R knee and had received pain meds prior to session. Emphasis of session on transfers, gait training and endurance. Pt performed bed mobility w/supervision and sit <>stand w/supervision. Pt ambulated very slowly w/RW ~10' to [facility] w/supervision. Dr. Berline Chough present for rounds and cleared pt to shower w/therapy. Pt transported to 1st floor outdoor patio w/total A for time management. Performed sit <>stand and ambulated 85' on concrete w/RW around reflection fountain w/supervision. Pt required frequent standing rest breaks and demonstrated decreased step clearance, stance time on R, step length on L and increased reliance on UEs. Pt transported back to room w/total A and reported pain as 5/10 in low back/R knee. Pt was left sitting in WC in room w/heat pack on R knee, safety belt donned and all needs in reach.   Therapy Documentation Precautions:  Precautions Precautions: Fall Required Braces or Orthoses: Knee Immobilizer - Right Knee Immobilizer - Right: Other (comment) Restrictions Weight Bearing Restrictions: Yes RLE Weight Bearing: Partial weight bearing  Therapy/Group: Individual Therapy Jill Alexanders Kyrie Bun, PT, DPT  04/15/2021, 7:33 AM

## 2021-04-15 NOTE — Discharge Instructions (Addendum)
Inpatient Rehab Discharge Instructions  Katie Serrano Discharge date and time:    Activities/Precautions/ Functional Status: Activity: no lifting, driving, or strenuous exercise till cleared MD Diet: regular diet Wound Care: keep wound clean and dry. Contact Dr. Linna Caprice if you develop any problems with your incision/wound--redness, swelling, increase in pain, drainage or if you develop fever or chills.     Functional status:  ___ No restrictions     ___ Walk up steps independently ___ 24/7 supervision/assistance   ___ Walk up steps with assistance ___ Intermittent supervision/assistance  ___ Bathe/dress independently ___ Walk with walker     ___ Bathe/dress with assistance ___ Walk Independently    ___ Shower independently ___ Walk with assistance    ___ Shower with assistance _X__ No alcohol     ___ Return to work/school ________   Special Instructions: Wash incision with soap and water, pat dry and keep clean and dry--no ointments, creams or lotions.  Keep leg elevated when seated--wear support stockings during the day and ace wrap to mid thigh to keep swelling down.  3. You can purchase Milk of magnesia or use Fleets enema to help with severe constipation. Constipation will resolve as narcotics are tapered off.    COMMUNITY REFERRALS UPON DISCHARGE:    Home Health:   PT, OT, RN                 Agency: [facility]   PHONE: [phone]    Medical Equipment/Items Ordered:WHEELCHAIR & 3 IN 1                                                 Agency/Supplier:[facility]   [phone]    My questions have been answered and I understand these instructions. I will adhere to these goals and the provided educational materials after my discharge from the hospital.  Patient/Caregiver Signature _______________________________ Date __________  Clinician Signature _______________________________________ Date __________  Please bring this form and your medication list with  you to all your follow-up doctor's appointments.

## 2021-04-15 NOTE — Progress Notes (Addendum)
Patient ID: Katie Serrano, female   DOB: 02-02-1956, 65 y.o.   MRN: [id number]  Met with pt to update team conference goals of CGA-min level and target discharge date 8/12. She will talk with husband regarding day and time to come in for education prior to discharge. Will work toward Friday, made Pam-IV regarding discharge and she will need to do education with pt and husband as well. Work on discharge needs.  3:43 PM Husband in the room and reports he is here daily and they can just grab him for education due to here all of the time. Will let therapy team know and Pam-IV is aware and will educate this week regarding IV antibiotics. Pam-PA aware pt and husband want to use the [facility] pharmacy for meds prior to going home.

## 2021-04-15 NOTE — Progress Notes (Signed)
Occupational Therapy Session Note  Patient Details  Name: Katie Serrano MRN: [id number] Date of Birth: May 24, 1956  Today's Date: 04/15/2021 OT Individual Time: 1415-1458 OT Individual Time Calculation (min): 43 min    Short Term Goals: Week 1:  OT Short Term Goal 1 (Week 1): patient will complete bed mobility with min A OT Short Term Goal 2 (Week 1): patient will complete SPT with RW CGA OT Short Term Goal 3 (Week 1): patient will complete UB adl with set up OT Short Term Goal 4 (Week 1): patient will complete LB adl with min/mod A OT Short Term Goal 5 (Week 1): patient will complete toileting with CGA   Skilled Therapeutic Interventions/Progress Updates:    Pt greeted at time of session sitting up in wheelchair with k pad on knee, agreeable to OT session with 4/10 pain with pain meds due in 45 minutes and did not impact therapy. Pt declined ADL, transported to ortho gym for time and performed 1 round of dynamic standing at [facility] for hitting targets with unilateral support at RW and adhering to precautions but after approx 30 seconds pt with urgency needing to toilet. Transported to bathroom in tub room and stand pivot wheelchair <> toilet and 3/3 toilet tasks with CGA/close supervision. Room cleaned as appropriate d/t precautions. Remainder of session focused on family education/training with husband in room, husband providing Supervision as pt ambulated wheelchair > toilet > wheelchair in room with CGA/close supervision for family training. Pt/family had questions regarding IV abx and recommended speak with MD/nursing. Alarm on call bell in reach.   Therapy Documentation Precautions:  Precautions Precautions: Fall Required Braces or Orthoses: Knee Immobilizer - Right Knee Immobilizer - Right: Other (comment) Restrictions Weight Bearing Restrictions: Yes RLE Weight Bearing: Partial weight bearing     Therapy/Group: Individual Therapy  Erasmo Score 04/15/2021, 7:23 AM

## 2021-04-15 NOTE — Patient Care Conference (Signed)
Inpatient RehabilitationTeam Conference and Plan of Care Update Date: 04/15/2021   Time: 12:03 PM    Patient Name: Katie Serrano      Medical Record Number: [id number]  Date of Birth: Mar 06, 1956 Sex: Female         Room/Bed: 4W22C/4W22C-01 Payor Info: Payor: MEDICARE / Plan: MEDICARE PART A AND B / Product Type: *No Product type* /    Admit Date/Time:  04/10/2021  5:12 PM  Primary Diagnosis:  Myositis  Hospital Problems: Principal Problem:   Myositis    Expected Discharge Date: Expected Discharge Date: 04/18/21  Team Members Present: Physician leading conference: Dr. Genice Rouge Social Worker Present: Dossie Der, LCSW Nurse Present: Chana Bode, RN PT Present: Ernestene Kiel, PT OT Present: Ezra Sites, OT PPS Coordinator present : Fae Pippin, SLP     Current Status/Progress Goal Weekly Team Focus  Bowel/Bladder   cont of b and b  remai cont of b and b  help uti;ize bedside cammode   Swallow/Nutrition/ Hydration             ADL's   set up /CS for bathing/dressing, CS functional transfers and basic HM with RW  CS/set up  patient/ family education, DME/transfer training, pain management, adl training, discharge prep   Mobility   CGA transfers and gait, sup WC and bed mobility  Sup-CGA transfers, min A gait/stairs  Transfers, energy conservation, pain management, safety awareness w/RW, lateral weight shifting   Communication             Safety/Cognition/ Behavioral Observations            Pain   c/o 7/10 pain in back  decrease pain 2/10  medicate as needed   Skin   abrasion to right knee  eliminate infection and prevent furthur infection  foam to rt knee     Discharge Planning:  Home with husband who is able to assist and here daily to provide support   Team Discussion: Plan staples out tomorrow. Continue IV abx for some time. Ongoing education on medications.  Patient on target to meet rehab goals: yes, currently requires close min assist for  transfers, gait and steps and supervision for wheelchair mobility. Requires set up - supervision for ADLs. Goals for discharge set at supervision level.    *See Care Plan and progress notes for long and short-term goals.   Revisions to Treatment Plan:    Teaching Needs: Safety, DME use, insulin administration and energy conservation, etc  Current Barriers to Discharge: Decreased caregiver support, Home enviroment access/layout, and IV antibiotics  Possible Resolutions to Barriers: Family education     Medical Summary Current Status: continent B/B; refusing miralax; staples to be removed in AM; is having BM's per nursing; pain was 8/10- now 5/10 with increase in Oxycontin and giving prn oxycodone.  Barriers to Discharge: IV antibiotics;Medical stability;Wound care;Weight bearing restrictions;Weight  Barriers to Discharge Comments: will home on on IV ABX; with husband; Possible Resolutions to [employer] Focus: focus- increased oxycontin and has prn oxy short acting; d/c planning - d/c 8/12   Continued Need for Acute Rehabilitation Level of Care: The patient requires daily medical management by a physician with specialized training in physical medicine and rehabilitation for the following reasons: Direction of a multidisciplinary physical rehabilitation program to maximize functional independence : Yes Medical management of patient stability for increased activity during participation in an intensive rehabilitation regime.: Yes Analysis of laboratory values and/or radiology reports with any subsequent need for medication adjustment and/or  medical intervention. : Yes   I attest that I was present, lead the team conference, and concur with the assessment and plan of the team.   Chana Bode B 04/15/2021, 2:30 PM

## 2021-04-16 DIAGNOSIS — G8918 Other acute postprocedural pain: Secondary | ICD-10-CM

## 2021-04-16 DIAGNOSIS — E1165 Type 2 diabetes mellitus with hyperglycemia: Secondary | ICD-10-CM

## 2021-04-16 DIAGNOSIS — R7881 Bacteremia: Secondary | ICD-10-CM

## 2021-04-16 DIAGNOSIS — B9562 Methicillin resistant Staphylococcus aureus infection as the cause of diseases classified elsewhere: Secondary | ICD-10-CM

## 2021-04-16 DIAGNOSIS — Z794 Long term (current) use of insulin: Secondary | ICD-10-CM

## 2021-04-16 LAB — GLUCOSE, CAPILLARY
Glucose-Capillary: 138 mg/dL — ABNORMAL HIGH (ref 70–99)
Glucose-Capillary: 156 mg/dL — ABNORMAL HIGH (ref 70–99)
Glucose-Capillary: 207 mg/dL — ABNORMAL HIGH (ref 70–99)
Glucose-Capillary: 195 mg/dL — ABNORMAL HIGH (ref 70–99)

## 2021-04-16 NOTE — Progress Notes (Addendum)
Physical Therapy Session Note  Patient Details  Name: Katie Serrano MRN: [id number] Date of Birth: Jul 28, 1956  Today's Date: 04/16/2021 PT Individual Time: 0801-0924 PT Individual Time Calculation (min): 83 min   Short Term Goals: Week 1:  PT Short Term Goal 1 (Week 1): Pt will perform supine <>sit EOB w/min A for improved bed mobility PT Short Term Goal 2 (Week 1): Pt will initiate gait training w/LRAD for improved functional mobility PT Short Term Goal 3 (Week 1): Pt will perform stand pivot w/LRAD and CGA for improved functional mobility  Skilled Therapeutic Interventions/Progress Updates:  Pt received sitting in WC asleep but was easily aroused. Reported pain as 5/10 in low back, had not received pain meds and pt requested to receive them prior to therapy. Pt concerned over discoloration on the bottom of her R foot, nursing notified. Emphasis of session on improved RLE strengthening, lateral weight shifting for improved gait and prepare for DC home. Noted pt's RLE was significantly swollen, educated pt on performing ankle pumps and LAQ while seated to decrease pooling of fluid. Also educated on keeping LEs elevated, pt verbalized understanding. Pt performed transfers throughout session w/supervision and RW. Pt reported urgency to void, performed stand pivot and ambulated 10' to bathroom w/RW and supervision and voided continently. Pt donned/doffed pants w/supervision and performed peri care independently. Pain meds received and pt transported to day gym w/total A for time management. Pt performed 5x STS w/BUE support and supervision in 25s. Discussed result interpretation w/pt. Pt ambulated 16' w/RW and supervision, noted decreased step clearance on RLE and decreased step length of LLE. Alt. Toe taps on 4" step for improved lateral weight shifting and weightbearing on RLE, 3x5 per side w/BUE support on RW and supervision. Pt required increased time but was able to perform without increase in  pain. Pt transported back to room w/total A and reported pain as 5/10. Pt was left sitting in WC w/husband, heat pack on R knee and all needs in reach.   Therapy Documentation Precautions:  Precautions Precautions: Fall Required Braces or Orthoses: Knee Immobilizer - Right Knee Immobilizer - Right: Other (comment) Restrictions Weight Bearing Restrictions: Yes RLE Weight Bearing: Partial weight bearing   Therapy/Group: Individual Therapy Jill Alexanders Mashawn Brazil, PT, DPT  04/16/2021, 7:33 AM

## 2021-04-16 NOTE — Progress Notes (Signed)
Occupational Therapy Session Note  Patient Details  Name: Katie Serrano MRN: [id number] Date of Birth: [birth year]  Today's Date: 04/16/2021 OT Individual Time: 1009-1108 OT Individual Time Calculation (min): 59 min    Short Term Goals: Week 1:  OT Short Term Goal 1 (Week 1): patient will complete bed mobility with min A OT Short Term Goal 2 (Week 1): patient will complete SPT with RW CGA OT Short Term Goal 3 (Week 1): patient will complete UB adl with set up OT Short Term Goal 4 (Week 1): patient will complete LB adl with min/mod A OT Short Term Goal 5 (Week 1): patient will complete toileting with CGA  Skilled Therapeutic Interventions/Progress Updates:  Pt greeted seated in w/c with husband present. Pt reports wanting to have pain meds prior to session, waited for RN and began discussion about DC planning. Pts husabnd asking appropriate questions about set- up for ADLS with husband reporting pt will likley complete groomign tasks at kitchen table with wash basin set- up so that pt can complete task from w/c level. Pt reprots she wil likley sleep on her chaise lounge chair at home.  Remainder of session to focus on functional transfer training with pt completing multiple stand pivot transfers with S with RW, allowed husband to practice transfers and functional ambulation with pt. Pt very good at directing level of care. Worked on transfser to [employer] with pt directling level of assist during transfer with her husband. Pt attempting to lay straight back onto simulated chaise lounge however recommended that pt transition from sitting>side lying>roll on back as chaise lunge may not have enough depth to transition from sitting>supine. During transfer, pt reported urgency to urinate. Pt transported to room with total A where pt completed ambulatory toilet transfer with RW and gross supervision. Pt completed 3/3 toileting tasks with supervision. Pt stood for hand hygiene with  supervision, pt left seated in w/c with all needs within reach and safety belt activated.   Therapy Documentation Precautions:  Precautions Precautions: Fall Required Braces or Orthoses: Knee Immobilizer - Right Knee Immobilizer - Right: Other (comment) Restrictions Weight Bearing Restrictions: Yes RLE Weight Bearing: Partial weight bearing    Pain: pt reports 5/10 pain at start of session, RN enter to provide pain meds with pt having no further c/o pain.     Therapy/Group: Individual Therapy  Barron Schmid 04/16/2021, 12:19 PM

## 2021-04-16 NOTE — Progress Notes (Signed)
PROGRESS NOTE   Subjective/Complaints: Patient seen sitting up in bed this morning.  She states she slept well overnight.  Husband at bedside.  Patient notes improvement in pain after consistently taking pain medications.  She has questions regarding antibiotics at discharge.  ROS: Denies CP, SOB, N/V/D  Objective:   No results found. Recent Labs    04/14/21 0330  WBC 12.1*  HGB 9.9*  HCT 31.4*  PLT 558*    Recent Labs    04/14/21 0330  NA 137  K 3.8  CL 102  CO2 28  GLUCOSE 147*  BUN 6*  CREATININE 0.79  CALCIUM 8.8*     Intake/Output Summary (Last 24 hours) at 04/16/2021 1310 Last data filed at 04/16/2021 0842 Gross per 24 hour  Intake 360 ml  Output --  Net 360 ml         Physical Exam: Vital Signs Blood pressure 101/63, pulse 63, temperature 98.2 F (36.8 C), temperature source Oral, resp. rate 18, weight 82.9 kg, SpO2 95 %. Constitutional: No distress . Vital signs reviewed. HENT: Normocephalic.  Atraumatic. Eyes: EOMI. No discharge. Cardiovascular: No JVD.  RRR. Respiratory: Normal effort.  No stridor.  Bilateral clear to auscultation. GI: Non-distended.  BS +. Skin: Warm and dry.  Intact. Psych: Normal mood.  Normal behavior. Musc: Right knee with edema and tenderness. Neurological: Alert Motor: Bilateral upper extremities: 5/5 proximal distal Left lower extremity: Hip flexion, knee extension 3+/5, ankle dorsiflexion 5/5 Right lower extremity: Hip flexion, knee extension 1/5, ankle dorsiflexion 5/5  Assessment/Plan: 1. Functional deficits which require 3+ hours per day of interdisciplinary therapy in a comprehensive inpatient rehab setting. Physiatrist is providing close team supervision and 24 hour management of active medical problems listed below. Physiatrist and rehab team continue to assess barriers to discharge/monitor patient progress toward functional and medical goals  Care  Tool:  Bathing    Body parts bathed by patient: Right arm, Left arm, Chest, Abdomen, Front perineal area, Right upper leg, Left upper leg, Face, Buttocks, Left lower leg, Right lower leg   Body parts bathed by helper: Right lower leg     Bathing assist Assist Level: Supervision/Verbal cueing     Upper Body Dressing/Undressing Upper body dressing   What is the patient wearing?: Pull over shirt    Upper body assist Assist Level: Set up assist    Lower Body Dressing/Undressing Lower body dressing      What is the patient wearing?: Pants     Lower body assist Assist for lower body dressing: Supervision/Verbal cueing     Toileting Toileting    Toileting assist Assist for toileting: Supervision/Verbal cueing     Transfers Chair/bed transfer  Transfers assist  Chair/bed transfer activity did not occur: Safety/medical concerns (Pain, fatigue)  Chair/bed transfer assist level: Supervision/Verbal cueing (Simultaneous filing. User may not have seen previous data.)     Locomotion Ambulation   Ambulation assist   Ambulation activity did not occur: Safety/medical concerns (Pain, fatigue)  Assist level: Supervision/Verbal cueing (Simultaneous filing. User may not have seen previous data.) Assistive device: Walker-rolling (Simultaneous filing. User may not have seen previous data.) Max distance: 10 (Simultaneous filing. User may not  have seen previous data.)   Walk 10 feet activity   Assist  Walk 10 feet activity did not occur: Safety/medical concerns (Pain, fatigue)  Assist level: Supervision/Verbal cueing Assistive device: Walker-rolling   Walk 50 feet activity   Assist Walk 50 feet with 2 turns activity did not occur: Safety/medical concerns (Pain, fatigue)  Assist level: Supervision/Verbal cueing Assistive device: Walker-rolling    Walk 150 feet activity   Assist Walk 150 feet activity did not occur: Safety/medical concerns (Pain, fatigue)  Assist  level: Contact Guard/Touching assist Assistive device: Walker-rolling    Walk 10 feet on uneven surface  activity   Assist Walk 10 feet on uneven surfaces activity did not occur: Safety/medical concerns (Pain, fatigue)   Assist level: Supervision/Verbal cueing Assistive device: Aeronautical engineer Will patient use wheelchair at discharge?: Yes Type of Wheelchair: Manual Wheelchair activity did not occur: Safety/medical concerns (Pain, fatigue)  Wheelchair assist level: Set up assist, Supervision/Verbal cueing Max wheelchair distance: 180'    Wheelchair 50 feet with 2 turns activity    Assist    Wheelchair 50 feet with 2 turns activity did not occur: Safety/medical concerns (Pain, fatigue)   Assist Level: Supervision/Verbal cueing   Wheelchair 150 feet activity     Assist  Wheelchair 150 feet activity did not occur: Safety/medical concerns (Pain, fatigue)   Assist Level: Supervision/Verbal cueing   Blood pressure 101/63, pulse 63, temperature 98.2 F (36.8 C), temperature source Oral, resp. rate 18, weight 82.9 kg, SpO2 95 %.  Medical Problem List and Plan: 1.  Paraspinal myositis, lumbar facet septic arthritis MRSA bacteremia  Continue CIR 2.  Antithrombotics: -DVT/anticoagulation:  Pharmaceutical: Lovenox             -antiplatelet therapy: N/a 3. Pain Management: Throbbing/jabbing at knee/thigh --Oxycodone prn not effective             --added Oxycontin 8/4 for more consistent relief. -8/6 increased to 15 mg q12 given ongoing pain   - Robaxin--> schedule qid  for muscle aches. 8/7 ordered kpad  8/8- increased oxycontin to 20 mg BID and con't prn Oxy- sounds like she's not asking much for prns- recommended to husband to ask for her when he's here.  Improving with consistent medications. 4. Mood: LCSW to follow for evaluation and support.             -antipsychotic agents: N/A 5. Neuropsych: This patient is capable of making  decisions on her own behalf. 6. Skin/Wound Care: Routine pressure relief measures. Told by ortho staples may be removed after 10d  Plan to DC staples tomorrow.  7. Fluids/Electrolytes/Nutrition: Monitor I/Os.  8. Disseminated MRSA infection: Vancomycin w/July 30th as day #1 for 6-8 weeks followed by extended oral course. CRP, ESR elevated on 8/8 9. T2DM: Hgb A1C- 9.8 and poorly controlled.             --Monitor BS ac/hs and use SSI for tighter control.             --Continue Insulin 9 units meal coverage.   CBG (last 3)  Recent Labs    04/15/21 2058 04/16/21 0557 04/16/21 1144  GLUCAP 152* 138* 156*   Increased semglee to 22 on 8/8 Remains elevated, will not make any changes today, given recent increase. 10. HTN: Monitor BP TID- continues to be labile --continue hydralazine, Norvasc, Losartan and metoprolol Vitals:   04/15/21 2034 04/16/21 0428  BP: 122/66 101/63  Pulse: 71 63  Resp: 18  18  Temp: 98.6 F (37 C) 98.2 F (36.8 C)  SpO2: 96% 95%  Controlled with meds on 8/10 11. Severe constipation:              --continue Senna S - 2 tabs bid -increase miralax to bid as well. 12. Septic Knee s/p debridement: WBAT with KI when up. 71.  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

## 2021-04-16 NOTE — Progress Notes (Signed)
Physical Therapy Session Note  Patient Details  Name: Katie Serrano MRN: [id number] Date of Birth: May 04, 1956  Today's Date: 04/16/2021 PT Individual Time: 1347-1500  PT Individual Time Calculation: 73 min    Short Term Goals: Week 1:  PT Short Term Goal 1 (Week 1): Pt will perform supine <>sit EOB w/min A for improved bed mobility PT Short Term Goal 2 (Week 1): Pt will initiate gait training w/LRAD for improved functional mobility PT Short Term Goal 3 (Week 1): Pt will perform stand pivot w/LRAD and CGA for improved functional mobility  Skilled Therapeutic Interventions/Progress Updates:  Patient seated upright in w/c on entrance to room. Husband present. Patient alert and agreeable to PT session. No new pain complaint during session - has recently received pain medication for continued knee and back pain.   Therapeutic Activity: Transfers: Patient performed STS and SPVT transfers throughout session to/ from various surfaces and heights using RW with supervision. Good advancement of RLE to prevent increased pain with knee flexion from edema. Also supervised husband providing CGA using gait belt to assist wife in toilet transfer. No vc required as husband positioned self well, properly used gait betl. When wife questioned re: husband's technique, she relates that he "did a great job". No vc required throughout session.   Gait Training:  Pt educated in proper use of rollator and demonstrated use of brakes and increased walker mobility with four wheels and free-spin wheels in front. Pt states she has a 3-wheeled rollator that she used in the past (but not since) but is happy to be refamiliarized. Patient ambulated 160' x2 using rollator with CGA initially and improving to supervision. Demonstrated continued increase of downward pressure through BUE with WB to RLE to assist with pain mgmt. On return trip to room following LE mobility, vc/ tc provided for increased R knee flexion, heel-to-toe  progression with improved quality of step progression demo'd by pt.  During session, pt guided through weaving of 5 cones space 5 feet apart suing rollator with continued good control and management of walker.   Neuromuscular Re-ed: NMR facilitated during session with focus on standing balance, RLE strengthening and motor control as well as increased muscle fiber recruitment/ activation. Pt guided in static balance on Airex pad and progressed to self perturbations with changes in straight arm positioning, AP elbow/ shoulder flexion/ ext, and potstirrers all to stimulate activation in ankle support and improving ankle strategy.  Pt also performs minisquats with LUE support. Progressed to standing toe touches on level surface to 6" cone. Requires Min A initially and progresses to CGA by end of efforts. Vc required for increasing hip and knee flexion as well as to perform DF to reach cone height.  NMR performed for improvements in motor control and coordination, balance, sequencing, judgement, and self confidence/ efficacy in performing all aspects of mobility at highest level of independence.   Patient seated  in w/c at end of session with brakes locked, belt alarm set, and all needs within reach. Provided with thermotherapy pad to R knee for improved pain mgmt and pt comfort.      Therapy Documentation Precautions:  Precautions Precautions: Fall Required Braces or Orthoses: Knee Immobilizer - Right Knee Immobilizer - Right: Other (comment) Restrictions Weight Bearing Restrictions: Yes RLE Weight Bearing: Partial weight bearing  Therapy/Group: Individual Therapy  Loel Dubonnet PT, DPT 04/16/2021, 9:02 AM

## 2021-04-17 LAB — GLUCOSE, CAPILLARY
Glucose-Capillary: 145 mg/dL — ABNORMAL HIGH (ref 70–99)
Glucose-Capillary: 139 mg/dL — ABNORMAL HIGH (ref 70–99)
Glucose-Capillary: 78 mg/dL (ref 70–99)
Glucose-Capillary: 197 mg/dL — ABNORMAL HIGH (ref 70–99)

## 2021-04-17 LAB — BASIC METABOLIC PANEL
Anion gap: 7 (ref 5–15)
BUN: 9 mg/dL (ref 8–23)
CO2: 28 mmol/L (ref 22–32)
Calcium: 9.1 mg/dL (ref 8.9–10.3)
Chloride: 99 mmol/L (ref 98–111)
Creatinine, Ser: 1.01 mg/dL — ABNORMAL HIGH (ref 0.44–1.00)
GFR, Estimated: >60 mL/min (ref >60)
Glucose, Bld: 146 mg/dL — ABNORMAL HIGH (ref 70–99)
Potassium: 4.4 mmol/L (ref 3.5–5.1)
Sodium: 134 mmol/L — ABNORMAL LOW (ref 135–145)

## 2021-04-17 MED ORDER — FLUTICASONE PROPIONATE 50 MCG/ACT NA SUSP
2.0000 | Freq: Every day | NASAL | Status: DC
  Administered 2021-04-17 – 2021-04-18 (×2): 2 via NASAL
  Filled 2021-04-17: qty 16

## 2021-04-17 MED ORDER — SORBITOL 70 % SOLN
45.0000 mL | Freq: Every day | Status: DC | PRN
  Administered 2021-04-17: 45 mL via ORAL
  Filled 2021-04-17: qty 60

## 2021-04-17 NOTE — Plan of Care (Signed)
  Problem: RH Bed Mobility Goal: LTG Patient will perform bed mobility with assist (PT) Description: LTG: Patient will perform bed mobility with assistance, with/without cues (PT). Outcome: Not Met (add Reason) Note: Pt requires min A for RLE management when transferring into bed on R side    Problem: RH Balance Goal: LTG Patient will maintain dynamic sitting balance (PT) Description: LTG:  Patient will maintain dynamic sitting balance with assistance during mobility activities (PT) Outcome: Completed/Met Goal: LTG Patient will maintain dynamic standing balance (PT) Description: LTG:  Patient will maintain dynamic standing balance with assistance during mobility activities (PT) Outcome: Completed/Met   Problem: Sit to Stand Goal: LTG:  Patient will perform sit to stand with assistance level (PT) Description: LTG:  Patient will perform sit to stand with assistance level (PT) Outcome: Completed/Met   Problem: RH Bed to Chair Transfers Goal: LTG Patient will perform bed/chair transfers w/assist (PT) Description: LTG: Patient will perform bed to chair transfers with assistance (PT). Outcome: Completed/Met   Problem: RH Car Transfers Goal: LTG Patient will perform car transfers with assist (PT) Description: LTG: Patient will perform car transfers with assistance (PT). Outcome: Completed/Met   Problem: RH Ambulation Goal: LTG Patient will ambulate in controlled environment (PT) Description: LTG: Patient will ambulate in a controlled environment, # of feet with assistance (PT). Outcome: Completed/Met Goal: LTG Patient will ambulate in home environment (PT) Description: LTG: Patient will ambulate in home environment, # of feet with assistance (PT). Outcome: Completed/Met   Problem: RH Wheelchair Mobility Goal: LTG Patient will propel w/c in controlled environment (PT) Description: LTG: Patient will propel wheelchair in controlled environment, # of feet with assist (PT) Outcome:  Completed/Met Goal: LTG Patient will propel w/c in home environment (PT) Description: LTG: Patient will propel wheelchair in home environment, # of feet with assistance (PT). Outcome: Completed/Met   Problem: RH Stairs Goal: LTG Patient will ambulate up and down stairs w/assist (PT) Description: LTG: Patient will ambulate up and down # of stairs with assistance (PT) Outcome: Completed/Met

## 2021-04-17 NOTE — Progress Notes (Signed)
Physical Therapy Discharge Summary  Patient Details  Name: Katie Serrano MRN: [id number] Date of Birth: [birth year]  Today's Date: 04/17/2021 PT Individual Time: 6283-6629; 4765-4650 PT Individual Time Calculation (min): 48 min and 70 min   Patient has met 10 of 11 long term goals due to improved activity tolerance, improved balance, improved postural control, increased strength, decreased pain, and improved coordination.  Patient to discharge at  small distance ambulatory/WC  level Supervision.   Patient's care partner is independent to provide the necessary physical assistance at discharge.  Reasons goals not met: Pt requires min A for RLE management for bed mobility when transferring to the R side.   Recommendation:  Patient will benefit from ongoing skilled PT services in home health setting to continue to advance safe functional mobility, address ongoing impairments in dynamic balance, RLE strength, lateral weight shifting, endurance, and minimize fall risk.  Equipment: WC, RW  Reasons for discharge: treatment goals met and discharge from hospital  Patient/family agrees with progress made and goals achieved: Yes  PT Treatment  Session 1 Pt received sitting in WC in room w/husband present. Reported 4/10 pain in RLE, had received pain meds prior to session. Emphasis of session on bed mobility and preparing for DC home. Pt performed sit <> stand and stand pivot from WC to EOB w/RW and supervision. Sit <>supine to R side w/min A for RLE management. Pt performed rolling L & R mod I w/use of bedrails. Supine <>sit EOB w/supervision. Pt and husband ambulated to and from bathroom w/RW and supervision from therapist. Husband, [name], is cleared to transport pt inside of room. Pt was left sitting in WC in room w/all questions answered and all needs in reach.   Session 2 Pt received sitting in WC in room w/husband. Denied pain in RLE and had received pain meds prior to session. Emphasis of  session on transfers, safety w/AD and prepare for DC home. Pt transported in [facility] w/ total A throughout session in [place] 2/2 active PICC line and time management. Sit <> stand WC<>RW w/supervision throughout session and completed car transfer w/supervision. Pt transported to main gym and ascended/descended 12 steps w/BUE support on rails and step-to pattern w/CGA. Pt ambulated >150' w/RW and supervision, noted improved step clearance, step-through pattern, improved weight shifting and weight acceptance on RLE. After short sitting rest break, pt ambulated 100' back to room w/RW and supervision. Pt was left sitting in chair in room w/husband and all needs in reach.   PT Discharge Precautions/Restrictions Precautions Precautions: None Precaution Comments: high fall Required Braces or Orthoses: Knee Immobilizer - Right Knee Immobilizer - Right: Other (comment) (PRN for pain) Restrictions Weight Bearing Restrictions: No RLE Weight Bearing: Weight bearing as tolerated Other Position/Activity Restrictions: RLE WBAT Cognition Overall Cognitive Status: Within Functional Limits for tasks assessed Orientation Level: Oriented X4 Safety/Judgment: Appears intact Sensation Sensation Light Touch: Appears Intact Coordination Gross Motor Movements are Fluid and Coordinated: No Coordination and Movement Description: Slow, limited by swelling and pain of RLE Finger Nose Finger Test: [facility] Heel Shin Test: LLE WFL, lacking >50% on RLE Motor  Motor Motor: Other (comment) Motor - Skilled Clinical Observations: Significant RLE weakness 2/2 pain and global deconditioning, mild fear-avoidance behavior  Mobility Bed Mobility Bed Mobility: Rolling Right;Rolling Left;Supine to Sit;Sitting - Scoot to [employer] of Bed;Sit to Supine;Scooting to [facility] Rolling Right: Independent with assistive device (Bedrails) Rolling Left: Independent with assistive device (Bedrails) Supine to Sit: Independent with assistive device  (Bedrails) Sitting - Scoot to  Edge of Bed: Independent with assistive device (Bedrails) Sit to Supine: Minimal Assistance - Patient > 75% (RLE management, bedrails) Scooting to HOB: Independent with assistive device (Bedrails) Transfers Transfers: Sit to Stand;Stand to Sit;Stand Pivot Transfers Sit to Stand: Supervision/Verbal cueing (RW) Stand to Sit: Supervision/Verbal cueing (RW) Stand Pivot Transfers: Supervision/Verbal cueing (RW) Stand Pivot Transfer Details: Manual facilitation for weight bearing Transfer (Assistive device): Rolling walker Locomotion  Gait Ambulation: Yes Gait Assistance: Supervision/Verbal cueing Gait Distance (Feet): 150 Feet Assistive device: Rolling walker Gait Assistance Details: Tactile cues for weight shifting;Verbal cues for safe use of DME/AE Gait Gait: Yes Gait Pattern: Impaired Gait Pattern: Decreased step length - left;Decreased stance time - right;Decreased weight shift to right;Poor foot clearance - left;Poor foot clearance - right Gait velocity: Decreased Stairs / Additional Locomotion Stairs: Yes Stairs Assistance: Contact Guard/Touching assist Stair Management Technique: Two rails Number of Stairs: 12 Height of Stairs: 6 Ramp: Contact Guard/touching assist Curb: Nurse, mental health Mobility: Yes Wheelchair Assistance: Set up Lexicographer: Both upper extremities Wheelchair Parts Management: Supervision/cueing Distance: 180'  Trunk/Postural Assessment  Cervical Assessment Cervical Assessment: Exceptions to [facility] (Forward head) Thoracic Assessment Thoracic Assessment: Exceptions to [facility] (Rounded shoulders) Lumbar Assessment Lumbar Assessment: Exceptions to [facility] (Posterior pelvic tilt) Postural Control Postural Control: Deficits on evaluation Trunk Control: Slight lean to L, requires unilateral support Protective Responses: Impaired 2/2 RLE pain and swelling, mild fear-avoidance   Balance Balance Balance Assessed: Yes Static Sitting Balance Static Sitting - Balance Support: Feet supported;Right upper extremity supported Static Sitting - Level of Assistance: 7: Independent Dynamic Sitting Balance Dynamic Sitting - Balance Support: Feet supported Dynamic Sitting - Level of Assistance: 5: Stand by assistance Static Standing Balance Static Standing - Level of Assistance: 6: Modified independent (Device/Increase time) (RW) Dynamic Standing Balance Dynamic Standing - Balance Support: Bilateral upper extremity supported Dynamic Standing - Level of Assistance: 5: Stand by assistance (RW) Dynamic Standing - Balance Activities: Lateral lean/weight shifting;Forward lean/weight shifting Extremity Assessment  RLE Assessment RLE Assessment: Exceptions to [facility] RLE Strength Right Hip Flexion: 2/5 Right Hip ABduction: 4-/5 Right Hip ADduction: 4-/5 Right Knee Flexion: 3-/5 Right Knee Extension: 2+/5 Right Ankle Dorsiflexion: 4+/5 Right Ankle Plantar Flexion: 4+/5 LLE Assessment LLE Assessment: Exceptions to [facility] LLE Strength Left Hip Flexion: 4+/5 Left Hip ABduction: 4+/5 Left Hip ADduction: 4+/5 Left Knee Extension: 4+/5 Left Ankle Dorsiflexion: 4+/5 Left Ankle Plantar Flexion: 4+/5   Nancylee Gaines E Maurine Mowbray, PT, DPT 04/17/2021, 11:59 AM

## 2021-04-17 NOTE — Progress Notes (Addendum)
PROGRESS NOTE   Subjective/Complaints:  C/O nasal congestion- "can't breathe".  Clogged up- feels like it's allergic- has taken Afrin for 2 days- today is 3rd day.   Back pain still main issue otherwise.     ROS:  Pt denies SOB, abd pain, CP, N/V/C/D, and vision changes   Objective:   No results found. No results for input(s): WBC, HGB, HCT, PLT in the last 72 hours. Recent Labs    04/17/21 0251  NA 134*  K 4.4  CL 99  CO2 28  GLUCOSE 146*  BUN 9  CREATININE 1.01*  CALCIUM 9.1    Intake/Output Summary (Last 24 hours) at 04/17/2021 0923 Last data filed at 04/17/2021 0718 Gross per 24 hour  Intake 600 ml  Output --  Net 600 ml        Physical Exam: Vital Signs Blood pressure 135/66, pulse 72, temperature 98.4 F (36.9 C), temperature source Oral, resp. rate 20, weight 82.9 kg, SpO2 96 %.   General: awake, alert, appropriate, sitting up in w/c; husband joined [place]; NAD HENT: conjugate gaze; oropharynx moist; however sounds nasal- TTP maxillary sinuses B/L CV: regular rate; no JVD Pulmonary: CTA B/L; no W/R/R- good air movement- clear GI: soft, NT, ND, (+)BS Psychiatric: appropriate Neurological: alert  Skin: Warm and dry.  Intact. Musc: Right knee with edema and tenderness. Less edematous Neurological: Alert Motor: Bilateral upper extremities: 5/5 proximal distal Left lower extremity: Hip flexion, knee extension 3+/5, ankle dorsiflexion 5/5 Right lower extremity: Hip flexion, knee extension 1/5, ankle dorsiflexion 5/5  Assessment/Plan: 1. Functional deficits which require 3+ hours per day of interdisciplinary therapy in a comprehensive inpatient rehab setting. Physiatrist is providing close team supervision and 24 hour management of active medical problems listed below. Physiatrist and rehab team continue to assess barriers to discharge/monitor patient progress toward functional and medical  goals  Care Tool:  Bathing    Body parts bathed by patient: Right arm, Left arm, Chest, Abdomen, Front perineal area, Right upper leg, Left upper leg, Face, Buttocks, Left lower leg, Right lower leg   Body parts bathed by helper: Right lower leg     Bathing assist Assist Level: Supervision/Verbal cueing     Upper Body Dressing/Undressing Upper body dressing   What is the patient wearing?: Pull over shirt    Upper body assist Assist Level: Set up assist    Lower Body Dressing/Undressing Lower body dressing      What is the patient wearing?: Pants     Lower body assist Assist for lower body dressing: Supervision/Verbal cueing     Toileting Toileting    Toileting assist Assist for toileting: Supervision/Verbal cueing     Transfers Chair/bed transfer  Transfers assist  Chair/bed transfer activity did not occur: Safety/medical concerns (Pain, fatigue)  Chair/bed transfer assist level: Supervision/Verbal cueing (Simultaneous filing. User may not have seen previous data.)     Locomotion Ambulation   Ambulation assist   Ambulation activity did not occur: Safety/medical concerns (Pain, fatigue)  Assist level: Supervision/Verbal cueing (Simultaneous filing. User may not have seen previous data.) Assistive device: Walker-rolling (Simultaneous filing. User may not have seen previous data.) Max distance: 10 (Simultaneous filing.  User may not have seen previous data.)   Walk 10 feet activity   Assist  Walk 10 feet activity did not occur: Safety/medical concerns (Pain, fatigue)  Assist level: Supervision/Verbal cueing Assistive device: Walker-rolling   Walk 50 feet activity   Assist Walk 50 feet with 2 turns activity did not occur: Safety/medical concerns (Pain, fatigue)  Assist level: Supervision/Verbal cueing Assistive device: Walker-rolling    Walk 150 feet activity   Assist Walk 150 feet activity did not occur: Safety/medical concerns (Pain,  fatigue)  Assist level: Contact Guard/Touching assist Assistive device: Walker-rolling    Walk 10 feet on uneven surface  activity   Assist Walk 10 feet on uneven surfaces activity did not occur: Safety/medical concerns (Pain, fatigue)   Assist level: Supervision/Verbal cueing Assistive device: Aeronautical engineer Will patient use wheelchair at discharge?: Yes Type of Wheelchair: Manual Wheelchair activity did not occur: Safety/medical concerns (Pain, fatigue)  Wheelchair assist level: Set up assist, Supervision/Verbal cueing Max wheelchair distance: 180'    Wheelchair 50 feet with 2 turns activity    Assist    Wheelchair 50 feet with 2 turns activity did not occur: Safety/medical concerns (Pain, fatigue)   Assist Level: Supervision/Verbal cueing   Wheelchair 150 feet activity     Assist  Wheelchair 150 feet activity did not occur: Safety/medical concerns (Pain, fatigue)   Assist Level: Supervision/Verbal cueing   Blood pressure 135/66, pulse 72, temperature 98.4 F (36.9 C), temperature source Oral, resp. rate 20, weight 82.9 kg, SpO2 96 %.  Medical Problem List and Plan: 1.  Paraspinal myositis, lumbar facet septic arthritis MRSA bacteremia  Con't CIR PT and OT- 2.  Antithrombotics: -DVT/anticoagulation:  Pharmaceutical: Lovenox             -antiplatelet therapy: N/a 3. Pain Management: Throbbing/jabbing at knee/thigh --Oxycodone prn not effective             --added Oxycontin 8/4 for more consistent relief. -8/6 increased to 15 mg q12 given ongoing pain   - Robaxin--> schedule qid  for muscle aches. 8/7 ordered kpad  8/8- increased oxycontin to 20 mg BID and con't prn Oxy- sounds like she's not asking much for prns- recommended to husband to ask for her when he's here.  Improving with consistent medications. 8/11- asked pt to ask PCP if they can write for her pain meds at home- since not long term- asked her to call PCP 4.  Mood: LCSW to follow for evaluation and support.             -antipsychotic agents: N/A 5. Neuropsych: This patient is capable of making decisions on her own behalf. 6. Skin/Wound Care: Routine pressure relief measures. Told by ortho staples may be removed after 10d  Plan to DC staples tomorrow.   8/11- d/c staples 7. Fluids/Electrolytes/Nutrition: Monitor I/Os.  8. Disseminated MRSA infection: Vancomycin w/July 30th as day #1 for 6-8 weeks followed by extended oral course. CRP, ESR elevated on 8/8 9. T2DM: Hgb A1C- 9.8 and poorly controlled.             --Monitor BS ac/hs and use SSI for tighter control.             --Continue Insulin 9 units meal coverage.   CBG (last 3)  Recent Labs    04/16/21 1710 04/16/21 2104 04/17/21 0605  GLUCAP 207* 195* 145*  Increased semglee to 22 on 8/8 Remains elevated, will not make any changes today, given recent  increase. 8/11- BG's- still elevated- supposed to go home tomorrow-wasn't on insulin- will d/w PA to get this figured out- will need to go home on insulin- will likely need monitor, etc.  10. HTN: Monitor BP TID- continues to be labile --continue hydralazine, Norvasc, Losartan and metoprolol Vitals:   04/16/21 2102 04/17/21 0350  BP: 127/65 135/66  Pulse: 81 72  Resp: 18 20  Temp: 98.2 F (36.8 C) 98.4 F (36.9 C)  SpO2: 98% 96%  Controlled with meds on 8/10 11. Severe constipation:              --continue Senna S - 2 tabs bid -increase miralax to bid as well. 12. Septic Knee s/p debridement: WBAT with KI when up. 13.  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  14. Nasal congestion  8/11- added Flonase 2 sprays daily and today needs to be last day of Afrin- explained to pt.     LOS: 7 days A FACE TO FACE EVALUATION WAS PERFORMED  Tyaire Odem 04/17/2021, 9:23 AM

## 2021-04-17 NOTE — Progress Notes (Addendum)
Occupational Therapy Session Note  Patient Details  Name: Katie Serrano MRN: [id number] Date of Birth: April 09, 1956  Today's Date: 04/17/2021 OT Individual Time: 0725-0826 OT Individual Time Calculation (min): 61 min    Short Term Goals: Week 1:  OT Short Term Goal 1 (Week 1): patient will complete bed mobility with min A OT Short Term Goal 2 (Week 1): patient will complete SPT with RW CGA OT Short Term Goal 3 (Week 1): patient will complete UB adl with set up OT Short Term Goal 4 (Week 1): patient will complete LB adl with min/mod A OT Short Term Goal 5 (Week 1): patient will complete toileting with CGA  Skilled Therapeutic Interventions/Progress Updates:    Pt greeted at time of session sitting up in wheelchair with K pad on L hip, stating she had a rough night and expressing concerns with night staff, recommended speak with charge nurse if desired and emotional support provided. Pt aware of grad day and wanting to bathe, performed UB/LB bathing sink level with Supervision overall (later provided LHS for use at home) and demonstrated use. UB dress set up and LB dress Supervision able to FWD weight shift to thread and donned sit > stand level over hips. Provided pt with HEP as well for BUE strengthening with both therapy bands level 2 and dumbbells for home use. Husband entering at this time and requesting to be checked off on toileting, husband [name] demonstrating good use of gait belt, locking brakes, etc and ambulating with pt providing CS/CGA to/from bathroom as pt toileted with Supervision. Back in wheelchair all needs met, communicated with NT that husband checked off.    Therapy Documentation Precautions:  Precautions Precautions: Fall Required Braces or Orthoses: Knee Immobilizer - Right Knee Immobilizer - Right: Other (comment) Restrictions Weight Bearing Restrictions: Yes RLE Weight Bearing: Partial weight bearing   Therapy/Group: Individual Therapy  Viona Gilmore 04/17/2021, 7:12 AM

## 2021-04-17 NOTE — Progress Notes (Signed)
Occupational Therapy Discharge Summary  Patient Details  Name: Katie Serrano MRN: [id number] Date of Birth: 1956-07-17   Patient has met 7 of 9 long term goals due to improved activity tolerance, improved balance, postural control, ability to compensate for deficits, improved awareness, and improved coordination.  Patient to discharge at overall Supervision level.  Patient's care partner is independent to provide the necessary physical assistance at discharge.  Pt is overall Supervision with sink level bathing, dressing, and toileting tasks. Pt is limited at times d/t pain/discomfort in R knee and does use RW for all mobility and transfers. Pt issued LHS for reaching R foot and plans to bathe in kitchen at home to fit wheelchair/chair under large table. Pt's husband has been trained on providing Supervision and both feel ready for the pt to DC home.   Reasons goals not met: Pt is Supervision for all standing/mobility tasks.   Recommendation:  Patient will benefit from ongoing skilled OT services in home health setting to continue to advance functional skills in the area of BADL and Reduce care partner burden.  Equipment: BSC  Reasons for discharge: treatment goals met and discharge from hospital  Patient/family agrees with progress made and goals achieved: Yes  OT Discharge Precautions/Restrictions  Precautions Precautions: None Precaution Comments: high fall Required Braces or Orthoses: Knee Immobilizer - Right Knee Immobilizer - Right: Other (comment) (PRN) Restrictions Weight Bearing Restrictions: No RLE Weight Bearing: Weight bearing as tolerated Other Position/Activity Restrictions: RLE WBAT Vital Signs Therapy Vitals Temp: 98.1 F (36.7 C) Temp Source: Oral Pulse Rate: (!) 51 Resp: 15 BP: (!) 90/51 Patient Position (if appropriate): Sitting Oxygen Therapy SpO2: 99 % O2 Device: Room Air Pain Pain Assessment Pain Scale: 0-10 Pain Score: 4  ADL ADL Eating:  Independent (per staff) Where Assessed-Eating: Bed level Grooming: Independent (per staff) Where Assessed-Grooming: Bed level Upper Body Bathing: Setup Where Assessed-Upper Body Bathing: Bed level Lower Body Bathing: Supervision/safety Where Assessed-Lower Body Bathing: Sitting at sink, Standing at sink Upper Body Dressing: Setup Where Assessed-Upper Body Dressing: Edge of bed Lower Body Dressing: Supervision/safety Where Assessed-Lower Body Dressing: Bed level Toileting: Supervision/safety Where Assessed-Toileting: Bedside Commode Toilet Transfer: Close supervision Toilet Transfer Method: Counselling psychologist: Bedside commode Vision Baseline Vision/History: Wears glasses Perception  Perception: Within Functional Limits Praxis Praxis: Intact Cognition Overall Cognitive Status: Within Functional Limits for tasks assessed Arousal/Alertness: Awake/alert Memory: Appears intact Awareness: Appears intact Problem Solving: Appears intact Safety/Judgment: Appears intact Motor  Motor Motor - Skilled Clinical Observations: Significant RLE weakness 2/2 pain and global deconditioning, mild fear-avoidance behavior Mobility  Transfers Sit to Stand: Supervision/Verbal cueing Stand to Sit: Supervision/Verbal cueing  Trunk/Postural Assessment  Cervical Assessment Cervical Assessment: Exceptions to [facility] Thoracic Assessment Thoracic Assessment: Exceptions to [facility] Lumbar Assessment Lumbar Assessment: Exceptions to [facility] Postural Control Postural Control: Deficits on evaluation Trunk Control: Slight lean to L, requires unilateral support Protective Responses: Impaired 2/2 RLE pain and swelling, mild fear-avoidance  Balance Balance Balance Assessed: Yes Static Sitting Balance Static Sitting - Balance Support: Feet supported;Right upper extremity supported Static Sitting - Level of Assistance: 7: Independent Dynamic Sitting Balance Dynamic Sitting - Balance Support: Feet  supported Dynamic Sitting - Level of Assistance: 7: Independent Static Standing Balance Static Standing - Level of Assistance: 6: Modified independent (Device/Increase time) Dynamic Standing Balance Dynamic Standing - Balance Support: Bilateral upper extremity supported Dynamic Standing - Level of Assistance: 5: Stand by assistance Extremity/Trunk Assessment RUE Assessment RUE Assessment: Within Functional Limits LUE Assessment LUE Assessment: Within  Functional Limits   Viona Gilmore 04/17/2021, 4:59 PM

## 2021-04-18 ENCOUNTER — Other Ambulatory Visit (HOSPITAL_COMMUNITY): Payer: Self-pay

## 2021-04-18 DIAGNOSIS — D62 Acute posthemorrhagic anemia: Secondary | ICD-10-CM

## 2021-04-18 DIAGNOSIS — D72829 Elevated white blood cell count, unspecified: Secondary | ICD-10-CM

## 2021-04-18 LAB — BASIC METABOLIC PANEL
Anion gap: 10 (ref 5–15)
BUN: 8 mg/dL (ref 8–23)
CO2: 26 mmol/L (ref 22–32)
Calcium: 9.3 mg/dL (ref 8.9–10.3)
Chloride: 96 mmol/L — ABNORMAL LOW (ref 98–111)
Creatinine, Ser: 0.94 mg/dL (ref 0.44–1.00)
GFR, Estimated: >60 mL/min (ref >60)
Glucose, Bld: 154 mg/dL — ABNORMAL HIGH (ref 70–99)
Potassium: 4.3 mmol/L (ref 3.5–5.1)
Sodium: 132 mmol/L — ABNORMAL LOW (ref 135–145)

## 2021-04-18 LAB — GLUCOSE, CAPILLARY
Glucose-Capillary: 162 mg/dL — ABNORMAL HIGH (ref 70–99)
Glucose-Capillary: 81 mg/dL (ref 70–99)

## 2021-04-18 MED ORDER — BASAGLAR KWIKPEN 100 UNIT/ML ~~LOC~~ SOPN: 22.0000 [IU] | PEN_INJECTOR | Freq: Every day | SUBCUTANEOUS | 1 refills | Status: DC

## 2021-04-18 MED ORDER — MELATONIN 5 MG PO TABS
5.0000 mg | ORAL_TABLET | Freq: Every day | ORAL | 0 refills | Status: DC
Start: 2021-04-18 — End: 2021-05-02
  Filled 2021-04-18: qty 30, 30d supply, fill #0

## 2021-04-18 MED ORDER — ASPIRIN 81 MG PO CHEW: 81.0000 mg | CHEWABLE_TABLET | Freq: Every day | ORAL | 0 refills | Status: DC

## 2021-04-18 MED ORDER — OXYCODONE HCL ER 20 MG PO T12A: 20.0000 mg | EXTENDED_RELEASE_TABLET | Freq: Two times a day (BID) | ORAL | 0 refills | Status: DC

## 2021-04-18 MED ORDER — XTAMPZA ER 18 MG PO C12A: 18.0000 mg | EXTENDED_RELEASE_CAPSULE | Freq: Two times a day (BID) | ORAL | 0 refills | Status: DC

## 2021-04-18 MED ORDER — AMLODIPINE BESYLATE 10 MG PO TABS
10.0000 mg | ORAL_TABLET | Freq: Every day | ORAL | 0 refills | Status: DC
  Filled 2021-04-18: qty 30, 30d supply, fill #0

## 2021-04-18 MED ORDER — INSULIN GLARGINE 100 UNIT/ML SOLOSTAR PEN
22.0000 [IU] | PEN_INJECTOR | Freq: Every day | SUBCUTANEOUS | 0 refills | Status: DC
  Filled 2021-04-18: qty 9, 40d supply, fill #0

## 2021-04-18 MED ORDER — INSULIN LISPRO (1 UNIT DIAL) 100 UNIT/ML (KWIKPEN): 9.0000 [IU] | PEN_INJECTOR | Freq: Three times a day (TID) | SUBCUTANEOUS | 11 refills | Status: DC

## 2021-04-18 MED ORDER — DOCUSATE SODIUM 100 MG PO CAPS
100.0000 mg | ORAL_CAPSULE | Freq: Two times a day (BID) | ORAL | 0 refills | Status: DC
  Filled 2021-04-18: qty 60, 30d supply, fill #0

## 2021-04-18 MED ORDER — SENNA 8.6 MG PO TABS
2.0000 | ORAL_TABLET | Freq: Two times a day (BID) | ORAL | 0 refills | Status: DC
  Filled 2021-04-18: qty 120, 30d supply, fill #0

## 2021-04-18 MED ORDER — OXYCODONE HCL 5 MG PO TABS
5.0000 mg | ORAL_TABLET | Freq: Two times a day (BID) | ORAL | 0 refills | Status: DC | PRN
  Filled 2021-04-18: qty 14, 7d supply, fill #0

## 2021-04-18 MED ORDER — BASAGLAR KWIKPEN 100 UNIT/ML ~~LOC~~ SOPN: 22.0000 [IU] | PEN_INJECTOR | Freq: Every day | SUBCUTANEOUS | 0 refills | Status: DC

## 2021-04-18 MED ORDER — PHENOL 1.4 % MT LIQD
1.0000 | OROMUCOSAL | 0 refills | Status: DC | PRN
  Filled 2021-04-18: qty 118, fill #0

## 2021-04-18 MED ORDER — INSULIN GLARGINE-YFGN 100 UNIT/ML ~~LOC~~ SOPN
22.0000 [IU] | PEN_INJECTOR | Freq: Every day | SUBCUTANEOUS | 11 refills | Status: DC
  Filled 2021-04-18: qty 10, 45d supply, fill #0

## 2021-04-18 MED ORDER — ACETAMINOPHEN 325 MG PO TABS: 325.0000 mg | ORAL_TABLET | ORAL | Status: DC | PRN

## 2021-04-18 MED ORDER — POLYETHYLENE GLYCOL 3350 17 GM/SCOOP PO POWD: 17.0000 g | Freq: Two times a day (BID) | ORAL | 0 refills | Status: DC

## 2021-04-18 MED ORDER — INSULIN LISPRO 100 UNIT/ML ~~LOC~~ SOLN: 9.0000 [IU] | Freq: Three times a day (TID) | SUBCUTANEOUS | 11 refills | Status: DC

## 2021-04-18 MED ORDER — INSULIN PEN NEEDLE 32G X 4 MM MISC
1.0000 | Freq: Four times a day (QID) | 0 refills | Status: DC
Start: 2021-04-18 — End: 2021-04-18
  Filled 2021-04-18: qty 200, fill #0

## 2021-04-18 MED ORDER — SENNA 8.6 MG PO TABS: 2.0000 | ORAL_TABLET | Freq: Two times a day (BID) | ORAL | 0 refills | Status: DC

## 2021-04-18 MED ORDER — MORPHINE SULFATE ER 30 MG PO TBCR: 30.0000 mg | EXTENDED_RELEASE_TABLET | Freq: Two times a day (BID) | ORAL | 0 refills | Status: DC

## 2021-04-18 MED ORDER — METHOCARBAMOL 500 MG PO TABS
500.0000 mg | ORAL_TABLET | Freq: Four times a day (QID) | ORAL | 0 refills | Status: DC
  Filled 2021-04-18: qty 120, 30d supply, fill #0

## 2021-04-18 MED ORDER — POLYETHYLENE GLYCOL 3350 17 GM/SCOOP PO POWD
17.0000 g | Freq: Two times a day (BID) | ORAL | 0 refills | Status: DC
Start: 2021-04-18 — End: 2021-04-18
  Filled 2021-04-18: qty 60, 30d supply, fill #0

## 2021-04-18 MED ORDER — HEPARIN SOD (PORK) LOCK FLUSH 100 UNIT/ML IV SOLN
250.0000 [IU] | INTRAVENOUS | Status: AC | PRN
Start: 2021-04-18 — End: 2021-04-18
  Administered 2021-04-18: 250 [IU]
  Filled 2021-04-18: qty 2.5

## 2021-04-18 MED ORDER — LOSARTAN POTASSIUM 50 MG PO TABS
50.0000 mg | ORAL_TABLET | Freq: Every day | ORAL | 0 refills | Status: AC
  Filled 2021-04-18: qty 30, 30d supply, fill #0

## 2021-04-18 MED ORDER — INSULIN PEN NEEDLE 32G X 4 MM MISC: 1.0000 "application " | Freq: Four times a day (QID) | 0 refills | Status: DC

## 2021-04-18 MED ORDER — MENTHOL 3 MG MT LOZG: 1.0000 | LOZENGE | OROMUCOSAL | 12 refills | Status: DC | PRN

## 2021-04-18 MED ORDER — FLUTICASONE PROPIONATE 50 MCG/ACT NA SUSP
2.0000 | Freq: Every day | NASAL | 0 refills | Status: DC
  Filled 2021-04-18: qty 11.1, fill #0

## 2021-04-18 MED ORDER — ASPIRIN 81 MG PO CHEW: 81.0000 mg | CHEWABLE_TABLET | Freq: Two times a day (BID) | ORAL | 0 refills | Status: AC

## 2021-04-18 MED ORDER — OXYCODONE HCL 5 MG PO TABS: 5.0000 mg | ORAL_TABLET | Freq: Two times a day (BID) | ORAL | 0 refills | Status: DC | PRN

## 2021-04-18 MED ORDER — OXYCODONE HCL ER 20 MG PO T12A
20.0000 mg | EXTENDED_RELEASE_TABLET | Freq: Two times a day (BID) | ORAL | 0 refills | Status: DC
  Filled 2021-04-18: qty 14, 7d supply, fill #0

## 2021-04-18 MED ORDER — INSULIN GLARGINE-YFGN 100 UNIT/ML ~~LOC~~ SOPN: 22.0000 [IU] | PEN_INJECTOR | Freq: Every day | SUBCUTANEOUS | 11 refills | Status: DC

## 2021-04-18 MED ORDER — INSULIN ASPART 100 UNIT/ML FLEXPEN
9.0000 [IU] | PEN_INJECTOR | Freq: Three times a day (TID) | SUBCUTANEOUS | 0 refills | Status: DC
  Filled 2021-04-18: qty 15, fill #0

## 2021-04-18 MED ORDER — METHOCARBAMOL 500 MG PO TABS: 500.0000 mg | ORAL_TABLET | Freq: Four times a day (QID) | ORAL | 0 refills | Status: DC

## 2021-04-18 MED ORDER — METOPROLOL TARTRATE 50 MG PO TABS
50.0000 mg | ORAL_TABLET | Freq: Two times a day (BID) | ORAL | 0 refills | Status: DC
  Filled 2021-04-18: qty 60, 30d supply, fill #0

## 2021-04-18 MED ORDER — INSULIN ASPART 100 UNIT/ML FLEXPEN: 9.0000 [IU] | PEN_INJECTOR | Freq: Three times a day (TID) | SUBCUTANEOUS | 0 refills | Status: DC

## 2021-04-18 NOTE — Progress Notes (Signed)
Patient discharged home via wheelchair on a private car with husband. Discharge instructions explained by provider. Patient educated on use of insulin pen at home with copy of instructions provided. All personal belongings sent home with patient.

## 2021-04-18 NOTE — Discharge Summary (Signed)
Physician Discharge Summary  Patient ID: Katie Serrano MRN: [id number] DOB/AGE: 1956/02/03 65 y.o.  Admit date: 04/10/2021 Discharge date: 04/18/2021  Discharge Diagnoses:  Principal Problem:   Myositis Active Problems:   Essential hypertension   Hyponatremia   MRSA bacteremia   Septic embolism (HCC)   Acute pain of right knee   Uncontrolled type 2 diabetes mellitus with hyperglycemia (HCC)   Postoperative pain   Acute blood loss anemia   Leukocytosis (leucocytosis)   Discharged Condition: good  Significant Diagnostic Studies: N/a   Labs:  Basic Metabolic Panel: Recent Labs  Lab 04/14/21 0330 04/17/21 0251 04/18/21 0527  NA 137 134* 132*  K 3.8 4.4 4.3  CL 102 99 96*  CO2 28 28 26   GLUCOSE 147* 146* 154*  BUN 6* 9 8  CREATININE 0.79 1.01* 0.94  CALCIUM 8.8* 9.1 9.3    CBC: CBC Latest Ref Rng & Units 04/14/2021 04/11/2021 04/10/2021  WBC 4.0 - 10.5 K/uL 12.1(H) 16.6(H) 17.9(H)  Hemoglobin 12.0 - 15.0 g/dL 9.9(L) 10.8(L) 11.3(L)  Hematocrit 36.0 - 46.0 % 31.4(L) 33.1(L) 34.7(L)  Platelets 150 - 400 K/uL 558(H) 535(H) 543(H)     CBG: Recent Labs  Lab 04/17/21 1106 04/17/21 1611 04/17/21 2137 04/18/21 0634 04/18/21 1208  GLUCAP 139* 78 197* 162* 81    Brief HPI:   Katie Serrano is a 65 y.o. female with history of HTN, COPD, T2DM, CAD who was admitted on 03/30/21 with weakness, fevers and left flank pain due to sepsis from MRSA UTI with bacteremia. 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 Medical treatment with antibiotics without need for surgical intervention  recommended by Dr. Reatha Armour. TEE was negative for endocarditis but she developed throbbing persistent HA over right eye on 07/29. MRI brain done showing bicerebral infracts with diffuse leptomeningeal enhancement with question of meningitis.   Dr. Earnestine Leys consulted for input felt that LP not needed and  recommended lengthy  treatment with IV antibiotics due to concerns of septic emboli to the brain. She developed right knee swelling with pain due to MRSA and underwent I & D of knee by Dr. Lyla Glassing on 07/30. Electrolyte abnormalities were resolving and WBC was slowly trending down. Dr.Snider with ID recommended 8 week course of Vanc for disseminated MRSA infection. Patient continued to be limited by knee pain with weakness, generalized malaise as well a decreased activity tolerance. CIR was recommended due to functional decline.    Hospital Course: Katie Serrano was admitted to rehab 04/10/2021 for inpatient therapies to consist of PT  and OT at least three hours five days a week. Past admission physiatrist, therapy team and rehab RN have worked together to provide customized collaborative inpatient rehab. She continued to be limited by pain therefore Oxycontin was added and increased to 15 mg bid with oxycodone prn. Robaxin was scheduled qid to help with muscle spasms. Oxycontin was changed to [name] at discharge due to formulary restriction. Constipation has been managed with use of Senna bid. Her blood pressures were monitored on TID basis and has been stable on current regimen. Serial check of CBC showed that leucocytosis is resolving with  downward trend in H/H without signs of bleeding. Recommended repeat CBC in 1-2 weeks to monitor H/H for stability.    Lovenox was used for DVT prophylaxis during her stay and was transitioned to ASA bid at discharge.  Her diabetes has been monitored with  ac/hs CBG checks and SSI was use prn for tighter BS control. Semglee was titrated up to 22 units for tighter control with meal coverage tid. She has been tolerating IV vancomycin without SE and is to continue this with end date 09/22. Follow up check of BMET showed mild hyponatremia with stable renal function. Staples  from right knee incision were removed on 08/11 and incision is iC/D/I and healing well without s/s  of infection. 1+ edema noted from right knee to toes and patient advised to wear her KHT support stockings as well as ace wrap from mid thigh to knee.    Rehab course: During patient's stay in rehab weekly team conferences were held to monitor patient's progress, set goals and discuss barriers to discharge. At admission, patient required mod assist with basic self care tasks and min assist with mobility . She  has had improvement in activity tolerance, balance, postural control as well as ability to compensate for deficits. She is able to complete ADL task with supervision at sink level and with use AE.  She requires supervision for transfers and to ambulate 150' with use of RW. Family education was completed with husband regarding all aspects of safety and care.   Disposition: Home  Diet: Carb modified.   Special Instructions: Wash incision with soap and water, keep clean and dry Wear support stockings daily. Wrap right knee with ace wrap for edema control.  3. Recommend repeat BMET and CBC in 1 week to monitor labs.    Discharge Instructions     [facility] pharmacist to adjust dose for Vancomycin, Aminoglycosides and other anti-infective therapies as requested by physician.   Complete by: As directed    [facility] to provide Cath Flo 17m   Complete by: As directed    Administer for PICC line occlusion and as ordered by physician for other access device issues.   Anaphylaxis Kit: Provided to treat any anaphylactic reaction to the medication being provided to the patient if First Dose or when requested by physician   Complete by: As directed    Epinephrine 160mml vial / amp: Administer 0.35m74m0.35ml235mubcutaneously once for moderate to severe anaphylaxis, nurse to call physician and pharmacy when reaction occurs and call 911 if needed for immediate care   Diphenhydramine 50mg60mIV vial: Administer 25-50mg 47mM PRN for first dose reaction, rash, itching, mild  reaction, nurse to call physician and pharmacy when reaction occurs   Sodium Chloride 0.9% NS 500ml I66mdminister if needed for hypovolemic blood pressure drop or as ordered by physician after call to physician with anaphylactic reaction   Change dressing on IV access line weekly and PRN   Complete by: As directed    Flush IV access with Sodium Chloride 0.9% and Heparin 10 units/ml or 100 units/ml   Complete by: As directed    Home infusion instructions - [facility]   Complete by: As directed    Instructions: Flush IV access with Sodium Chloride 0.9% and Heparin 10units/ml or 100units/ml   Change dressing on IV access line: Weekly and PRN   Instructions Cath Flo 2mg: Ad27mister for PICC Line occlusion and as ordered by physician for other access device   [facility] pharmacist to adjust dose for: Vancomycin, Aminoglycosides and other anti-infective therapies as requested by physician   Method of administration may be changed at the discretion of home infusion pharmacist based upon assessment of the patient and/or caregiver's ability to self-administer the medication ordered  Complete by: As directed       Allergies as of 04/18/2021   No Known Allergies      Medication List     STOP taking these medications    Green Tea 150 MG Caps   ibuprofen 600 MG tablet Commonly known as: ADVIL   metFORMIN 500 MG 24 hr tablet Commonly known as: GLUCOPHAGE-XR       TAKE these medications    acetaminophen 325 MG tablet Commonly known as: TYLENOL Take 1-2 tablets (325-650 mg total) by mouth every 4 (four) hours as needed for mild pain.   albuterol 108 (90 Base) MCG/ACT inhaler Commonly known as: VENTOLIN HFA Inhale 2 puffs into the lungs every 6 (six) hours as needed for wheezing or shortness of breath.   amLODipine 10 MG tablet Commonly known as: NORVASC Take 1 tablet (10 mg total) by mouth daily.   aspirin 81 MG chewable tablet Chew 1 tablet (81 mg total)  by mouth 2 (two) times daily. What changed: when to take this   Basaglar KwikPen 100 UNIT/ML Inject 22 Units into the skin daily.   Cinnamon 500 MG capsule Take 500 mg by mouth daily.   docusate sodium 100 MG capsule Commonly known as: COLACE Take 1 capsule (100 mg total) by mouth 2 (two) times daily.   fluticasone 50 MCG/ACT nasal spray Commonly known as: FLONASE Place 2 sprays into both nostrils daily.   insulin lispro 100 UNIT/ML KwikPen Commonly known as: HUMALOG Inject 9 Units into the skin 3 (three) times daily.   Insulin Pen Needle 32G X 4 MM Misc Use as directed 4 (four) times daily.   losartan 50 MG tablet Commonly known as: COZAAR Take 1 tablet (50 mg total) by mouth at bedtime.   melatonin 5 MG Tabs Take 1 tablet (5 mg total) by mouth at bedtime.   menthol-cetylpyridinium 3 MG lozenge Commonly known as: CEPACOL Take 1 lozenge (3 mg total) by mouth as needed for sore throat.   methocarbamol 500 MG tablet Commonly known as: ROBAXIN Take 1 tablet (500 mg total) by mouth 4 (four) times daily.   metoprolol tartrate 50 MG tablet Commonly known as: LOPRESSOR Take 1 tablet (50 mg total) by mouth 2 (two) times daily.   morphine 30 MG 12 hr tablet--Rx # 14 pills Commonly known as: [name] Take 1 tablet (30 mg total) by mouth every 12 (twelve) hours.   multivitamin capsule Take 1 capsule by mouth daily.   nitroGLYCERIN 0.4 MG SL tablet Commonly known as: NITROSTAT Place 1 tablet (0.4 mg total) under the tongue every 5 (five) minutes as needed.   Omega-3 1000 MG Caps Take 1,000 mg by mouth daily.   oxyCODONE 5 MG immediate release tablet--Rx#14 pills Commonly known as: Oxy IR/ROXICODONE Take 1 tablet (5 mg total) by mouth 2 (two) times daily as needed for moderate pain or severe pain. What changed: when to take this   phenol 1.4 % Liqd Commonly known as: CHLORASEPTIC Use as directed 1 spray in the mouth or throat as needed for throat irritation /  pain.   polyethylene glycol powder 17 GM/SCOOP powder Commonly known as: GLYCOLAX/MIRALAX Dissolve 1 capful (17 g) in water and drink 2 (two) times daily.   senna 8.6 MG Tabs tablet Commonly known as: SENOKOT Take 2 tablets (17.2 mg total) by mouth 2 (two) times daily.   vancomycin  IVPB Inject 1,000 mg into the vein every 12 (twelve) hours. Indication:  MRSA bacteremia and suspected endocarditis First  Dose: Yes Last Day of Therapy:  05/19/21 Labs - Sunday/Monday:  CBC/D, BMP, and vancomycin trough. Labs - Thursday:  BMP and vancomycin trough Labs - Every other week:  ESR and CRP Method of administration:Elastomeric Method of administration may be changed at the discretion of the patient and/or caregiver's ability to self-administer the medication ordered.               Discharge Care Instructions  (From admission, onward)           Start     Ordered   04/15/21 0000  Change dressing on IV access line weekly and PRN  (Home infusion instructions - [facility] )        08 /09/22 1404            Follow-up Information     Meredith Staggers, MD Follow up.   Specialty: Physical Medicine and Rehabilitation Contact information: [address] [place] [address] [phone]         Rod Can, MD Follow up on 05/02/2021.   Specialty: Orthopedic Surgery Why: appointment at 4 pm Contact information: [address] [place] [address] [phone]         Raina Mina., MD. Call on 04/30/2021.   Specialty: Internal Medicine Why: for post hospital follow up Contact information: [place] [address] [phone]                 Signed: Bary Leriche 04/21/2021, 10:52 PM

## 2021-04-18 NOTE — Progress Notes (Signed)
Inpatient Rehabilitation Care Coordinator Discharge Note  The overall goal for the admission was met for:   Discharge location: Yes-HOME WITH HUSBAND WHO CAN PROVIDE 24/7 CARE  Length of Stay: Yes-8 DAYS  Discharge activity level: Yes-CGA LEVEL  Home/community participation: Yes  Services provided included: MD, RD, PT, OT, RN, CM, Pharmacy, and SW  Financial Services: Medicare and Private Insurance: [place] offered to/list presented to:PT AND HUSBAND  Follow-up services arranged: Home Health: [facility]-PT, OT RN, DME: [place] 3 IN 1, and Patient/Family request agency HH: PREF, DME: NO PREF  PT TO GET [facility] DUE TO COVERAGE  Comments (or additional information):HUSBAND HERE DAILY AND ATTENDED THERAPIES, FEELS COMFORTABLE WITH CARE AND READY TO [place]  Patient/Family verbalized understanding of follow-up arrangements: Yes  Individual responsible for coordination of the follow-up plan: [facility] [phone]  Confirmed correct DME delivered: Elease Hashimoto 04/18/2021    Antha Niday, [place]

## 2021-04-18 NOTE — Progress Notes (Signed)
PROGRESS NOTE   Subjective/Complaints:  Pt ready for d/c- asking about meds- has appointment with PCP in the next week- will give 7 days of meds.   Also needs f/u with ID.    ROS:   Pt denies SOB, abd pain, CP, N/V/C/D, and vision changes     Objective:   No results found. No results for input(s): WBC, HGB, HCT, PLT in the last 72 hours. Recent Labs    04/17/21 0251 04/18/21 0527  NA 134* 132*  K 4.4 4.3  CL 99 96*  CO2 28 26  GLUCOSE 146* 154*  BUN 9 8  CREATININE 1.01* 0.94  CALCIUM 9.1 9.3    Intake/Output Summary (Last 24 hours) at 04/18/2021 0835 Last data filed at 04/17/2021 1743 Gross per 24 hour  Intake 530 ml  Output --  Net 530 ml        Physical Exam: Vital Signs Blood pressure 120/65, pulse 68, temperature 98.3 F (36.8 C), temperature source Oral, resp. rate 18, weight 82.9 kg, SpO2 100 %.    General: awake, alert, appropriate, up in w/c; husband at bedside;  NAD HENT: conjugate gaze; oropharynx moist; less nasal congestion- less nasal CV: regular rate; no JVD Pulmonary: CTA B/L; no W/R/R- good air movement GI: soft, NT, ND, (+)BS Psychiatric: appropriate Neurological: Ox3   Skin: Warm and dry.  Intact. Musc: Right knee with edema and tenderness. Less edematous Neurological: Alert Motor: Bilateral upper extremities: 5/5 proximal distal Left lower extremity: Hip flexion, knee extension 3+/5, ankle dorsiflexion 5/5 Right lower extremity: Hip flexion, knee extension 1/5, ankle dorsiflexion 5/5  Assessment/Plan: 1. Functional deficits which require 3+ hours per day of interdisciplinary therapy in a comprehensive inpatient rehab setting. Physiatrist is providing close team supervision and 24 hour management of active medical problems listed below. Physiatrist and rehab team continue to assess barriers to discharge/monitor patient progress toward functional and medical goals  Care  Tool:  Bathing    Body parts bathed by patient: Right arm, Left arm, Chest, Abdomen, Front perineal area, Right upper leg, Left upper leg, Face, Buttocks, Left lower leg, Right lower leg   Body parts bathed by helper: Right lower leg     Bathing assist Assist Level: Supervision/Verbal cueing     Upper Body Dressing/Undressing Upper body dressing   What is the patient wearing?: Pull over shirt    Upper body assist Assist Level: Set up assist    Lower Body Dressing/Undressing Lower body dressing      What is the patient wearing?: Pants     Lower body assist Assist for lower body dressing: Supervision/Verbal cueing     Toileting Toileting    Toileting assist Assist for toileting: Supervision/Verbal cueing     Transfers Chair/bed transfer  Transfers assist  Chair/bed transfer activity did not occur: Safety/medical concerns (Pain, fatigue)  Chair/bed transfer assist level: Supervision/Verbal cueing (RW)     Locomotion Ambulation   Ambulation assist   Ambulation activity did not occur: Safety/medical concerns (Pain, fatigue)  Assist level: Supervision/Verbal cueing Assistive device: Walker-rolling Max distance: 200'   Walk 10 feet activity   Assist  Walk 10 feet activity did not occur: Safety/medical concerns (  Pain, fatigue)  Assist level: Supervision/Verbal cueing Assistive device: Walker-rolling   Walk 50 feet activity   Assist Walk 50 feet with 2 turns activity did not occur: Safety/medical concerns (Pain, fatigue)  Assist level: Supervision/Verbal cueing Assistive device: Walker-rolling    Walk 150 feet activity   Assist Walk 150 feet activity did not occur: Safety/medical concerns (Pain, fatigue)  Assist level: Supervision/Verbal cueing Assistive device: Walker-rolling    Walk 10 feet on uneven surface  activity   Assist Walk 10 feet on uneven surfaces activity did not occur: Safety/medical concerns (Pain, fatigue)   Assist  level: Supervision/Verbal cueing Assistive device: Aeronautical engineer Will patient use wheelchair at discharge?: Yes Type of Wheelchair: Manual Wheelchair activity did not occur: Safety/medical concerns (Pain, fatigue)  Wheelchair assist level: Supervision/Verbal cueing Max wheelchair distance: 180'    Wheelchair 50 feet with 2 turns activity    Assist    Wheelchair 50 feet with 2 turns activity did not occur: Safety/medical concerns (Pain, fatigue)   Assist Level: Supervision/Verbal cueing   Wheelchair 150 feet activity     Assist  Wheelchair 150 feet activity did not occur: Safety/medical concerns (Pain, fatigue)   Assist Level: Supervision/Verbal cueing   Blood pressure 120/65, pulse 68, temperature 98.3 F (36.8 C), temperature source Oral, resp. rate 18, weight 82.9 kg, SpO2 100 %.  Medical Problem List and Plan: 1.  Paraspinal myositis, lumbar facet septic arthritis MRSA bacteremia  Con't CIR PT and OT-  D/c today- wil need ID f/u and 7 days of pain meds 2.  Antithrombotics: -DVT/anticoagulation:  Pharmaceutical: Lovenox             -antiplatelet therapy: N/a 3. Pain Management: Throbbing/jabbing at knee/thigh --Oxycodone prn not effective             --added Oxycontin 8/4 for more consistent relief. -8/6 increased to 15 mg q12 given ongoing pain   - Robaxin--> schedule qid  for muscle aches. 8/7 ordered kpad  8/8- increased oxycontin to 20 mg BID and con't prn Oxy- sounds like she's not asking much for prns- recommended to husband to ask for her when he's here.  Improving with consistent medications. 8/11- asked pt to ask PCP if they can write for her pain meds at home- since not long term- asked her to call PCP 4. Mood: LCSW to follow for evaluation and support.             -antipsychotic agents: N/A 5. Neuropsych: This patient is capable of making decisions on her own behalf. 6. Skin/Wound Care: Routine pressure relief measures.  Told by ortho staples may be removed after 10d  Plan to DC staples tomorrow.   8/11- d/c staples 7. Fluids/Electrolytes/Nutrition: Monitor I/Os.  8. Disseminated MRSA infection: Vancomycin w/July 30th as day #1 for 6-8 weeks followed by extended oral course. CRP, ESR elevated on 8/8 9. T2DM: Hgb A1C- 9.8 and poorly controlled.             --Monitor BS ac/hs and use SSI for tighter control.             --Continue Insulin 9 units meal coverage.   CBG (last 3)  Recent Labs    04/17/21 1611 04/17/21 2137 04/18/21 0634  GLUCAP 78 197* 162*  Increased semglee to 22 on 8/8 Remains elevated, will not make any changes today, given recent increase. 8/11- BG's- still elevated- supposed to go home tomorrow-wasn't on insulin- will d/w PA to get  this figured out- will need to go home on insulin- will likely need monitor, etc. 8/12- needs to go home on Insulin  10. HTN: Monitor BP TID- continues to be labile --continue hydralazine, Norvasc, Losartan and metoprolol Vitals:   04/17/21 2023 04/18/21 0354  BP: (!) 116/56 120/65  Pulse: 76 68  Resp: 17 18  Temp: 98.2 F (36.8 C) 98.3 F (36.8 C)  SpO2: 100% 100%  Controlled with meds on 8/10 11. Severe constipation:              --continue Senna S - 2 tabs bid -increase miralax to bid as well. 12. Septic Knee s/p debridement: WBAT with KI when up. 13.  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  14. Nasal congestion  8/11- added Flonase 2 sprays daily and today needs to be last day of Afrin- explained to pt.  15. Dispo  8/12- d/c with ID f/u and H/H as well as has f/u with PCP- can f/u with me prn.    LOS: 8 days A FACE TO FACE EVALUATION WAS PERFORMED  Zeta Bucy 04/18/2021, 8:35 AM

## 2021-04-22 ENCOUNTER — Telehealth: Payer: Self-pay | Admitting: *Deleted

## 2021-04-22 MED ORDER — MORPHINE SULFATE ER 30 MG PO TBCR: 30.0000 mg | EXTENDED_RELEASE_TABLET | Freq: Two times a day (BID) | ORAL | 0 refills | Status: DC

## 2021-04-22 MED ORDER — OXYCODONE HCL 5 MG PO TABS: 5.0000 mg | ORAL_TABLET | Freq: Two times a day (BID) | ORAL | 0 refills | Status: DC | PRN

## 2021-04-22 NOTE — Telephone Encounter (Signed)
Katie Serrano will need a new Rx sent in for her oxycodone 5 mg bid and Morphine 30 mg bid filled 04/18/21 per PMP. She has 7 pills left of each and her return appt is not until 05/07/21.

## 2021-04-22 NOTE — Telephone Encounter (Signed)
Transitional Care call--I spoke with Mrs Oborn    Are you/is patient experiencing any problems since coming home? Are there any questions regarding any aspect of care? NO Are there any questions regarding medications administration/dosing? Are meds being taken as prescribed? Patient should review meds with caller to confirm She has medications. She is asking about pain medication since they only received 7 days. She has 7 pills left of each Morphine and Oxycodone. I have advised her to bring bottles and I will let Dr Riley Kill know to send in new RXs Have there been any falls? NO Has Home Health been to the house and/or have they contacted you? If not, have you tried to contact them? Can we help you contact them? YES they have all been out Are bowels and bladder emptying properly? Are there any unexpected incontinence issues? If applicable, is patient following bowel/bladder programs? NO problem Any fevers, problems with breathing, unexpected pain? No unexpected pain.  Are there any skin problems or new areas of breakdown? NO Has the patient/family member arranged specialty MD follow up (ie cardiology/neurology/renal/surgical/etc)?  Can we help arrange? She has appt with PCP tomorrow 8/17. I have given her appt with Jacalyn Lefevre in our office for 05/07/21 and all other appts are scheduled Does the patient need any other services or support that we can help arrange? NO Are caregivers following through as expected in assisting the patient? YES Has the patient quit smoking, drinking alcohol, or using drugs as recommended? YES  Appointment Wednesday 8/31/ 22 with Jacalyn Lefevre NP @11 :00 arrive by 10:40 Address reviewed and told to look for packet from our office (**appt date is wrong on card**) [address]

## 2021-04-22 NOTE — Telephone Encounter (Signed)
This an excerpt from Dr. Dahlia Client last hospital note:  8/11- asked pt to ask PCP if they can write for her pain meds at home- since not long term- asked her to call PCP   I refilled today with enough to get through to next appt.

## 2021-04-25 ENCOUNTER — Telehealth: Payer: Self-pay

## 2021-04-25 ENCOUNTER — Ambulatory Visit (INDEPENDENT_AMBULATORY_CARE_PROVIDER_SITE_OTHER): Payer: Medicare Other | Admitting: Infectious Disease

## 2021-04-25 ENCOUNTER — Encounter: Payer: Self-pay | Admitting: Infectious Disease

## 2021-04-25 ENCOUNTER — Other Ambulatory Visit: Payer: Self-pay

## 2021-04-25 VITALS — BP 150/75 | HR 68 | Temp 98.2°F | Ht 65.0 in | Wt 202.0 lb

## 2021-04-25 DIAGNOSIS — I6310 Cerebral infarction due to embolism of unspecified precerebral artery: Secondary | ICD-10-CM

## 2021-04-25 DIAGNOSIS — B9562 Methicillin resistant Staphylococcus aureus infection as the cause of diseases classified elsewhere: Secondary | ICD-10-CM | POA: Diagnosis not present

## 2021-04-25 DIAGNOSIS — M4640 Discitis, unspecified, site unspecified: Secondary | ICD-10-CM

## 2021-04-25 DIAGNOSIS — R7881 Bacteremia: Secondary | ICD-10-CM | POA: Diagnosis present

## 2021-04-25 DIAGNOSIS — I25118 Atherosclerotic heart disease of native coronary artery with other forms of angina pectoris: Secondary | ICD-10-CM

## 2021-04-25 DIAGNOSIS — M6009 Infective myositis, multiple sites: Secondary | ICD-10-CM

## 2021-04-25 DIAGNOSIS — L8990 Pressure ulcer of unspecified site, unspecified stage: Secondary | ICD-10-CM | POA: Insufficient documentation

## 2021-04-25 DIAGNOSIS — M4646 Discitis, unspecified, lumbar region: Secondary | ICD-10-CM

## 2021-04-25 DIAGNOSIS — L89311 Pressure ulcer of right buttock, stage 1: Secondary | ICD-10-CM

## 2021-04-25 DIAGNOSIS — I10 Essential (primary) hypertension: Secondary | ICD-10-CM

## 2021-04-25 DIAGNOSIS — I330 Acute and subacute infective endocarditis: Secondary | ICD-10-CM | POA: Diagnosis not present

## 2021-04-25 DIAGNOSIS — M47816 Spondylosis without myelopathy or radiculopathy, lumbar region: Secondary | ICD-10-CM | POA: Diagnosis not present

## 2021-04-25 DIAGNOSIS — I76 Septic arterial embolism: Secondary | ICD-10-CM

## 2021-04-25 DIAGNOSIS — G062 Extradural and subdural abscess, unspecified: Secondary | ICD-10-CM | POA: Diagnosis not present

## 2021-04-25 HISTORY — DX: Discitis, unspecified, site unspecified: M46.40

## 2021-04-25 HISTORY — DX: Pressure ulcer of unspecified site, unspecified stage: L89.90

## 2021-04-25 HISTORY — DX: Spondylosis without myelopathy or radiculopathy, lumbar region: M47.816

## 2021-04-25 MED ORDER — DOXYCYCLINE HYCLATE 100 MG PO TABS
100.0000 mg | ORAL_TABLET | Freq: Two times a day (BID) | ORAL | 1 refills | Status: DC
Start: 2021-04-25 — End: 2021-06-23

## 2021-04-25 NOTE — Telephone Encounter (Signed)
Relayed verbal orders to [place] at [facility] that okay to pull PICC after last dose. Orders repeated and verified.   Sandie Ano, RN

## 2021-04-25 NOTE — Progress Notes (Signed)
Subjective:  Chief complaints still with low back pain though it is improved and she is using less narcotics, right knee pain so a skin lesion on the left thigh that popped up recently.  Finally she has a pressure ulcer on her buttocks that developed in the hospital that she told me about.   Patient ID: Katie Serrano, female    DOB: 11-25-1955, 65 y.o.   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 Delfino Lovett.  Is on course to complete vancomycin on September 14.  Her back pain is improved dramatically since she was in the hospital she still needs opiates to control the pain at times but less so than she did when she was hospitalized.  Knee pain is improved as well as has range of motion.  She is continue on vancomycin currently 1 g twice daily.  Most recent labs show her vancomycin trough at 14.3.  Her sedimentation rate is encouraging having gone down from a value of 93 in the hospital to 21 on most recent labs performed 3 days ago  C-reactive protein being done by home health is a quantitative 1 and is listed at 113 which I believe does not same type of C-reactive protein we are doing.  Is an area on her left thigh that she and her husband asked me to look at.  It does not appear infected I am not sure how she developed this she does not recall any trauma to the area she also says that she developed a pressure ulcer on her right buttocks but that is getting better.  She did not want me to examine it.      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   Elevated C-reactive protein (CRP) 01/15/2021   Encounter for long-term current use of high risk medication 01/29/2016   Essential hypertension 01/29/2016   Gastroesophageal reflux disease 01/29/2016   Intermittent asthma without complication 4/0/9811   Mixed hyperlipidemia 05/01/2019   Osteoarthritis of multiple joints 01/29/2016   Shortness of breath 01/13/2021   Tobacco use disorder 12/02/2017   Type 2 diabetes mellitus without complication, without long-term current use of insulin ([place]) 01/30/2016   Last Assessment & Plan:  Formatting of this note might be different from the original. Relevant Hx: Course: Daily Update: Today's Plan:discussed with her that her sugars will increase and she has actively worked on her diet and diet and lost from 242 to 188 which I commend her on, she is to continue with her weight loss efforts despite taking this   Electronically signed by: Danie Chandler    Past Surgical History:  Procedure Laterality Date   APPENDECTOMY     BUBBLE STUDY  04/04/2021   Procedure: BUBBLE STUDY;  Surgeon: Geralynn Rile, MD;  Location: [facility];  Service: Cardiovascular;;   BUNIONECTOMY Right    I &  D EXTREMITY Right 04/05/2021   Procedure: IRRIGATION AND DEBRIDEMENT KNEE;  Surgeon: Rod Can, MD;  Location: [place];  Service: Orthopedics;  Laterality: Right;   LEFT HEART CATH AND CORONARY ANGIOGRAPHY N/A 03/12/2021   Procedure: LEFT HEART CATH AND CORONARY ANGIOGRAPHY;  Surgeon: Troy Sine, MD;  Location: [place] [facility];  Service: Cardiovascular;  Laterality: N/A;   NECK SURGERY     SMALL INTESTINE SURGERY     TEE WITHOUT CARDIOVERSION N/A 04/04/2021   Procedure: TRANSESOPHAGEAL ECHOCARDITRRROGRAM (TEE);  Surgeon: Geralynn Rile, MD;  Location: [facility] ENDOSCOPY;  Service: Cardiovascular;  Laterality: N/A;   TONSILLECTOMY      Family History  Problem Relation Age of Onset   Diabetes Mother    Heart attack Mother 63   Angina Father    Heart attack Father 38   Diabetes Paternal Grandmother       Social History   Socioeconomic History   Marital status: Married    Spouse name: Not on file   Number of children: Not on file   Years of education: Not on file   Highest education level: Not on file  Occupational History   Not on file  Tobacco Use   Smoking status: Every Day    Packs/day: 0.50    Types: Cigarettes   Smokeless tobacco: Never  Vaping Use   Vaping Use: Never used  Substance and Sexual Activity   Alcohol use: Yes    Alcohol/week: 1.0 standard drink    Types: 1 Glasses of wine per week    Comment: Socially   Drug use: Never   Sexual activity: Not on file  Other Topics Concern   Not on file  Social History Narrative   Not on file   Social Determinants of Health   Financial Resource Strain: Not on file  Food Insecurity: Not on file  Transportation Needs: Not on file  Physical Activity: Not on file  Stress: Not on file  Social Connections: Not on file    No Known Allergies   Current Outpatient Medications:    acetaminophen (TYLENOL) 325 MG tablet, Take 1-2 tablets (325-650 mg total) by mouth every 4 (four) hours as needed for mild pain., Disp: , Rfl:    albuterol (VENTOLIN HFA) 108 (90 Base) MCG/ACT inhaler, Inhale 2 puffs into the lungs every 6 (six) hours as needed for wheezing or shortness of breath., Disp: , Rfl:    amLODipine (NORVASC) 10 MG tablet, Take 1 tablet (10 mg total) by mouth daily., Disp: 30 tablet, Rfl: 0   aspirin 81 MG chewable tablet, Chew 1 tablet (81 mg total) by mouth 2 (two) times daily., Disp: 60 tablet, Rfl: 0   Cinnamon 500 MG capsule, Take 500 mg by mouth daily., Disp: , Rfl:    doxycycline (VIBRA-TABS) 100 MG tablet, Take 1 tablet (100 mg  total) by mouth 2 (two) times daily., Disp: 60 tablet, Rfl: 1   fluticasone (FLONASE) 50 MCG/ACT nasal spray, Place 2 sprays into both nostrils daily., Disp: 16 g, Rfl: 0   Insulin Glargine (BASAGLAR KWIKPEN) 100 UNIT/ML, Inject 22 Units into the skin daily., Disp: 15 mL, Rfl: 0   Insulin Pen Needle 32G X 4 MM MISC, Use as directed 4 (four) times daily., Disp: 200 each, Rfl: 0   losartan (COZAAR) 50 MG tablet, Take 1 tablet (50 mg total) by mouth at bedtime., Disp: 30 tablet, Rfl: 0   melatonin 5 MG TABS, Take 1 tablet (5 mg total) by mouth  at bedtime., Disp: 30 tablet, Rfl: 0   menthol-cetylpyridinium (CEPACOL) 3 MG lozenge, Take 1 lozenge (3 mg total) by mouth as needed for sore throat., Disp: 100 tablet, Rfl: 12   methocarbamol (ROBAXIN) 500 MG tablet, Take 1 tablet (500 mg total) by mouth 4 (four) times daily., Disp: 120 tablet, Rfl: 0   metoprolol tartrate (LOPRESSOR) 50 MG tablet, Take 1 tablet (50 mg total) by mouth 2 (two) times daily., Disp: 60 tablet, Rfl: 0   morphine ([name]) 30 MG 12 hr tablet, Take 1 tablet (30 mg total) by mouth every 12 (twelve) hours., Disp: 30 tablet, Rfl: 0   Multiple Vitamin (MULTIVITAMIN) capsule, Take 1 capsule by mouth daily., Disp: , Rfl:    nitroGLYCERIN (NITROSTAT) 0.4 MG SL tablet, Place 1 tablet (0.4 mg total) under the tongue every 5 (five) minutes as needed., Disp: 25 tablet, Rfl: 3   Omega-3 1000 MG CAPS, Take 1,000 mg by mouth daily., Disp: , Rfl:    oxyCODONE (OXY IR/ROXICODONE) 5 MG immediate release tablet, Take 1 tablet (5 mg total) by mouth 2 (two) times daily as needed for moderate pain or severe pain., Disp: 20 tablet, Rfl: 0   SEMGLEE, YFGN, 100 UNIT/ML Pen, SMARTSIG:22 Unit(s) SUB-Q Daily, Disp: , Rfl:    vancomycin IVPB, Inject 1,000 mg into the vein every 12 (twelve) hours. Indication:  MRSA bacteremia and suspected endocarditis First Dose: Yes Last Day of Therapy:  05/19/21 Labs - Sunday/Monday:  CBC/D, BMP, and vancomycin trough. Labs -  Thursday:  BMP and vancomycin trough Labs - Every other week:  ESR and CRP Method of administration:Elastomeric Method of administration may be changed at the discretion of the patient and/or caregiver's ability to self-administer the medication ordered., Disp: 88 Units, Rfl: 0   docusate sodium (COLACE) 100 MG capsule, Take 1 capsule (100 mg total) by mouth 2 (two) times daily. (Patient not taking: Reported on 04/25/2021), Disp: 60 capsule, Rfl: 0   insulin lispro (HUMALOG) 100 UNIT/ML KwikPen, Inject 9 Units into the skin 3 (three) times daily. (Patient not taking: Reported on 04/25/2021), Disp: 15 mL, Rfl: 11   phenol (CHLORASEPTIC) 1.4 % LIQD, Use as directed 1 spray in the mouth or throat as needed for throat irritation / pain. (Patient not taking: Reported on 04/25/2021), Disp: 118 mL, Rfl: 0   polyethylene glycol powder (GLYCOLAX/MIRALAX) 17 GM/SCOOP powder, Dissolve 1 capful (17 g) in water and drink 2 (two) times daily. (Patient not taking: Reported on 04/25/2021), Disp: 510 g, Rfl: 0   senna (SENOKOT) 8.6 MG TABS tablet, Take 2 tablets (17.2 mg total) by mouth 2 (two) times daily. (Patient not taking: Reported on 04/25/2021), Disp: 120 tablet, Rfl: 0   Review of Systems  Constitutional:  Negative for activity change, appetite change, chills, diaphoresis, fatigue, fever and unexpected weight change.  HENT:  Negative for congestion, rhinorrhea, sinus pressure, sneezing, sore throat and trouble swallowing.   Eyes:  Negative for photophobia and visual disturbance.  Respiratory:  Negative for cough, chest tightness, shortness of breath, wheezing and stridor.   Cardiovascular:  Negative for chest pain, palpitations and leg swelling.  Gastrointestinal:  Negative for abdominal distention, abdominal pain, anal bleeding, blood in stool, constipation, diarrhea, nausea and vomiting.  Genitourinary:  Negative for difficulty urinating, dysuria, flank pain and hematuria.  Musculoskeletal:  Positive for back  pain. Negative for arthralgias, gait problem, joint swelling and myalgias.  Skin:  Negative for color change, pallor, rash and wound.  Neurological:  Negative for dizziness,  tremors, weakness and light-headedness.  Hematological:  Negative for adenopathy. Does not bruise/bleed easily.  Psychiatric/Behavioral:  Negative for agitation, behavioral problems, confusion, decreased concentration, dysphoric mood and sleep disturbance.       Objective:   Physical Exam Constitutional:      General: She is not in acute distress.    Appearance: Normal appearance. She is well-developed. She is not ill-appearing or diaphoretic.  HENT:     Head: Normocephalic and atraumatic.     Right Ear: Hearing and external ear normal.     Left Ear: Hearing and external ear normal.     Nose: No nasal deformity or rhinorrhea.  Eyes:     General: No scleral icterus.    Extraocular Movements: Extraocular movements intact.     Conjunctiva/sclera: Conjunctivae normal.     Right eye: Right conjunctiva is not injected.     Left eye: Left conjunctiva is not injected.     Pupils: Pupils are equal, round, and reactive to light.  Neck:     Vascular: No JVD.  Cardiovascular:     Rate and Rhythm: Normal rate and regular rhythm.     Heart sounds: Normal heart sounds, S1 normal and S2 normal. No murmur heard.   No friction rub. No gallop.  Pulmonary:     Effort: Pulmonary effort is normal. No respiratory distress.     Breath sounds: Normal breath sounds. No stridor. No wheezing or rales.  Abdominal:     General: Bowel sounds are normal. There is no distension.     Palpations: Abdomen is soft.     Tenderness: There is no abdominal tenderness.  Musculoskeletal:        General: Normal range of motion.     Right shoulder: Normal.     Left shoulder: Normal.     Cervical back: Normal range of motion and neck supple.     Right hip: Normal.     Left hip: Normal.     Right knee: Normal.     Left knee: Normal.   Lymphadenopathy:     Head:     Right side of head: No submandibular, preauricular or posterior auricular adenopathy.     Left side of head: No submandibular, preauricular or posterior auricular adenopathy.     Cervical: No cervical adenopathy.     Right cervical: No superficial or deep cervical adenopathy.    Left cervical: No superficial or deep cervical adenopathy.  Skin:    General: Skin is warm and dry.     Coloration: Skin is not jaundiced or pale.     Findings: No abrasion, bruising, ecchymosis, erythema, lesion or rash.     Nails: There is no clubbing.  Neurological:     General: No focal deficit present.     Mental Status: She is alert and oriented to person, place, and time.     Sensory: No sensory deficit.     Coordination: Coordination normal.     Gait: Gait normal.  Psychiatric:        Attention and Perception: She is attentive.        Mood and Affect: Mood normal.        Speech: Speech normal.        Behavior: Behavior normal. Behavior is cooperative.        Thought Content: Thought content normal.        Judgment: Judgment normal.    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  At that point time we will discontinue her PICC line.  Then we will start doxycycline 100 mg twice daily which I have called into her pharmacy.  Thigh lesion not clear what this is due to but I am not overly concerned  Pressure ulcer: Recommended padding to help that area heal.  I spent 42 minutes with the patient including  face to face counseling of the patient and her husband regarding the nature of metastatic MRSA infections including spread to spine and joints  such as the knee we treat and monitor response to such infections, personally reviewing her MRI lumbar spine from her hospitalization or TEE, reviewing her labs from home health including her basic metabolic panel which shows her renal function is normal with a creatinine of 0.61 her vancomycin trough of 14.3 her CBC with differential which shows a stable hemoglobin of 8.7 normal platelets of 550 sedimentation rate of 21 quantitative C-reactive protein of 113, along with p\ review of medical records in preparation for the visit and during the visit and in coordination of her care.

## 2021-04-25 NOTE — Telephone Encounter (Signed)
Thank you :)

## 2021-04-28 ENCOUNTER — Ambulatory Visit: Payer: Medicare Other | Admitting: Cardiology

## 2021-05-01 ENCOUNTER — Ambulatory Visit: Payer: Medicare Other | Admitting: Infectious Disease

## 2021-05-01 ENCOUNTER — Emergency Department (HOSPITAL_BASED_OUTPATIENT_CLINIC_OR_DEPARTMENT_OTHER): Payer: Medicare Other

## 2021-05-01 ENCOUNTER — Emergency Department (HOSPITAL_COMMUNITY): Payer: Medicare Other

## 2021-05-01 ENCOUNTER — Observation Stay (HOSPITAL_COMMUNITY)
Admission: EM | Admit: 2021-05-01 | Discharge: 2021-05-02 | Disposition: A | Discharge status: Home / Self Care | Payer: Medicare Other | Attending: Internal Medicine | Admitting: Internal Medicine

## 2021-05-01 ENCOUNTER — Encounter (HOSPITAL_COMMUNITY): Payer: Self-pay | Admitting: Internal Medicine

## 2021-05-01 ENCOUNTER — Telehealth: Payer: Self-pay

## 2021-05-01 DIAGNOSIS — I25118 Atherosclerotic heart disease of native coronary artery with other forms of angina pectoris: Secondary | ICD-10-CM | POA: Insufficient documentation

## 2021-05-01 DIAGNOSIS — I82621 Acute embolism and thrombosis of deep veins of right upper extremity: Secondary | ICD-10-CM | POA: Diagnosis present

## 2021-05-01 DIAGNOSIS — B9562 Methicillin resistant Staphylococcus aureus infection as the cause of diseases classified elsewhere: Secondary | ICD-10-CM | POA: Diagnosis not present

## 2021-05-01 DIAGNOSIS — J449 Chronic obstructive pulmonary disease, unspecified: Secondary | ICD-10-CM | POA: Insufficient documentation

## 2021-05-01 DIAGNOSIS — Z833 Family history of diabetes mellitus: Secondary | ICD-10-CM | POA: Insufficient documentation

## 2021-05-01 DIAGNOSIS — Z8249 Family history of ischemic heart disease and other diseases of the circulatory system: Secondary | ICD-10-CM | POA: Insufficient documentation

## 2021-05-01 DIAGNOSIS — Z20822 Contact with and (suspected) exposure to covid-19: Secondary | ICD-10-CM | POA: Diagnosis not present

## 2021-05-01 DIAGNOSIS — Z794 Long term (current) use of insulin: Secondary | ICD-10-CM | POA: Diagnosis not present

## 2021-05-01 DIAGNOSIS — F17210 Nicotine dependence, cigarettes, uncomplicated: Secondary | ICD-10-CM | POA: Insufficient documentation

## 2021-05-01 DIAGNOSIS — Z79899 Other long term (current) drug therapy: Secondary | ICD-10-CM | POA: Diagnosis not present

## 2021-05-01 DIAGNOSIS — Z7984 Long term (current) use of oral hypoglycemic drugs: Secondary | ICD-10-CM | POA: Insufficient documentation

## 2021-05-01 DIAGNOSIS — M7989 Other specified soft tissue disorders: Secondary | ICD-10-CM

## 2021-05-01 DIAGNOSIS — E1169 Type 2 diabetes mellitus with other specified complication: Secondary | ICD-10-CM

## 2021-05-01 DIAGNOSIS — E1165 Type 2 diabetes mellitus with hyperglycemia: Secondary | ICD-10-CM | POA: Diagnosis not present

## 2021-05-01 DIAGNOSIS — I10 Essential (primary) hypertension: Secondary | ICD-10-CM | POA: Diagnosis not present

## 2021-05-01 DIAGNOSIS — G062 Extradural and subdural abscess, unspecified: Secondary | ICD-10-CM

## 2021-05-01 DIAGNOSIS — R7881 Bacteremia: Secondary | ICD-10-CM | POA: Diagnosis not present

## 2021-05-01 DIAGNOSIS — E782 Mixed hyperlipidemia: Secondary | ICD-10-CM | POA: Insufficient documentation

## 2021-05-01 DIAGNOSIS — Z452 Encounter for adjustment and management of vascular access device: Secondary | ICD-10-CM

## 2021-05-01 DIAGNOSIS — K219 Gastro-esophageal reflux disease without esophagitis: Secondary | ICD-10-CM | POA: Insufficient documentation

## 2021-05-01 DIAGNOSIS — A419 Sepsis, unspecified organism: Secondary | ICD-10-CM

## 2021-05-01 DIAGNOSIS — Z7982 Long term (current) use of aspirin: Secondary | ICD-10-CM | POA: Insufficient documentation

## 2021-05-01 DIAGNOSIS — M79601 Pain in right arm: Secondary | ICD-10-CM

## 2021-05-01 DIAGNOSIS — I330 Acute and subacute infective endocarditis: Secondary | ICD-10-CM

## 2021-05-01 LAB — PROTIME-INR
INR: 1 (ref 0.8–1.2)
Prothrombin Time: 13.1 seconds (ref 11.4–15.2)

## 2021-05-01 LAB — URINALYSIS, ROUTINE W REFLEX MICROSCOPIC
Bilirubin Urine: NEGATIVE
Glucose, UA: 50 mg/dL — AB
Hgb urine dipstick: NEGATIVE
Ketones, ur: 5 mg/dL — AB
Nitrite: NEGATIVE
Protein, ur: NEGATIVE mg/dL
Specific Gravity, Urine: 1.005 (ref 1.005–1.030)
pH: 8 (ref 5.0–8.0)

## 2021-05-01 LAB — COMPREHENSIVE METABOLIC PANEL
ALT: 19 U/L (ref 0–44)
AST: 21 U/L (ref 15–41)
Albumin: 2.3 g/dL — ABNORMAL LOW (ref 3.5–5.0)
Alkaline Phosphatase: 111 U/L (ref 38–126)
Anion gap: 7 (ref 5–15)
BUN: <5 mg/dL — ABNORMAL LOW (ref 8–23)
CO2: 30 mmol/L (ref 22–32)
Calcium: 8.5 mg/dL — ABNORMAL LOW (ref 8.9–10.3)
Chloride: 102 mmol/L (ref 98–111)
Creatinine, Ser: 0.75 mg/dL (ref 0.44–1.00)
GFR, Estimated: >60 mL/min (ref >60)
Glucose, Bld: 170 mg/dL — ABNORMAL HIGH (ref 70–99)
Potassium: 2.8 mmol/L — ABNORMAL LOW (ref 3.5–5.1)
Sodium: 139 mmol/L (ref 135–145)
Total Bilirubin: 0.4 mg/dL (ref 0.3–1.2)
Total Protein: 6.3 g/dL — ABNORMAL LOW (ref 6.5–8.1)

## 2021-05-01 LAB — CBC WITH DIFFERENTIAL/PLATELET
Abs Immature Granulocytes: 0.05 10*3/uL (ref 0.00–0.07)
Basophils Absolute: 0.1 10*3/uL (ref 0.0–0.1)
Basophils Relative: 1 %
Eosinophils Absolute: 0.9 10*3/uL — ABNORMAL HIGH (ref 0.0–0.5)
Eosinophils Relative: 9 %
HCT: 32.7 % — ABNORMAL LOW (ref 36.0–46.0)
Hemoglobin: 10.2 g/dL — ABNORMAL LOW (ref 12.0–15.0)
Immature Granulocytes: 1 %
Lymphocytes Relative: 14 %
Lymphs Abs: 1.3 10*3/uL (ref 0.7–4.0)
MCH: 26.6 pg (ref 26.0–34.0)
MCHC: 31.2 g/dL (ref 30.0–36.0)
MCV: 85.2 fL (ref 80.0–100.0)
Monocytes Absolute: 1.1 10*3/uL — ABNORMAL HIGH (ref 0.1–1.0)
Monocytes Relative: 11 %
Neutro Abs: 6.3 10*3/uL (ref 1.7–7.7)
Neutrophils Relative %: 64 %
Platelets: 508 10*3/uL — ABNORMAL HIGH (ref 150–400)
RBC: 3.84 MIL/uL — ABNORMAL LOW (ref 3.87–5.11)
RDW: 14.8 % (ref 11.5–15.5)
WBC: 9.6 10*3/uL (ref 4.0–10.5)
nRBC: 0 % (ref 0.0–0.2)

## 2021-05-01 LAB — APTT: aPTT: 31 seconds (ref 24–36)

## 2021-05-01 LAB — GLUCOSE, CAPILLARY: Glucose-Capillary: 102 mg/dL — ABNORMAL HIGH (ref 70–99)

## 2021-05-01 LAB — LACTIC ACID, PLASMA
Lactic Acid, Venous: 1.2 mmol/L (ref 0.5–1.9)
Lactic Acid, Venous: 0.7 mmol/L (ref 0.5–1.9)

## 2021-05-01 MED ORDER — POLYETHYLENE GLYCOL 3350 17 G PO PACK: 17.0000 g | PACK | Freq: Every day | ORAL | Status: DC | PRN

## 2021-05-01 MED ORDER — METOPROLOL TARTRATE 50 MG PO TABS
50.0000 mg | ORAL_TABLET | Freq: Two times a day (BID) | ORAL | Status: DC
  Administered 2021-05-02: 50 mg via ORAL
  Filled 2021-05-01: qty 1

## 2021-05-01 MED ORDER — ALBUTEROL SULFATE HFA 108 (90 BASE) MCG/ACT IN AERS
2.0000 | INHALATION_SPRAY | Freq: Four times a day (QID) | RESPIRATORY_TRACT | Status: DC | PRN
  Filled 2021-05-01: qty 6.7

## 2021-05-01 MED ORDER — OMEGA-3 1000 MG PO CAPS: 1000.0000 mg | ORAL_CAPSULE | Freq: Every day | ORAL | Status: DC

## 2021-05-01 MED ORDER — PRAVASTATIN SODIUM 10 MG PO TABS
20.0000 mg | ORAL_TABLET | Freq: Once | ORAL | Status: AC
  Administered 2021-05-01: 20 mg via ORAL
  Filled 2021-05-01: qty 2

## 2021-05-01 MED ORDER — ONDANSETRON HCL 4 MG/2ML IJ SOLN: 4.0000 mg | Freq: Four times a day (QID) | INTRAMUSCULAR | Status: DC | PRN

## 2021-05-01 MED ORDER — FLUTICASONE PROPIONATE 50 MCG/ACT NA SUSP: 2.0000 | Freq: Every day | NASAL | Status: DC | PRN

## 2021-05-01 MED ORDER — METHOCARBAMOL 500 MG PO TABS: 500.0000 mg | ORAL_TABLET | Freq: Four times a day (QID) | ORAL | Status: DC | PRN

## 2021-05-01 MED ORDER — NITROGLYCERIN 0.4 MG SL SUBL: 0.4000 mg | SUBLINGUAL_TABLET | SUBLINGUAL | Status: DC | PRN

## 2021-05-01 MED ORDER — INSULIN GLARGINE-YFGN 100 UNIT/ML ~~LOC~~ SOPN
14.0000 [IU] | PEN_INJECTOR | Freq: Every day | SUBCUTANEOUS | Status: DC
  Filled 2021-05-01: qty 3

## 2021-05-01 MED ORDER — ATORVASTATIN CALCIUM 80 MG PO TABS: 80.0000 mg | ORAL_TABLET | Freq: Every day | ORAL | Status: DC

## 2021-05-01 MED ORDER — ACETAMINOPHEN 325 MG PO TABS: 325.0000 mg | ORAL_TABLET | ORAL | Status: DC | PRN

## 2021-05-01 MED ORDER — OXYCODONE HCL 5 MG PO TABS: 5.0000 mg | ORAL_TABLET | Freq: Two times a day (BID) | ORAL | Status: DC | PRN

## 2021-05-01 MED ORDER — OMEGA-3-ACID ETHYL ESTERS 1 G PO CAPS
1.0000 g | ORAL_CAPSULE | Freq: Every day | ORAL | Status: DC
  Administered 2021-05-02: 1 g via ORAL
  Filled 2021-05-01: qty 1

## 2021-05-01 MED ORDER — INSULIN GLARGINE-YFGN 100 UNIT/ML ~~LOC~~ SOLN
14.0000 [IU] | Freq: Every day | SUBCUTANEOUS | Status: DC
  Administered 2021-05-02: 14 [IU] via SUBCUTANEOUS
  Filled 2021-05-01: qty 0.14

## 2021-05-01 MED ORDER — METHOCARBAMOL 500 MG PO TABS
500.0000 mg | ORAL_TABLET | Freq: Once | ORAL | Status: AC
  Administered 2021-05-01: 500 mg via ORAL
  Filled 2021-05-01: qty 1

## 2021-05-01 MED ORDER — ONDANSETRON HCL 4 MG PO TABS: 4.0000 mg | ORAL_TABLET | Freq: Four times a day (QID) | ORAL | Status: DC | PRN

## 2021-05-01 MED ORDER — INSULIN ASPART 100 UNIT/ML IJ SOLN: 0.0000 [IU] | Freq: Three times a day (TID) | INTRAMUSCULAR | Status: DC

## 2021-05-01 MED ORDER — VANCOMYCIN HCL IN DEXTROSE 1-5 GM/200ML-% IV SOLN
1000.0000 mg | Freq: Two times a day (BID) | INTRAVENOUS | Status: DC
  Administered 2021-05-02 (×2): 1000 mg via INTRAVENOUS
  Filled 2021-05-01 (×2): qty 200

## 2021-05-01 MED ORDER — LOSARTAN POTASSIUM 50 MG PO TABS
50.0000 mg | ORAL_TABLET | Freq: Every day | ORAL | Status: DC
  Administered 2021-05-02: 50 mg via ORAL
  Filled 2021-05-01: qty 1

## 2021-05-01 MED ORDER — ACETAMINOPHEN 325 MG PO TABS: 650.0000 mg | ORAL_TABLET | Freq: Four times a day (QID) | ORAL | Status: DC | PRN

## 2021-05-01 MED ORDER — MORPHINE SULFATE 15 MG PO TABS
30.0000 mg | ORAL_TABLET | Freq: Once | ORAL | Status: AC
Start: 2021-05-01 — End: 2021-05-01
  Administered 2021-05-01: 30 mg via ORAL
  Filled 2021-05-01: qty 2

## 2021-05-01 MED ORDER — METOPROLOL TARTRATE 25 MG PO TABS
50.0000 mg | ORAL_TABLET | Freq: Once | ORAL | Status: AC
  Administered 2021-05-01: 50 mg via ORAL
  Filled 2021-05-01: qty 2

## 2021-05-01 MED ORDER — ENOXAPARIN SODIUM 100 MG/ML IJ SOSY
90.0000 mg | PREFILLED_SYRINGE | Freq: Two times a day (BID) | INTRAMUSCULAR | Status: DC
  Administered 2021-05-01 – 2021-05-02 (×2): 90 mg via SUBCUTANEOUS
  Filled 2021-05-01 (×4): qty 0.9

## 2021-05-01 MED ORDER — MORPHINE SULFATE ER 15 MG PO TBCR
30.0000 mg | EXTENDED_RELEASE_TABLET | Freq: Two times a day (BID) | ORAL | Status: DC
  Administered 2021-05-01 – 2021-05-02 (×2): 30 mg via ORAL
  Filled 2021-05-01 (×2): qty 2

## 2021-05-01 MED ORDER — ACETAMINOPHEN 650 MG RE SUPP: 650.0000 mg | Freq: Four times a day (QID) | RECTAL | Status: DC | PRN

## 2021-05-01 NOTE — Telephone Encounter (Signed)
Home health RN called with concerns that patients PICC line is infected. Reports drainage and redness at insertion site. Patient reports that it was red on Sunday when RN changed it but it's worsened and is draining now. RN advised home health team to ask patient to go to the ED for assessment. She stated that she would call patient back and instruct her to go to the ED for evaluation.  Lindee Leason Loyola Mast, RN

## 2021-05-01 NOTE — ED Provider Notes (Signed)
Emergency Medicine Provider Triage Evaluation Note  Katie Serrano , a 65 y.o. female  was evaluated in triage.  Pt complains of PICC line infection.  Review of Systems  Positive: Swelling and redness to R arm PICC line Negative: Fever, chills, cp, sob  Physical Exam  BP (!) 183/95 (BP Location: Left Arm)   Pulse 89   Temp 99 F (37.2 C) (Oral)   Resp 16   LMP  (LMP Unknown)   SpO2 99%  Gen:   Awake, no distress   Resp:  Normal effort  MSK:   Moves extremities without difficulty  Other:  PICC line to R upper arm with surrounding skin erythema, serous fluid drainage and tenderness  Medical Decision Making  Medically screening exam initiated at 5:24 PM.  Appropriate orders placed.  LUPIE SAWA was informed that the remainder of the evaluation will be completed by another provider, this initial triage assessment does not replace that evaluation, and the importance of remaining in the ED until their evaluation is complete.  Pt with PICC Line as treatment for R knee septic joint and myositis near L5 nerve root receiving ABX.  Home health nurse notice increasing redness and drainage from the PICC line site and recommend coming to the ER for further care.  PICC line site is infected.    Fayrene Helper, PA-C 05/01/21 1728    Koleen Distance, MD 05/01/21 [phone]

## 2021-05-01 NOTE — H&P (Addendum)
History and Physical    Katie Serrano JZP:[id number] DOB: 1956-03-05 DOA: 05/01/2021  PCP: Raina Mina., MD  Patient coming from: home   Chief Complaint:  Chief Complaint  Patient presents with   Vascular Access Problem     HPI:    65 year old female with past medical history of hypertension, COPD, insulin-dependent diabetes mellitus type 2 (Hgba1c 9.8% 03/2021), hyperlipidemia, gastroesophageal reflux disease, asthma, coronary artery disease (cath 7/6 revealing multivessel disease medically treated) and recent diagnosis of MRSA bacteremia secondary to urinary tract infection presented to [facility] emergency department with complaints of right upper extremity redness and discomfort surrounding her recently placed PICC line.  Of note, patient was recently hospitalized from 7/24 until 8/4 at [facility].  Patient was found to have sepsis secondary to MRSA urinary tract infection with associated bacteremia.  Patient was initiated on intravenous vancomycin multiple TTE's were performed and revealed no evidence of infective endocarditis.  Due to right lower extremity weakness MRI of the spine was performed revealing L4-L5 facet septic arthritis with evidence of associated myositis near L4-L5.  Neurosurgery recommended no surgical intervention.  Patient also developed right knee swelling secondary to MRSA septic arthritis status post I&D by Dr. Lyla Glassing on 7/30.  Brain MRI obtained 04/03/2021 revealing scattered infarcts with diffuse leptomeningeal enhancement concerning for meningitis.  Neurology recommended no LP.   Patient was eventually discharged on 8/4 to CIR after placement of a PICC line on intravenous vancomycin to be administered for 6 weeks (end date 9/12).  Patient was discharged from [place] on 8/12.  Since patient's discharge from [place] patient has followed up with infectious disease on 8/19 during which ID confirmed discontinuation of PICC on 8/12 after which oral  doxycycline could be initiated.  Patient explains that she has been receiving her intravenous antibiotics on schedule and as directed since her discharge several weeks ago.  Patient has noticed that in the past several days she has developed increasing redness at the site of PICC line insertion.  On 8/25, upon being visited by her home health nurse they were concerned about the degree of redness that was developing.  The home health nurse contacted her infectious disease provider who then instructed her to go to the emergency department for evaluation.  Upon further questioning, patient denies fevers, weakness, nausea, vomiting, arm pain, arm swelling shortness of breath or chest pain.  In the emergency department patient exhibited no evidence of SIRS criteria.  Ultrasound of the upper extremity revealed age-indeterminate DVT involving the right internal jugular vein, right subclavian vein and right axillary vein.  Patient was administered subcutaneous Lovenox.  ER provider discussed case with Dr. Linus Salmons with infectious disease as well.  Due to ongoing need for intravenous antibiotics, ER provider is requesting hospitalization for arrangement of a new PICC line to avoid disruption of antibiotics.  The hospitalist group has now been called to assess the patient for admission to the hospital.  Review of Systems:   Review of Systems  Musculoskeletal:  Positive for back pain.       Right arm swelling  Skin:        Right arm redness  All other systems reviewed and are negative.  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   Elevated C-reactive protein (  CRP) 01/15/2021   Encounter for long-term current use of high risk medication 01/29/2016   Essential hypertension 01/29/2016   Facet arthritis of lumbar  region 04/25/2021   Gastroesophageal reflux disease 01/29/2016   Intermittent asthma without complication 32/67/1245   Mixed hyperlipidemia 05/01/2019   Osteoarthritis of multiple joints 01/29/2016   Tobacco use disorder 12/02/2017   Type 2 diabetes mellitus without complication, without long-term current use of insulin ([place]) 01/30/2016   Last Assessment & Plan:  Formatting of this note might be different from the original. Relevant Hx: Course: Daily Update: Today's Plan:discussed with her that her sugars will increase and she has actively worked on her diet and diet and lost from 242 to 188 which I commend her on, she is to continue with her weight loss efforts despite taking this   Electronically signed by: Danie Chandler    Past Surgical History:  Procedure Laterality Date   APPENDECTOMY     BUBBLE STUDY  04/04/2021   Procedure: BUBBLE STUDY;  Surgeon: Geralynn Rile, MD;  Location: [place];  Service: Cardiovascular;;   BUNIONECTOMY Right    I & D EXTREMITY Right 04/05/2021   Procedure: IRRIGATION AND DEBRIDEMENT KNEE;  Surgeon: Rod Can, MD;  Location: [place];  Service: Orthopedics;  Laterality: Right;   LEFT HEART CATH AND CORONARY ANGIOGRAPHY N/A 03/12/2021   Procedure: LEFT HEART CATH AND CORONARY ANGIOGRAPHY;  Surgeon: Troy Sine, MD;  Location: [place] CV [facility];  Service: Cardiovascular;  Laterality: N/A;   NECK SURGERY     SMALL INTESTINE SURGERY     TEE WITHOUT CARDIOVERSION N/A 04/04/2021   Procedure: TRANSESOPHAGEAL ECHOCARDITRRROGRAM (TEE);  Surgeon: Geralynn Rile, MD;  Location: [place];  Service: Cardiovascular;  Laterality: N/A;   TONSILLECTOMY       reports that she has been smoking cigarettes. She has been smoking an average of .5 packs per day. She has never used smokeless tobacco. She reports current alcohol use of about 1.0 standard drink per week. She reports that she does not use drugs.  No Known Allergies  Family History   Problem Relation Age of Onset   Diabetes Mother    Heart attack Mother 35   Angina Father    Heart attack Father 1   Diabetes Paternal Grandmother      Prior to Admission medications   Medication Sig Start Date End Date Taking? Authorizing Provider  acetaminophen (TYLENOL) 325 MG tablet Take 1-2 tablets (325-650 mg total) by mouth every 4 (four) hours as needed for mild pain. 04/18/21  Yes Love, Ivan Anchors, PA-C  albuterol (VENTOLIN HFA) 108 (90 Base) MCG/ACT inhaler Inhale 2 puffs into the lungs every 6 (six) hours as needed for wheezing or shortness of breath. 12/02/17  Yes [provider]  aspirin 81 MG chewable tablet Chew 1 tablet (81 mg total) by mouth 2 (two) times daily. 04/18/21 06/02/21 Yes Love, Ivan Anchors, PA-C  Cinnamon 500 MG capsule Take 500 mg by mouth 2 (two) times a week.   Yes [provider]  fluticasone (FLONASE) 50 MCG/ACT nasal spray Place 2 sprays into both nostrils daily. Patient taking differently: Place 2 sprays into both nostrils daily as needed for allergies. 04/18/21  Yes Love, Ivan Anchors, PA-C  losartan (COZAAR) 50 MG tablet Take 1 tablet (50 mg total) by mouth at bedtime. Patient taking differently: Take 50 mg by mouth daily. 04/18/21  Yes Love, Ivan Anchors, PA-C  metFORMIN (GLUCOPHAGE-XR) 500 MG 24 hr tablet Take 500  mg by mouth in the morning and at bedtime.   Yes [provider]  methocarbamol (ROBAXIN) 500 MG tablet Take 1 tablet (500 mg total) by mouth 4 (four) times daily. 04/18/21  Yes Love, Ivan Anchors, PA-C  metoprolol tartrate (LOPRESSOR) 50 MG tablet Take 1 tablet (50 mg total) by mouth 2 (two) times daily. 04/18/21  Yes Love, Ivan Anchors, PA-C  morphine ([name]) 30 MG 12 hr tablet Take 1 tablet (30 mg total) by mouth every 12 (twelve) hours. 04/22/21  Yes Meredith Staggers, MD  Multiple Vitamin (MULTIVITAMIN) capsule Take 1 capsule by mouth daily as needed (nutrition).   Yes [provider]  nitroGLYCERIN (NITROSTAT) 0.4 MG SL  tablet Place 1 tablet (0.4 mg total) under the tongue every 5 (five) minutes as needed. 03/06/21 06/04/21 Yes Richardo Priest, MD  Omega-3 1000 MG CAPS Take 1,000 mg by mouth daily.   Yes [provider]  oxyCODONE (OXY IR/ROXICODONE) 5 MG immediate release tablet Take 1 tablet (5 mg total) by mouth 2 (two) times daily as needed for moderate pain or severe pain. 04/22/21  Yes Meredith Staggers, MD  pravastatin (PRAVACHOL) 20 MG tablet Take 20 mg by mouth every evening.   Yes [provider]  SEMGLEE, YFGN, 100 UNIT/ML Pen Inject 14 Units into the skin in the morning. 04/18/21  Yes [provider]  vancomycin IVPB Inject 1,000 mg into the vein every 12 (twelve) hours. Indication:  MRSA bacteremia and suspected endocarditis First Dose: Yes Last Day of Therapy:  05/19/21 Labs - Sunday/Monday:  CBC/D, BMP, and vancomycin trough. Labs - Thursday:  BMP and vancomycin trough Labs - Every other week:  ESR and CRP Method of administration:Elastomeric Method of administration may be changed at the discretion of the patient and/or caregiver's ability to self-administer the medication ordered. 04/15/21 05/29/21 Yes Love, Ivan Anchors, PA-C  amLODipine (NORVASC) 10 MG tablet Take 1 tablet (10 mg total) by mouth daily. Patient not taking: No sig reported 04/18/21   Love, Ivan Anchors, PA-C  docusate sodium (COLACE) 100 MG capsule Take 1 capsule (100 mg total) by mouth 2 (two) times daily. Patient not taking: No sig reported 04/18/21   Love, Ivan Anchors, PA-C  doxycycline (VIBRA-TABS) 100 MG tablet Take 1 tablet (100 mg total) by mouth 2 (two) times daily. 04/25/21   Truman Hayward, MD  Insulin Glargine [facility]) 100 UNIT/ML Inject 22 Units into the skin daily. Patient not taking: No sig reported 04/18/21   Love, Ivan Anchors, PA-C  insulin lispro (HUMALOG) 100 UNIT/ML KwikPen Inject 9 Units into the skin 3 (three) times daily. Patient not taking: No sig reported 04/18/21   Love, Ivan Anchors, PA-C   Insulin Pen Needle 32G X 4 MM MISC Use as directed 4 (four) times daily. 04/18/21   Love, Ivan Anchors, PA-C  melatonin 5 MG TABS Take 1 tablet (5 mg total) by mouth at bedtime. Patient not taking: No sig reported 04/18/21   Love, Ivan Anchors, PA-C  menthol-cetylpyridinium (CEPACOL) 3 MG lozenge Take 1 lozenge (3 mg total) by mouth as needed for sore throat. Patient not taking: No sig reported 04/18/21   Love, Ivan Anchors, PA-C  phenol (CHLORASEPTIC) 1.4 % LIQD Use as directed 1 spray in the mouth or throat as needed for throat irritation / pain. 04/18/21   Love, Ivan Anchors, PA-C  polyethylene glycol powder (GLYCOLAX/MIRALAX) 17 GM/SCOOP powder Dissolve 1 capful (17 g) in water and drink 2 (two) times daily.  Patient not taking: No sig reported 04/18/21   Love, Ivan Anchors, PA-C  senna (SENOKOT) 8.6 MG TABS tablet Take 2 tablets (17.2 mg total) by mouth 2 (two) times daily. Patient not taking: No sig reported 04/18/21   Bary Leriche, [place]    Physical Exam: Vitals:   05/01/21 1716 05/01/21 1903  BP: (!) 183/95 (!) 165/75  Pulse: 89 82  Resp: 16 16  Temp: 99 F (37.2 C)   TempSrc: Oral   SpO2: 99% 99%    Constitutional: Awake alert and oriented x3, no associated distress.   Skin: Notable significant redness and induration at the site of right upper extremity PICC line insertion.  No other rashes or lesions seen.  Good skin turgor otherwise.. Eyes: Pupils are equally reactive to light.  No evidence of scleral icterus or conjunctival pallor.  ENMT: Moist mucous membranes noted.  Posterior pharynx clear of any exudate or lesions.   Neck: normal, supple, no masses, no thyromegaly.  No evidence of jugular venous distension.   Respiratory: clear to auscultation bilaterally, no wheezing, no crackles. Normal respiratory effort. No accessory muscle use.  Cardiovascular: Regular rate and rhythm, 3 out of 6 systolic murmur noted.  No extremity edema. 2+ pedal pulses. No carotid bruits.  Chest:   Nontender without  crepitus or deformity.   Back:   Nontender without crepitus or deformity. Abdomen: Abdomen is soft and nontender.  No evidence of intra-abdominal masses.  Positive bowel sounds noted in all quadrants.   Musculoskeletal: Notable tenderness at site of PICC line insertion with skin examination findings as noted above.   Good ROM, no contractures. Normal muscle tone.  Neurologic: CN 2-12 grossly intact. Sensation intact.  Patient moving all 4 extremities spontaneously.  Patient is following all commands.  Patient is responsive to verbal stimuli.   Psychiatric: Patient exhibits normal mood with appropriate affect.  Patient seems to possess insight as to their current situation.     Labs on Admission: I have personally reviewed following labs and imaging studies -   CBC: Recent Labs  Lab 05/01/21 1733  WBC 9.6  NEUTROABS 6.3  HGB 10.2*  HCT 32.7*  MCV 85.2  PLT 846*   Basic Metabolic Panel: Recent Labs  Lab 05/01/21 1733  NA 139  K 2.8*  CL 102  CO2 30  GLUCOSE 170*  BUN <5*  CREATININE 0.75  CALCIUM 8.5*   GFR: Estimated Creatinine Clearance: 78.4 mL/min (by C-G formula based on SCr of 0.75 mg/dL). Liver Function Tests: Recent Labs  Lab 05/01/21 1733  AST 21  ALT 19  ALKPHOS 111  BILITOT 0.4  PROT 6.3*  ALBUMIN 2.3*   No results for input(s): LIPASE, AMYLASE in the last 168 hours. No results for input(s): AMMONIA in the last 168 hours. Coagulation Profile: Recent Labs  Lab 05/01/21 1733  INR 1.0   Cardiac Enzymes: No results for input(s): CKTOTAL, CKMB, CKMBINDEX, TROPONINI in the last 168 hours. BNP (last 3 results) No results for input(s): PROBNP in the last 8760 hours. HbA1C: No results for input(s): HGBA1C in the last 72 hours. CBG: No results for input(s): GLUCAP in the last 168 hours. Lipid Profile: No results for input(s): CHOL, HDL, LDLCALC, TRIG, CHOLHDL, LDLDIRECT in the last 72 hours. Thyroid Function Tests: No results for input(s): TSH,  T4TOTAL, FREET4, T3FREE, THYROIDAB in the last 72 hours. Anemia Panel: No results for input(s): VITAMINB12, FOLATE, FERRITIN, TIBC, IRON, RETICCTPCT in the last 72 hours. Urine analysis:    Component  Value Date/Time   COLORURINE STRAW (A) 05/01/2021 2034   APPEARANCEUR CLEAR 05/01/2021 2034   LABSPEC 1.005 05/01/2021 2034   PHURINE 8.0 05/01/2021 2034   GLUCOSEU 50 (A) 05/01/2021 2034   HGBUR NEGATIVE 05/01/2021 2034   BILIRUBINUR NEGATIVE 05/01/2021 2034   KETONESUR 5 (A) 05/01/2021 2034   PROTEINUR NEGATIVE 05/01/2021 2034   NITRITE NEGATIVE 05/01/2021 2034   LEUKOCYTESUR TRACE (A) 05/01/2021 2034    Radiological Exams on Admission - Personally Reviewed: DG Chest 1 View  Result Date: 05/01/2021 CLINICAL DATA:  Infected left-sided PICC line. EXAM: CHEST  1 VIEW COMPARISON:  03/30/2021 FINDINGS: Right PICC line tip at low SVC. Cervical spine fixation. Midline trachea. Borderline cardiomegaly. Tortuous thoracic aorta. No pleural effusion or pneumothorax. Clear lungs. IMPRESSION: No acute cardiopulmonary disease. Electronically Signed   By: Abigail Miyamoto M.D.   On: 05/01/2021 18:19   UE Venous Duplex ([facility] and [facility] ONLY)  Result Date: 05/01/2021 UPPER VENOUS STUDY  Patient Name:  NEKEYA BRISKI  Date of Exam:   05/01/2021 Medical Rec #: [id number]        Accession #:    [id number] Date of Birth: 17-Jul-1956        Patient Gender: F Patient Age:   63 years Exam Location:  [facility] Procedure:      VAS [place] UPPER EXTREMITY VENOUS DUPLEX Referring Phys: Vicente Males WRIGHT --------------------------------------------------------------------------------  Indications: Pain, swelling, discoloration at PICC site. Limitations: Bandages and poor ultrasound/tissue interface. Comparison Study: No prior study Performing Technologist: Maudry Mayhew MHA, RDMS, RVT, RDCS  Examination Guidelines: A complete evaluation includes B-mode imaging, spectral Doppler, color Doppler, and power Doppler as needed of all  accessible portions of each vessel. Bilateral testing is considered an integral part of a complete examination. Limited examinations for reoccurring indications may be performed as noted.  Right Findings: +----------+------------+---------+-----------+----------+---------------------+ RIGHT     CompressiblePhasicitySpontaneousProperties       Summary        +----------+------------+---------+-----------+----------+---------------------+ IJV         Partial      Yes       Yes                Age Indeterminate   +----------+------------+---------+-----------+----------+---------------------+ Subclavian    None       Yes       Yes                Age indeterminate                                                         surrounding PICC    +----------+------------+---------+-----------+----------+---------------------+ Axillary      None       Yes       Yes                Age indeterminate                                                         surrounding PICC    +----------+------------+---------+-----------+----------+---------------------+ Radial        Full                                                        +----------+------------+---------+-----------+----------+---------------------+  Ulnar         Full                                                        +----------+------------+---------+-----------+----------+---------------------+ Cephalic      None                                    Age indeterminate                                                         surrounding PICC    +----------+------------+---------+-----------+----------+---------------------+ Basilic                                                Not visualized     +----------+------------+---------+-----------+----------+---------------------+  Left Findings: +----------+------------+---------+-----------+----------+-------+ LEFT       CompressiblePhasicitySpontaneousPropertiesSummary +----------+------------+---------+-----------+----------+-------+ Subclavian               Yes       Yes                      +----------+------------+---------+-----------+----------+-------+  Summary:  Right: Findings consistent with age indeterminate deep vein thrombosis involving the right internal jugular vein, right subclavian vein and right axillary vein. Thrombus is surrounding the PICC line in the subclavian and axillary veins.  *See table(s) above for measurements and observations.     Preliminary     EKG: Personally reviewed.  Rhythm is normal sinus rhythm  with heart rate of 82 bpm.  No dynamic ST segment changes appreciated.  Assessment/Plan Principal Problem:   Acute deep vein thrombosis (DVT) of right upper extremity (HCC)  Upper extremity ultrasound revealing findings consistent DVT involving the right internal jugular vein right subclavian vein and right axillary vein including a thrombus surrounding the PICC line in the subclavian and axillary veins. Patient reports that as of late home health nursing has noted that infusions of antibiotic have taken much longer than usual While based on current literature thrombus related to PICC lines does not necessitate removal of the PICC, especially if the PICC is operating appropriately.  However, considering the significant redness and tenderness at the PICC site I believe that removal of this PICC and placement of a new PICC prior to discharge is warranted. Will place orders for PICC exchange with IR to evaluate patient for consideration of placing a new PICC at another site Patient has been initiated on subcutaneous Lovenox injections by the emergency department staff which I will continue at this time.  Prior to discharge patient can likely be transition to an oral NOAC such as Eliquis or Xarelto which will need to be continued for minimum of 3 months. I do not believe that the  redness at the site of PICC insertion is related to a new cellulitis or soft tissue infection.  Regardless, the intravenous vancomycin should cover any potential infection anyway.  Active Problems:   MRSA bacteremia complicated  by right knee septic arthritis, L4-L5 facet septic arthritis and meningitis  Continuing same regimen of intravenous vancomycin that has been previously ordered. Patient will continue to receive intravenous antibiotics after discharge until date of completion on 9/12. Infectious disease was called on arrival to the emergency department by the emergency room provider.  We will touch base with infectious disease during dayshift for further guidance.    Nicotine dependence, cigarettes, uncomplicated  Counseling patient daily on cessation, patient continues to smoke 5 cigarettes daily    Essential hypertension  Continue home regimen of antihypertensive therapy    Coronary artery disease of native artery of native heart with stable angina pectoris ([place])  Significant multivessel disease based on recent cardiac catheterization on 7/6. Continuing medical management with beta-blocker, new increased statin therapy of atorvastatin 80 mg daily Patient now on full dose anticoagulation and therefore antiplatelet therapy not necessary. Patient is chest pain-free Monitoring patient on telemetry    Type 2 diabetes mellitus with hyperglycemia, with long-term current use of insulin (HCC)  Continue home regimen of basal insulin therapy Accu-Cheks before every meal and nightly with sliding scale insulin Most recent hemoglobin A1c in July is 9.8%.    COPD (chronic obstructive pulmonary disease) (HCC)  No evidence of COPD exacerbation As needed bronchodilator therapy for shortness of breath and wheezing    Mixed diabetic hyperlipidemia associated with type 2 diabetes mellitus ([place])  Review of cardiology's notes during recent cardiac catheterization revealed a recommendation to  transition patient to atorvastatin 80 mg daily.  Order has been placed.   Code Status:  Full code Family Communication: deferred   Status is: Observation  The patient remains OBS appropriate and will d/c before 2 midnights.  Dispo: The patient is from: Home              Anticipated d/c is to: Home              Patient currently is not medically stable to d/c.   Difficult to place patient No        Vernelle Emerald MD [facility] Pager [phone]  If 7PM-7AM, please contact night-coverage [email or web address] Use universal [place] password for that web site. If you do not have the password, please call the hospital operator.  05/01/2021, 9:13 PM

## 2021-05-01 NOTE — Progress Notes (Signed)
ANTICOAGULATION CONSULT NOTE - Initial Consult  Pharmacy Consult for Lovenox Indication: DVT  No Known Allergies  Patient Measurements:   Vital Signs: Temp: 99 F (37.2 C) (08/25 1716) Temp Source: Oral (08/25 1716) BP: 165/75 (08/25 1903) Pulse Rate: 82 (08/25 1903)  Labs: Recent Labs    05/01/21 1733  HGB 10.2*  HCT 32.7*  PLT 508*  APTT 31  LABPROT 13.1  INR 1.0  CREATININE 0.75    Estimated Creatinine Clearance: 78.4 mL/min (by C-G formula based on SCr of 0.75 mg/dL).   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   Elevated C-reactive protein (CRP) 01/15/2021   Encounter for long-term current use of high risk medication 01/29/2016   Essential hypertension 01/29/2016   Facet arthritis of lumbar region 04/25/2021   Gastroesophageal reflux disease 01/29/2016   Intermittent asthma without complication 01/13/2021   Mixed hyperlipidemia 05/01/2019   Osteoarthritis of multiple joints 01/29/2016   Shortness of breath 01/13/2021   Tobacco use disorder 12/02/2017   Type 2 diabetes mellitus without complication, without long-term current use of insulin (HCC) 01/30/2016   Last Assessment & Plan:  Formatting of this note might be different from the original. Relevant Hx: Course: Daily Update: Today's Plan:discussed with her that her sugars will increase and she has actively worked on her diet and diet and lost from 242 to 188 which I commend her on, she is to continue with her weight loss efforts despite taking this   Electronically signed by: Sandi Mealy    Medications:  (Not in a hospital admission)  Scheduled:   enoxaparin (LOVENOX) injection  90 mg Subcutaneous Q12H   methocarbamol  500 mg Oral Once   metoprolol tartrate  50 mg Oral Once   morphine  30 mg Oral  Once   pravastatin  20 mg Oral Once   Infusions:   Assessment: 63 yof with a history of HTN, COPD, T2DM, HLD, GERD, asthma, 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 Patient presenting with RUE redness and discomfort surrounding PICC line.  7/24-8/4: MRSA bacteremia admission - no IE at that time there was evidence of associated myositis near L4-L5; noted for MRSA septic arthritis s/p I&D on 7/30 8/4 discharged to CIR for PICC placement plan for Vanco for 6 wks (EOT 9/12)  [place] w/ DVT of right internal jugular vein, rt subclavian, and rt axillary vein  Patient is not on anticoagulation prior to arrival. Hgb 10.2; plt 508; CrCl 78.4  Goal of Therapy:  Monitor platelets by anticoagulation protocol: Yes   Plan:  Lovenox 90 mg q12h Monitor for s/s of bleeding Monitor renal function and CBC  Katie Serrano 05/01/2021,7:36 PM

## 2021-05-01 NOTE — ED Provider Notes (Signed)
[facility] EMERGENCY DEPARTMENT Provider Note   CSN: [id number] Arrival date & time: 05/01/21  1708     History Chief Complaint  Patient presents with  . Vascular Access Problem    Katie Serrano is a 65 y.o. female.  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  Vancomycin through a right upper extremity PICC line.  Her husband states that the antibiotic took 2 hours to infuse rather than 90 minutes.  Otherwise, it has been working well, and her last dose through this line was this morning.  She is scheduled to be on this antibiotic until September 12. Health noticed that she has some erythema around the entry site, and they directed her to the emergency department.  She is otherwise feeling fine.  No systemic symptoms, and other problems are at baseline.  The history is provided by the patient.  Muscle Pain This is a new problem. The current episode started 12 to 24 hours ago. The problem occurs constantly. The problem has not changed since onset.Pertinent negatives include no chest pain, no abdominal pain, no headaches and no shortness of breath. Nothing aggravates the symptoms. Nothing relieves the symptoms. She has tried nothing for the symptoms. The treatment provided no relief.      Past Medical History:  Diagnosis Date  . Bed sore 04/25/2021  . Class 1 obesity due to excess calories with serious comorbidity and body mass index (BMI) of 30.0 to 30.9 in adult 06/12/2019  . Diskitis 04/25/2021  . 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 Elevated C-reactive protein (CRP) 01/15/2021  . Encounter for long-term current use of high risk medication 01/29/2016  . Essential hypertension 01/29/2016  . Facet arthritis of lumbar region 04/25/2021  . Gastroesophageal reflux disease  01/29/2016  . Intermittent asthma without complication 12/07/7060  . Mixed hyperlipidemia 05/01/2019  . Osteoarthritis of multiple joints 01/29/2016  . Shortness of breath 01/13/2021  . Tobacco use disorder 12/02/2017  . Type 2 diabetes mellitus without complication, without long-term current use of insulin ([place]) 01/30/2016   Last Assessment & Plan:  Formatting of this note might be different from the original. Relevant Hx: Course: Daily Update: Today's Plan:discussed with her that her sugars will increase and she has actively worked on her diet and diet and lost from 242 to 188 which I commend her on, she is to continue with her weight loss efforts despite taking this   Electronically signed by: Danie Chandler    Patient Active Problem List   Diagnosis Date Noted  . Diskitis 04/25/2021  . Bed sore 04/25/2021  . Facet arthritis of lumbar region 04/25/2021  . Acute blood loss anemia 04/18/2021  . Leukocytosis (leucocytosis) 04/18/2021  . Uncontrolled type 2 diabetes mellitus with hyperglycemia ([place])   . Postoperative pain   . Myositis 04/10/2021  . Cerebral embolism with cerebral infarction 04/05/2021  . Acute pain of right knee   . Skin lesion   . Vertebral osteophyte   . Septic embolism ([place])   . Acute bacterial endocarditis   . MRSA bacteremia   . Epidural abscess   . Weakness of both lower extremities   . Acute pyelonephritis 04/01/2021  . SIRS (systemic inflammatory response syndrome) ([place]) 03/31/2021  . Type 2 diabetes mellitus with hyperglycemia ([place]) 03/31/2021  . Hyponatremia 03/31/2021  . Coronary artery disease of native artery  of native heart with stable angina pectoris ([place])   . Abnormal cardiac CT angiography   . Elevated C-reactive protein (CRP) 01/15/2021  . Intermittent asthma without complication 17/61/6073  . Shortness of breath 01/13/2021  . Cigarette smoker 07/17/2019  . 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 Class 1 obesity due to excess calories with serious  comorbidity and body mass index (BMI) of 30.0 to 30.9 in adult 06/12/2019  . Mixed hyperlipidemia 05/01/2019  . Tobacco use disorder 12/02/2017  . Encounter for long-term current use of high risk medication 01/29/2016  . Essential hypertension 01/29/2016  . Gastroesophageal reflux disease 01/29/2016  . Osteoarthritis of multiple joints 01/29/2016    Past Surgical History:  Procedure Laterality Date  . APPENDECTOMY    . BUBBLE STUDY  04/04/2021   Procedure: BUBBLE STUDY;  Surgeon: Geralynn Rile, MD;  Location: [facility];  Service: Cardiovascular;;  . BUNIONECTOMY Right   . I & D EXTREMITY Right 04/05/2021   Procedure: IRRIGATION AND DEBRIDEMENT KNEE;  Surgeon: Rod Can, MD;  Location: [facility];  Service: Orthopedics;  Laterality: Right;  . LEFT HEART CATH AND CORONARY ANGIOGRAPHY N/A 03/12/2021   Procedure: LEFT HEART CATH AND CORONARY ANGIOGRAPHY;  Surgeon: Troy Sine, MD;  Location: [facility];  Service: Cardiovascular;  Laterality: N/A;  . NECK SURGERY    . SMALL INTESTINE SURGERY    . TEE WITHOUT CARDIOVERSION N/A 04/04/2021   Procedure: TRANSESOPHAGEAL ECHOCARDITRRROGRAM (TEE);  Surgeon: Geralynn Rile, MD;  Location: [facility];  Service: Cardiovascular;  Laterality: N/A;  . TONSILLECTOMY       OB History   No obstetric history on file.     Family History  Problem Relation Age of Onset  . Diabetes Mother   . Heart attack Mother 61  . Angina Father   . Heart attack Father 82  . Diabetes Paternal Grandmother     Social History   Tobacco Use  . Smoking status: Every Day    Packs/day: 0.50    Types: Cigarettes  . Smokeless tobacco: Never  Vaping Use  . Vaping Use: Never used  Substance Use Topics  . Alcohol use: Yes    Alcohol/week: 1.0 standard drink    Types: 1 Glasses of wine per week    Comment: Socially  . Drug use: Never    Home Medications Prior to Admission medications   Medication Sig Start Date End Date Taking?  Authorizing Provider  acetaminophen (TYLENOL) 325 MG tablet Take 1-2 tablets (325-650 mg total) by mouth every 4 (four) hours as needed for mild pain. 04/18/21   Love, Ivan Anchors, PA-C  albuterol (VENTOLIN HFA) 108 (90 Base) MCG/ACT inhaler Inhale 2 puffs into the lungs every 6 (six) hours as needed for wheezing or shortness of breath. 12/02/17   [provider]  amLODipine (NORVASC) 10 MG tablet Take 1 tablet (10 mg total) by mouth daily. 04/18/21   Love, Ivan Anchors, PA-C  aspirin 81 MG chewable tablet Chew 1 tablet (81 mg total) by mouth 2 (two) times daily. 04/18/21 06/02/21  Love, Ivan Anchors, PA-C  Cinnamon 500 MG capsule Take 500 mg by mouth daily.    [provider]  docusate sodium (COLACE) 100 MG capsule Take 1 capsule (100 mg total) by mouth 2 (two) times daily. Patient not taking: Reported on 04/25/2021 04/18/21   Love, Ivan Anchors, PA-C  doxycycline (VIBRA-TABS) 100 MG tablet Take 1 tablet (100 mg total) by mouth 2 (two) times daily. 04/25/21   Lucianne Lei  Dam, Lavell Islam, MD  fluticasone [facility]) 50 MCG/ACT nasal spray Place 2 sprays into both nostrils daily. 04/18/21   Love, Ivan Anchors, PA-C  Insulin Glargine (BASAGLAR KWIKPEN) 100 UNIT/ML Inject 22 Units into the skin daily. 04/18/21   Love, Ivan Anchors, PA-C  insulin lispro (HUMALOG) 100 UNIT/ML KwikPen Inject 9 Units into the skin 3 (three) times daily. Patient not taking: Reported on 04/25/2021 04/18/21   Love, Ivan Anchors, PA-C  Insulin Pen Needle 32G X 4 MM MISC Use as directed 4 (four) times daily. 04/18/21   Love, Ivan Anchors, PA-C  losartan (COZAAR) 50 MG tablet Take 1 tablet (50 mg total) by mouth at bedtime. 04/18/21   Love, Ivan Anchors, PA-C  melatonin 5 MG TABS Take 1 tablet (5 mg total) by mouth at bedtime. 04/18/21   Love, Ivan Anchors, PA-C  menthol-cetylpyridinium (CEPACOL) 3 MG lozenge Take 1 lozenge (3 mg total) by mouth as needed for sore throat. 04/18/21   Love, Ivan Anchors, PA-C  methocarbamol (ROBAXIN) 500 MG tablet Take 1 tablet (500 mg total) by  mouth 4 (four) times daily. 04/18/21   Love, Ivan Anchors, PA-C  metoprolol tartrate (LOPRESSOR) 50 MG tablet Take 1 tablet (50 mg total) by mouth 2 (two) times daily. 04/18/21   Love, Ivan Anchors, PA-C  morphine ([name]) 30 MG 12 hr tablet Take 1 tablet (30 mg total) by mouth every 12 (twelve) hours. 04/22/21   Meredith Staggers, MD  Multiple Vitamin (MULTIVITAMIN) capsule Take 1 capsule by mouth daily.    [provider]  nitroGLYCERIN (NITROSTAT) 0.4 MG SL tablet Place 1 tablet (0.4 mg total) under the tongue every 5 (five) minutes as needed. 03/06/21 06/04/21  Richardo Priest, MD  Omega-3 1000 MG CAPS Take 1,000 mg by mouth daily.    [provider]  oxyCODONE (OXY IR/ROXICODONE) 5 MG immediate release tablet Take 1 tablet (5 mg total) by mouth 2 (two) times daily as needed for moderate pain or severe pain. 04/22/21   Meredith Staggers, MD  phenol (CHLORASEPTIC) 1.4 % LIQD Use as directed 1 spray in the mouth or throat as needed for throat irritation / pain. Patient not taking: Reported on 04/25/2021 04/18/21   Love, Ivan Anchors, PA-C  polyethylene glycol powder (GLYCOLAX/MIRALAX) 17 GM/SCOOP powder Dissolve 1 capful (17 g) in water and drink 2 (two) times daily. Patient not taking: Reported on 04/25/2021 04/18/21   Love, Ivan Anchors, PA-C  SEMGLEE, YFGN, 100 UNIT/ML Pen SMARTSIG:22 Unit(s) SUB-Q Daily 04/18/21   [provider]  senna (SENOKOT) 8.6 MG TABS tablet Take 2 tablets (17.2 mg total) by mouth 2 (two) times daily. Patient not taking: Reported on 04/25/2021 04/18/21   Bary Leriche, PA-C  vancomycin IVPB Inject 1,000 mg into the vein every 12 (twelve) hours. Indication:  MRSA bacteremia and suspected endocarditis First Dose: Yes Last Day of Therapy:  05/19/21 Labs - Sunday/Monday:  CBC/D, BMP, and vancomycin trough. Labs - Thursday:  BMP and vancomycin trough Labs - Every other week:  ESR and CRP Method of administration:Elastomeric Method of administration may be changed at the  discretion of the patient and/or caregiver's ability to self-administer the medication ordered. 04/15/21 05/29/21  Bary Leriche, PA-C  pravastatin (PRAVACHOL) 20 MG tablet Take 1 tablet (20 mg total) by mouth every evening. 02/19/21 03/12/21  Richardo Priest, MD    Allergies    Patient has no known allergies.  Review of Systems   Review of Systems  Constitutional:  Negative for chills and fever.  HENT:  Negative for ear pain and sore throat.   Eyes:  Negative for pain and visual disturbance.  Respiratory:  Negative for cough and shortness of breath.   Cardiovascular:  Negative for chest pain and palpitations.  Gastrointestinal:  Negative for abdominal pain and vomiting.  Genitourinary:  Negative for dysuria and hematuria.  Musculoskeletal:  Negative for arthralgias and back pain.  Skin:  Positive for color change. Negative for rash.  Neurological:  Negative for seizures, syncope and headaches.  All other systems reviewed and are negative.  Physical Exam Updated Vital Signs BP (!) 183/95 (BP Location: Left Arm)   Pulse 89   Temp 99 F (37.2 C) (Oral)   Resp 16   LMP  (LMP Unknown)   SpO2 99%   Physical Exam Vitals and nursing note reviewed.  HENT:     Head: Normocephalic and atraumatic.  Eyes:     General: No scleral icterus. Pulmonary:     Effort: Pulmonary effort is normal. No respiratory distress.  Musculoskeletal:     Cervical back: Normal range of motion.     Comments: PICC line at the right upper extremity is in place.  There is some superficial erythema right around the insertion site but no tenderness or fluctuance of the surrounding skin.  No drainage noted.  No firmness or palpable venous cords are noted at the upper extremity.  The extremity itself is warm and well-perfused.  Sensation is normal throughout the entire upper extremity.  Skin:    General: Skin is warm and dry.  Neurological:     General: No focal deficit present.     Mental Status: She is alert and  oriented to person, place, and time.  Psychiatric:        Mood and Affect: Mood normal.     ED Results / Procedures / Treatments   Labs (all labs ordered are listed, but only abnormal results are displayed) Labs Reviewed  COMPREHENSIVE METABOLIC PANEL - Abnormal; Notable for the following components:      Result Value   Potassium 2.8 (*)    Glucose, Bld 170 (*)    BUN <5 (*)    Calcium 8.5 (*)    Total Protein 6.3 (*)    Albumin 2.3 (*)    All other components within normal limits  CBC WITH DIFFERENTIAL/PLATELET - Abnormal; Notable for the following components:   RBC 3.84 (*)    Hemoglobin 10.2 (*)    HCT 32.7 (*)    Platelets 508 (*)    Monocytes Absolute 1.1 (*)    Eosinophils Absolute 0.9 (*)    All other components within normal limits  URINALYSIS, ROUTINE W REFLEX MICROSCOPIC - Abnormal; Notable for the following components:   Color, Urine STRAW (*)    Glucose, UA 50 (*)    Ketones, ur 5 (*)    Leukocytes,Ua TRACE (*)    Bacteria, UA RARE (*)    All other components within normal limits  CULTURE, BLOOD (SINGLE)  SARS CORONAVIRUS 2 (TAT 6-24 HRS)  LACTIC ACID, PLASMA  PROTIME-INR  APTT  LACTIC ACID, PLASMA  CBC  CBC WITH DIFFERENTIAL/PLATELET  APTT  PROTIME-INR  COMPREHENSIVE METABOLIC PANEL  MAGNESIUM    EKG EKG Interpretation  Date/Time:  Thursday May 01 2021 18:30:28 EDT Ventricular Rate:  82 PR Interval:  179 QRS Duration: 93 QT Interval:  383 QTC Calculation: 448 R Axis:   52 Text Interpretation: Sinus rhythm Probable left atrial enlargement  Borderline repolarization abnormality normal axis Confirmed by Lorre Munroe ([id number]) on 05/01/2021 7:09:56 PM  Radiology DG Chest 1 View  Result Date: 05/01/2021 CLINICAL DATA:  Infected left-sided PICC line. EXAM: CHEST  1 VIEW COMPARISON:  03/30/2021 FINDINGS: Right PICC line tip at low SVC. Cervical spine fixation. Midline trachea. Borderline cardiomegaly. Tortuous thoracic aorta. No pleural effusion or  pneumothorax. Clear lungs. IMPRESSION: No acute cardiopulmonary disease. Electronically Signed   By: Abigail Miyamoto M.D.   On: 05/01/2021 18:19   UE Venous Duplex ([facility] and [facility] ONLY)  Result Date: 05/01/2021 UPPER VENOUS STUDY  Patient Name:  Katie Serrano  Date of Exam:   05/01/2021 Medical Rec #: [id number]        Accession #:    [id number] Date of Birth: 1956-07-02        Patient Gender: F Patient Age:   65 years Exam Location:  [facility] Procedure:      VAS [place] UPPER EXTREMITY VENOUS DUPLEX Referring Phys: Vicente Males WRIGHT --------------------------------------------------------------------------------  Indications: Pain, swelling, discoloration at PICC site. Limitations: Bandages and poor ultrasound/tissue interface. Comparison Study: No prior study Performing Technologist: Maudry Mayhew MHA, RDMS, RVT, RDCS  Examination Guidelines: A complete evaluation includes B-mode imaging, spectral Doppler, color Doppler, and power Doppler as needed of all accessible portions of each vessel. Bilateral testing is considered an integral part of a complete examination. Limited examinations for reoccurring indications may be performed as noted.  Right Findings: +----------+------------+---------+-----------+----------+---------------------+ RIGHT     CompressiblePhasicitySpontaneousProperties       Summary        +----------+------------+---------+-----------+----------+---------------------+ IJV         Partial      Yes       Yes                Age Indeterminate   +----------+------------+---------+-----------+----------+---------------------+ Subclavian    None       Yes       Yes                Age indeterminate                                                         surrounding PICC    +----------+------------+---------+-----------+----------+---------------------+ Axillary      None       Yes       Yes                Age indeterminate                                                          surrounding PICC    +----------+------------+---------+-----------+----------+---------------------+ Radial        Full                                                        +----------+------------+---------+-----------+----------+---------------------+ Ulnar         Full                                                        +----------+------------+---------+-----------+----------+---------------------+  Cephalic      None                                    Age indeterminate                                                         surrounding PICC    +----------+------------+---------+-----------+----------+---------------------+ Basilic                                                Not visualized     +----------+------------+---------+-----------+----------+---------------------+  Left Findings: +----------+------------+---------+-----------+----------+-------+ LEFT      CompressiblePhasicitySpontaneousPropertiesSummary +----------+------------+---------+-----------+----------+-------+ Subclavian               Yes       Yes                      +----------+------------+---------+-----------+----------+-------+  Summary:  Right: Findings consistent with age indeterminate deep vein thrombosis involving the right internal jugular vein, right subclavian vein and right axillary vein. Thrombus is surrounding the PICC line in the subclavian and axillary veins.  *See table(s) above for measurements and observations.     Preliminary     Procedures Procedures   Medications Ordered in ED Medications - No data to display  ED Course  I have reviewed the triage vital signs and the nursing notes.  Pertinent labs & imaging results that were available during my care of the patient were reviewed by me and considered in my medical decision making (see chart for details).  Clinical Course as of 05/01/21 2151  Thu May 01, 2021  8416 I spoke with ID.  Seems like a local infection. OK to f/u in clinic. (This conversation was prior to results of ultrasound.) [AW]  2017 I spoke with Dr. Cyd Silence regarding admission.  [AW]    Clinical Course User Index [AW] Arnaldo Natal, MD   MDM Rules/Calculators/A&P                           SHEYLI HORWITZ has had a recent complicated admission during which time she had MRSA bacteremia with multiple seeded sites including the heart and CNS.  She was discharged home with vancomycin per right sided PICC line, and she presents with redness of the PICC line site.  Initial differential diagnosis was mainly DVT versus infection.  White count is normal, and she is not systemically ill.  Ultrasound confirms DVT.  She was given a dose of Lovenox and will be admitted to the hospital given the fact that she requires twice daily IV vancomycin until September 12, anticoagulation, and new PICC line access. Final Clinical Impression(s) / ED Diagnoses Final diagnoses:  Acute deep vein thrombosis (DVT) of right upper extremity, unspecified vein (HCC)  Needs peripherally inserted central catheter (PICC)  Acute bacterial endocarditis  Epidural abscess    Rx / DC Orders ED Discharge Orders     None        Arnaldo Natal, MD 05/01/21 2151

## 2021-05-01 NOTE — ED Notes (Signed)
Pt states she took Motrin on the way to ED for discomfort, denies fever/chills at home

## 2021-05-01 NOTE — Progress Notes (Signed)
Right upper extremity venous duplex completed. Refer to "CV Proc" under chart review to view preliminary results.  05/01/2021 7:27 PM Eula Fried., MHA, RVT, RDCS, RDMS

## 2021-05-01 NOTE — Progress Notes (Signed)
Pharmacy Antibiotic Note  Katie Serrano is a 65 y.o. female admitted on 05/01/2021 with bacteremia.  Pharmacy has been consulted for vancomycin dosing.  7/24-8/4: MRSA bacteremia admission - no IE at that time there was evidence of associated myositis near L4-L5; noted for MRSA septic arthritis s/p I&D on 7/30  8/4 discharged to CIR for PICC placement plan for Vanco for 6 wks (EOT 9/12)  8/19: [facility] Note: Vanco 1g q12h through 9/12 then transition to doxy  SCr 0.75 which is baseline; WBC 9.6  Next vancomycin dose due 8/25 pm.  Plan: -Continue vancomycin 1g q12h unless change in renal function -Would plan for confirmatory levels in about 4 doses as slightly off schedule for today -Renal function is stable - continue to monitor closely -F/u repeat cultures, ID recs     Temp (24hrs), Avg:98.6 F (37 C), Min:98.1 F (36.7 C), Max:99 F (37.2 C)  Recent Labs  Lab 05/01/21 1733  WBC 9.6  CREATININE 0.75  LATICACIDVEN 1.2    Estimated Creatinine Clearance: 78.4 mL/min (by C-G formula based on SCr of 0.75 mg/dL).    No Known Allergies  Antimicrobials this admission: vancomycin 7/27 >> (9/12)  Thank you for allowing pharmacy to be a part of this patient's care.  Cathie Hoops 05/01/2021 10:43 PM

## 2021-05-01 NOTE — ED Triage Notes (Signed)
Pt c/o infection to PICC line in L arm, placed during recent hospital stay for bacteremia. Discharged 8/4 w PICC line in place, states redness began 4 days ago, oozing began yesterday. Dressing change due Monday, was assessed today by [facility] RN, advised to come to ED.

## 2021-05-02 ENCOUNTER — Observation Stay: Payer: Self-pay

## 2021-05-02 ENCOUNTER — Other Ambulatory Visit (HOSPITAL_COMMUNITY): Payer: Self-pay

## 2021-05-02 ENCOUNTER — Other Ambulatory Visit: Payer: Self-pay | Admitting: Internal Medicine

## 2021-05-02 DIAGNOSIS — I82621 Acute embolism and thrombosis of deep veins of right upper extremity: Secondary | ICD-10-CM | POA: Diagnosis not present

## 2021-05-02 LAB — COMPREHENSIVE METABOLIC PANEL
ALT: 17 U/L (ref 0–44)
AST: 19 U/L (ref 15–41)
Albumin: 2.2 g/dL — ABNORMAL LOW (ref 3.5–5.0)
Alkaline Phosphatase: 105 U/L (ref 38–126)
Anion gap: 8 (ref 5–15)
BUN: <5 mg/dL — ABNORMAL LOW (ref 8–23)
CO2: 28 mmol/L (ref 22–32)
Calcium: 8.5 mg/dL — ABNORMAL LOW (ref 8.9–10.3)
Chloride: 102 mmol/L (ref 98–111)
Creatinine, Ser: 0.62 mg/dL (ref 0.44–1.00)
GFR, Estimated: >60 mL/min (ref >60)
Glucose, Bld: 100 mg/dL — ABNORMAL HIGH (ref 70–99)
Potassium: 2.8 mmol/L — ABNORMAL LOW (ref 3.5–5.1)
Sodium: 138 mmol/L (ref 135–145)
Total Bilirubin: 0.6 mg/dL (ref 0.3–1.2)
Total Protein: 6 g/dL — ABNORMAL LOW (ref 6.5–8.1)

## 2021-05-02 LAB — CBC WITH DIFFERENTIAL/PLATELET
Abs Immature Granulocytes: 0.04 10*3/uL (ref 0.00–0.07)
Basophils Absolute: 0.1 10*3/uL (ref 0.0–0.1)
Basophils Relative: 1 %
Eosinophils Absolute: 0.9 10*3/uL — ABNORMAL HIGH (ref 0.0–0.5)
Eosinophils Relative: 11 %
HCT: 30.7 % — ABNORMAL LOW (ref 36.0–46.0)
Hemoglobin: 9.7 g/dL — ABNORMAL LOW (ref 12.0–15.0)
Immature Granulocytes: 1 %
Lymphocytes Relative: 20 %
Lymphs Abs: 1.7 10*3/uL (ref 0.7–4.0)
MCH: 26.9 pg (ref 26.0–34.0)
MCHC: 31.6 g/dL (ref 30.0–36.0)
MCV: 85.3 fL (ref 80.0–100.0)
Monocytes Absolute: 1 10*3/uL (ref 0.1–1.0)
Monocytes Relative: 12 %
Neutro Abs: 4.7 10*3/uL (ref 1.7–7.7)
Neutrophils Relative %: 55 %
Platelets: 491 10*3/uL — ABNORMAL HIGH (ref 150–400)
RBC: 3.6 MIL/uL — ABNORMAL LOW (ref 3.87–5.11)
RDW: 14.8 % (ref 11.5–15.5)
WBC: 8.5 10*3/uL (ref 4.0–10.5)
nRBC: 0 % (ref 0.0–0.2)

## 2021-05-02 LAB — PROTIME-INR
INR: 1.1 (ref 0.8–1.2)
Prothrombin Time: 13.7 seconds (ref 11.4–15.2)

## 2021-05-02 LAB — GLUCOSE, CAPILLARY
Glucose-Capillary: 113 mg/dL — ABNORMAL HIGH (ref 70–99)
Glucose-Capillary: 125 mg/dL — ABNORMAL HIGH (ref 70–99)

## 2021-05-02 LAB — MAGNESIUM: Magnesium: 1.9 mg/dL (ref 1.7–2.4)

## 2021-05-02 LAB — APTT: aPTT: 39 seconds — ABNORMAL HIGH (ref 24–36)

## 2021-05-02 LAB — SARS CORONAVIRUS 2 (TAT 6-24 HRS): SARS Coronavirus 2: NEGATIVE

## 2021-05-02 MED ORDER — HEPARIN SOD (PORK) LOCK FLUSH 100 UNIT/ML IV SOLN
250.0000 [IU] | INTRAVENOUS | Status: AC | PRN
  Administered 2021-05-02: 250 [IU]
  Filled 2021-05-02: qty 2.5

## 2021-05-02 MED ORDER — POTASSIUM CHLORIDE CRYS ER 20 MEQ PO TBCR: 40.0000 meq | EXTENDED_RELEASE_TABLET | ORAL | Status: DC

## 2021-05-02 MED ORDER — POTASSIUM CHLORIDE CRYS ER 20 MEQ PO TBCR
40.0000 meq | EXTENDED_RELEASE_TABLET | Freq: Three times a day (TID) | ORAL | Status: DC
  Administered 2021-05-02 (×2): 40 meq via ORAL
  Filled 2021-05-02 (×2): qty 2

## 2021-05-02 MED ORDER — CHLORHEXIDINE GLUCONATE CLOTH 2 % EX PADS: 6.0000 | MEDICATED_PAD | Freq: Every day | CUTANEOUS | Status: DC

## 2021-05-02 MED ORDER — APIXABAN (ELIQUIS) VTE STARTER PACK (10MG AND 5MG): ORAL_TABLET | ORAL | 0 refills | Status: DC

## 2021-05-02 MED ORDER — POTASSIUM CHLORIDE CRYS ER 20 MEQ PO TBCR: 40.0000 meq | EXTENDED_RELEASE_TABLET | Freq: Two times a day (BID) | ORAL | 0 refills | Status: DC

## 2021-05-02 MED ORDER — SODIUM CHLORIDE 0.9% FLUSH
10.0000 mL | INTRAVENOUS | Status: DC | PRN
  Administered 2021-05-02: 10 mL

## 2021-05-02 NOTE — Discharge Summary (Signed)
Physician Discharge Summary  Katie Serrano:[id number] DOB: Jan 24, 1956 DOA: 05/01/2021  PCP: Raina Mina., MD  Admit date: 05/01/2021 Discharge date: 05/02/2021  Admitted From: Home Disposition: Home with home health  Recommendations for Outpatient Follow-up:  Outpatient antibiotic instructions and infectious disease clinic follow-up to resume as before.  Home Health: Home infusion therapy Equipment/Devices: Already present  Discharge Condition: Stable CODE STATUS: Full code Diet recommendation: Low-salt, low-carb diet  Discharge summary: Patient with extensive medical issues, recently diagnosed MRSA bacteremia secondary to UTI, right knee septic arthritis currently on vancomycin through right PICC line sent to ER when the PICC line was swollen and noted not functioning well with arm pain.  Ultrasound of the right upper extremity revealed is indeterminate DVT involving right internal jugular vein, right subclavian vein and right axillary vein.  Started on Lovenox.  Otherwise hemodynamically stable.  Admitted to hospital to avoid disruption of IV antibiotics for MRSA bacteremia.  Right sided PICC line taken out, new PICC line given on the left arm.  Started on Lovenox, will change to Eliquis on discharge.  Antibiotics plan remains same to use vancomycin until 9/12 followed by doxycycline.  Patient is already on aspirin, she has extensive coronary artery disease, cardiac cath on 03/12/21 with multiple vessel disease managed medically.  Addition of aspirin and Eliquis increases risk of bleeding, however patient has extensive atherosclerotic disease and will need aspirin.  Medically stable to go home today after PICC line placement, patient and her husband agreed on apixaban for 3 months to treat upper extremity DVT associated with PICC line.  Resuming IV vancomycin, home infusion therapies and plan as before.   Discharge Diagnoses:  Principal Problem:   Acute deep vein thrombosis (DVT)  of right upper extremity (HCC) Active Problems:   Nicotine dependence, cigarettes, uncomplicated   Essential hypertension   GERD without esophagitis   Mixed hyperlipidemia   Coronary artery disease of native artery of native heart with stable angina pectoris (HCC)   MRSA bacteremia   Type 2 diabetes mellitus with hyperglycemia, with long-term current use of insulin (HCC)   COPD (chronic obstructive pulmonary disease) (HCC)   Mixed diabetic hyperlipidemia associated with type 2 diabetes mellitus [facility])    Discharge Instructions  Discharge Instructions     Call MD for:  difficulty breathing, headache or visual disturbances   Complete by: As directed    Call MD for:  redness, tenderness, or signs of infection (pain, swelling, redness, odor or green/yellow discharge around incision site)   Complete by: As directed    Call MD for:  severe uncontrolled pain   Complete by: As directed    Call MD for:  temperature >100.4   Complete by: As directed    Diet - low sodium heart healthy   Complete by: As directed    Diet Carb Modified   Complete by: As directed    Discharge instructions   Complete by: As directed    Resume all antibiotics instructions through the new PICC line as you are doing.   Increase activity slowly   Complete by: As directed    No wound care   Complete by: As directed       Allergies as of 05/02/2021   No Known Allergies      Medication List     STOP taking these medications    amLODipine 10 MG tablet Commonly known as: NORVASC   Basaglar KwikPen 100 UNIT/ML   docusate sodium 100 MG capsule Commonly known as:  COLACE   insulin lispro 100 UNIT/ML KwikPen Commonly known as: HUMALOG   melatonin 5 MG Tabs   menthol-cetylpyridinium 3 MG lozenge Commonly known as: CEPACOL   polyethylene glycol powder 17 GM/SCOOP powder Commonly known as: GLYCOLAX/MIRALAX   senna 8.6 MG Tabs tablet Commonly known as: SENOKOT       TAKE these medications     acetaminophen 325 MG tablet Commonly known as: TYLENOL Take 1-2 tablets (325-650 mg total) by mouth every 4 (four) hours as needed for mild pain.   albuterol 108 (90 Base) MCG/ACT inhaler Commonly known as: VENTOLIN HFA Inhale 2 puffs into the lungs every 6 (six) hours as needed for wheezing or shortness of breath.   Apixaban Starter Pack (29m and 553m Commonly known as: ELIQUIS STARTER PACK Take as directed on package: start with two-58m48mablets twice daily for 7 days. On day 8, switch to one-58mg49mblet twice daily.   aspirin 81 MG chewable tablet Chew 1 tablet (81 mg total) by mouth 2 (two) times daily.   Cinnamon 500 MG capsule Take 500 mg by mouth 2 (two) times a week.   doxycycline 100 MG tablet Commonly known as: VIBRA-TABS Take 1 tablet (100 mg total) by mouth 2 (two) times daily.   fluticasone 50 MCG/ACT nasal spray Commonly known as: FLONASE Place 2 sprays into both nostrils daily. What changed:  when to take this reasons to take this   Insulin Pen Needle 32G X 4 MM Misc Use as directed 4 (four) times daily.   losartan 50 MG tablet Commonly known as: COZAAR Take 1 tablet (50 mg total) by mouth at bedtime. What changed: when to take this   metFORMIN 500 MG 24 hr tablet Commonly known as: GLUCOPHAGE-XR Take 500 mg by mouth in the morning and at bedtime.   methocarbamol 500 MG tablet Commonly known as: ROBAXIN Take 1 tablet (500 mg total) by mouth 4 (four) times daily.   metoprolol tartrate 50 MG tablet Commonly known as: LOPRESSOR Take 1 tablet (50 mg total) by mouth 2 (two) times daily.   morphine 30 MG 12 hr tablet Commonly known as: [name] Take 1 tablet (30 mg total) by mouth every 12 (twelve) hours.   multivitamin capsule Take 1 capsule by mouth daily as needed (nutrition).   nitroGLYCERIN 0.4 MG SL tablet Commonly known as: NITROSTAT Place 1 tablet (0.4 mg total) under the tongue every 5 (five) minutes as needed.   Omega-3 1000 MG  Caps Take 1,000 mg by mouth daily.   oxyCODONE 5 MG immediate release tablet Commonly known as: Oxy IR/ROXICODONE Take 1 tablet (5 mg total) by mouth 2 (two) times daily as needed for moderate pain or severe pain.   phenol 1.4 % Liqd Commonly known as: CHLORASEPTIC Use as directed 1 spray in the mouth or throat as needed for throat irritation / pain.   potassium chloride SA 20 MEQ tablet Commonly known as: KLOR-CON Take 2 tablets (40 mEq total) by mouth 2 (two) times daily for 3 days.   pravastatin 20 MG tablet Commonly known as: PRAVACHOL Take 20 mg by mouth every evening.   Semglee (yfgn) 100 UNIT/ML Pen Generic drug: insulin glargine-yfgn Inject 14 Units into the skin in the morning.   vancomycin  IVPB Inject 1,000 mg into the vein every 12 (twelve) hours. Indication:  MRSA bacteremia and suspected endocarditis First Dose: Yes Last Day of Therapy:  05/19/21 Labs - Sunday/Monday:  CBC/D, BMP, and vancomycin trough. Labs - Thursday:  BMP  and vancomycin trough Labs - Every other week:  ESR and CRP Method of administration:Elastomeric Method of administration may be changed at the discretion of the patient and/or caregiver's ability to self-administer the medication ordered.        No Known Allergies  Consultations: None   Procedures/Studies: DG Chest 1 View  Result Date: 05/01/2021 CLINICAL DATA:  Infected left-sided PICC line. EXAM: CHEST  1 VIEW COMPARISON:  03/30/2021 FINDINGS: Right PICC line tip at low SVC. Cervical spine fixation. Midline trachea. Borderline cardiomegaly. Tortuous thoracic aorta. No pleural effusion or pneumothorax. Clear lungs. IMPRESSION: No acute cardiopulmonary disease. Electronically Signed   By: Abigail Miyamoto M.D.   On: 05/01/2021 18:19   MR BRAIN W WO CONTRAST  Result Date: 04/03/2021 CLINICAL DATA:  Initial evaluation for acute headache, papilledema, history of MRSA bacteremia. EXAM: MRI HEAD AND ORBITS WITHOUT AND WITH CONTRAST  TECHNIQUE: Multiplanar, multiecho pulse sequences of the brain and surrounding structures were obtained without and with intravenous contrast. Multiplanar, multiecho pulse sequences of the orbits and surrounding structures were obtained including fat saturation techniques, before and after intravenous contrast administration. CONTRAST:  8.46m GADAVIST GADOBUTROL 1 MMOL/ML IV SOLN COMPARISON:  Prior CT from 05/02/2011. FINDINGS: MRI HEAD FINDINGS Brain: Cerebral volume within normal limits for age. Scattered patchy T2/FLAIR hyperintensity seen involving the periventricular, deep, and subcortical white matter both cerebral hemispheres as well as the pons, most likely related chronic microvascular ischemic disease, moderate in nature. 7 mm acute ischemic infarcts seen involving the left caudate head (series 2, image 30). Few additional scattered subcentimeter ischemic infarcts noted involving the cortical and subcortical left parietal lobe (series 2, images 34, 33) as well as the left splenium (series 2, image 27). Suspected additional subtle acute to subacute ischemic infarct noted involving the right temporal lobe (series 2, image 19). Suspected subtle associated enhancement about the right temporal infarct (series 17, image 11). No other associated enhancement. No mass effect or associated hemorrhage. Otherwise, gray-white matter differentiation maintained. No encephalomalacia to suggest chronic cortical infarction elsewhere within the brain. No other foci of susceptibility artifact to suggest acute or chronic intracranial hemorrhage. No mass lesion, midline shift or mass effect. No hydrocephalus or extra-axial fluid collection. No intraventricular effusion or debris. Pituitary gland suprasellar region within normal limits. Midline structures intact and normal. On corresponding orbit portion of this exam, there is question of diffuse left a meningeal enhancement involving both cerebral hemispheres (series 14, image  1). Finding not entirely certain, and could be technical in nature. No other abnormal enhancement. Vascular: Major intracranial vascular flow voids are well maintained. Skull and upper cervical spine: Craniocervical junction within normal limits. Bone marrow signal intensity normal. No scalp soft tissue abnormality. Other: Trace left mastoid effusion, of doubtful significance. Inner ear structures grossly normal. MRI ORBITS FINDINGS Orbits: Globes are symmetric in size with normal appearance and morphology bilaterally. Apparent asymmetric FLAIR signal abnormality involving the right globe felt to be artifactual on this exam, with no signal abnormality seen on corresponding sequences. Optic nerves within normal limits without intrinsic edema or abnormal enhancement. No abnormality about the optic nerve sheaths. Intraconal and extraconal fat well-maintained. Extra-ocular muscles symmetric and within normal limits. Lacrimal glands normal. No abnormality about the orbital apices. Cavernous sinus within normal limits. Superior orbital veins symmetric and unremarkable. Optic chiasm normally situated within the suprasellar cistern without abnormality. No evidence for acute infection about either orbit. Question diffuse leptomeningeal enhancement involving the visualized cerebral hemispheres as above. Visualized sinuses: Scattered mucosal thickening  noted about the ethmoidal air cells. Visualized paranasal sinuses are otherwise clear. Soft tissues: Unremarkable. IMPRESSION: 1. 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 No associated hemorrhage or mass effect. 2. Question diffuse leptomeningeal enhancement involving both cerebral hemispheres as above. Finding not entirely certain, and could be technical in nature on this exam. However, possible meningitis should be considered in the setting of MRSA  bacteremia and scattered ischemic infarcts. Correlation with LP and CSF studies recommended for further evaluation. 3. No other acute intracranial abnormality. No other evidence for acute CNS or intraorbital infection. 4. Moderate chronic microvascular ischemic disease. Electronically Signed   By: Jeannine Boga M.D.   On: 04/03/2021 22:10   MR Lumbar Spine W Wo Contrast  Result Date: 04/03/2021 CLINICAL DATA:  Initial evaluation for intermittent left flank pain with radiation into the left lower back. EXAM: MRI LUMBAR SPINE WITHOUT AND WITH CONTRAST TECHNIQUE: Multiplanar and multiecho pulse sequences of the lumbar spine were obtained without and with intravenous contrast. CONTRAST:  8.41m GADAVIST GADOBUTROL 1 MMOL/ML IV SOLN COMPARISON:  Prior CT from 03/31/2024. FINDINGS: Segmentation: Standard. Lowest well-formed disc space labeled the L5-S1 level. Alignment: Physiologic with preservation of the normal lumbar lordosis. No listhesis. Vertebrae: Vertebral body height maintained without acute or chronic fracture. Bone marrow signal intensity within normal limits. No worrisome osseous lesions. Joint effusions noted about the left greater than right L4-5 facets without significant marrow edema. No other abnormal marrow edema or enhancement to suggest osteomyelitis discitis. Conus medullaris and cauda equina: Conus extends to the L2 level. Conus and cauda equina appear normal. Paraspinal and other soft tissues: Diffuse edema and enhancement seen about the left greater than right lower posterior paraspinous musculature, most pronounced about the left L4-5 facet (series 6, image 12) small collection emanating from the left L4-5 facet as below. No other soft tissue collections. Mild scattered perinephric and retroperitoneal stranding noted, of uncertain significance, also seen on prior CT. Left periaortic lymph node measures 1 cm in short access, upper limits of normal. Visualized visceral structures otherwise  unremarkable. Disc levels: L1-2: Negative interspace. Minimal facet spurring. No canal or foraminal stenosis. L2-3: Mild disc bulge with disc desiccation. Small biforaminal annular fissures noted. Mild facet hypertrophy with associated trace joint effusions. No significant spinal stenosis. Foramina remain patent. L3-4: Disc desiccation with mild annular disc bulge. Superimposed small left foraminal to extraforaminal disc protrusion closely approximates the exiting left L3 nerve root (series 8, image 23). Mild facet spurring with associated trace joint effusions. Tiny T2 hyperintense lesions measuring 4-5 mm situated posterior to the L3-4 facets noted, possibly reflecting small synovial cysts (series 8, image 27). These are not in a position to cause neural compromise. No spinal stenosis. Foramina remain patent. L4-5: Mild disc bulge with disc desiccation. Superimposed small left foraminal disc protrusion closely approximates and/or contacts the exiting left L4 nerve root (series 7, image 13). Moderate left worse than right facet hypertrophy with associated small joint effusions. There is a superimposed cystic collection/lesion along the anteromedial aspect of the left L4-5 facet, measuring 1.2 x 0.5 x 1.3 cm (series 6, image 11). Lesion contacts and mildly displaces the descending left L5 nerve root in the left lateral recess with resultant mild left lateral recess stenosis. There is an additional T2 hyperintense lesion situated posterior to the L4-5 facet measuring 1.6 x 0.9 x 1.2 cm (series 8, image 33). These lesions demonstrate peripheral enhancement.  Overall, findings are favored to reflect acute facet arthritis with associated synovial cysts. Possible septic arthritis with associated small collections difficult to exclude, and could be considered in the correct clinical setting. No other significant spinal stenosis. Mild left foraminal narrowing. Right neural foramina remains patent. L5-S1: Minimal disc bulge  with disc desiccation. Small central annular fissure. Mild bilateral facet hypertrophy. No canal or lateral recess stenosis. Foramina remain patent. IMPRESSION: 1. Joint effusion with surrounding enhancement about the left L4-5 facet, with associated two cystic collections/lesions measuring up to 1.6 cm as above. 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 Possible septic arthritis with associated small collections difficult to exclude, and could be considered in the correct clinical setting. Secondary mass effect on the descending left L5 nerve root by one of these collections with resultant mild left lateral recess stenosis, which could contribute to left lower extremity symptoms. 2. Abnormal edema and enhancement within the left greater than right lower posterior paraspinous musculature, greatest about the left L4-5 facet. Findings suggestive of acute myositis, which could be either infectious or inflammatory in nature. Possible muscular injury/strain could also be considered in the correct clinical setting. 3. Additional small left foraminal to extraforaminal disc protrusions at L3-4 and L4-5, potentially affecting the exiting left L3 and L4 nerve roots respectively. 4. No other evidence for acute infection within the lumbar spine. Electronically Signed   By: Jeannine Boga M.D.   On: 04/03/2021 02:25   ECHO TEE  Result Date: 04/04/2021    TRANSESOPHOGEAL ECHO REPORT   Patient Name:   Katie Serrano Date of Exam: 04/04/2021 Medical Rec #:  [id number]       Height:       66.0 in Accession #:    [id number]      Weight:       185.0 lb Date of Birth:  [birth year]       BSA:          1.934 m Patient Age:    29 years        BP:           154/87 mmHg Patient Gender: F               HR:           71 bpm. Exam Location:  Inpatient Procedure: 3D Echo, Cardiac Doppler, Color Doppler, Saline Contrast Bubble Study            and Transesophageal Echo  Indications:     Bacteremia R78.81  History:         Patient has prior history of Echocardiogram examinations, most                  recent 04/02/2021. CAD, COPD, Signs/Symptoms:Fever and Fatigue;                  Risk Factors:Hypertension, Diabetes, Dyslipidemia and Current                  Smoker.  Sonographer:     Jonelle Sidle Dance Referring Phys:  [id number] Jeanella Flattery AMIN Diagnosing Phys: Eleonore Chiquito MD PROCEDURE: After discussion of the risks and benefits of a TEE, an informed consent was obtained from the patient. TEE procedure time was 10 minutes. The transesophogeal probe was passed without difficulty through the esophogus of the patient. Imaged were obtained with the patient in a left lateral decubitus position. Local oropharyngeal anesthetic was provided with Cetacaine. Sedation  performed by different physician. The patient was monitored while under deep sedation. Anesthestetic sedation was provided intravenously by Anesthesiology: 98m of Propofol, 1034mof Lidocaine. Image quality was excellent. The patient's vital signs; including heart rate, blood pressure, and oxygen saturation; remained stable throughout the procedure. The patient developed no complications during the procedure. IMPRESSIONS  1. Left ventricular ejection fraction, by estimation, is 55 to 60%. The left ventricle has normal function.  2. Right ventricular systolic function is normal. The right ventricular size is normal.  3. No left atrial/left atrial appendage thrombus was detected.  4. The mitral valve is grossly normal. Mild mitral valve regurgitation. No evidence of mitral stenosis.  5. The aortic valve is tricuspid. Aortic valve regurgitation is not visualized. No aortic stenosis is present.  6. There is mild (Grade II) layered plaque involving the descending aorta.  7. Agitated saline contrast bubble study was negative, with no evidence of any interatrial shunt. Conclusion(s)/Recommendation(s): No evidence of vegetation/infective  endocarditis on this transesophageal echocardiogram. FINDINGS  Left Ventricle: Left ventricular ejection fraction, by estimation, is 55 to 60%. The left ventricle has normal function. The left ventricular internal cavity size was normal in size. Right Ventricle: The right ventricular size is normal. No increase in right ventricular wall thickness. Right ventricular systolic function is normal. Left Atrium: Left atrial size was normal in size. No left atrial/left atrial appendage thrombus was detected. Right Atrium: Right atrial size was normal in size. Pericardium: Trivial pericardial effusion is present. Mitral Valve: The mitral valve is grossly normal. Mild mitral valve regurgitation. No evidence of mitral valve stenosis. There is no evidence of mitral valve vegetation. Tricuspid Valve: The tricuspid valve is normal in structure. Tricuspid valve regurgitation is trivial. No evidence of tricuspid stenosis. There is no evidence of tricuspid valve vegetation. Aortic Valve: The aortic valve is tricuspid. Aortic valve regurgitation is not visualized. No aortic stenosis is present. There is no evidence of aortic valve vegetation. Pulmonic Valve: The pulmonic valve was normal in structure. Pulmonic valve regurgitation is trivial. No evidence of pulmonic stenosis. There is no evidence of pulmonic valve vegetation. Aorta: The aortic root and ascending aorta are structurally normal, with no evidence of dilitation. There is mild (Grade II) layered plaque involving the descending aorta. Venous: The left upper pulmonary vein, left lower pulmonary vein, right upper pulmonary vein and right lower pulmonary vein are normal. IAS/Shunts: No atrial level shunt detected by color flow Doppler. Agitated saline contrast was given intravenously to evaluate for intracardiac shunting. Agitated saline contrast bubble study was negative, with no evidence of any interatrial shunt.   AORTA Ao Root diam: 2.86 cm Ao Asc diam:  3.27 cm TRICUSPID  VALVE TR Peak grad:   16.5 mmHg TR Vmax:        203.00 cm/s WeEleonore ChiquitoD Electronically signed by WeEleonore ChiquitoD Signature Date/Time: 04/04/2021/1:31:34 PM    Final    UE Venous Duplex ([facility] and [facility] ONLY)  Result Date: 05/01/2021 UPPER VENOUS STUDY  Patient Name:  Katie LEAPDate of Exam:   05/01/2021 Medical Rec #: [id number]      Accession #:    [id number] of Birth: [birth year]      Patient Gender: F Patient Age:   [age]ears [facility]      VAS [place]PPER EXTREMITY VENOUS DUPLEX Referring Phys: ANVicente MalesRIGHT --------------------------------------------------------------------------------  Indications: Pain, swelling, discoloration at PICC site. Limitations: Bandages and poor ultrasound/tissue interface.  Comparison Study: No prior study Performing Technologist: Maudry Mayhew MHA, RDMS, RVT, RDCS  Examination Guidelines: A complete evaluation includes B-mode imaging, spectral Doppler, color Doppler, and power Doppler as needed of all accessible portions of each vessel. Bilateral testing is considered an integral part of a complete examination. Limited examinations for reoccurring indications may be performed as noted.  Right Findings: +----------+------------+---------+-----------+----------+---------------------+ RIGHT     CompressiblePhasicitySpontaneousProperties       Summary        +----------+------------+---------+-----------+----------+---------------------+ IJV         Partial      Yes       Yes                Age Indeterminate   +----------+------------+---------+-----------+----------+---------------------+ Subclavian    None       Yes       Yes                Age indeterminate                                                         surrounding PICC    +----------+------------+---------+-----------+----------+---------------------+ Axillary      None       Yes       Yes                Age indeterminate                                                          surrounding PICC    +----------+------------+---------+-----------+----------+---------------------+ Radial        Full                                                        +----------+------------+---------+-----------+----------+---------------------+ Ulnar         Full                                                        +----------+------------+---------+-----------+----------+---------------------+ Cephalic      None                                    Age indeterminate                                                         surrounding PICC    +----------+------------+---------+-----------+----------+---------------------+ Basilic  Not visualized     +----------+------------+---------+-----------+----------+---------------------+  Left Findings: +----------+------------+---------+-----------+----------+-------+ LEFT      CompressiblePhasicitySpontaneousPropertiesSummary +----------+------------+---------+-----------+----------+-------+ Subclavian               Yes       Yes                      +----------+------------+---------+-----------+----------+-------+  Summary:  Right: Findings consistent with age indeterminate deep vein thrombosis involving the right internal jugular vein, right subclavian vein and right axillary vein. Thrombus is surrounding the PICC line in the subclavian and axillary veins.  *See table(s) above for measurements and observations.     Preliminary    [place] EKG SITE RITE  Result Date: 05/02/2021 If Site Rite image not attached, placement could not be confirmed due to current cardiac rhythm.  [place] EKG SITE RITE  Result Date: 04/08/2021 If Site Rite image not attached, placement could not be confirmed due to current cardiac rhythm.  CT ANGIO HEAD CODE STROKE  Result Date: 04/05/2021 CLINICAL DATA:  Follow-up examination for acute stroke.  EXAM: CT ANGIOGRAPHY HEAD AND NECK TECHNIQUE: Multidetector CT imaging of the head and neck was performed using the standard protocol during bolus administration of intravenous contrast. Multiplanar CT image reconstructions and MIPs were obtained to evaluate the vascular anatomy. Carotid stenosis measurements (when applicable) are obtained utilizing NASCET criteria, using the distal internal carotid diameter as the denominator. CONTRAST:  95m OMNIPAQUE IOHEXOL 350 MG/ML SOLN COMPARISON:  MRI from 04/03/2021. FINDINGS: CT HEAD FINDINGS Brain: Previously identified scattered subcentimeter acute ischemic infarcts not well visualized by CT. No other acute large vessel territory infarct. No intracranial hemorrhage. No mass lesion, mass effect, or midline shift. No hydrocephalus or extra-axial fluid collection. Vascular: No hyperdense vessel. Skull: Scalp soft tissues and calvarium within normal limits. Sinuses: Paranasal sinuses and mastoid air cells are clear. Orbits: Globes and orbital soft tissues demonstrate no acute finding. Review of the MIP images confirms the above findings CTA NECK FINDINGS Aortic arch: Visualized aortic arch normal caliber with normal branch pattern. Mild atheromatous change about the visualized arch. No hemodynamically significant stenosis seen about the origin the great vessels. Right carotid system: Right common and internal carotid arteries widely patent without stenosis, dissection or occlusion. Left carotid system: Left common and internal carotid arteries patent without stenosis, dissection or occlusion. Vertebral arteries: Left vertebral artery arises directly from the aortic arch. Vertebral arteries widely patent within the neck without stenosis, dissection or occlusion. Skeleton: Prior ACDF at C5-C7. No discrete or worrisome osseous lesions. Patient is edentulous. Other neck: No other acute soft tissue abnormality within the neck. No mass or adenopathy. Upper chest: Visualized upper  chest demonstrates no acute finding. Review of the MIP images confirms the above findings CTA HEAD FINDINGS Anterior circulation: Petrous segments patent bilaterally. Mild atheromatous change within the carotid siphons without significant stenosis. A1 segments widely patent. Normal anterior communicating artery complex. Anterior cerebral arteries patent to their distal aspects without stenosis. No M1 stenosis or occlusion. Normal MCA bifurcations. Distal MCA branches well perfused and symmetric. Posterior circulation: Both V4 segments patent to the vertebrobasilar junction without stenosis. Both PICA origins patent and normal. Basilar widely patent to its distal aspect without stenosis. Superior cerebellar arteries patent bilaterally. Both PCAs primarily supplied via the basilar and are well perfused to there distal aspects. Venous sinuses: Grossly patent allowing for timing the contrast bolus. Anatomic variants: None significant.  No aneurysm. Review of the MIP images confirms the above findings IMPRESSION:  CT HEAD IMPRESSION: 1. Previously identified scattered subcentimeter ischemic infarcts not well visualized by CT. 2. No other new acute intracranial abnormality. CTA HEAD AND NECK IMPRESSION: Negative CTA of the head and neck. No large vessel occlusion. No hemodynamically significant or correctable stenosis. Electronically Signed   By: Jeannine Boga M.D.   On: 04/05/2021 00:20   CT ANGIO NECK CODE STROKE  Result Date: 04/05/2021 CLINICAL DATA:  Follow-up examination for acute stroke. EXAM: CT ANGIOGRAPHY HEAD AND NECK TECHNIQUE: Multidetector CT imaging of the head and neck was performed using the standard protocol during bolus administration of intravenous contrast. Multiplanar CT image reconstructions and MIPs were obtained to evaluate the vascular anatomy. Carotid stenosis measurements (when applicable) are obtained utilizing NASCET criteria, using the distal internal carotid diameter as the  denominator. CONTRAST:  61m OMNIPAQUE IOHEXOL 350 MG/ML SOLN COMPARISON:  MRI from 04/03/2021. FINDINGS: CT HEAD FINDINGS Brain: Previously identified scattered subcentimeter acute ischemic infarcts not well visualized by CT. No other acute large vessel territory infarct. No intracranial hemorrhage. No mass lesion, mass effect, or midline shift. No hydrocephalus or extra-axial fluid collection. Vascular: No hyperdense vessel. Skull: Scalp soft tissues and calvarium within normal limits. Sinuses: Paranasal sinuses and mastoid air cells are clear. Orbits: Globes and orbital soft tissues demonstrate no acute finding. Review of the MIP images confirms the above findings CTA NECK FINDINGS Aortic arch: Visualized aortic arch normal caliber with normal branch pattern. Mild atheromatous change about the visualized arch. No hemodynamically significant stenosis seen about the origin the great vessels. Right carotid system: Right common and internal carotid arteries widely patent without stenosis, dissection or occlusion. Left carotid system: Left common and internal carotid arteries patent without stenosis, dissection or occlusion. Vertebral arteries: Left vertebral artery arises directly from the aortic arch. Vertebral arteries widely patent within the neck without stenosis, dissection or occlusion. Skeleton: Prior ACDF at C5-C7. No discrete or worrisome osseous lesions. Patient is edentulous. Other neck: No other acute soft tissue abnormality within the neck. No mass or adenopathy. Upper chest: Visualized upper chest demonstrates no acute finding. Review of the MIP images confirms the above findings CTA HEAD FINDINGS Anterior circulation: Petrous segments patent bilaterally. Mild atheromatous change within the carotid siphons without significant stenosis. A1 segments widely patent. Normal anterior communicating artery complex. Anterior cerebral arteries patent to their distal aspects without stenosis. No M1 stenosis or  occlusion. Normal MCA bifurcations. Distal MCA branches well perfused and symmetric. Posterior circulation: Both V4 segments patent to the vertebrobasilar junction without stenosis. Both PICA origins patent and normal. Basilar widely patent to its distal aspect without stenosis. Superior cerebellar arteries patent bilaterally. Both PCAs primarily supplied via the basilar and are well perfused to there distal aspects. Venous sinuses: Grossly patent allowing for timing the contrast bolus. Anatomic variants: None significant.  No aneurysm. Review of the MIP images confirms the above findings IMPRESSION: CT HEAD IMPRESSION: 1. Previously identified scattered subcentimeter ischemic infarcts not well visualized by CT. 2. No other new acute intracranial abnormality. CTA HEAD AND NECK IMPRESSION: Negative CTA of the head and neck. No large vessel occlusion. No hemodynamically significant or correctable stenosis. Electronically Signed   By: BJeannine BogaM.D.   On: 04/05/2021 00:20   MR ORBITS W WO CONTRAST  Result Date: 04/03/2021 CLINICAL DATA:  Initial evaluation for acute headache, papilledema, history of MRSA bacteremia. EXAM: MRI HEAD AND ORBITS WITHOUT AND WITH CONTRAST TECHNIQUE: Multiplanar, multiecho pulse sequences of the brain and surrounding structures were obtained without and with  intravenous contrast. Multiplanar, multiecho pulse sequences of the orbits and surrounding structures were obtained including fat saturation techniques, before and after intravenous contrast administration. CONTRAST:  8.77m GADAVIST GADOBUTROL 1 MMOL/ML IV SOLN COMPARISON:  Prior CT from 05/02/2011. FINDINGS: MRI HEAD FINDINGS Brain: Cerebral volume within normal limits for age. Scattered patchy T2/FLAIR hyperintensity seen involving the periventricular, deep, and subcortical white matter both cerebral hemispheres as well as the pons, most likely related chronic microvascular ischemic disease, moderate in nature. 7 mm  acute ischemic infarcts seen involving the left caudate head (series 2, image 30). Few additional scattered subcentimeter ischemic infarcts noted involving the cortical and subcortical left parietal lobe (series 2, images 34, 33) as well as the left splenium (series 2, image 27). Suspected additional subtle acute to subacute ischemic infarct noted involving the right temporal lobe (series 2, image 19). Suspected subtle associated enhancement about the right temporal infarct (series 17, image 11). No other associated enhancement. No mass effect or associated hemorrhage. Otherwise, gray-white matter differentiation maintained. No encephalomalacia to suggest chronic cortical infarction elsewhere within the brain. No other foci of susceptibility artifact to suggest acute or chronic intracranial hemorrhage. No mass lesion, midline shift or mass effect. No hydrocephalus or extra-axial fluid collection. No intraventricular effusion or debris. Pituitary gland suprasellar region within normal limits. Midline structures intact and normal. On corresponding orbit portion of this exam, there is question of diffuse left a meningeal enhancement involving both cerebral hemispheres (series 14, image 1). Finding not entirely certain, and could be technical in nature. No other abnormal enhancement. Vascular: Major intracranial vascular flow voids are well maintained. Skull and upper cervical spine: Craniocervical junction within normal limits. Bone marrow signal intensity normal. No scalp soft tissue abnormality. Other: Trace left mastoid effusion, of doubtful significance. Inner ear structures grossly normal. MRI ORBITS FINDINGS Orbits: Globes are symmetric in size with normal appearance and morphology bilaterally. Apparent asymmetric FLAIR signal abnormality involving the right globe felt to be artifactual on this exam, with no signal abnormality seen on corresponding sequences. Optic nerves within normal limits without intrinsic  edema or abnormal enhancement. No abnormality about the optic nerve sheaths. Intraconal and extraconal fat well-maintained. Extra-ocular muscles symmetric and within normal limits. Lacrimal glands normal. No abnormality about the orbital apices. Cavernous sinus within normal limits. Superior orbital veins symmetric and unremarkable. Optic chiasm normally situated within the suprasellar cistern without abnormality. No evidence for acute infection about either orbit. Question diffuse leptomeningeal enhancement involving the visualized cerebral hemispheres as above. Visualized sinuses: Scattered mucosal thickening noted about the ethmoidal air cells. Visualized paranasal sinuses are otherwise clear. Soft tissues: Unremarkable. IMPRESSION: 1. 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 No associated hemorrhage or mass effect. 2. Question diffuse leptomeningeal enhancement involving both cerebral hemispheres as above. Finding not entirely certain, and could be technical in nature on this exam. However, possible meningitis should be considered in the setting of MRSA bacteremia and scattered ischemic infarcts. Correlation with LP and CSF studies recommended for further evaluation. 3. No other acute intracranial abnormality. No other evidence for acute CNS or intraorbital infection. 4. Moderate chronic microvascular ischemic disease. Electronically Signed   By: BJeannine BogaM.D.   On: 04/03/2021 22:10   (Echo, Carotid, EGD, Colonoscopy, ERCP)    Subjective: Patient seen and examined.  Husband at the bedside.  Denies any complaints.  Her main problem is that PICC line was not flushing well  for last 1 week and noted to have more itching and swelling around.  Denies any fever.  She has some right knee pain on the posterior aspect and some swelling but chronic.  Afebrile.   Discharge  Exam: Vitals:   05/01/21 2200 05/02/21 0857  BP: (!) 166/74 (!) 183/77  Pulse: 66 75  Resp: 16 18  Temp: 98.1 F (36.7 C) 98.4 F (36.9 C)  SpO2: 98% 98%   Vitals:   05/01/21 1716 05/01/21 1903 05/01/21 2200 05/02/21 0857  BP: (!) 183/95 (!) 165/75 (!) 166/74 (!) 183/77  Pulse: 89 82 66 75  Resp: 16 16 16 18   Temp: 99 F (37.2 C)  98.1 F (36.7 C) 98.4 F (36.9 C)  TempSrc: Oral  Oral Oral  SpO2: 99% 99% 98% 98%    General: Pt is alert, awake, not in acute distress Sitting at the edge of the bed.  Not in any distress. Cardiovascular: RRR, S1/S2 +, no rubs, no gallops Respiratory: CTA bilaterally, no wheezing, no rhonchi Abdominal: Soft, NT, ND, bowel sounds + Extremities: Right upper extremity PICC line with some surrounding swelling and edema, nontender.  Distal neurovascular status intact. Both legs with 1+ pedal edema, right knee with postoperative incision that is clean and dry.  No fluctuation, effusion or erythema.    The results of significant diagnostics from this hospitalization (including imaging, microbiology, ancillary and laboratory) are listed below for reference.     Microbiology: Recent Results (from the past 240 hour(s))  Blood culture (routine single)     Status: None (Preliminary result)   Collection Time: 05/01/21  5:34 PM   Specimen: BLOOD  Result Value Ref Range Status   Specimen Description BLOOD SITE NOT SPECIFIED  Final   Special Requests   Final    BOTTLES DRAWN AEROBIC AND ANAEROBIC Blood Culture adequate volume   Culture   Final    NO GROWTH < 12 HOURS Performed at [facility], [address] [place], [place] [address]    Report Status PENDING  Incomplete  SARS CORONAVIRUS 2 (TAT 6-24 HRS) Nasopharyngeal Nasopharyngeal Swab     Status: None   Collection Time: 05/01/21  9:30 PM   Specimen: Nasopharyngeal Swab  Result Value Ref Range Status   SARS Coronavirus 2 NEGATIVE NEGATIVE Final    Comment: (NOTE) SARS-CoV-2 target  nucleic acids are NOT DETECTED.  The SARS-CoV-2 RNA is generally detectable in upper and lower respiratory specimens during the acute phase of infection. Negative results do not preclude SARS-CoV-2 infection, do not rule out co-infections with other pathogens, and should not be used as the sole basis for treatment or other patient management decisions. Negative results must be combined with clinical observations, patient history, and epidemiological information. The expected result is Negative.  Fact Sheet for Patients: SugarRoll.be  Fact Sheet for Healthcare Providers: https://www.woods-mathews.com/  This test is not yet approved or cleared by the [place] FDA and  has been authorized for detection and/or diagnosis of SARS-CoV-2 by FDA under an Emergency Use Authorization (EUA). This EUA will remain  in effect (meaning this test can be used) for the duration of the COVID-19 declaration under Se ction 564(b)(1) of the Act, 21 U.S.C. section 360bbb-3(b)(1), unless the authorization is terminated or revoked sooner.  Performed at [facility], [place] [address], [place], [place] [address]      Labs: BNP (last 3 results) No results for input(s): BNP in the last 8760 hours. Basic Metabolic Panel: Recent Labs  Lab 05/01/21 1733 05/02/21 0319  NA 139 138  K 2.8* 2.8*  CL 102 102  CO2 30 28  GLUCOSE 170* 100*  BUN <5* <5*  CREATININE 0.75 0.62  CALCIUM 8.5* 8.5*  MG  --  1.9   Liver Function Tests: Recent Labs  Lab 05/01/21 1733 05/02/21 0319  AST 21 19  ALT 19 17  ALKPHOS 111 105  BILITOT 0.4 0.6  PROT 6.3* 6.0*  ALBUMIN 2.3* 2.2*   No results for input(s): LIPASE, AMYLASE in the last 168 hours. No results for input(s): AMMONIA in the last 168 hours. CBC: Recent Labs  Lab 05/01/21 1733 05/02/21 0319  WBC 9.6 8.5  NEUTROABS 6.3 4.7  HGB 10.2* 9.7*  HCT 32.7* 30.7*  MCV 85.2 85.3  PLT 508* 491*   Cardiac  Enzymes: No results for input(s): CKTOTAL, CKMB, CKMBINDEX, TROPONINI in the last 168 hours. BNP: Invalid input(s): POCBNP CBG: Recent Labs  Lab 05/01/21 2326 05/02/21 0854  GLUCAP 102* 113*   D-Dimer No results for input(s): DDIMER in the last 72 hours. Hgb A1c No results for input(s): HGBA1C in the last 72 hours. Lipid Profile No results for input(s): CHOL, HDL, LDLCALC, TRIG, CHOLHDL, LDLDIRECT in the last 72 hours. Thyroid function studies No results for input(s): TSH, T4TOTAL, T3FREE, THYROIDAB in the last 72 hours.  Invalid input(s): FREET3 Anemia work up No results for input(s): VITAMINB12, FOLATE, FERRITIN, TIBC, IRON, RETICCTPCT in the last 72 hours. Urinalysis    Component Value Date/Time   COLORURINE STRAW (A) 05/01/2021 2034   APPEARANCEUR CLEAR 05/01/2021 2034   LABSPEC 1.005 05/01/2021 2034   PHURINE 8.0 05/01/2021 2034   GLUCOSEU 50 (A) 05/01/2021 2034   HGBUR NEGATIVE 05/01/2021 2034   BILIRUBINUR NEGATIVE 05/01/2021 2034   KETONESUR 5 (A) 05/01/2021 2034   PROTEINUR NEGATIVE 05/01/2021 2034   NITRITE NEGATIVE 05/01/2021 2034   LEUKOCYTESUR TRACE (A) 05/01/2021 2034   Sepsis Labs Invalid input(s): PROCALCITONIN,  WBC,  LACTICIDVEN Microbiology Recent Results (from the past 240 hour(s))  Blood culture (routine single)     Status: None (Preliminary result)   Collection Time: 05/01/21  5:34 PM   Specimen: BLOOD  Result Value Ref Range Status   Specimen Description BLOOD SITE NOT SPECIFIED  Final   Special Requests   Final    BOTTLES DRAWN AEROBIC AND ANAEROBIC Blood Culture adequate volume   Culture   Final    NO GROWTH < 12 HOURS Performed at [facility], [place] [address] [place], [place] [address]    Report Status PENDING  Incomplete  SARS CORONAVIRUS 2 (TAT 6-24 HRS) Nasopharyngeal Nasopharyngeal Swab     Status: None   Collection Time: 05/01/21  9:30 PM   Specimen: Nasopharyngeal Swab  Result Value Ref Range Status   SARS Coronavirus 2  NEGATIVE NEGATIVE Final    Comment: (NOTE) SARS-CoV-2 target nucleic acids are NOT DETECTED.  The SARS-CoV-2 RNA is generally detectable in upper and lower respiratory specimens during the acute phase of infection. Negative results do not preclude SARS-CoV-2 infection, do not rule out co-infections with other pathogens, and should not be used as the sole basis for treatment or other patient management decisions. Negative results must be combined with clinical observations, patient history, and epidemiological information. The expected result is Negative.  Fact Sheet for Patients: SugarRoll.be  Fact Sheet for Healthcare Providers: https://www.woods-mathews.com/  This test is not yet approved or cleared by the [place] FDA and  has been authorized for detection  and/or diagnosis of SARS-CoV-2 by FDA under an Emergency Use Authorization (EUA). This EUA will remain  in effect (meaning this test can be used) for the duration of the COVID-19 declaration under Se ction 564(b)(1) of the Act, 21 U.S.C. section 360bbb-3(b)(1), unless the authorization is terminated or revoked sooner.  Performed at [facility], [place] [address], [place], [place] [address]      Time coordinating discharge:  28 minutes  SIGNED:   Barb Merino, MD  [facility] 05/02/2021, 1:24 PM

## 2021-05-02 NOTE — Plan of Care (Signed)
Care plan added 

## 2021-05-02 NOTE — Progress Notes (Signed)
Peripherally Inserted Central Catheter Placement  The IV Nurse has discussed with the patient and/or persons authorized to consent for the patient, the purpose of this procedure and the potential benefits and risks involved with this procedure.  The benefits include less needle sticks, lab draws from the catheter, and the patient may be discharged home with the catheter. Risks include, but not limited to, infection, bleeding, blood clot (thrombus formation), and puncture of an artery; nerve damage and irregular heartbeat and possibility to perform a PICC exchange if needed/ordered by physician.  Alternatives to this procedure were also discussed.  Bard Power PICC patient education guide, fact sheet on infection prevention and patient information card has been provided to patient /or left at bedside.    PICC Placement Documentation  PICC Single Lumen 04/08/21 Right Brachial 39 cm 0 cm (Active)  Indication for Insertion or Continuance of Line Home intravenous therapies (PICC only) 04/18/21 1137  Exposed Catheter (cm) 0 cm 05/01/21 2308  Site Assessment Red;Tender 05/01/21 2308  Line Status Saline locked 05/01/21 2308  Dressing Type Transparent;Securing device 05/01/21 2308  Dressing Status Clean;Dry;Intact;Other (Comment) 05/01/21 2308  Antimicrobial disc in place? Yes 05/01/21 2308  Safety Lock Not Applicable 05/01/21 2308  Line Care Connections checked and tightened 05/01/21 2308  Line Adjustment (NICU/IV Team Only) No 05/01/21 2308  Dressing Intervention Dressing changed;Antimicrobial disc changed;Securement device changed;Other (Comment) 04/16/21 1159  Dressing Change Due 05/04/21 05/01/21 2308     PICC Single Lumen 05/02/21 Left Basilic 41 cm 0 cm (Active)  Indication for Insertion or Continuance of Line Home intravenous therapies (PICC only) 05/02/21 1240  Exposed Catheter (cm) 0 cm 05/02/21 1240  Site Assessment Clean;Dry;Intact 05/02/21 1240  Line Status Flushed;Saline locked;Blood  return noted 05/02/21 1240  Dressing Type Transparent;Securing device 05/02/21 1240  Antimicrobial disc in place? Yes 05/02/21 1240  Safety Lock Not Applicable 05/02/21 1240  Dressing Change Due 05/09/21 05/02/21 1240       Romie Jumper 05/02/2021, 12:43 PM

## 2021-05-02 NOTE — Progress Notes (Signed)
Discussed with patient PICC insertion and risks including increased risk for DVT due to already having DVT on right.  Discussed other options for medication, patient agreeable with PICC insertion.  PICC insertion without complications.  When right PICC d/c'd it was noted to be hubbed at insertion site creating a larger hole than originally made from insertion.  Cautioned patient to be aware that it should be at the zero mark, she states that she will instruct home health of this at next dressing change.  Site is also red, no draining noted.

## 2021-05-02 NOTE — Progress Notes (Signed)
Provided discharge education/instructions, all questions and concerns addressed, Pt not in acute distress, due meds given, PICC capped by IV team. Discharged home with belongings accompanied by husband.

## 2021-05-02 NOTE — TOC Transition Note (Signed)
Transition of Care [facility]) - CM/SW Discharge Note   Patient Details  Name: Katie Serrano MRN: [id number] Date of Birth: 07/06/1956  Transition of Care [facility]) CM/SW Contact:  Epifanio Lesches, RN Phone Number: 05/02/2021, 1:29 PM   Clinical Narrative:    Patient will DC to: home Anticipated DC date: 05/02/2021 Family notified: yes Transport by: car  Presented with infection to PICC line in L arm/ acute deep vein thrombosis (DVT) of right upper extremity. Hx of  MRSA bacteremia secondary to UTI, right knee septic arthritis. From home with husband. Active with [facility] and [facility] ( IV ABX therapy).     - s/p  new PICC line placed L arm, 8/26  Per MD patient ready for DC today. RN, patient, patient's family, [facility] and [facility] notified of DC. Resumption orders placed for [facility] services.   Pt without DME needs.  Pt without Rx med concerns.  Post  hospital f/u noted on AVS.  RNCM will sign off for now as intervention is no longer needed. Please consult [place] again if new needs arise  Final next level of care: Home w Home Health Services Barriers to Discharge: No Barriers Identified   Patient Goals and CMS Choice     Choice offered to / list presented to : Patient  Discharge Placement                       Discharge Plan and Services                          HH Arranged: PT, RN [facility] Agency: [facility] Date [facility] Agency Contacted: 05/02/21 Time HH Agency Contacted: [id number] Representative spoke with at [facility] Agency: Kandee Keen  Social Determinants of Health (SDOH) Interventions     Readmission Risk Interventions No flowsheet data found.

## 2021-05-02 NOTE — Plan of Care (Signed)

## 2021-05-06 LAB — CULTURE, BLOOD (SINGLE)
Culture: NO GROWTH
Special Requests: ADEQUATE

## 2021-05-07 ENCOUNTER — Encounter: Payer: Self-pay | Admitting: Registered Nurse

## 2021-05-07 ENCOUNTER — Other Ambulatory Visit: Payer: Self-pay

## 2021-05-07 ENCOUNTER — Encounter: Payer: Medicare Other | Attending: Registered Nurse | Admitting: Registered Nurse

## 2021-05-07 VITALS — BP 172/119 | HR 62 | Temp 98.4°F | Ht 66.0 in | Wt 202.0 lb

## 2021-05-07 DIAGNOSIS — R7881 Bacteremia: Secondary | ICD-10-CM | POA: Insufficient documentation

## 2021-05-07 DIAGNOSIS — B9562 Methicillin resistant Staphylococcus aureus infection as the cause of diseases classified elsewhere: Secondary | ICD-10-CM | POA: Diagnosis present

## 2021-05-07 DIAGNOSIS — Z794 Long term (current) use of insulin: Secondary | ICD-10-CM | POA: Insufficient documentation

## 2021-05-07 DIAGNOSIS — E1165 Type 2 diabetes mellitus with hyperglycemia: Secondary | ICD-10-CM | POA: Insufficient documentation

## 2021-05-07 DIAGNOSIS — I10 Essential (primary) hypertension: Secondary | ICD-10-CM | POA: Diagnosis present

## 2021-05-07 DIAGNOSIS — I6310 Cerebral infarction due to embolism of unspecified precerebral artery: Secondary | ICD-10-CM | POA: Diagnosis not present

## 2021-05-07 MED ORDER — MORPHINE SULFATE ER 30 MG PO TBCR: 30.0000 mg | EXTENDED_RELEASE_TABLET | Freq: Two times a day (BID) | ORAL | 0 refills | Status: DC

## 2021-05-07 NOTE — Progress Notes (Signed)
Subjective:    Patient ID: Katie Serrano, female    DOB: 04/11/1956, 65 y.o.   MRN: [id number]  HPI: Katie PacerZora F Iglesia is a 65 y.o. female who  is here for HFU appointment of her  Myositis, MRSA bacteremia and  Type 2 DM with long-term insulin use. [facility] : see HPI Dr Robb Matarrtiz  HPI: Katie PacerZora F Pernell is a 65 y.o. female with medical history significant of class I obesity, Eagle syndrome, elevated CRP, hypertension, GERD, asthma, tobacco use disorder, COPD, type II DM, hyperlipidemia, osteoarthritis of multiple joints who is coming to the emergency department with complaints of general malaise, decreased appetite, nausea, emesis and fever as high as 105 F.  Her daughter states that she is usually very active, but has been very weak and without energy for the past 4 days.  Her oral intake is decreased.  She is having headaches, myalgias and arthralgias.  No productive cough, rhinorrhea or sore throat.  However, the patient is having intermittent left flank pain radiated to her left lower back.  She stated she has a remote history of urolithiasis and has had urine tract infections in the past.  She has been having occasional angina with her last episode on Friday or Thursday.  No PND, orthopnea or pitting edema of the lower extremities.  No abdominal pain, diarrhea, melena or hematochezia.  No dysuria, frequency or hematuria.  No polyuria, polydipsia, polyphagia or blurred vision. Orthopedic and ID consulted.   MRI : Lumbar IMPRESSION: 1. Joint effusion with surrounding enhancement about the left L4-5 facet, with associated two cystic collections/lesions measuring up to 1.6 cm as above. 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 Possible septic arthritis with associated small collections difficult to exclude, and could be considered in the correct clinical setting. Secondary mass effect on the descending left  L5 nerve root by one of these collections with resultant mild left lateral recess stenosis, which could contribute to left lower extremity symptoms. 2. Abnormal edema and enhancement within the left greater than right lower posterior paraspinous musculature, greatest about the left L4-5 facet. Findings suggestive of acute myositis, which could be either infectious or inflammatory in nature. Possible muscular injury/strain could also be considered in the correct clinical setting. 3. Additional small left foraminal to extraforaminal disc protrusions at L3-4 and L4-5, potentially affecting the exiting left L3 and L4 nerve roots respectively. 4. No other evidence for acute infection within the lumbar spine.   MRI: Brain IMPRESSION: 1. 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 No associated hemorrhage or mass effect. 2. Question diffuse leptomeningeal enhancement involving both cerebral hemispheres as above. Finding not entirely certain, and could be technical in nature on this exam. However, possible meningitis should be considered in the setting of MRSA bacteremia and scattered ischemic infarcts. Correlation with LP and CSF studies recommended for further evaluation. 3. No other acute intracranial abnormality. No other evidence for acute CNS or intraorbital infection. 4. Moderate chronic microvascular ischemic disease.   MRI [place]:  IMPRESSION: 1. 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 No associated hemorrhage or mass effect. 2. Question diffuse leptomeningeal enhancement involving both cerebral hemispheres as above. Finding not entirely certain, and could be technical in nature on this  exam. However, possible meningitis should  be considered in the setting of MRSA bacteremia and scattered ischemic infarcts. Correlation with LP and CSF studies recommended for further evaluation. 3. No other acute intracranial abnormality. No other evidence for acute CNS or intraorbital infection. 4. Moderate chronic microvascular ischemic disease.  Ms. Esteban underwent on 04/05/2021, Dr Linna Caprice      Procedure Laterality Anesthesia  IRRIGATION AND DEBRIDEMENT KNEE        Ms. Boonstra was admitted to inpatient rehabilitation on 04/10/2021 and discharged home on 04/18/2021,She is receiving Home Health Therapy with [facility]. She denies any pain at this time. She rated her pain on Health and History 2.  Also reports she has a good appetite. She is using her walker.   Ms. Zwilling went to ED on 08/25/202: See HPI: She was admitted with Acute DVT: She reports she was instructed not to use her upper extremities.  She arrived to office hypertensive, blood pressure were re-checked several times.  Blood pressure were checked on lower extremities She refuses ED evaluation.   Lower Extremities with anasarca.   Husband in room.   Pain Inventory Average Pain 3 Pain Right Now 2 My pain is aching  In the last 24 hours, has pain interfered with the following? General activity  ? Relation with others  ? Enjoyment of life 5 What TIME of day is your pain at its worst? evening Sleep (in general) Fair  Pain is worse with: some activites Pain improves with: pacing activities and medication Relief from Meds: 7  walk with assistance use a walker how many minutes can you walk? 15 ability to climb steps?  yes do you drive?  no  retired I need assistance with the following:  dressing, bathing, and household duties  trouble walking  Transitions of care  Transitions of care    Family History  Problem Relation Age of Onset   Diabetes Mother    Heart attack Mother 76   Angina Father    Heart attack Father 72    Diabetes Paternal Grandmother    Social History   Socioeconomic History   Marital status: Married    Spouse name: Not on file   Number of children: Not on file   Years of education: Not on file   Highest education level: Not on file  Occupational History   Not on file  Tobacco Use   Smoking status: Every Day    Packs/day: 0.50    Types: Cigarettes   Smokeless tobacco: Never  Vaping Use   Vaping Use: Never used  Substance and Sexual Activity   Alcohol use: Yes    Alcohol/week: 1.0 standard drink    Types: 1 Glasses of wine per week    Comment: Socially   Drug use: Never   Sexual activity: Not on file  Other Topics Concern   Not on file  Social History Narrative   Not on file   Social Determinants of Health   Financial Resource Strain: Not on file  Food Insecurity: Not on file  Transportation Needs: Not on file  Physical Activity: Not on file  Stress: Not on file  Social Connections: Not on file   Past Surgical History:  Procedure Laterality Date   APPENDECTOMY     BUBBLE STUDY  04/04/2021   Procedure: BUBBLE STUDY;  Surgeon: Sande Rives, MD;  Location: [facility] ENDOSCOPY;  Service: Cardiovascular;;   BUNIONECTOMY Right    I & D EXTREMITY Right 04/05/2021   Procedure:  IRRIGATION AND DEBRIDEMENT KNEE;  Surgeon: Samson Frederic, MD;  Location: [facility] OR;  Service: Orthopedics;  Laterality: Right;   LEFT HEART CATH AND CORONARY ANGIOGRAPHY N/A 03/12/2021   Procedure: LEFT HEART CATH AND CORONARY ANGIOGRAPHY;  Surgeon: Lennette Bihari, MD;  Location: [facility] INVASIVE CV LAB;  Service: Cardiovascular;  Laterality: N/A;   NECK SURGERY     SMALL INTESTINE SURGERY     TEE WITHOUT CARDIOVERSION N/A 04/04/2021   Procedure: TRANSESOPHAGEAL ECHOCARDITRRROGRAM (TEE);  Surgeon: Sande Rives, MD;  Location[facility] ENDOSCOPY;  Service: Cardiovascular;  Laterality: N/A;   TONSILLECTOMY     Past Medical History:  Diagnosis Date   Bed sore 04/25/2021   Class 1 obesity due to excess  calories with serious comorbidity and body mass index (BMI) of 30.0 to 30.9 in adult 06/12/2019   Diskitis 04/25/2021   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   Elevated C-reactive protein (CRP) 01/15/2021   Encounter for long-term current use of high risk medication 01/29/2016   Essential hypertension 01/29/2016   Facet arthritis of lumbar region 04/25/2021   Gastroesophageal reflux disease 01/29/2016   Intermittent asthma without complication 01/13/2021   Mixed hyperlipidemia 05/01/2019   Osteoarthritis of multiple joints 01/29/2016   Tobacco use disorder 12/02/2017   Type 2 diabetes mellitus without complication, without long-term current use of insulin (HCC) 01/30/2016   Last Assessment & Plan:  Formatting of this note might be different from the original. Relevant Hx: Course: Daily Update: Today's Plan:discussed with her that her sugars will increase and she has actively worked on her diet and diet and lost from 242 to 188 which I commend her on, she is to continue with her weight loss efforts despite taking this   Electronically signed by: Sandi Mealy   LMP  (LMP Unknown)   Opioid Risk Score:   Fall Risk Score:  `1  Depression screen PHQ 2/9  Depression screen [facility] 2/9 05/07/2021 04/25/2021  Decreased Interest 0 0  Down, Depressed, Hopeless 0 0  PHQ - 2 Score 0 0  Altered sleeping 2 -  Tired, decreased energy 0 -  Change in appetite 0 -  Feeling bad or failure about yourself  0 -  Trouble concentrating 0 -  Moving slowly or fidgety/restless 0 -  Suicidal thoughts 0 -  PHQ-9 Score 2 -    Review of Systems  Constitutional: Negative.   HENT: Negative.    Eyes: Negative.   Respiratory: Negative.    Cardiovascular: Negative.   Gastrointestinal:  Positive for constipation.  Endocrine: Negative.   Genitourinary: Negative.   Musculoskeletal:  Positive for arthralgias, back pain and gait problem.  Skin: Negative.   Allergic/Immunologic: Negative.   Hematological: Negative.    Psychiatric/Behavioral: Negative.    All other systems reviewed and are negative.     Objective:   Physical Exam Vitals and nursing note reviewed.  Constitutional:      Appearance: Normal appearance.  Cardiovascular:     Rate and Rhythm: Normal rate and regular rhythm.     Pulses: Normal pulses.     Heart sounds: Normal heart sounds.  Pulmonary:     Effort: Pulmonary effort is normal.     Breath sounds: Normal breath sounds.  Musculoskeletal:     Cervical back: Normal range of motion and neck supple.     Right lower leg: Edema present.     Left lower leg: Edema present.     Comments: Normal Muscle Bulk and Muscle Testing Reveals:  Upper Extremities:Full  ROM and Muscle Strength  5/5  Lower Extremities: full ROM and Muscle Strength 5/5 Anasarca  Arises from Table slowly using walker for support Narrow Based  Gait     Skin:    General: Skin is warm and dry.  Neurological:     Mental Status: She is alert and oriented to person, place, and time.  Psychiatric:        Mood and Affect: Mood normal.        Behavior: Behavior normal.         Assessment & Plan:  Myositis/MRSA bacteremia: ID Following. Continue current medication regimen. Continue to monitor.   Type 2 DM with long-term insulin use.Continue current medication regimen. PCP Following. Uncontrolled Hypertension: She refuses ED evaluation. Instructed to F/U with her PCP. Continue current medication regimen.  F/U with Dr Riley Kill in 4- 6 weeks

## 2021-05-16 DIAGNOSIS — M009 Pyogenic arthritis, unspecified: Secondary | ICD-10-CM | POA: Insufficient documentation

## 2021-06-04 ENCOUNTER — Ambulatory Visit: Payer: Medicare Other | Admitting: Infectious Disease

## 2021-06-09 ENCOUNTER — Ambulatory Visit: Payer: Medicare Other | Admitting: Infectious Disease

## 2021-06-10 ENCOUNTER — Telehealth: Payer: Self-pay | Admitting: *Deleted

## 2021-06-10 MED ORDER — MORPHINE SULFATE ER 15 MG PO TBCR: 15.0000 mg | EXTENDED_RELEASE_TABLET | Freq: Two times a day (BID) | ORAL | 0 refills | Status: DC

## 2021-06-10 NOTE — Telephone Encounter (Signed)
Katie Serrano called and said she was to tell Katie Serrano when she was ready to decrease her Morphine ER 30 mg to 15 mg.  She states she is ready for the 15 mg now.

## 2021-06-10 NOTE — Telephone Encounter (Signed)
Return Katie Serrano  call, she is tolerating the slow weaning of [name]. PMP and Notes was reviewed.   [name] decreased to 15 mg every 12 hours, she verbalizes understanding. She has a F/U appointment with Dr Riley Kill in November, she verbalizes understanding.

## 2021-06-13 ENCOUNTER — Emergency Department (HOSPITAL_COMMUNITY)
Admission: EM | Admit: 2021-06-13 | Discharge: 2021-06-14 | Disposition: A | Discharge status: Home / Self Care | Payer: Medicare Other | Attending: Emergency Medicine | Admitting: Emergency Medicine

## 2021-06-13 DIAGNOSIS — J449 Chronic obstructive pulmonary disease, unspecified: Secondary | ICD-10-CM | POA: Diagnosis not present

## 2021-06-13 DIAGNOSIS — E119 Type 2 diabetes mellitus without complications: Secondary | ICD-10-CM | POA: Diagnosis not present

## 2021-06-13 DIAGNOSIS — Z7984 Long term (current) use of oral hypoglycemic drugs: Secondary | ICD-10-CM | POA: Insufficient documentation

## 2021-06-13 DIAGNOSIS — L03116 Cellulitis of left lower limb: Secondary | ICD-10-CM | POA: Insufficient documentation

## 2021-06-13 DIAGNOSIS — Z794 Long term (current) use of insulin: Secondary | ICD-10-CM | POA: Diagnosis not present

## 2021-06-13 DIAGNOSIS — F17210 Nicotine dependence, cigarettes, uncomplicated: Secondary | ICD-10-CM | POA: Diagnosis not present

## 2021-06-13 DIAGNOSIS — J45909 Unspecified asthma, uncomplicated: Secondary | ICD-10-CM | POA: Insufficient documentation

## 2021-06-13 DIAGNOSIS — R6510 Systemic inflammatory response syndrome (SIRS) of non-infectious origin without acute organ dysfunction: Secondary | ICD-10-CM

## 2021-06-13 DIAGNOSIS — Z79899 Other long term (current) drug therapy: Secondary | ICD-10-CM | POA: Insufficient documentation

## 2021-06-13 DIAGNOSIS — I2510 Atherosclerotic heart disease of native coronary artery without angina pectoris: Secondary | ICD-10-CM | POA: Insufficient documentation

## 2021-06-13 DIAGNOSIS — I10 Essential (primary) hypertension: Secondary | ICD-10-CM | POA: Diagnosis not present

## 2021-06-13 DIAGNOSIS — L989 Disorder of the skin and subcutaneous tissue, unspecified: Secondary | ICD-10-CM

## 2021-06-13 LAB — COMPREHENSIVE METABOLIC PANEL
ALT: 11 U/L (ref 0–44)
AST: 17 U/L (ref 15–41)
Albumin: 3 g/dL — ABNORMAL LOW (ref 3.5–5.0)
Alkaline Phosphatase: 83 U/L (ref 38–126)
Anion gap: 8 (ref 5–15)
BUN: 7 mg/dL — ABNORMAL LOW (ref 8–23)
CO2: 31 mmol/L (ref 22–32)
Calcium: 9.2 mg/dL (ref 8.9–10.3)
Chloride: 97 mmol/L — ABNORMAL LOW (ref 98–111)
Creatinine, Ser: 0.8 mg/dL (ref 0.44–1.00)
GFR, Estimated: >60 mL/min (ref >60)
Glucose, Bld: 94 mg/dL (ref 70–99)
Potassium: 3.3 mmol/L — ABNORMAL LOW (ref 3.5–5.1)
Sodium: 136 mmol/L (ref 135–145)
Total Bilirubin: 0.7 mg/dL (ref 0.3–1.2)
Total Protein: 7.2 g/dL (ref 6.5–8.1)

## 2021-06-13 LAB — CBC WITH DIFFERENTIAL/PLATELET
Abs Immature Granulocytes: 0.04 10*3/uL (ref 0.00–0.07)
Basophils Absolute: 0.1 10*3/uL (ref 0.0–0.1)
Basophils Relative: 1 %
Eosinophils Absolute: 0.3 10*3/uL (ref 0.0–0.5)
Eosinophils Relative: 3 %
HCT: 41.3 % (ref 36.0–46.0)
Hemoglobin: 12.7 g/dL (ref 12.0–15.0)
Immature Granulocytes: 0 %
Lymphocytes Relative: 23 %
Lymphs Abs: 2.3 10*3/uL (ref 0.7–4.0)
MCH: 25.1 pg — ABNORMAL LOW (ref 26.0–34.0)
MCHC: 30.8 g/dL (ref 30.0–36.0)
MCV: 81.6 fL (ref 80.0–100.0)
Monocytes Absolute: 1 10*3/uL (ref 0.1–1.0)
Monocytes Relative: 10 %
Neutro Abs: 6.4 10*3/uL (ref 1.7–7.7)
Neutrophils Relative %: 63 %
Platelets: 507 10*3/uL — ABNORMAL HIGH (ref 150–400)
RBC: 5.06 MIL/uL (ref 3.87–5.11)
RDW: 15.5 % (ref 11.5–15.5)
WBC: 10.1 10*3/uL (ref 4.0–10.5)
nRBC: 0 % (ref 0.0–0.2)

## 2021-06-13 LAB — URINALYSIS, ROUTINE W REFLEX MICROSCOPIC
Bilirubin Urine: NEGATIVE
Glucose, UA: NEGATIVE mg/dL
Hgb urine dipstick: NEGATIVE
Ketones, ur: NEGATIVE mg/dL
Leukocytes,Ua: NEGATIVE
Nitrite: NEGATIVE
Protein, ur: NEGATIVE mg/dL
Specific Gravity, Urine: 1.014 (ref 1.005–1.030)
pH: 8 (ref 5.0–8.0)

## 2021-06-13 NOTE — ED Provider Notes (Signed)
Emergency Medicine Provider Triage Evaluation Note  Katie Serrano , a 65 y.o. female  was evaluated in triage.  Pt complains of leg swelling and redness.  Review of Systems  Positive: LLE pain and swelling, redness Negative: Fever, trauma  Physical Exam  BP (!) 153/84 (BP Location: Right Arm)   Pulse 71   Temp 98.6 F (37 C) (Oral)   Resp 16   Ht 5\' 6"  (1.676 m)   Wt 78.5 kg   LMP  (LMP Unknown)   SpO2 96%   BMI 27.92 kg/m  Gen:   Awake, no distress   Resp:  Normal effort  MSK:   Moves extremities without difficulty  Other:  LLE erythematous with warmth, several shallow blisters noted to dorsum of foot.  DP pulse detectable with bedside doppler  Medical Decision Making  Medically screening exam initiated at 1:51 PM.  Appropriate orders placed.  Katie Serrano was informed that the remainder of the evaluation will be completed by another provider, this initial triage assessment does not replace that evaluation, and the importance of remaining in the ED until their evaluation is complete.  Hx of septic joint and MRAS here with blister, swelling and redness to LLE x 2 days.  Was concern for infection.  Currently on doxy.     Kara Pacer, PA-C 06/13/21 1400    08/13/21, MD 06/13/21 [phone]

## 2021-06-13 NOTE — ED Triage Notes (Signed)
PT arrives via EMS from home with hx of septic joints and mrsa, now with blisters to left lower extremity, pain and reddness. Denies recent fever. REcently had picc line and antibiotics, pt ccontinues doxycycline. Pt awake, alert, approriate, VSS.

## 2021-06-14 ENCOUNTER — Emergency Department (HOSPITAL_COMMUNITY): Payer: Medicare Other

## 2021-06-14 DIAGNOSIS — L03116 Cellulitis of left lower limb: Secondary | ICD-10-CM | POA: Diagnosis not present

## 2021-06-14 LAB — LACTIC ACID, PLASMA: Lactic Acid, Venous: 1.5 mmol/L (ref 0.5–1.9)

## 2021-06-14 MED ORDER — VANCOMYCIN HCL IN DEXTROSE 1-5 GM/200ML-% IV SOLN: 1000.0000 mg | Freq: Once | INTRAVENOUS | Status: DC

## 2021-06-14 MED ORDER — VANCOMYCIN HCL IN DEXTROSE 1-5 GM/200ML-% IV SOLN
1000.0000 mg | INTRAVENOUS | Status: DC
  Filled 2021-06-14: qty 200

## 2021-06-14 MED ORDER — DEXTROSE 5 % IV SOLN
1500.0000 mg | Freq: Once | INTRAVENOUS | Status: AC
  Administered 2021-06-14: 1500 mg via INTRAVENOUS
  Filled 2021-06-14: qty 75

## 2021-06-14 NOTE — ED Notes (Signed)
Reviewed discharge instructions with patient and spouse. Follow-up care reviewed. Patient and spouse verbalized understanding. Patient A&Ox4, VSS upon discharge. 

## 2021-06-14 NOTE — ED Provider Notes (Addendum)
[facility] EMERGENCY DEPARTMENT Provider Note   CSN: [id number] Arrival date & time: 06/13/21  1301     History No chief complaint on file.   Katie Serrano is a 65 y.o. female.  HPI Patient presents with her husband who assists with history.  She presents with concern of 2 days of new erythema, open wounds on her left dorsal foot.  She is currently on doxycycline.  She started this medication about 2 weeks ago after she had removal of the PICC line through which she was receiving vancomycin.  That was necessary due to an episode of septic arthritis, right knee, with bacteremia.  She notes that beyond that the left foot discomfort, swelling, erythema, she is otherwise well, denies fever, nausea, vomiting, chest pain, dyspnea. She has been seen and evaluated by her ID physicians, primary care, and with this new illness was sent here for evaluation.    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   Elevated C-reactive protein (CRP) 01/15/2021   Encounter for long-term current use of high risk medication 01/29/2016   Essential hypertension 01/29/2016   Facet arthritis of lumbar region 04/25/2021   Gastroesophageal reflux disease 01/29/2016   Intermittent asthma without complication 01/13/2021   Mixed hyperlipidemia 05/01/2019   Osteoarthritis of multiple joints 01/29/2016   Tobacco use disorder 12/02/2017   Type 2 diabetes mellitus without complication, without long-term current use of insulin (HCC) 01/30/2016   Last Assessment & Plan:  Formatting of this note might be different from the original. Relevant Hx: Course: Daily Update: Today's Plan:discussed with her that her sugars will increase and she has actively worked on her diet and diet and lost from 242 to 188 which I  commend her on, she is to continue with her weight loss efforts despite taking this   Electronically signed by: Sandi Mealy    Patient Active Problem List   Diagnosis Date Noted   Acute deep vein thrombosis (DVT) of right upper extremity (HCC) 05/01/2021   COPD (chronic obstructive pulmonary disease) (HCC) 05/01/2021   Mixed diabetic hyperlipidemia associated with type 2 diabetes mellitus (HCC) 05/01/2021   Diskitis 04/25/2021   Bed sore 04/25/2021   Facet arthritis of lumbar region 04/25/2021   Acute blood loss anemia 04/18/2021   Leukocytosis (leucocytosis) 04/18/2021   Type 2 diabetes mellitus with hyperglycemia, with long-term current use of insulin (HCC)    Postoperative pain    Myositis 04/10/2021   Cerebral embolism with cerebral infarction 04/05/2021   Acute pain of right knee    Skin lesion    Vertebral osteophyte    Septic embolism (HCC)    Acute bacterial endocarditis    MRSA bacteremia    Epidural abscess    Weakness of both lower extremities    Acute pyelonephritis 04/01/2021   SIRS (systemic inflammatory response syndrome) (HCC) 03/31/2021   Type 2 diabetes mellitus with hyperglycemia (HCC) 03/31/2021   Hyponatremia 03/31/2021   Coronary artery disease of native artery of native heart with stable angina pectoris (HCC)    Abnormal cardiac CT angiography    Elevated C-reactive protein (CRP) 01/15/2021   Intermittent asthma without complication 01/13/2021   Shortness of breath 01/13/2021   Nicotine dependence, cigarettes, uncomplicated 07/17/2019   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   Class  1 obesity due to excess calories with serious comorbidity and body mass index (BMI) of 30.0 to 30.9 in adult 06/12/2019   Mixed hyperlipidemia 05/01/2019   Tobacco use disorder 12/02/2017   Encounter for long-term current use of high risk medication 01/29/2016   Essential hypertension 01/29/2016   GERD without esophagitis 01/29/2016   Osteoarthritis of multiple joints  01/29/2016    Past Surgical History:  Procedure Laterality Date   APPENDECTOMY     BUBBLE STUDY  04/04/2021   Procedure: BUBBLE STUDY;  Surgeon: Sande Rives, MD;  Location: [facility] ENDOSCOPY;  Service: Cardiovascular;;   BUNIONECTOMY Right    I & D EXTREMITY Right 04/05/2021   Procedure: IRRIGATION AND DEBRIDEMENT KNEE;  Surgeon: Samson Frederic, MD;  Location: [facility] OR;  Service: Orthopedics;  Laterality: Right;   LEFT HEART CATH AND CORONARY ANGIOGRAPHY N/A 03/12/2021   Procedure: LEFT HEART CATH AND CORONARY ANGIOGRAPHY;  Surgeon: Lennette Bihari, MD;  Location: [facility] INVASIVE CV LAB;  Service: Cardiovascular;  Laterality: N/A;   NECK SURGERY     SMALL INTESTINE SURGERY     TEE WITHOUT CARDIOVERSION N/A 04/04/2021   Procedure: TRANSESOPHAGEAL ECHOCARDITRRROGRAM (TEE);  Surgeon: Sande Rives, MD;  Location: [facility] ENDOSCOPY;  Service: Cardiovascular;  Laterality: N/A;   TONSILLECTOMY       OB History   No obstetric history on file.     Family History  Problem Relation Age of Onset   Diabetes Mother    Heart attack Mother 72   Angina Father    Heart attack Father 34   Diabetes Paternal Grandmother     Social History   Tobacco Use   Smoking status: Every Day    Packs/day: 0.50    Types: Cigarettes   Smokeless tobacco: Never  Vaping Use   Vaping Use: Never used  Substance Use Topics   Alcohol use: Yes    Alcohol/week: 1.0 standard drink    Types: 1 Glasses of wine per week    Comment: Socially   Drug use: Never    Home Medications Prior to Admission medications   Medication Sig Start Date End Date Taking? Authorizing Provider  albuterol (VENTOLIN HFA) 108 (90 Base) MCG/ACT inhaler Inhale 2 puffs into the lungs every 6 (six) hours as needed for wheezing or shortness of breath. 12/02/17   [provider]  Cinnamon 500 MG capsule Take 500 mg by mouth 2 (two) times a week.    [provider]  doxycycline (VIBRA-TABS) 100 MG tablet Take 1 tablet (100  mg total) by mouth 2 (two) times daily. Patient not taking: Reported on 05/07/2021 04/25/21   Daiva Eves, Lisette Grinder, MD  fluticasone [facility]) 50 MCG/ACT nasal spray Place 2 sprays into both nostrils daily. Patient taking differently: Place 2 sprays into both nostrils daily as needed for allergies. 04/18/21   Love, Evlyn Kanner, PA-C  Insulin Pen Needle 32G X 4 MM MISC Use as directed 4 (four) times daily. 04/18/21   Love, Evlyn Kanner, PA-C  losartan (COZAAR) 50 MG tablet Take 1 tablet (50 mg total) by mouth at bedtime. Patient taking differently: Take 50 mg by mouth daily. 04/18/21   Love, Evlyn Kanner, PA-C  metFORMIN (GLUCOPHAGE-XR) 500 MG 24 hr tablet Take 500 mg by mouth in the morning and at bedtime. Patient not taking: Reported on 05/07/2021    [provider]  metoprolol tartrate (LOPRESSOR) 50 MG tablet Take 1 tablet (50 mg total) by mouth 2 (two) times daily. 04/18/21   Love,  Evlyn Kanner, PA-C  morphine ([name]) 15 MG 12 hr tablet Take 1 tablet (15 mg total) by mouth every 12 (twelve) hours. 06/10/21   Jones Bales, NP  Multiple Vitamin (MULTIVITAMIN) capsule Take 1 capsule by mouth daily as needed (nutrition).    [provider]  nitroGLYCERIN (NITROSTAT) 0.4 MG SL tablet Place 1 tablet (0.4 mg total) under the tongue every 5 (five) minutes as needed. 03/06/21 06/04/21  Baldo Daub, MD  Omega-3 1000 MG CAPS Take 1,000 mg by mouth daily.    [provider]  oxyCODONE (OXY IR/ROXICODONE) 5 MG immediate release tablet Take 1 tablet (5 mg total) by mouth 2 (two) times daily as needed for moderate pain or severe pain. Patient not taking: Reported on 05/07/2021 04/22/21   Ranelle Oyster, MD  pravastatin (PRAVACHOL) 20 MG tablet Take 20 mg by mouth every evening.    [provider]  Rivaroxaban (XARELTO) 15 MG TABS tablet Take 15 mg by mouth 2 (two) times daily with a meal.    [provider]  SEMGLEE, YFGN, 100 UNIT/ML Pen Inject 14 Units into the skin in the  morning. 04/18/21   [provider]    Allergies    Patient has no known allergies.  Review of Systems   Review of Systems  Constitutional:        Per HPI, otherwise negative  HENT:         Per HPI, otherwise negative  Respiratory:         Per HPI, otherwise negative  Cardiovascular:        Per HPI, otherwise negative  Gastrointestinal:  Negative for vomiting.  Endocrine:       Negative aside from HPI  Genitourinary:        Neg aside from HPI   Musculoskeletal:        Per HPI, otherwise negative  Skin:  Positive for color change and wound.  Neurological:  Negative for syncope.   Physical Exam Updated Vital Signs BP (!) 185/80 (BP Location: Left Arm)   Pulse 85   Temp 98.4 F (36.9 C) (Oral)   Resp 17   Ht 5\' 6"  (1.676 m)   Wt 78.5 kg   LMP  (LMP Unknown)   SpO2 98%   BMI 27.92 kg/m   Physical Exam Vitals and nursing note reviewed.  Constitutional:      General: She is not in acute distress.    Appearance: She is well-developed.  HENT:     Head: Normocephalic and atraumatic.  Eyes:     Conjunctiva/sclera: Conjunctivae normal.  Cardiovascular:     Rate and Rhythm: Normal rate and regular rhythm.  Pulmonary:     Effort: Pulmonary effort is normal. No respiratory distress.     Breath sounds: Normal breath sounds. No stridor.  Abdominal:     General: There is no distension.  Skin:    General: Skin is warm and dry.       Neurological:     Mental Status: She is alert and oriented to person, place, and time.     Cranial Nerves: No cranial nerve deficit.     Comments: Distal L foot and ankle wnl neurovasc    ED Results / Procedures / Treatments   Labs (all labs ordered are listed, but only abnormal results are displayed) Labs Reviewed  COMPREHENSIVE METABOLIC PANEL - Abnormal; Notable for the following components:      Result Value   Potassium 3.3 (*)  Chloride 97 (*)    BUN 7 (*)    Albumin 3.0 (*)    All other components within normal  limits  CBC WITH DIFFERENTIAL/PLATELET - Abnormal; Notable for the following components:   MCH 25.1 (*)    Platelets 507 (*)    All other components within normal limits  URINALYSIS, ROUTINE W REFLEX MICROSCOPIC - Abnormal; Notable for the following components:   APPearance HAZY (*)    All other components within normal limits  LACTIC ACID, PLASMA  LACTIC ACID, PLASMA    EKG None  Radiology DG Foot Complete Left  Result Date: 06/14/2021 CLINICAL DATA:  65 year old female with foot wounds. Query osteomyelitis. EXAM: LEFT FOOT - COMPLETE 3+ VIEW COMPARISON:  Left toe series 01/12/2012. FINDINGS: Distal foot soft tissue swelling. No soft tissue gas identified. No fracture, dislocation, or osteolysis identified. Calcaneus degenerative spurring. IMPRESSION: Soft tissue swelling with no radiographic evidence of osteomyelitis. Electronically Signed   By: Odessa Fleming M.D.   On: 06/14/2021 09:08    Procedures Procedures   Medications Ordered in ED Medications  dalbavancin (DALVANCE) 1,500 mg in dextrose 5 % 500 mL IVPB (0 mg Intravenous Stopped 06/14/21 1057)    ED Course  I have reviewed the triage vital signs and the nursing notes.  Pertinent labs & imaging results that were available during my care of the patient were reviewed by me and considered in my medical decision making (see chart for details).  Repeat exam patient is awake, alert, no distress.  With concern for her ongoing oral antibiotic use, with breakthrough evidence for new infection we discussed indication for vancomycin, admission for cellulitis, with anticipated ID follow-up.  Patient has a strong preference for discharge to follow-up as an outpatient given her multiple recent hospitalizations, long-term antibiotic use.  Patient has capacity make this request.  I discussed her case with our pharmacy colleagues, patient will start dalbavancin, follow-up with ID.  No early evidence for bacteremia, sepsis, but with cellulitis, in  the context of recent antibiotic use and ongoing doxycycline therapy, patient does require initiation of this medication, close outpatient follow-up.  Final Clinical Impression(s) / ED Diagnoses Final diagnoses:  Cellulitis of left lower extremity    Rx / DC Orders ED Discharge Orders          Ordered    Ambulatory referral to Infectious Disease       Comments: Cellulitis patient:  Received dalbavancin on 06/14/2021.   06/14/21 9702             Gerhard Munch, MD 06/14/21 1231    Gerhard Munch, MD 06/14/21 1232

## 2021-06-14 NOTE — ED Notes (Signed)
Pt transported to Xray. 

## 2021-06-14 NOTE — Discharge Instructions (Addendum)
Please be sure to follow-up with both your primary care physician and your infectious disease team.  You should be contacted on Monday to follow-up in the ID clinic.  Return here for concerning changes in your condition.

## 2021-06-14 NOTE — Progress Notes (Signed)
Pharmacy Note:  Dalbavancin for Acute Bacterial Skin and Skin Structure Infection (ABSSSI) Patients to [facility] Discharge Katie Serrano is an 65 y.o. female who presented to [facility] on 06/13/2021 with an Acute Bacterial Skin and Skin Structure Infection  Inclusion criteria - Indication []  Moderately large skin lesion (>=75 cm2 or larger - about the size of a baseball) [x]  Cellulitis  Inclusion Criteria - at least one SIRS criteria present []  WBC > 12,000 or < 4000 []  temp >100.9 or < 96.8 []  heart rate >90[]  respiratory rate >20  Other: Failure of outpatient antibiotics and patient refusal for inpatient admission. History of MRSA septic knee join  Patient was evaluated for the following exclusion criteria and no exclusions were found  Hardware involvement, Hypotension / shock, Elevated lactate (>2) without other explanation, ram-negative infection risk factors (bites, water exposure, infection after trauma, infection after skin graft, neutropenia, burns, severe immunocompromise), necrotizing fasciitis possible or confirmed, Known or suspected osteomyelitis or septic arthritis, endocarditis, diabetic foot infection, ischemic ulcers, post-operative wound infection, perirectal infections, need for drainage in the operating room, hand or facial infections, injection drug users with a fever, bacteremia, pregnancy or breastfeeding, allergy to related antibiotics like vancomycin, known liver disease (t.bili >2x ULN or AST/ALT 3x ULN)  Thank you for allowing pharmacy to be a part of this patient's care.  , PharmD Clinical Pharmacist  Please check AMION for all [facility] Pharmacy numbers After 10:00 PM, call Main Pharmacy [phone]

## 2021-06-14 NOTE — ED Notes (Signed)
Pt back from X-ray.  

## 2021-06-17 ENCOUNTER — Ambulatory Visit: Payer: Medicare Other | Admitting: Cardiology

## 2021-06-18 ENCOUNTER — Ambulatory Visit: Payer: Medicare Other | Admitting: Cardiology

## 2021-06-19 ENCOUNTER — Other Ambulatory Visit: Payer: Self-pay | Admitting: Infectious Disease

## 2021-06-20 NOTE — Telephone Encounter (Signed)
Has appt 10/17

## 2021-06-23 ENCOUNTER — Ambulatory Visit (INDEPENDENT_AMBULATORY_CARE_PROVIDER_SITE_OTHER): Payer: Medicare Other | Admitting: Infectious Disease

## 2021-06-23 ENCOUNTER — Encounter: Payer: Self-pay | Admitting: Infectious Disease

## 2021-06-23 ENCOUNTER — Other Ambulatory Visit: Payer: Self-pay

## 2021-06-23 VITALS — BP 161/89 | HR 67 | Temp 97.4°F | Ht 66.0 in | Wt 173.0 lb

## 2021-06-23 DIAGNOSIS — M009 Pyogenic arthritis, unspecified: Secondary | ICD-10-CM

## 2021-06-23 DIAGNOSIS — L97509 Non-pressure chronic ulcer of other part of unspecified foot with unspecified severity: Secondary | ICD-10-CM

## 2021-06-23 DIAGNOSIS — R600 Localized edema: Secondary | ICD-10-CM

## 2021-06-23 DIAGNOSIS — L97522 Non-pressure chronic ulcer of other part of left foot with fat layer exposed: Secondary | ICD-10-CM | POA: Diagnosis not present

## 2021-06-23 DIAGNOSIS — M4646 Discitis, unspecified, lumbar region: Secondary | ICD-10-CM

## 2021-06-23 DIAGNOSIS — M47816 Spondylosis without myelopathy or radiculopathy, lumbar region: Secondary | ICD-10-CM

## 2021-06-23 DIAGNOSIS — G062 Extradural and subdural abscess, unspecified: Secondary | ICD-10-CM

## 2021-06-23 DIAGNOSIS — B9562 Methicillin resistant Staphylococcus aureus infection as the cause of diseases classified elsewhere: Secondary | ICD-10-CM

## 2021-06-23 DIAGNOSIS — R7881 Bacteremia: Secondary | ICD-10-CM | POA: Diagnosis not present

## 2021-06-23 DIAGNOSIS — I6310 Cerebral infarction due to embolism of unspecified precerebral artery: Secondary | ICD-10-CM

## 2021-06-23 DIAGNOSIS — M00061 Staphylococcal arthritis, right knee: Secondary | ICD-10-CM

## 2021-06-23 DIAGNOSIS — I10 Essential (primary) hypertension: Secondary | ICD-10-CM | POA: Diagnosis not present

## 2021-06-23 HISTORY — DX: Localized edema: R60.0

## 2021-06-23 HISTORY — DX: Pyogenic arthritis, unspecified: M00.9

## 2021-06-23 HISTORY — DX: Non-pressure chronic ulcer of other part of unspecified foot with unspecified severity: L97.509

## 2021-06-23 NOTE — Progress Notes (Signed)
Subjective:  . Complaints: She has developed ulceration over her left foot and also had had erythema in her right leg thought to be cellulitis.     Patient ID: Katie Serrano, female    DOB: [birth year], 65 y.o.   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 Veda Canning.  Is on course to complete vancomycin on September 14.  Her back pain is improved dramatically since she was in the hospital she still needs opiates to control the pain at times but less so than she did when she was hospitalized.  Knee pain is improved as well as has range of motion.  She is continue on vancomycin currently 1 g twice daily.  Most recent labs show her vancomycin trough at 14.3.  Her sedimentation rate is encouraging having gone down from a value of 93 in the hospital to 21 on most recent labs performed 3 days ago   Is an area on her left thigh that she and her husband asked me to look at her last visit that did not appear infected.  Apparently since then she has developed areas of what sound like and look like on based on pictures bullae that then have ruptured and left her with ulcers.  She went to the ER due to several of these lesions in her left foot as well as erythema in her right leg.  She was evaluated and given a dose of dalbavancin and referred back to our clinic for follow-up.  Since then she has not been on other antibiotics besides the doxycycline.  I wonder if these bullous lesions have more to do with her edema and the need to  better control that with more aggressive diuresis.  Knee pain is stable her back pain is also relatively stable it does bother her sometimes when she puts more stress on it.      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   Elevated C-reactive protein (CRP) 01/15/2021   Encounter for long-term current use of high risk medication 01/29/2016   Essential hypertension 01/29/2016   Facet arthritis of lumbar region 04/25/2021   Gastroesophageal reflux disease 01/29/2016   Intermittent asthma without complication 01/13/2021   Mixed hyperlipidemia 05/01/2019   Osteoarthritis of multiple joints 01/29/2016   Tobacco use disorder 12/02/2017   Type 2 diabetes mellitus without complication, without long-term current use of insulin (HCC) 01/30/2016   Last Assessment & Plan:  Formatting of this note might be different from the original. Relevant Hx: Course: Daily Update: Today's Plan:discussed with her that her sugars will increase and she has actively worked on her diet and diet and lost from 242 to 188 which I commend her on, she is to continue with her weight loss efforts despite taking this   Electronically signed by: Sandi Mealy    Past Surgical History:  Procedure Laterality  Date   APPENDECTOMY     BUBBLE STUDY  04/04/2021   Procedure: BUBBLE STUDY;  Surgeon: Sande Rives, MD;  Location: [facility] ENDOSCOPY;  Service: Cardiovascular;;   BUNIONECTOMY Right    I & D EXTREMITY Right 04/05/2021   Procedure: IRRIGATION AND DEBRIDEMENT KNEE;  Surgeon: Samson Frederic, MD;  Location: [facility] OR;  Service: Orthopedics;  Laterality: Right;   LEFT HEART CATH AND CORONARY ANGIOGRAPHY N/A 03/12/2021   Procedure: LEFT HEART CATH AND CORONARY ANGIOGRAPHY;  Surgeon: Lennette Bihari, MD;  Location: [facility]  INVASIVE CV LAB;  Service: Cardiovascular;  Laterality: N/A;   NECK SURGERY     SMALL INTESTINE SURGERY     TEE WITHOUT CARDIOVERSION N/A 04/04/2021   Procedure: TRANSESOPHAGEAL ECHOCARDITRRROGRAM (TEE);  Surgeon: Sande Rives, MD;  Location: [facility] ENDOSCOPY;  Service: Cardiovascular;  Laterality: N/A;   TONSILLECTOMY      Family History  Problem Relation Age of Onset   Diabetes Mother    Heart attack Mother 31   Angina Father    Heart attack Father 53   Diabetes Paternal Grandmother       Social History   Socioeconomic History   Marital status: Married    Spouse name: Not on file   Number of children: Not on file   Years of education: Not on file   Highest education level: Not on file  Occupational History   Not on file  Tobacco Use   Smoking status: Every Day    Packs/day: 0.50    Types: Cigarettes   Smokeless tobacco: Never  Vaping Use   Vaping Use: Never used  Substance and Sexual Activity   Alcohol use: Yes    Alcohol/week: 1.0 standard drink    Types: 1 Glasses of wine per week    Comment: Socially   Drug use: Never   Sexual activity: Not on file  Other Topics Concern   Not on file  Social History Narrative   Not on file   Social Determinants of Health   Financial Resource Strain: Not on file  Food Insecurity: Not on file  Transportation Needs: Not on file  Physical Activity: Not on file  Stress: Not on file  Social Connections: Not on file    No Known Allergies   Current Outpatient Medications:    albuterol (VENTOLIN HFA) 108 (90 Base) MCG/ACT inhaler, Inhale 2 puffs into the lungs every 6 (six) hours as needed for wheezing or shortness of breath., Disp: , Rfl:    Cinnamon 500 MG capsule, Take 500 mg by mouth 2 (two) times a week., Disp: , Rfl:    doxycycline (VIBRA-TABS) 100 MG tablet, Take 1 tablet (100 mg total) by mouth 2 (two) times daily., Disp: 60 tablet, Rfl: 1   fluticasone (FLONASE) 50 MCG/ACT nasal spray, Place 2 sprays into both  nostrils daily. (Patient taking differently: Place 2 sprays into both nostrils daily as needed for allergies.), Disp: 16 g, Rfl: 0   hydrochlorothiazide (MICROZIDE) 12.5 MG capsule, Take 12.5 mg by mouth every morning., Disp: , Rfl:    Insulin Pen Needle 32G X 4 MM MISC, Use as directed 4 (four) times daily., Disp: 200 each, Rfl: 0   losartan (COZAAR) 50 MG tablet, Take 1 tablet (50 mg total) by mouth at bedtime. (Patient taking differently: Take 50 mg by mouth daily.), Disp: 30 tablet, Rfl: 0   metFORMIN (GLUCOPHAGE-XR) 500 MG 24 hr tablet, Take 500 mg by mouth in the morning and at bedtime., Disp: , Rfl:  metoprolol tartrate (LOPRESSOR) 50 MG tablet, Take 1 tablet (50 mg total) by mouth 2 (two) times daily., Disp: 60 tablet, Rfl: 0   morphine ([name]) 15 MG 12 hr tablet, Take 1 tablet (15 mg total) by mouth every 12 (twelve) hours., Disp: 60 tablet, Rfl: 0   Multiple Vitamin (MULTIVITAMIN) capsule, Take 1 capsule by mouth daily as needed (nutrition)., Disp: , Rfl:    Omega-3 1000 MG CAPS, Take 1,000 mg by mouth daily., Disp: , Rfl:    oxyCODONE (OXY IR/ROXICODONE) 5 MG immediate release tablet, Take 1 tablet (5 mg total) by mouth 2 (two) times daily as needed for moderate pain or severe pain., Disp: 20 tablet, Rfl: 0   pravastatin (PRAVACHOL) 20 MG tablet, Take 20 mg by mouth every evening., Disp: , Rfl:    Rivaroxaban (XARELTO) 15 MG TABS tablet, Take 15 mg by mouth 2 (two) times daily with a meal., Disp: , Rfl:    SEMGLEE, YFGN, 100 UNIT/ML Pen, Inject 14 Units into the skin in the morning., Disp: , Rfl:    nitroGLYCERIN (NITROSTAT) 0.4 MG SL tablet, Place 1 tablet (0.4 mg total) under the tongue every 5 (five) minutes as needed., Disp: 25 tablet, Rfl: 3   Review of Systems     Objective:   Physical Exam  Right knee 04/25/2021:    Right knee 06/23/2021:    Right leg 06/23/2021:    Area over toes where she says she had also some bullae June 23, 2021:     Left foot  with ulcerations pictured below June 23, 2021:         Assessment & Plan:   Ulcerations of her left foot which appear consistent with a ruptured bullae:  She may have had infection of this with streptococcal species that would not be covered by her doxycycline but I also wonder if the underlying issue is not infection but rather dealing with edema and her developing bullous lesions in the context of lower extremity edema.  I have scheduled her to see my partner Dr. Elinor Parkinson next week to ensure these areas of ulceration are not showing evidence of progressive infection.  Right lower extremity "cellulitis" not overly concerned by its current appearance.  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  We had to continue doxycycline since then.  Knee pain is improved as has back pain which is stable  We will recheck sed rate CRP.  Continue doxycycline for now and reschedule with me after she sees Dr. Elinor Parkinson

## 2021-06-24 LAB — COMPLETE METABOLIC PANEL WITH GFR
AG Ratio: 1 (calc) (ref 1.0–2.5)
ALT: 9 U/L (ref 6–29)
AST: 14 U/L (ref 10–35)
Albumin: 3.6 g/dL (ref 3.6–5.1)
Alkaline phosphatase (APISO): 85 U/L (ref 37–153)
BUN: 11 mg/dL (ref 7–25)
CO2: 29 mmol/L (ref 20–32)
Calcium: 9.3 mg/dL (ref 8.6–10.4)
Chloride: 100 mmol/L (ref 98–110)
Creat: 0.8 mg/dL (ref 0.50–1.05)
Globulin: 3.5 g/dL (calc) (ref 1.9–3.7)
Glucose, Bld: 149 mg/dL — ABNORMAL HIGH (ref 65–99)
Potassium: 4.3 mmol/L (ref 3.5–5.3)
Sodium: 136 mmol/L (ref 135–146)
Total Bilirubin: 0.3 mg/dL (ref 0.2–1.2)
Total Protein: 7.1 g/dL (ref 6.1–8.1)
eGFR: 81 mL/min/{1.73_m2} (ref >=60)

## 2021-06-24 LAB — SEDIMENTATION RATE: Sed Rate: 28 mm/h (ref 0–30)

## 2021-06-24 LAB — CBC WITH DIFFERENTIAL/PLATELET
Absolute Monocytes: 901 cells/uL (ref 200–950)
Basophils Absolute: 68 cells/uL (ref 0–200)
Basophils Relative: 0.8 %
Eosinophils Absolute: 629 cells/uL — ABNORMAL HIGH (ref 15–500)
Eosinophils Relative: 7.4 %
HCT: 39.9 % (ref 35.0–45.0)
Hemoglobin: 11.9 g/dL (ref 11.7–15.5)
Lymphs Abs: 1666 cells/uL (ref 850–3900)
MCH: 24.5 pg — ABNORMAL LOW (ref 27.0–33.0)
MCHC: 29.8 g/dL — ABNORMAL LOW (ref 32.0–36.0)
MCV: 82.3 fL (ref 80.0–100.0)
MPV: 9.8 fL (ref 7.5–12.5)
Monocytes Relative: 10.6 %
Neutro Abs: 5236 cells/uL (ref 1500–7800)
Neutrophils Relative %: 61.6 %
Platelets: 474 10*3/uL — ABNORMAL HIGH (ref 140–400)
RBC: 4.85 10*6/uL (ref 3.80–5.10)
RDW: 14.8 % (ref 11.0–15.0)
Total Lymphocyte: 19.6 %
WBC: 8.5 10*3/uL (ref 3.8–10.8)

## 2021-06-24 LAB — C-REACTIVE PROTEIN: CRP: 8 mg/L — ABNORMAL HIGH (ref <8.0)

## 2021-06-25 DIAGNOSIS — E538 Deficiency of other specified B group vitamins: Secondary | ICD-10-CM | POA: Insufficient documentation

## 2021-06-30 NOTE — Progress Notes (Signed)
Cardiology Office Note:    Date:  07/01/2021   ID:  Katie Serrano, DOB 1955/10/11, MRN [id number]  PCP:  Gordan Payment., MD  Cardiologist:  Norman Herrlich, MD    Referring MD: Gordan Payment., MD    ASSESSMENT:    1. Coronary artery disease of native artery of native heart with stable angina pectoris (HCC)   2. Type 2 diabetes mellitus without complication, without long-term current use of insulin (HCC)   3. Essential hypertension   4. Mixed hyperlipidemia   5. Chronic anticoagulation    PLAN:    In order of problems listed above:  Doing well with CAD no angina on current medical therapy continue her beta-blocker and statin when she transitions off Xarelto restart aspirin 81 mg daily Stable managed by her PCP Continue current treatment I asked her to check home blood pressures and trend Continue her statin with CAD He tells me she is to take 3 months of anticoagulant and then will get aspirin 81 daily with CAD   Next appointment: 1 year   Medication Adjustments/Labs and Tests Ordered: Current medicines are reviewed at length with the patient today.  Concerns regarding medicines are outlined above.  No orders of the defined types were placed in this encounter.  No orders of the defined types were placed in this encounter.   Chief Complaint  Patient presents with   Follow-up   Coronary Artery Disease    History of Present Illness:    Katie Serrano is a 65 y.o. female with a hx of cigarette smoking type 2 diabetes hyperlipidemia elevated C-reactive protein and exertional chest pain last seen 03/06/2021.  Cardiac CTA showed multivessel CAD I advised coronary angiography she chose ongoing medical therapy.  She underwent coronary angiography showing total occlusion of the proximal right coronary artery and treated medically.  She had a TEE performed 04/04/2021 for MRSA bacteremia with CNS embolism showing no findings of vegetation or infective endocarditis and she is  anticoagulated for upper extremity thrombophlebitis. Compliance with diet, lifestyle and medications: Yes She has done well with medical therapy having no angina and when she discontinues her anticoagulant she will go back to aspirin 81 mg daily continue her beta-blocker and lipid-lowering with pravastatin.  TEE 05/01/2021:  1. Left ventricular ejection fraction, by estimation, is 55 to 60%. The  left ventricle has normal function.   2. Right ventricular systolic function is normal. The right ventricular  size is normal.   3. No left atrial/left atrial appendage thrombus was detected.   4. The mitral valve is grossly normal. Mild mitral valve regurgitation.  No evidence of mitral stenosis.   5. The aortic valve is tricuspid. Aortic valve regurgitation is not  visualized. No aortic stenosis is present.   6. There is mild (Grade II) layered plaque involving the descending  aorta.   7. Agitated saline contrast bubble study was negative, with no evidence  of any interatrial shunt.   Cardiac CTA 03/05/2021: FINDINGS: A 100 kV prospective scan was triggered in the descending thoracic aorta at 111 HU's. Axial non-contrast 3 mm slices were carried out through the heart. The data set was analyzed on a dedicated work station and scored using the Agatson method. Gantry rotation speed was 250 msecs and collimation was .6 mm. No beta blockade and 0.8 mg of sl NTG was given. The 3D data set was reconstructed in 5% intervals of the 67-82 % of the R-R cycle. Diastolic phases were analyzed on a  dedicated work station using MPR, MIP and VRT modes. The patient received 95 cc of contrast.   Aorta:  Normal size.  No calcifications.  No dissection.   Main Pulmonary Artery: Normal size of the pulmonary artery.   Aortic Valve:  Tri-leaflet.  No calcifications.   Coronary Arteries:  Normal coronary origin.  Co-dominance.   Coronary Calcium Score:   Left main: 0   Left anterior descending artery:  131   Left circumflex artery: 20   Right coronary artery: 10   Total: 161   Percentile: 86th for age, sex, and race matched control.   RCA is a large co-dominant artery that gives rise to PDA and PLA. There is appears to be a severe stenosis 70-99% in the proximal vessel.   Left main is a large artery that gives rise to LAD and LCX arteries. There is no plaque.   LAD is a large vessel that gives rise to one large D1 Branch. There is a mixed plaque moderate stenosis (50-70%) in the mid LAD. There is a mild non-obstructive (25-49%) calcified plaque in the distal LAD.   LCX is a co-dominant artery that gives rise to one large OM1 branch. There is a mild-non obstructive (24-49%) non calcified plaque the mid LCX.   Other findings:   Normal pulmonary vein drainage into the left atrium.   Normal left atrial appendage without a thrombus.   Extra-cardiac findings: See attached radiology report for non-cardiac structures.   IMPRESSION: 1. Coronary calcium score of 161. This was 86th percentile for age, sex, and race matched control.   2. Normal coronary origin with co-dominance.   3. CAD-RADS 4 Severe stenosis. (70-99% or > 50% left main). CT-FFR will be sent as time sensitive. Consider symptom-guided anti-ischemic pharmacotherapy as well as risk factor modification per guideline directed care. Cardiac catheterization may be appropriate.  Left heart cath:03/12/2021 Conclusion Prox RCA lesion is 100% stenosed. Prox LAD lesion is 45% stenosed. 2nd Diag lesion is 50% stenosed. Mid LAD lesion is 50% stenosed. Dist LAD lesion is 50% stenosed. Prox Cx lesion is 60% stenosed. 2nd Mrg-1 lesion is 50% stenosed. 2nd Mrg-2 lesion is 60% stenosed. Mid Cx to Dist Cx lesion is 20% stenosed. Dist RCA lesion is 50% stenosed. The left ventricular ejection fraction is greater than 65% by visual estimate. The left ventricular systolic function is normal. LV end diastolic pressure is  mildly elevated. Dist Cx lesion is 30% stenosed.   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   Dynamic LV function with EF estimated least 65 to 70%.  There were no focal segmental wall motion abnormalities.  LVEDP 19 mmHg.   RECOMMENDATION: Medical therapy trial.  The patient was hypertensive on presentation.  I have elected to add amlodipine 5 mg to her medical regimen which would be helpful both for hypertension and anti-ischemic benefit.  With her high vessel coronary plaque recommend aggressive high potency statin therapy and have suggested discontinuance of low-dose pravastatin and change to atorvastatin 80 mg.  Consider outpatient titration of metoprolol blood pressure and heart rate allow.  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   Elevated C-reactive protein (CRP) 01/15/2021   Encounter for long-term current use of high risk medication 01/29/2016   Essential hypertension 01/29/2016   Facet arthritis of lumbar region 04/25/2021   Foot ulcer (HCC) 06/23/2021   Gastroesophageal reflux disease 01/29/2016   Intermittent asthma without complication 01/13/2021   Lower extremity edema 06/23/2021   Mixed hyperlipidemia 05/01/2019   Osteoarthritis of multiple joints 01/29/2016   Tobacco use disorder 12/02/2017   Type 2 diabetes  mellitus without complication, without long-term current use of insulin (HCC) 01/30/2016   Last Assessment & Plan:  Formatting of this note might be different from the original. Relevant Hx: Course: Daily Update: Today's Plan:discussed with her that her sugars will increase and she has actively worked on her diet and diet and lost from 242 to 188 which I commend her on, she is to continue with her weight loss efforts despite taking this   Electronically signed by: Sandi Mealy    Past Surgical History:  Procedure Laterality Date   APPENDECTOMY     BUBBLE STUDY  04/04/2021   Procedure: BUBBLE STUDY;  Surgeon: Sande Rives, MD;  Location: [facility] ENDOSCOPY;  Service: Cardiovascular;;   BUNIONECTOMY Right    I & D EXTREMITY Right 04/05/2021   Procedure: IRRIGATION AND DEBRIDEMENT KNEE;  Surgeon: Samson Frederic, MD;  Location: [facility] OR;  Service: Orthopedics;  Laterality: Right;   LEFT HEART CATH AND CORONARY ANGIOGRAPHY N/A 03/12/2021   Procedure: LEFT HEART CATH AND CORONARY ANGIOGRAPHY;  Surgeon: Lennette Bihari, MD;  Location: [facility] INVASIVE CV LAB;  Service: Cardiovascular;  Laterality: N/A;   NECK SURGERY     SMALL INTESTINE SURGERY     TEE WITHOUT CARDIOVERSION N/A 04/04/2021   Procedure: TRANSESOPHAGEAL ECHOCARDITRRROGRAM (TEE);  Surgeon: Sande Rives, MD;  Location: [facility] ENDOSCOPY;  Service: Cardiovascular;  Laterality: N/A;   TONSILLECTOMY      Current Medications: Current Meds  Medication Sig   albuterol (VENTOLIN HFA) 108 (90 Base) MCG/ACT inhaler Inhale 2 puffs into the lungs every 6 (six) hours as needed for wheezing or shortness of breath.   Cinnamon 500 MG capsule Take 500 mg by mouth 2 (two) times a week.   doxycycline (VIBRA-TABS) 100 MG tablet TAKE 1 TABLET BY MOUTH TWICE A DAY   fluticasone (FLONASE) 50 MCG/ACT nasal spray Place 2 sprays into both nostrils daily. (Patient taking differently: Place 2 sprays into both nostrils daily as needed for allergies.)    hydrochlorothiazide (MICROZIDE) 12.5 MG capsule Take 12.5 mg by mouth every morning.   Insulin Pen Needle 32G X 4 MM MISC Use as directed 4 (four) times daily.   losartan (COZAAR) 50 MG tablet Take 1 tablet (50 mg total) by mouth at bedtime. (Patient taking differently: Take 50 mg by mouth daily.)   metFORMIN (GLUCOPHAGE-XR) 500 MG 24 hr tablet Take 500 mg by mouth in the morning and at bedtime.   metoprolol tartrate (LOPRESSOR) 25 MG tablet Take 25 mg by mouth 2 (two) times daily.   morphine ([name]) 15 MG 12 hr tablet Take 1 tablet (15 mg total) by mouth every 12 (twelve) hours.   Multiple Vitamin (MULTIVITAMIN) capsule Take 1 capsule by mouth daily as needed (nutrition).  nitroGLYCERIN (NITROSTAT) 0.4 MG SL tablet Place 1 tablet (0.4 mg total) under the tongue every 5 (five) minutes as needed.   Omega-3 1000 MG CAPS Take 1,000 mg by mouth daily.   oxyCODONE (OXY IR/ROXICODONE) 5 MG immediate release tablet Take 1 tablet (5 mg total) by mouth 2 (two) times daily as needed for moderate pain or severe pain.   pravastatin (PRAVACHOL) 20 MG tablet Take 20 mg by mouth every evening.   SEMGLEE, YFGN, 100 UNIT/ML Pen Inject 14 Units into the skin in the morning.   XARELTO 20 MG TABS tablet Take 20 mg by mouth daily.     Allergies:   Patient has no known allergies.   Social History   Socioeconomic History   Marital status: Married    Spouse name: Not on file   Number of children: Not on file   Years of education: Not on file   Highest education level: Not on file  Occupational History   Not on file  Tobacco Use   Smoking status: Every Day    Packs/day: 0.50    Types: Cigarettes   Smokeless tobacco: Never  Vaping Use   Vaping Use: Never used  Substance and Sexual Activity   Alcohol use: Yes    Alcohol/week: 1.0 standard drink    Types: 1 Glasses of wine per week    Comment: Socially   Drug use: Never   Sexual activity: Not on file  Other Topics Concern   Not on file  Social  History Narrative   Not on file   Social Determinants of Health   Financial Resource Strain: Not on file  Food Insecurity: Not on file  Transportation Needs: Not on file  Physical Activity: Not on file  Stress: Not on file  Social Connections: Not on file     Family History: The patient's family history includes Angina in her father; Diabetes in her mother and paternal grandmother; Heart attack (age of onset: 75) in her father; Heart attack (age of onset: 52) in her mother. ROS:   Please see the history of present illness.    All other systems reviewed and are negative.  EKGs/Labs/Other Studies Reviewed:    The following studies were reviewed today:  EKG:  EKG from [facility] 05/02/2021 showed sinus rhythm nonspecific ST abnormality  Recent Labs: 05/02/2021: Magnesium 1.9 06/23/2021: ALT 9; BUN 11; Creat 0.80; Hemoglobin 11.9; Platelets 474; Potassium 4.3; Sodium 136  Recent Lipid Panel    Component Value Date/Time   CHOL 87 04/05/2021 1802   TRIG 121 04/05/2021 1802   HDL 19 (L) 04/05/2021 1802   CHOLHDL 4.6 04/05/2021 1802   VLDL 24 04/05/2021 1802   LDLCALC 44 04/05/2021 1802    Physical Exam:    VS:  BP (!) 152/92   Pulse 72   Ht 5\' 6"  (1.676 m)   Wt 172 lb 1.3 oz (78.1 kg)   LMP  (LMP Unknown)   SpO2 97%   BMI 27.77 kg/m     Wt Readings from Last 3 Encounters:  07/01/21 172 lb 1.3 oz (78.1 kg)  06/23/21 173 lb (78.5 kg)  06/13/21 173 lb (78.5 kg)     GEN:  Well nourished, well developed in no acute distress HEENT: Normal NECK: No JVD; No carotid bruits LYMPHATICS: No lymphadenopathy CARDIAC: 152/80 RRR, no murmurs, rubs, gallops RESPIRATORY:  Clear to auscultation without rales, wheezing or rhonchi  ABDOMEN: Soft, non-tender, non-distended MUSCULOSKELETAL:  No edema; No deformity  SKIN: Warm  and dry NEUROLOGIC:  Alert and oriented x 3 PSYCHIATRIC:  Normal affect    Signed, Norman Herrlich, MD  07/01/2021 2:16 PM    [facility]

## 2021-07-01 ENCOUNTER — Encounter: Payer: Self-pay | Admitting: Cardiology

## 2021-07-01 ENCOUNTER — Other Ambulatory Visit: Payer: Self-pay

## 2021-07-01 ENCOUNTER — Ambulatory Visit (INDEPENDENT_AMBULATORY_CARE_PROVIDER_SITE_OTHER): Payer: Medicare Other | Admitting: Cardiology

## 2021-07-01 VITALS — BP 152/80 | HR 72 | Ht 66.0 in | Wt 172.1 lb

## 2021-07-01 DIAGNOSIS — E782 Mixed hyperlipidemia: Secondary | ICD-10-CM

## 2021-07-01 DIAGNOSIS — I25118 Atherosclerotic heart disease of native coronary artery with other forms of angina pectoris: Secondary | ICD-10-CM | POA: Diagnosis not present

## 2021-07-01 DIAGNOSIS — I10 Essential (primary) hypertension: Secondary | ICD-10-CM | POA: Diagnosis not present

## 2021-07-01 DIAGNOSIS — Z7901 Long term (current) use of anticoagulants: Secondary | ICD-10-CM

## 2021-07-01 DIAGNOSIS — E119 Type 2 diabetes mellitus without complications: Secondary | ICD-10-CM

## 2021-07-01 NOTE — Patient Instructions (Signed)

## 2021-07-02 ENCOUNTER — Other Ambulatory Visit: Payer: Self-pay

## 2021-07-02 ENCOUNTER — Ambulatory Visit (INDEPENDENT_AMBULATORY_CARE_PROVIDER_SITE_OTHER): Payer: Medicare Other | Admitting: Internal Medicine

## 2021-07-02 ENCOUNTER — Encounter: Payer: Self-pay | Admitting: Internal Medicine

## 2021-07-02 ENCOUNTER — Ambulatory Visit: Payer: Medicare Other | Admitting: Infectious Diseases

## 2021-07-02 VITALS — BP 172/93 | HR 60 | Temp 97.4°F | Wt 172.0 lb

## 2021-07-02 DIAGNOSIS — A4902 Methicillin resistant Staphylococcus aureus infection, unspecified site: Secondary | ICD-10-CM

## 2021-07-02 NOTE — Patient Instructions (Signed)
You are doing well  Continue doxycyline    See dr Daiva Eves in 6-8 weeks

## 2021-07-02 NOTE — Progress Notes (Signed)
Cc: follow up MRSA endocarditis/septicemia      Patient ID: Katie Serrano, female    DOB: Jan 29, 1956, 65 y.o.   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 Veda Canning.  Is on course to complete vancomycin on September 14.  Her back pain is improved dramatically since she was in the hospital she still needs opiates to control the pain at times but less so than she did when she was hospitalized.  Knee pain is improved as well as has range of motion.  She is continue on vancomycin currently 1 g twice daily.  Most recent labs show her vancomycin trough at 14.3.  Her sedimentation rate is encouraging having gone down from a value of 93 in the hospital to 21 on most recent labs performed 3 days ago   Is an area on her left thigh that she and her husband asked me to look at her last visit that did not appear infected.  Apparently since then she has developed areas of what sound like and look like on based on pictures bullae that then have ruptured and left her with ulcers.  She went to the ER due to several of these lesions in her left foot as well as erythema in her right leg.  She was evaluated and given a dose of dalbavancin and referred back to our clinic for follow-up.  Since then she has not been on other antibiotics besides the doxycycline.  I wonder if these bullous lesions have more to do with her edema and the need to better control that with more aggressive diuresis.  Knee pain is stable her back pain is also  relatively stable it does bother her sometimes when she puts more stress on it.  ------------ 10/26 id clinic f/u Patient remains on doxycycline in the setting of cervical hardware (wasn't directly involved clinically) She had had several weeks (6) of vancomcin, and started doxy 9/13 She had blisters of the left thigh/foot while on vancomycin toward the end ?linear iga dermatitis from vanc, more likely than staph aureus bullous impetigo Doing well on doxy No n/v/diarrhea Recent right eye spontaneous retinal detachment fixed 10/5th Right knee doing well no symptoms. 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   Elevated C-reactive protein (CRP) 01/15/2021   Encounter for long-term current use of high risk medication 01/29/2016   Essential hypertension 01/29/2016   Facet arthritis of lumbar region 04/25/2021   Foot ulcer (HCC) 06/23/2021   Gastroesophageal reflux disease 01/29/2016   Intermittent asthma without complication 01/13/2021   Lower extremity edema 06/23/2021   Mixed hyperlipidemia 05/01/2019   Osteoarthritis of multiple joints 01/29/2016   Tobacco use disorder 12/02/2017   Type 2 diabetes mellitus without complication, without long-term current use of insulin (HCC)  01/30/2016   Last Assessment & Plan:  Formatting of this note might be different from the original. Relevant Hx: Course: Daily Update: Today's Plan:discussed with her that her sugars will increase and she has actively worked on her diet and diet and lost from 242 to 188 which I commend her on, she is to continue with her weight loss efforts despite taking this   Electronically signed by: Sandi Mealy    Past Surgical History:   Procedure Laterality Date   APPENDECTOMY     BUBBLE STUDY  04/04/2021   Procedure: BUBBLE STUDY;  Surgeon: Sande Rives, MD;  Location: [facility] ENDOSCOPY;  Service: Cardiovascular;;   BUNIONECTOMY Right    I & D EXTREMITY Right 04/05/2021   Procedure: IRRIGATION AND DEBRIDEMENT KNEE;  Surgeon: Samson Frederic, MD;  Location: [facility] OR;  Service: Orthopedics;  Laterality: Right;   LEFT HEART CATH AND CORONARY ANGIOGRAPHY N/A 03/12/2021   Procedure: LEFT HEART CATH AND CORONARY ANGIOGRAPHY;  Surgeon: Lennette Bihari, MD;  Location: [facility] INVASIVE CV LAB;  Service: Cardiovascular;  Laterality: N/A;   NECK SURGERY     SMALL INTESTINE SURGERY     TEE WITHOUT CARDIOVERSION N/A 04/04/2021   Procedure: TRANSESOPHAGEAL ECHOCARDITRRROGRAM (TEE);  Surgeon: Sande Rives, MD;  Location: [facility] ENDOSCOPY;  Service: Cardiovascular;  Laterality: N/A;   TONSILLECTOMY      Family History  Problem Relation Age of Onset   Diabetes Mother    Heart attack Mother 65   Angina Father    Heart attack Father 70   Diabetes Paternal Grandmother       Social History   Socioeconomic History   Marital status: Married    Spouse name: Not on file   Number of children: Not on file   Years of education: Not on file   Highest education level: Not on file  Occupational History   Not on file  Tobacco Use   Smoking status: Every Day    Packs/day: 0.50    Types: Cigarettes   Smokeless tobacco: Never  Vaping Use   Vaping Use: Never used  Substance and Sexual Activity   Alcohol use: Yes    Alcohol/week: 1.0 standard drink    Types: 1 Glasses of wine per week    Comment: Socially   Drug use: Never   Sexual activity: Not on file  Other Topics Concern   Not on file  Social History Narrative   Not on file   Social Determinants of Health   Financial Resource Strain: Not on file  Food Insecurity: Not on file  Transportation Needs: Not on file  Physical Activity: Not on file  Stress: Not on file  Social  Connections: Not on file    No Known Allergies   Current Outpatient Medications:    albuterol (VENTOLIN HFA) 108 (90 Base) MCG/ACT inhaler, Inhale 2 puffs into the lungs every 6 (six) hours as needed for wheezing or shortness of breath., Disp: , Rfl:    Cinnamon 500 MG capsule, Take 500 mg by mouth 2 (two) times a week., Disp: , Rfl:    doxycycline (VIBRAMYCIN) 100 MG capsule, Take 100 mg by mouth in the morning and at bedtime., Disp: , Rfl:    fluticasone (FLONASE) 50 MCG/ACT nasal spray, Place 2 sprays into both nostrils daily. (Patient taking differently: Place 2 sprays into both nostrils daily as needed for allergies.), Disp: 16 g, Rfl: 0   hydrochlorothiazide (MICROZIDE) 12.5 MG capsule, Take 12.5 mg by mouth 2 (two)  times daily., Disp: , Rfl:    Insulin Pen Needle 32G X 4 MM MISC, Use as directed 4 (four) times daily., Disp: 200 each, Rfl: 0   losartan (COZAAR) 50 MG tablet, Take 1 tablet (50 mg total) by mouth at bedtime. (Patient taking differently: Take 50 mg by mouth daily.), Disp: 30 tablet, Rfl: 0   metFORMIN (GLUCOPHAGE-XR) 500 MG 24 hr tablet, Take 500 mg by mouth in the morning and at bedtime., Disp: , Rfl:    metoprolol tartrate (LOPRESSOR) 25 MG tablet, Take 25 mg by mouth 2 (two) times daily., Disp: , Rfl:    morphine ([name]) 15 MG 12 hr tablet, Take 1 tablet (15 mg total) by mouth every 12 (twelve) hours., Disp: 60 tablet, Rfl: 0   Multiple Vitamin (MULTIVITAMIN) capsule, Take 1 capsule by mouth daily as needed (nutrition)., Disp: , Rfl:    Omega-3 1000 MG CAPS, Take 1,000 mg by mouth daily., Disp: , Rfl:    oxyCODONE (OXY IR/ROXICODONE) 5 MG immediate release tablet, Take 1 tablet (5 mg total) by mouth 2 (two) times daily as needed for moderate pain or severe pain., Disp: 20 tablet, Rfl: 0   pravastatin (PRAVACHOL) 20 MG tablet, Take 20 mg by mouth every evening., Disp: , Rfl:    SEMGLEE, YFGN, 100 UNIT/ML Pen, Inject 14 Units into the skin in the morning., Disp: , Rfl:     XARELTO 20 MG TABS tablet, Take 20 mg by mouth daily., Disp: , Rfl:    nitroGLYCERIN (NITROSTAT) 0.4 MG SL tablet, Place 1 tablet (0.4 mg total) under the tongue every 5 (five) minutes as needed., Disp: 25 tablet, Rfl: 3   Review of Systems     All other ros negative  Objective:   Physical Exam  General/constitutional: no distress, pleasant HEENT: Normocephalic, PER, Conj Clear, EOMI, Oropharynx clear Neck supple CV: rrr no mrg Lungs: clear to auscultation, normal respiratory effort Abd: Soft, Nontender Ext: no edema Skin: see left foot picture below Neuro: nonfocal MSK: no peripheral joint swelling/tenderness/warmth; back spines nontender; right knee incision healed; no knee swelling/redness/tenderness   Central line presence: none  Right knee 04/25/2021:    Right knee 06/23/2021:    Right leg 06/23/2021:    Area over toes where she says she had also some bullae June 23, 2021:     Left foot with ulcerations pictured below June 23, 2021:     10/24 picture of left foot        Labs: Lab Results  Component Value Date   WBC 8.5 06/23/2021   HGB 11.9 06/23/2021   HCT 39.9 06/23/2021   MCV 82.3 06/23/2021   PLT 474 (H) 06/23/2021   Crp: 10/17   8 (<8) 08/08   7.7 (<1)  Assessment & Plan:   Ulcerations of her left foot which appear consistent with a ruptured bullae:  She may have had infection of this with streptococcal species that would not be covered by her doxycycline but I also wonder if the underlying issue is not infection but rather dealing with edema and her developing bullous lesions in the context of lower extremity edema.  I have scheduled her to see my partner Dr. Elinor Parkinson next week to ensure these areas of ulceration are not showing evidence of progressive infection.  Right lower extremity "cellulitis" not overly concerned by its current appearance.  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  We had to continue doxycycline since then.  Knee pain is improved as has back pain which is stable  We will recheck sed rate CRP.  Continue doxycycline for now and reschedule with me after she sees Dr. Elinor Parkinson   ------------------- 10/26 assessment Disseminated mrsa septicemia treated Left foot bullous dermatitis sounds like vanc side effect which is resolving Has cervical spine hardware but previously no direct involvement. On doxycycline 3-6 months planned per dr Daiva Eves.  Labs reviewed from 10/17 improving inflammation  Clinically no worsening site of focal infection of concern due to mrsa  Recent retinal detachement right eye fixed 10/05  -continue doxycycline -follow up 6-8 weeks with dr Daiva Eves; labs can be done same day that visit

## 2021-07-21 DIAGNOSIS — Z86718 Personal history of other venous thrombosis and embolism: Secondary | ICD-10-CM | POA: Insufficient documentation

## 2021-07-23 ENCOUNTER — Encounter: Payer: Medicare Other | Admitting: Physical Medicine & Rehabilitation

## 2021-08-11 ENCOUNTER — Ambulatory Visit: Payer: Medicare Other | Admitting: Cardiology

## 2021-08-13 ENCOUNTER — Other Ambulatory Visit: Payer: Self-pay

## 2021-08-13 ENCOUNTER — Ambulatory Visit (INDEPENDENT_AMBULATORY_CARE_PROVIDER_SITE_OTHER): Payer: Medicare Other | Admitting: Infectious Disease

## 2021-08-13 VITALS — BP 142/87 | HR 70 | Resp 16 | Ht 66.0 in | Wt 170.0 lb

## 2021-08-13 DIAGNOSIS — M00061 Staphylococcal arthritis, right knee: Secondary | ICD-10-CM | POA: Diagnosis not present

## 2021-08-13 DIAGNOSIS — Z6830 Body mass index (BMI) 30.0-30.9, adult: Secondary | ICD-10-CM | POA: Diagnosis not present

## 2021-08-13 DIAGNOSIS — R7881 Bacteremia: Secondary | ICD-10-CM

## 2021-08-13 DIAGNOSIS — M6009 Infective myositis, multiple sites: Secondary | ICD-10-CM

## 2021-08-13 DIAGNOSIS — B9562 Methicillin resistant Staphylococcus aureus infection as the cause of diseases classified elsewhere: Secondary | ICD-10-CM | POA: Diagnosis not present

## 2021-08-13 DIAGNOSIS — E6609 Other obesity due to excess calories: Secondary | ICD-10-CM

## 2021-08-13 DIAGNOSIS — L139 Bullous disorder, unspecified: Secondary | ICD-10-CM | POA: Diagnosis not present

## 2021-08-13 DIAGNOSIS — I330 Acute and subacute infective endocarditis: Secondary | ICD-10-CM | POA: Diagnosis present

## 2021-08-13 DIAGNOSIS — I6310 Cerebral infarction due to embolism of unspecified precerebral artery: Secondary | ICD-10-CM

## 2021-08-13 DIAGNOSIS — I82621 Acute embolism and thrombosis of deep veins of right upper extremity: Secondary | ICD-10-CM | POA: Diagnosis not present

## 2021-08-13 NOTE — Progress Notes (Signed)
Subjective:  . 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 Delfino Lovett.  Is on course to complete vancomycin on September 14.  Her back pain is improved dramatically since she was in the hospital she still needs opiates to control the pain at times but less so than she did when she was hospitalized.  Knee pain is improved as well as has range of motion.  She is continued on vancomycin currently 1 g twice daily.  Most recent labs show her vancomycin trough at 14.3.  Her sedimentation rate was prior to her visit with me.  Encouraging having gone down from a value of 93 in the hospital to 21 on most recent labs performed 3 days ago  She subsequently had developed developed areas of what sound like and look like on based on pictures bullae that then have ruptured and left her with ulcers.  She went to the ER due to several of these lesions in her left foot as well as erythema in her right leg.  She was evaluated and given a dose of dalbavancin and referred back to our clinic for follow-up.  Since then she has not been on other antibiotics besides the doxycycline.  I wonder ed  these bullous lesions have more to do with her edema and the need to better control that with more aggressive diuresis.  She has come off vancomycin and switched over to doxycycline in  September.  She is on my partner Dr. Gale Journey in late October 2022.  Since coming off vancomycin she has had improvement in these ulcerative areas that are now healing up for the most part though she still has 2 open wounds.  She has notably on a blood thinner.  She has wondered if this could be an adverse reaction to the vancomycin.  The certainly possible of not seen this frequently.    Her back pain is improved dramatically and she hardly has to take anything for it only an occasional ibuprofen.  In fact she more frequently takes the ibuprofen due to the pain with her foot ulcer.       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   Elevated C-reactive protein (CRP) 01/15/2021   Encounter for long-term current use of high risk medication 01/29/2016   Essential hypertension 01/29/2016   Facet arthritis of lumbar region 04/25/2021   Foot ulcer ([place]) 06/23/2021   Gastroesophageal reflux disease 01/29/2016   Intermittent asthma without complication [id number]   Lower extremity edema 06/23/2021   Mixed hyperlipidemia 05/01/2019   Osteoarthritis of multiple joints 01/29/2016   Tobacco use disorder 12/02/2017   Type 2 diabetes  mellitus without complication, without long-term current use of insulin ([place]) 01/30/2016   Last Assessment & Plan:  Formatting of this note might be different from the original. Relevant Hx: Course: Daily Update: Today's Plan:discussed with her that her sugars will increase and she has actively worked on her diet and diet and lost from 242 to 188 which I commend her on, she is to continue with her weight loss efforts despite taking this   Electronically signed by: Danie Chandler    Past Surgical History:  Procedure Laterality  Date   APPENDECTOMY     BUBBLE STUDY  04/04/2021   Procedure: BUBBLE STUDY;  Surgeon: Geralynn Rile, MD;  Location: [place];  Service: Cardiovascular;;   BUNIONECTOMY Right    I & D EXTREMITY Right 04/05/2021   Procedure: IRRIGATION AND DEBRIDEMENT KNEE;  Surgeon: Rod Can, MD;  Location: [place];  Service: Orthopedics;  Laterality: Right;   LEFT HEART CATH AND CORONARY ANGIOGRAPHY N/A 03/12/2021   Procedure: LEFT HEART CATH AND CORONARY ANGIOGRAPHY;  Surgeon: Troy Sine, MD;  Location: [place] CV LAB;  Service: Cardiovascular;  Laterality: N/A;   NECK SURGERY     SMALL INTESTINE SURGERY     TEE WITHOUT CARDIOVERSION N/A 04/04/2021   Procedure: TRANSESOPHAGEAL ECHOCARDITRRROGRAM (TEE);  Surgeon: Geralynn Rile, MD;  Location: [facility] ENDOSCOPY;  Service: Cardiovascular;  Laterality: N/A;   TONSILLECTOMY      Family History  Problem Relation Age of Onset   Diabetes Mother    Heart attack Mother 23   Angina Father    Heart attack Father 71   Diabetes Paternal Grandmother       Social History   Socioeconomic History   Marital status: Married    Spouse name: Not on file   Number of children: Not on file   Years of education: Not on file   Highest education level: Not on file  Occupational History   Not on file  Tobacco Use   Smoking status: Every Day    Packs/day: 0.50    Types: Cigarettes   Smokeless tobacco: Never  Vaping Use   Vaping Use: Never used  Substance and Sexual Activity   Alcohol use: Yes    Alcohol/week: 1.0 standard drink    Types: 1 Glasses of wine per week    Comment: Socially   Drug use: Never   Sexual activity: Not on file  Other Topics Concern   Not on file  Social History Narrative   Not on file   Social Determinants of Health   Financial Resource Strain: Not on file  Food Insecurity: Not on file  Transportation Needs: Not on file  Physical Activity: Not on file  Stress: Not on file  Social Connections: Not on file     No Known Allergies   Current Outpatient Medications:    albuterol (VENTOLIN HFA) 108 (90 Base) MCG/ACT inhaler, Inhale 2 puffs into the lungs every 6 (six) hours as needed for wheezing or shortness of breath., Disp: , Rfl:    Cinnamon 500 MG capsule, Take 500 mg by mouth 2 (two) times a week., Disp: , Rfl:    doxycycline (VIBRAMYCIN) 100 MG capsule, Take 100 mg by mouth in the morning and at bedtime., Disp: , Rfl:    fluticasone (FLONASE) 50 MCG/ACT nasal spray, Place 2 sprays into both nostrils daily. (Patient taking differently: Place 2 sprays into both nostrils daily as needed for allergies.), Disp: 16 g, Rfl: 0   hydrochlorothiazide (MICROZIDE)  12.5 MG capsule, Take 12.5 mg by mouth 2 (two) times daily., Disp: , Rfl:    losartan (COZAAR) 50 MG tablet, Take 1 tablet (50 mg total) by mouth at bedtime. (Patient taking differently: Take 50 mg by mouth daily.), Disp: 30 tablet, Rfl: 0   metFORMIN (GLUCOPHAGE-XR) 500 MG 24 hr tablet, Take 500 mg by mouth in the morning and at bedtime., Disp: , Rfl:    metoprolol tartrate (LOPRESSOR) 25 MG tablet, Take 25 mg by mouth 2 (two) times daily., Disp: , Rfl:    Multiple Vitamin (MULTIVITAMIN) capsule, Take 1 capsule by mouth daily as needed (nutrition)., Disp: , Rfl:    nitroGLYCERIN (NITROSTAT) 0.4 MG SL tablet, Place 1 tablet (0.4 mg total) under the tongue every 5 (five) minutes as needed., Disp: 25 tablet, Rfl: 3   Omega-3 1000 MG CAPS, Take 1,000 mg by mouth daily., Disp: , Rfl:    XARELTO 20 MG TABS tablet, Take 20 mg by mouth daily., Disp: , Rfl:    Insulin Pen Needle 32G X 4 MM MISC, Use as directed 4 (four) times daily. (Patient not taking: Reported on 08/13/2021), Disp: 200 each, Rfl: 0   morphine ([name]) 15 MG 12 hr tablet, Take 1 tablet (15 mg total) by mouth every 12 (twelve) hours. (Patient not taking: Reported on 08/13/2021), Disp: 60 tablet, Rfl: 0   oxyCODONE (OXY IR/ROXICODONE) 5 MG immediate release tablet, Take 1 tablet (5 mg  total) by mouth 2 (two) times daily as needed for moderate pain or severe pain. (Patient not taking: Reported on 08/13/2021), Disp: 20 tablet, Rfl: 0   pravastatin (PRAVACHOL) 20 MG tablet, Take 20 mg by mouth every evening. (Patient not taking: Reported on 08/13/2021), Disp: , Rfl:    SEMGLEE, YFGN, 100 UNIT/ML Pen, Inject 14 Units into the skin in the morning. (Patient not taking: Reported on 08/13/2021), Disp: , Rfl:    Review of Systems  Constitutional:  Negative for activity change, appetite change, chills, diaphoresis, fatigue, fever and unexpected weight change.  HENT:  Negative for congestion, rhinorrhea, sinus pressure, sneezing, sore throat and trouble swallowing.   Eyes:  Negative for photophobia and visual disturbance.  Respiratory:  Negative for cough, chest tightness, shortness of breath, wheezing and stridor.   Cardiovascular:  Negative for chest pain, palpitations and leg swelling.  Gastrointestinal:  Negative for abdominal distention, abdominal pain, anal bleeding, blood in stool, constipation, diarrhea, nausea and vomiting.  Genitourinary:  Negative for difficulty urinating, dysuria, flank pain and hematuria.  Musculoskeletal:  Negative for arthralgias, back pain, gait problem, joint swelling and myalgias.  Skin:  Positive for rash and wound. Negative for color change and pallor.  Neurological:  Negative for dizziness, tremors, weakness and light-headedness.  Hematological:  Negative for adenopathy. Does not bruise/bleed easily.  Psychiatric/Behavioral:  Negative for agitation, behavioral problems, confusion, decreased concentration, dysphoric mood and sleep disturbance.       Objective:   Physical Exam Constitutional:      General: She is not in acute distress.    Appearance: Normal appearance. She is well-developed. She is not ill-appearing or diaphoretic.  HENT:     Head: Normocephalic and atraumatic.     Right Ear: Hearing and external ear normal.     Left Ear: Hearing  and external ear normal.     Nose: No nasal deformity or rhinorrhea.  Eyes:     General: No scleral icterus.    Conjunctiva/sclera: Conjunctivae normal.     Right eye: Right conjunctiva is  not injected.     Left eye: Left conjunctiva is not injected.     Pupils: Pupils are equal, round, and reactive to light.  Neck:     Vascular: No JVD.  Cardiovascular:     Rate and Rhythm: Normal rate and regular rhythm.     Heart sounds: S1 normal and S2 normal.  Pulmonary:     Effort: Pulmonary effort is normal. No respiratory distress.     Breath sounds: No wheezing.  Abdominal:     General: There is no distension.     Palpations: Abdomen is soft.  Musculoskeletal:        General: Normal range of motion.     Right shoulder: Normal.     Left shoulder: Normal.     Cervical back: Normal range of motion and neck supple.     Right hip: Normal.     Left hip: Normal.     Right knee: Normal.     Left knee: Normal.  Lymphadenopathy:     Head:     Right side of head: No submandibular, preauricular or posterior auricular adenopathy.     Left side of head: No submandibular, preauricular or posterior auricular adenopathy.     Cervical: No cervical adenopathy.     Right cervical: No superficial or deep cervical adenopathy.    Left cervical: No superficial or deep cervical adenopathy.  Skin:    General: Skin is warm and dry.     Coloration: Skin is not pale.     Findings: Rash present. No abrasion, bruising, ecchymosis, erythema or lesion.     Nails: There is no clubbing.  Neurological:     General: No focal deficit present.     Mental Status: She is alert and oriented to person, place, and time.     Sensory: No sensory deficit.     Coordination: Coordination normal.     Gait: Gait normal.  Psychiatric:        Attention and Perception: She is attentive.        Mood and Affect: Mood normal.        Speech: Speech normal.        Behavior: Behavior normal. Behavior is cooperative.        Thought  Content: Thought content normal.        Judgment: Judgment normal.    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  We will check a sed rate CRP BMP and CBC differential today and she will stop the doxycycline.  We will see her back in 2 months time to reassess her clinically and reassess her with labs.  She is to continue to hold onto the doxycycline she has and certainly if she experiences worsening back pain or knee pain or other evidence of recurrence of her infection she is let us know and we will have her restart antibiotics.  So if she has worsening back pain would  also get an MRI of the lumbar spine.   Bullous lesions: Given the chance this could have been an adverse reaction to vancomycin I have added it to her allergy list though it is not clear to me that that is the reason why she developed these lesions.  They are resolving.  Deep venous thrombosis on chronic anticoagulation

## 2021-08-14 ENCOUNTER — Other Ambulatory Visit: Payer: Self-pay

## 2021-08-14 DIAGNOSIS — I82621 Acute embolism and thrombosis of deep veins of right upper extremity: Secondary | ICD-10-CM

## 2021-08-14 LAB — CBC WITH DIFFERENTIAL/PLATELET
Absolute Monocytes: 905 cells/uL (ref 200–950)
Basophils Absolute: 70 cells/uL (ref 0–200)
Basophils Relative: 0.6 %
Eosinophils Absolute: 615 cells/uL — ABNORMAL HIGH (ref 15–500)
Eosinophils Relative: 5.3 %
HCT: 40.8 % (ref 35.0–45.0)
Hemoglobin: 13.1 g/dL (ref 11.7–15.5)
Lymphs Abs: 2355 cells/uL (ref 850–3900)
MCH: 26.4 pg — ABNORMAL LOW (ref 27.0–33.0)
MCHC: 32.1 g/dL (ref 32.0–36.0)
MCV: 82.1 fL (ref 80.0–100.0)
MPV: 11 fL (ref 7.5–12.5)
Monocytes Relative: 7.8 %
Neutro Abs: 7656 cells/uL (ref 1500–7800)
Neutrophils Relative %: 66 %
Platelets: 374 10*3/uL (ref 140–400)
RBC: 4.97 10*6/uL (ref 3.80–5.10)
RDW: 16.7 % — ABNORMAL HIGH (ref 11.0–15.0)
Total Lymphocyte: 20.3 %
WBC: 11.6 10*3/uL — ABNORMAL HIGH (ref 3.8–10.8)

## 2021-08-14 LAB — BASIC METABOLIC PANEL WITH GFR
BUN: 21 mg/dL (ref 7–25)
CO2: 27 mmol/L (ref 20–32)
Calcium: 10.2 mg/dL (ref 8.6–10.4)
Chloride: 105 mmol/L (ref 98–110)
Creat: 0.87 mg/dL (ref 0.50–1.05)
Glucose, Bld: 134 mg/dL — ABNORMAL HIGH (ref 65–99)
Potassium: 4.5 mmol/L (ref 3.5–5.3)
Sodium: 141 mmol/L (ref 135–146)
eGFR: 73 mL/min/{1.73_m2} (ref >=60)

## 2021-08-14 LAB — SEDIMENTATION RATE: Sed Rate: 11 mm/h (ref 0–30)

## 2021-08-14 LAB — C-REACTIVE PROTEIN: CRP: 0.9 mg/L (ref <8.0)

## 2021-08-20 ENCOUNTER — Other Ambulatory Visit: Payer: Self-pay | Admitting: Infectious Disease

## 2021-09-04 ENCOUNTER — Ambulatory Visit (INDEPENDENT_AMBULATORY_CARE_PROVIDER_SITE_OTHER): Payer: Medicare Other

## 2021-09-04 ENCOUNTER — Other Ambulatory Visit: Payer: Self-pay

## 2021-09-04 DIAGNOSIS — I82621 Acute embolism and thrombosis of deep veins of right upper extremity: Secondary | ICD-10-CM | POA: Diagnosis not present

## 2021-09-10 ENCOUNTER — Telehealth: Payer: Self-pay

## 2021-09-10 NOTE — Telephone Encounter (Signed)
-----   Message from Baldo Daub, MD sent at 09/10/2021 11:52 AM EST ----- Normal or stable result

## 2021-09-10 NOTE — Telephone Encounter (Signed)
Patient notified of results.

## 2021-10-08 ENCOUNTER — Other Ambulatory Visit: Payer: Self-pay

## 2021-10-08 ENCOUNTER — Encounter: Payer: Self-pay | Admitting: Physical Medicine & Rehabilitation

## 2021-10-08 ENCOUNTER — Encounter: Payer: Medicare Other | Attending: Physical Medicine & Rehabilitation | Admitting: Physical Medicine & Rehabilitation

## 2021-10-08 VITALS — BP 168/91 | HR 65 | Temp 97.9°F | Ht 66.0 in | Wt 177.0 lb

## 2021-10-08 DIAGNOSIS — M47816 Spondylosis without myelopathy or radiculopathy, lumbar region: Secondary | ICD-10-CM | POA: Insufficient documentation

## 2021-10-08 DIAGNOSIS — M00061 Staphylococcal arthritis, right knee: Secondary | ICD-10-CM | POA: Insufficient documentation

## 2021-10-08 NOTE — Patient Instructions (Signed)
CONTINUE WITH THE HCTZ FOR NOW TO HELP YOUR BLOOD PRESSURE. TALK WITH DR. Shary Decamp WHEN YOU SEE HIM ABOUT ADJUSTING YOUR OTHER MEDS AS YOU MIGHT BE ABLE TO DROP HCTZ.

## 2021-10-08 NOTE — Progress Notes (Signed)
Subjective:    Patient ID: Katie Serrano, female    DOB: 1956-02-23, 66 y.o.   MRN: [id number]  HPI  Mrs. Pitkin is here in follow-up after her inpatient rehab stay over the summer where she was in with paraspinal myositis lumbar facet septic arthritis and associated debility.  She was discharged home has completed therapy.  She is now ambulating unassisted and uses a straight cane for longer distances. Her back has been feeling very well. Her right knee can bother her occasionally for which she will take a tylenol or ibuprofen. She is of insulin  She stays active taking care of her grandkids along with her husband. She had a detached retina and had cataracts surgery last week.  Her sugars are well controlled.  She is now off insulin and only taking metformin alone.  She still has a slightly irritated/open area on the great toe of her left foot which she keeps a bandage over.  The area is healing.  She tends to wear sandals and loosefitting shoes after she developed blisters on the hospital on antibiotics.   Pain Inventory00 Average Pain  Pain Right Now 0 My pain is intermittent and dull  In the last 24 hours, has pain interfered with the following? General activity 0 Relation with others 0 Enjoyment of life 0 What TIME of day is your pain at its worst? night Sleep (in general) Good  Pain is worse with: some activites and over doing Pain improves with: rest Relief from Meds:  no pain medicine taken      Family History  Problem Relation Age of Onset   Diabetes Mother    Heart attack Mother 75   Angina Father    Heart attack Father 70   Diabetes Paternal Grandmother    Social History   Socioeconomic History   Marital status: Married    Spouse name: Not on file   Number of children: Not on file   Years of education: Not on file   Highest education level: Not on file  Occupational History   Not on file  Tobacco Use   Smoking status: Every Day    Packs/day: 0.50     Types: Cigarettes   Smokeless tobacco: Never  Vaping Use   Vaping Use: Never used  Substance and Sexual Activity   Alcohol use: Yes    Alcohol/week: 1.0 standard drink    Types: 1 Glasses of wine per week    Comment: Socially   Drug use: Never   Sexual activity: Not on file  Other Topics Concern   Not on file  Social History Narrative   Not on file   Social Determinants of Health   Financial Resource Strain: Not on file  Food Insecurity: Not on file  Transportation Needs: Not on file  Physical Activity: Not on file  Stress: Not on file  Social Connections: Not on file   Past Surgical History:  Procedure Laterality Date   APPENDECTOMY     BUBBLE STUDY  04/04/2021   Procedure: BUBBLE STUDY;  Surgeon: Geralynn Rile, MD;  Location: [place];  Service: Cardiovascular;;   BUNIONECTOMY Right    I & D EXTREMITY Right 04/05/2021   Procedure: IRRIGATION AND DEBRIDEMENT KNEE;  Surgeon: Rod Can, MD;  Location: [place];  Service: Orthopedics;  Laterality: Right;   LEFT HEART CATH AND CORONARY ANGIOGRAPHY N/A 03/12/2021   Procedure: LEFT HEART CATH AND CORONARY ANGIOGRAPHY;  Surgeon: Troy Sine, MD;  Location: [place]  [facility];  Service: Cardiovascular;  Laterality: N/A;   NECK SURGERY     SMALL INTESTINE SURGERY     TEE WITHOUT CARDIOVERSION N/A 04/04/2021   Procedure: TRANSESOPHAGEAL ECHOCARDITRRROGRAM (TEE);  Surgeon: Geralynn Rile, MD;  Location: [place];  Service: Cardiovascular;  Laterality: N/A;   TONSILLECTOMY     Past Medical History:  Diagnosis Date   Arthritis, septic, knee ([place]) 06/23/2021   Bed sore 04/25/2021   Class 1 obesity due to excess calories with serious comorbidity and body mass index (BMI) of 30.0 to 30.9 in adult 06/12/2019   Diskitis 04/25/2021   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   Elevated C-reactive protein (CRP) 01/15/2021   Encounter for long-term current use of high risk medication 01/29/2016   Essential hypertension  01/29/2016   Facet arthritis of lumbar region 04/25/2021   Foot ulcer ([place]) 06/23/2021   Gastroesophageal reflux disease 01/29/2016   Intermittent asthma without complication 123456   Lower extremity edema 06/23/2021   Mixed hyperlipidemia 05/01/2019   Osteoarthritis of multiple joints 01/29/2016   Tobacco use disorder 12/02/2017   Type 2 diabetes mellitus without complication, without long-term current use of insulin ([place]) 01/30/2016   Last Assessment & Plan:  Formatting of this note might be different from the original. Relevant Hx: Course: Daily Update: Today's Plan:discussed with her that her sugars will increase and she has actively worked on her diet and diet and lost from 242 to 188 which I commend her on, she is to continue with her weight loss efforts despite taking this   Electronically signed by: Ruthell Rummage Brown-Pa   BP (!) 168/91    Pulse 65    Temp 97.9 F (36.6 C)    Ht 5\' 6"  (1.676 m)    Wt 177 lb (80.3 kg)    LMP  (LMP Unknown)    BMI 28.57 kg/m   Opioid Risk Score:   Fall Risk Score:  `1  Depression screen PHQ 2/9  Depression screen [facility] 2/9 10/08/2021 08/13/2021 07/02/2021 06/23/2021 05/07/2021 04/25/2021  Decreased Interest 0 0 0 0 0 0  Down, Depressed, Hopeless 0 0 0 0 0 0  PHQ - 2 Score 0 0 0 0 0 0  Altered sleeping - - - - 2 -  Tired, decreased energy - - - - 0 -  Change in appetite - - - - 0 -  Feeling bad or failure about yourself  - - - - 0 -  Trouble concentrating - - - - 0 -  Moving slowly or fidgety/restless - - - - 0 -  Suicidal thoughts - - - - 0 -  PHQ-9 Score - - - - 2 -    Review of Systems  Musculoskeletal:        Right knee pain  All other systems reviewed and are negative.     Objective:   Physical Exam Gen: no distress, normal appearing HEENT: oral mucosa pink and moist, NCAT Cardio: Reg rate Chest: normal effort, normal rate of breathing Abd: soft, non-distended Ext: no edema Psych: pleasant, normal affect Skin: intact except  for she has a Band-Aid over the inner left great toe Neuro: Alert and oriented x 3. Normal insight and awareness. Intact Memory. Normal language and speech. Cranial nerve exam unremarkable Motor is nearly 5 out of 5 in all limbs.  Normal sensation.  Lost balance slightly posteriorly with Romberg testing.  Gait was normal with tandem and heel-to-toe techniques. Musculoskeletal: Full ROM, No pain with AROM or PROM in  the neck, trunk, or extremities. Posture appropriate        Assessment & Plan:  Medical Problem List and Plan: 1.  Paraspinal myositis, lumbar facet septic arthritis MRSA bacteremia             Patient has made a nice recovery.  -Recommended using her cane for longer distances or uneven surfaces  -Once her left toe completely heals, recommended using shoes with better support when she is ambulating outside of her home 2.  . Pain Management: Still has some pain at the right knee which is really mild.  -Continue ibuprofen or Tylenol as needed 3.  Hypertension: Continue Lopressor and losartan.  Recommended resuming HCTZ as her blood pressure was elevated today.  She might want to discuss with her primary care physician about increasing her losartan potentially so that she can drop the HCTZ dose to.  15 minutes was spent in the office today reviewing patient's records and, in discussion and in discussion of treatment plan.  I will see her back as needed

## 2021-10-20 NOTE — Progress Notes (Signed)
Subjective:  . Chief complaint follow-up for disseminated MSSA infection   Patient ID: Katie Serrano, female    DOB: [birth year], 66 y.o.   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 Delfino Lovett.  Is on course to complete vancomycin on September 14.  Her back pain is improved dramatically since she was in the hospital she still needs opiates to control the pain at times but less so than she did when she was hospitalized.  Knee pain is improved as well as has range of motion.  She is continued on vancomycin currently 1 g twice daily.  Most recent labs show her vancomycin trough at 14.3.  Her sedimentation rate was prior to her visit with me.  Encouraging having gone down from a value of 93 in the hospital to 21 on most recent labs performed 3 days ago  She subsequently had developed developed areas of what sound like and look like on based on pictures bullae that then have ruptured and left her with ulcers.  She went to the ER due to several of these lesions in her left foot as well as erythema in her right leg.  She was evaluated and given a dose of dalbavancin and referred back to our clinic for follow-up.  Since then she has not been on other antibiotics besides the doxycycline.  I wonder ed  these bullous lesions have more to do with her edema and the need to better control that with more aggressive diuresis.  She has come off vancomycin and switched over to doxycycline in September.  She is on my partner Dr. Gale Serrano in  late October 2022.  Since coming off vancomycin she has had improvement in these ulcerative areas that are now healing up for the most part though she still has 2 open wounds.  She has notably on a blood thinner.  She has wondered if this could be an adverse reaction to the vancomycin.  The certainly possible of not seen this frequently.    Her back pain is improved dramatically and she hardly has to take anything for it only an occasional ibuprofen.  In fact she more frequently takes the ibuprofen due to the pain with her foot ulcer.    Patient has now been off antibiotics for nearly 4 months.  She has had no evidence of recurrent infection at any of the sites where she had it.  She does not have back pain or knee pain is resolved and she has been discharged from orthopedic surgery.     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   Elevated C-reactive protein (CRP) 01/15/2021   Encounter for long-term current use of high risk medication 01/29/2016   Essential hypertension 01/29/2016   Facet arthritis of lumbar region 04/25/2021   Foot ulcer ([place]) 06/23/2021  Gastroesophageal reflux disease 01/29/2016   Intermittent asthma without complication [id number]   Lower extremity edema 06/23/2021   Mixed hyperlipidemia 05/01/2019   Osteoarthritis of multiple joints 01/29/2016   Tobacco use disorder 12/02/2017   Type 2 diabetes mellitus without complication, without long-term current use of insulin ([place]) 01/30/2016   Last Assessment & Plan:  Formatting of this note might be different from the original. Relevant Hx: Course: Daily Update: Today's Plan:discussed with her that her sugars will increase and she has actively worked on her diet and diet and lost  from 242 to 188 which I commend her on, she is to continue with her weight loss efforts despite taking this   Electronically signed by: Danie Chandler    Past Surgical History:  Procedure Laterality Date   APPENDECTOMY     BUBBLE STUDY  04/04/2021   Procedure: BUBBLE STUDY;  Surgeon: Geralynn Rile, MD;  Location: [place];  Service: Cardiovascular;;   BUNIONECTOMY Right    I & D EXTREMITY Right 04/05/2021   Procedure: IRRIGATION AND DEBRIDEMENT KNEE;  Surgeon: Rod Can, MD;  Location: [place];  Service: Orthopedics;  Laterality: Right;   LEFT HEART CATH AND CORONARY ANGIOGRAPHY N/A 03/12/2021   Procedure: LEFT HEART CATH AND CORONARY ANGIOGRAPHY;  Surgeon: Troy Sine, MD;  Location: [place] [facility];  Service: Cardiovascular;  Laterality: N/A;   NECK SURGERY     SMALL INTESTINE SURGERY     TEE WITHOUT CARDIOVERSION N/A 04/04/2021   Procedure: TRANSESOPHAGEAL ECHOCARDITRRROGRAM (TEE);  Surgeon: Geralynn Rile, MD;  Location: [facility];  Service: Cardiovascular;  Laterality: N/A;   TONSILLECTOMY      Family History  Problem Relation Age of Onset   Diabetes Mother    Heart attack Mother 38   Angina Father    Heart attack Father 14   Diabetes Paternal Grandmother       Social History   Socioeconomic History   Marital status: Married    Spouse name: Not on file   Number of children: Not on file   Years of education: Not on file   Highest education level: Not on file  Occupational History   Not on file  Tobacco Use   Smoking status: Every Day    Packs/day: 0.50    Types: Cigarettes   Smokeless tobacco: Never  Vaping Use   Vaping Use: Never used  Substance and Sexual Activity   Alcohol use: Yes    Alcohol/week: 1.0 standard drink    Types: 1 Glasses of wine per week    Comment: Socially   Drug use: Never   Sexual activity: Not on file  Other Topics Concern   Not on file  Social History Narrative   Not on file   Social  Determinants of Health   Financial Resource Strain: Not on file  Food Insecurity: Not on file  Transportation Needs: Not on file  Physical Activity: Not on file  Stress: Not on file  Social Connections: Not on file    Allergies  Allergen Reactions   Vancomycin Other (See Comments)    Had bullous lesions develop on her feet while she was on vancomycin  Not clear if it was due to this antibiotic     Current Outpatient Medications:    albuterol (VENTOLIN HFA) 108 (90 Base) MCG/ACT inhaler, Inhale 2 puffs into the lungs every 6 (six) hours as needed for wheezing or shortness of breath., Disp: , Rfl:    Cinnamon 500 MG capsule,  Take 500 mg by mouth 2 (two) times a week., Disp: , Rfl:    hydrochlorothiazide (MICROZIDE) 12.5 MG capsule, Take 12.5 mg by mouth 2 (two) times daily., Disp: , Rfl:    ibuprofen (ADVIL) 800 MG tablet, Take 800 mg by mouth every 6 (six) hours as needed., Disp: , Rfl:    Insulin Pen Needle 32G X 4 MM MISC, Use as directed 4 (four) times daily. (Patient not taking: Reported on 08/13/2021), Disp: 200 each, Rfl: 0   losartan (COZAAR) 50 MG tablet, Take 1 tablet (50 mg total) by mouth at bedtime. (Patient taking differently: Take 50 mg by mouth daily.), Disp: 30 tablet, Rfl: 0   metFORMIN (GLUCOPHAGE-XR) 500 MG 24 hr tablet, Take 500 mg by mouth in the morning and at bedtime., Disp: , Rfl:    metoprolol tartrate (LOPRESSOR) 25 MG tablet, Take 25 mg by mouth 2 (two) times daily., Disp: , Rfl:    Multiple Vitamin (MULTIVITAMIN) capsule, Take 1 capsule by mouth daily as needed (nutrition)., Disp: , Rfl:    nitroGLYCERIN (NITROSTAT) 0.4 MG SL tablet, Place 1 tablet (0.4 mg total) under the tongue every 5 (five) minutes as needed., Disp: 25 tablet, Rfl: 3   Omega-3 1000 MG CAPS, Take 1,000 mg by mouth daily., Disp: , Rfl:    pravastatin (PRAVACHOL) 20 MG tablet, Take 20 mg by mouth every evening., Disp: , Rfl:    Review of Systems  Constitutional:  Negative for activity  change, appetite change, chills, diaphoresis, fatigue, fever and unexpected weight change.  HENT:  Negative for congestion, rhinorrhea, sinus pressure, sneezing, sore throat and trouble swallowing.   Eyes:  Negative for photophobia and visual disturbance.  Respiratory:  Negative for cough, chest tightness, shortness of breath, wheezing and stridor.   Cardiovascular:  Negative for chest pain, palpitations and leg swelling.  Gastrointestinal:  Negative for abdominal distention, abdominal pain, anal bleeding, blood in stool, constipation, diarrhea, nausea and vomiting.  Genitourinary:  Negative for difficulty urinating, dysuria, flank pain and hematuria.  Musculoskeletal:  Negative for arthralgias, back pain, gait problem, joint swelling and myalgias.  Skin:  Negative for color change, pallor, rash and wound.  Neurological:  Negative for dizziness, tremors, weakness and light-headedness.  Hematological:  Negative for adenopathy. Does not bruise/bleed easily.  Psychiatric/Behavioral:  Negative for agitation, behavioral problems, confusion, decreased concentration, dysphoric mood and sleep disturbance.       Objective:   Physical Exam Constitutional:      General: She is not in acute distress.    Appearance: Normal appearance. She is well-developed. She is not ill-appearing or diaphoretic.  HENT:     Head: Normocephalic and atraumatic.     Right Ear: Hearing and external ear normal.     Left Ear: Hearing and external ear normal.     Nose: No nasal deformity or rhinorrhea.  Eyes:     General: No scleral icterus.    Conjunctiva/sclera: Conjunctivae normal.     Right eye: Right conjunctiva is not injected.     Left eye: Left conjunctiva is not injected.     Pupils: Pupils are equal, round, and reactive to light.  Neck:     Vascular: No JVD.  Cardiovascular:     Rate and Rhythm: Normal rate and regular rhythm.     Heart sounds: S1 normal and S2 normal.  Pulmonary:     Effort: Pulmonary  effort is normal. No respiratory distress.     Breath sounds: No wheezing.  Abdominal:  General: There is no distension.     Palpations: Abdomen is soft.  Musculoskeletal:        General: Normal range of motion.     Right shoulder: Normal.     Left shoulder: Normal.     Cervical back: Normal range of motion and neck supple.     Right hip: Normal.     Left hip: Normal.     Right knee: Normal.     Left knee: Normal.  Lymphadenopathy:     Head:     Right side of head: No submandibular, preauricular or posterior auricular adenopathy.     Left side of head: No submandibular, preauricular or posterior auricular adenopathy.     Cervical: No cervical adenopathy.     Right cervical: No superficial or deep cervical adenopathy.    Left cervical: No superficial or deep cervical adenopathy.  Skin:    General: Skin is warm and dry.     Coloration: Skin is not pale.     Findings: No abrasion, bruising, ecchymosis, erythema, lesion or rash.     Nails: There is no clubbing.  Neurological:     Mental Status: She is alert and oriented to person, place, and time.     Sensory: No sensory deficit.     Coordination: Coordination normal.     Gait: Gait normal.  Psychiatric:        Attention and Perception: She is attentive.        Mood and Affect: Mood normal.        Speech: Speech normal.        Behavior: Behavior normal. Behavior is cooperative.        Thought Content: Thought content normal.        Judgment: Judgment normal.    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  We will recheck sed rate CRP CBC with differential and CMP.   She is to continue to hold onto the doxycycline she has and certainly if she experiences worsening back pain or knee pain or other evidence of recurrence of her infection she is let us know and we will have her restart antibiotics.  So if she has worsening back pain would also get an MRI of the lumbar spine.   Bullous lesions: May have indeed been due to allergic reaction they resolved   Vaccine counseling: Recommended updated COVID-19 booster flu shot and Prevnar 20 and she took COVID-19 booster and Prevnar 20.

## 2021-10-21 ENCOUNTER — Ambulatory Visit (INDEPENDENT_AMBULATORY_CARE_PROVIDER_SITE_OTHER): Payer: Medicare Other

## 2021-10-21 ENCOUNTER — Encounter: Payer: Self-pay | Admitting: Infectious Disease

## 2021-10-21 ENCOUNTER — Ambulatory Visit (INDEPENDENT_AMBULATORY_CARE_PROVIDER_SITE_OTHER): Payer: Medicare Other | Admitting: Infectious Disease

## 2021-10-21 ENCOUNTER — Other Ambulatory Visit: Payer: Self-pay

## 2021-10-21 VITALS — BP 141/86 | HR 63 | Temp 97.9°F | Resp 16 | Ht 66.0 in | Wt 174.0 lb

## 2021-10-21 DIAGNOSIS — I63112 Cerebral infarction due to embolism of left vertebral artery: Secondary | ICD-10-CM | POA: Diagnosis not present

## 2021-10-21 DIAGNOSIS — B9562 Methicillin resistant Staphylococcus aureus infection as the cause of diseases classified elsewhere: Secondary | ICD-10-CM

## 2021-10-21 DIAGNOSIS — I330 Acute and subacute infective endocarditis: Secondary | ICD-10-CM | POA: Diagnosis present

## 2021-10-21 DIAGNOSIS — R7881 Bacteremia: Secondary | ICD-10-CM

## 2021-10-21 DIAGNOSIS — M4644 Discitis, unspecified, thoracic region: Secondary | ICD-10-CM | POA: Diagnosis not present

## 2021-10-21 DIAGNOSIS — E1165 Type 2 diabetes mellitus with hyperglycemia: Secondary | ICD-10-CM

## 2021-10-21 DIAGNOSIS — I76 Septic arterial embolism: Secondary | ICD-10-CM | POA: Diagnosis not present

## 2021-10-21 DIAGNOSIS — I10 Essential (primary) hypertension: Secondary | ICD-10-CM | POA: Diagnosis not present

## 2021-10-21 DIAGNOSIS — Z23 Encounter for immunization: Secondary | ICD-10-CM

## 2021-10-21 DIAGNOSIS — L97503 Non-pressure chronic ulcer of other part of unspecified foot with necrosis of muscle: Secondary | ICD-10-CM | POA: Diagnosis not present

## 2021-10-21 DIAGNOSIS — M00062 Staphylococcal arthritis, left knee: Secondary | ICD-10-CM | POA: Diagnosis not present

## 2021-10-21 DIAGNOSIS — Z794 Long term (current) use of insulin: Secondary | ICD-10-CM

## 2021-10-21 DIAGNOSIS — R29898 Other symptoms and signs involving the musculoskeletal system: Secondary | ICD-10-CM | POA: Diagnosis not present

## 2021-10-21 NOTE — Progress Notes (Signed)
° °  Covid-19 Vaccination Clinic  Name:  Katie Serrano    MRN: [id number] DOB: 10-28-1955  10/21/2021  [name] was observed post Covid-19 immunization for 15 minutes without incident. She was provided with Vaccine Information Sheet and instruction to access the V-Safe system.   [name] was instructed to call 911 with any severe reactions post vaccine: Difficulty breathing  Swelling of face and throat  A fast heartbeat  A bad rash all over body  Dizziness and weakness   Immunizations Administered     Name Date Dose VIS Date Route   Pfizer Covid-19 Vaccine Bivalent Booster 10/21/2021 11:49 AM 0.3 mL 05/07/2021 Intramuscular   Manufacturer: [employer], [employer]   Lot: [id number]   NDC: [phone]        Admir Candelas Lesli Albee, CMA

## 2021-10-22 LAB — COMPLETE METABOLIC PANEL WITH GFR
AG Ratio: 1.4 (calc) (ref 1.0–2.5)
ALT: 18 U/L (ref 6–29)
AST: 15 U/L (ref 10–35)
Albumin: 4.1 g/dL (ref 3.6–5.1)
Alkaline phosphatase (APISO): 104 U/L (ref 37–153)
BUN/Creatinine Ratio: 21 (calc) (ref 6–22)
BUN: 23 mg/dL (ref 7–25)
CO2: 29 mmol/L (ref 20–32)
Calcium: 10.2 mg/dL (ref 8.6–10.4)
Chloride: 98 mmol/L (ref 98–110)
Creat: 1.11 mg/dL — ABNORMAL HIGH (ref 0.50–1.05)
Globulin: 3 g/dL (calc) (ref 1.9–3.7)
Glucose, Bld: 295 mg/dL — ABNORMAL HIGH (ref 65–99)
Potassium: 4.9 mmol/L (ref 3.5–5.3)
Sodium: 136 mmol/L (ref 135–146)
Total Bilirubin: 0.4 mg/dL (ref 0.2–1.2)
Total Protein: 7.1 g/dL (ref 6.1–8.1)
eGFR: 55 mL/min/{1.73_m2} — ABNORMAL LOW (ref >=60)

## 2021-10-22 LAB — CBC WITH DIFFERENTIAL/PLATELET
Absolute Monocytes: 791 cells/uL (ref 200–950)
Basophils Absolute: 74 cells/uL (ref 0–200)
Basophils Relative: 0.8 %
Eosinophils Absolute: 966 cells/uL — ABNORMAL HIGH (ref 15–500)
Eosinophils Relative: 10.5 %
HCT: 43.4 % (ref 35.0–45.0)
Hemoglobin: 14.1 g/dL (ref 11.7–15.5)
Lymphs Abs: 2070 cells/uL (ref 850–3900)
MCH: 28.4 pg (ref 27.0–33.0)
MCHC: 32.5 g/dL (ref 32.0–36.0)
MCV: 87.5 fL (ref 80.0–100.0)
MPV: 11.6 fL (ref 7.5–12.5)
Monocytes Relative: 8.6 %
Neutro Abs: 5299 cells/uL (ref 1500–7800)
Neutrophils Relative %: 57.6 %
Platelets: 340 10*3/uL (ref 140–400)
RBC: 4.96 10*6/uL (ref 3.80–5.10)
RDW: 13.9 % (ref 11.0–15.0)
Total Lymphocyte: 22.5 %
WBC: 9.2 10*3/uL (ref 3.8–10.8)

## 2021-10-22 LAB — C-REACTIVE PROTEIN: CRP: 2.1 mg/L (ref <8.0)

## 2021-10-22 LAB — SEDIMENTATION RATE: Sed Rate: 6 mm/h (ref 0–30)

## 2022-01-29 ENCOUNTER — Other Ambulatory Visit: Payer: Self-pay | Admitting: Cardiology

## 2022-09-10 NOTE — Progress Notes (Signed)
Cardiology Office Note:    Date:  09/11/2022   ID:  Katie Serrano, DOB 01/13/1956, MRN [id number]  PCP:  Gordan Payment., MD  Cardiologist:  Norman Herrlich, MD    Referring MD: Gordan Payment., MD    ASSESSMENT:    1. Coronary artery disease of native artery of native heart with stable angina pectoris (HCC)   2. Type 2 diabetes mellitus without complication, without long-term current use of insulin (HCC)   3. Essential hypertension   4. Mixed hyperlipidemia    PLAN:    In order of problems listed above:  She has done well with medical therapy for CAD is having no angina normal exercise tolerance will continue current treatment including aspirin beta-blocker statin and over-the-counter fish oil.  I encouraged her to get into regular activity program using a smart watch 6000 steps a day. Stable diabetes continue current treatment managed by her PCP Blood pressure at target continue treatment thiazide diuretic beta-blocker blood pressure is at target Continue her statin and I will check a lipid profile and CMP today   Next appointment: 9 months   Medication Adjustments/Labs and Tests Ordered: Current medicines are reviewed at length with the patient today.  Concerns regarding medicines are outlined above.  Orders Placed This Encounter  Procedures   Comprehensive metabolic panel   Lipid panel   EKG 12-Lead   No orders of the defined types were placed in this encounter.   Chief complaint follow-up for CAD   History of Present Illness:    Katie Serrano is a 67 y.o. female with a hx of coronary artery disease hypertension hyperlipidemia chronic anticoagulation and type 2 diabetes last seen 07/01/2021.  Cardiac CTA showed multivessel CAD I advised coronary angiography she chose ongoing medical therapy.  She underwent coronary angiography showing total occlusion of the proximal right coronary artery and treated medically.  She had a TEE performed 04/04/2021 for MRSA  bacteremia with CNS embolism showing no findings of vegetation or infective endocarditis and she was anticoagulated for upper extremity thrombophlebitis.  Compliance with diet, lifestyle and medications: Yes  Since last seen she is doing well she tolerates her cardiac medications without side effect no muscle pain or weakness from her statin she does indeed take aspirin daily although not listed on her medication and has had no cardiovascular symptoms of edema shortness of breath orthopnea chest pain palpitation or syncope Past Medical History:  Diagnosis Date   Arthritis, septic, knee (HCC) 06/23/2021   Bed sore 04/25/2021   Class 1 obesity due to excess calories with serious comorbidity and body mass index (BMI) of 30.0 to 30.9 in adult 06/12/2019   Diskitis 04/25/2021   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   Elevated C-reactive protein (CRP) 01/15/2021   Encounter for long-term current use of high risk medication 01/29/2016   Essential hypertension 01/29/2016   Facet arthritis of lumbar region 04/25/2021   Foot ulcer (HCC) 06/23/2021   Gastroesophageal reflux disease 01/29/2016   Intermittent asthma without complication 01/13/2021   Lower extremity edema 06/23/2021   Mixed hyperlipidemia 05/01/2019   Osteoarthritis of multiple joints 01/29/2016   Tobacco use disorder 12/02/2017   Type 2 diabetes mellitus without complication, without long-term current use of insulin (HCC) 01/30/2016   Last Assessment & Plan:  Formatting of this note might be different from the original. Relevant Hx: Course: Daily Update: Today's Plan:discussed with her that her sugars will increase and she has actively worked on her diet and diet and lost from  242 to 188 which I commend her on, she is to continue with her weight loss efforts despite taking this   Electronically signed by: Danie Chandler    Past Surgical History:  Procedure Laterality Date   APPENDECTOMY     BUBBLE STUDY  04/04/2021   Procedure: BUBBLE  STUDY;  Surgeon: Geralynn Rile, MD;  Location: [place];  Service: Cardiovascular;;   BUNIONECTOMY Right    I & D EXTREMITY Right 04/05/2021   Procedure: IRRIGATION AND DEBRIDEMENT KNEE;  Surgeon: Rod Can, MD;  Location: [place];  Service: Orthopedics;  Laterality: Right;   LEFT HEART CATH AND CORONARY ANGIOGRAPHY N/A 03/12/2021   Procedure: LEFT HEART CATH AND CORONARY ANGIOGRAPHY;  Surgeon: Troy Sine, MD;  Location: [place] [facility];  Service: Cardiovascular;  Laterality: N/A;   NECK SURGERY     SMALL INTESTINE SURGERY     TEE WITHOUT CARDIOVERSION N/A 04/04/2021   Procedure: TRANSESOPHAGEAL ECHOCARDITRRROGRAM (TEE);  Surgeon: Geralynn Rile, MD;  Location: [place];  Service: Cardiovascular;  Laterality: N/A;   TONSILLECTOMY      Current Medications: Current Meds  Medication Sig   albuterol (VENTOLIN HFA) 108 (90 Base) MCG/ACT inhaler Inhale 2 puffs into the lungs every 6 (six) hours as needed for wheezing or shortness of breath.   aspirin EC 81 MG tablet Take 81 mg by mouth daily. Swallow whole.   Cinnamon 500 MG capsule Take 500 mg by mouth 2 (two) times a week.   hydrochlorothiazide (MICROZIDE) 12.5 MG capsule Take 12.5 mg by mouth 2 (two) times daily.   ibuprofen (ADVIL) 800 MG tablet Take 800 mg by mouth every 6 (six) hours as needed.   losartan (COZAAR) 50 MG tablet Take 1 tablet (50 mg total) by mouth at bedtime. (Patient taking differently: Take 50 mg by mouth daily.)   metFORMIN (GLUCOPHAGE-XR) 500 MG 24 hr tablet Take 500 mg by mouth in the morning and at bedtime.   metoprolol tartrate (LOPRESSOR) 25 MG tablet TAKE 1 TABLET BY MOUTH TWICE A DAY   Multiple Vitamin (MULTIVITAMIN) capsule Take 1 capsule by mouth daily as needed (nutrition).   nitroGLYCERIN (NITROSTAT) 0.4 MG SL tablet Place 1 tablet (0.4 mg total) under the tongue every 5 (five) minutes as needed.   Omega-3 1000 MG CAPS Take 1,000 mg by mouth daily.   pravastatin (PRAVACHOL) 20 MG  tablet TAKE 1 TABLET BY MOUTH EVERY DAY IN THE EVENING     Allergies:   Vancomycin   Social History   Socioeconomic History   Marital status: Married    Spouse name: Not on file   Number of children: Not on file   Years of education: Not on file   Highest education level: Not on file  Occupational History   Not on file  Tobacco Use   Smoking status: Every Day    Packs/day: 0.50    Types: Cigarettes   Smokeless tobacco: Never  Vaping Use   Vaping Use: Never used  Substance and Sexual Activity   Alcohol use: Yes    Alcohol/week: 1.0 standard drink of alcohol    Types: 1 Glasses of wine per week    Comment: Socially   Drug use: Never   Sexual activity: Not on file  Other Topics Concern   Not on file  Social History Narrative   Not on file   Social Determinants of Health   Financial Resource Strain: Not on file  Food Insecurity: Not on file  Transportation  Needs: Not on file  Physical Activity: Not on file  Stress: Not on file  Social Connections: Not on file     Family History: The patient's family history includes Angina in her father; Diabetes in her mother and paternal grandmother; Heart attack (age of onset: 54) in her father; Heart attack (age of onset: 67) in her mother. ROS:   Please see the history of present illness.    All other systems reviewed and are negative.  EKGs/Labs/Other Studies Reviewed:    The following studies were reviewed today:  TEE 05/01/2021:  1. Left ventricular ejection fraction, by estimation, is 55 to 60%. The  left ventricle has normal function.   2. Right ventricular systolic function is normal. The right ventricular  size is normal.   3. No left atrial/left atrial appendage thrombus was detected.   4. The mitral valve is grossly normal. Mild mitral valve regurgitation.  No evidence of mitral stenosis.   5. The aortic valve is tricuspid. Aortic valve regurgitation is not  visualized. No aortic stenosis is present.   6.  There is mild (Grade II) layered plaque involving the descending  aorta.   7. Agitated saline contrast bubble study was negative, with no evidence  of any interatrial shunt.    Cardiac CTA 03/05/2021: FINDINGS: A 100 kV prospective scan was triggered in the descending thoracic aorta at 111 HU's. Axial non-contrast 3 mm slices were carried out through the heart. The data set was analyzed on a dedicated work station and scored using the Agatson method. Gantry rotation speed was 250 msecs and collimation was .6 mm. No beta blockade and 0.8 mg of sl NTG was given. The 3D data set was reconstructed in 5% intervals of the 67-82 % of the R-R cycle. Diastolic phases were analyzed on a dedicated work station using MPR, MIP and VRT modes. The patient received 95 cc of contrast.   Aorta:  Normal size.  No calcifications.  No dissection.   Main Pulmonary Artery: Normal size of the pulmonary artery.   Aortic Valve:  Tri-leaflet.  No calcifications.   Coronary Arteries:  Normal coronary origin.  Co-dominance.   Coronary Calcium Score:   Left main: 0   Left anterior descending artery: 131   Left circumflex artery: 20   Right coronary artery: 10   Total: 161   Percentile: 86th for age, sex, and race matched control.   RCA is a large co-dominant artery that gives rise to PDA and PLA. There is appears to be a severe stenosis 70-99% in the proximal vessel.   Left main is a large artery that gives rise to LAD and LCX arteries. There is no plaque.   LAD is a large vessel that gives rise to one large D1 Branch. There is a mixed plaque moderate stenosis (50-70%) in the mid LAD. There is a mild non-obstructive (25-49%) calcified plaque in the distal LAD.   LCX is a co-dominant artery that gives rise to one large OM1 branch. There is a mild-non obstructive (24-49%) non calcified plaque the mid LCX.   Other findings:   Normal pulmonary vein drainage into the left atrium.   Normal  left atrial appendage without a thrombus.   Extra-cardiac findings: See attached radiology report for non-cardiac structures.   IMPRESSION: 1. Coronary calcium score of 161. This was 86th percentile for age, sex, and race matched control.   2. Normal coronary origin with co-dominance.   3. CAD-RADS 4 Severe stenosis. (70-99% or > 50%  left main). CT-FFR will be sent as time sensitive. Consider symptom-guided anti-ischemic pharmacotherapy as well as risk factor modification per guideline directed care. Cardiac catheterization may be appropriate.   Left heart cath:03/12/2021 Conclusion Prox RCA lesion is 100% stenosed. Prox LAD lesion is 45% stenosed. 2nd Diag lesion is 50% stenosed. Mid LAD lesion is 50% stenosed. Dist LAD lesion is 50% stenosed. Prox Cx lesion is 60% stenosed. 2nd Mrg-1 lesion is 50% stenosed. 2nd Mrg-2 lesion is 60% stenosed. Mid Cx to Dist Cx lesion is 20% stenosed. Dist RCA lesion is 50% stenosed. The left ventricular ejection fraction is greater than 65% by visual estimate. The left ventricular systolic function is normal. LV end diastolic pressure is mildly elevated. Dist Cx lesion is 30% stenosed.   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   Dynamic LV function with EF estimated least 65 to 70%.  There were no focal segmental wall motion abnormalities.  LVEDP 19 mmHg.   RECOMMENDATION: Medical therapy trial.  The patient was hypertensive on presentation.  I have elected to add amlodipine 5 mg to her medical regimen which would be helpful both for hypertension and anti-ischemic benefit.   With her high vessel coronary plaque recommend aggressive high potency statin therapy and have suggested discontinuance of low-dose pravastatin and change to atorvastatin 80 mg.  Consider outpatient titration of metoprolol blood pressure and heart rate allow.   Coronary Diagrams   Diagnostic Dominance: Right  EKG:  EKG ordered today and personally reviewed.  The ekg ordered today demonstrates sinus rhythm nonspecific ST minor otherwise normal  Recent Labs: 10/21/2021: ALT 18; BUN 23; Creat 1.11; Hemoglobin 14.1; Platelets 340; Potassium 4.9; Sodium 136  Recent Lipid Panel    Component Value Date/Time   CHOL 87 04/05/2021 1802   TRIG 121 04/05/2021 1802   HDL 19 (L) 04/05/2021 1802   CHOLHDL 4.6 04/05/2021 1802   VLDL 24 04/05/2021 1802   LDLCALC 44 04/05/2021 1802    Physical Exam:    VS:  BP 132/82 (BP Location: Right Arm, Patient Position: Sitting)   Pulse 71   Ht 5\' 6"  (1.676 m)   Wt 187 lb (84.8 kg)   LMP  (LMP Unknown)   SpO2 96%   BMI 30.18 kg/m     Wt Readings from Last 3 Encounters:  09/11/22 187 lb (84.8 kg)  10/21/21 174 lb (78.9 kg)  10/08/21 177 lb (80.3 kg)     GEN:  Well nourished, well developed in no acute distress HEENT: Normal NECK: No JVD; No carotid bruits LYMPHATICS: No lymphadenopathy CARDIAC: RRR, no murmurs, rubs, gallops RESPIRATORY:  Clear to auscultation without rales, wheezing or rhonchi  ABDOMEN: Soft, non-tender, non-distended MUSCULOSKELETAL:  No edema; No deformity  SKIN: Warm and dry NEUROLOGIC:  Alert and oriented x 3 PSYCHIATRIC:  Normal affect    Signed, Shirlee More, MD  09/11/2022 4:46 PM    [facility]

## 2022-09-11 ENCOUNTER — Encounter: Payer: Self-pay | Admitting: Cardiology

## 2022-09-11 ENCOUNTER — Ambulatory Visit: Payer: Medicare Other | Attending: Cardiology | Admitting: Cardiology

## 2022-09-11 VITALS — BP 132/82 | HR 71 | Ht 66.0 in | Wt 187.0 lb

## 2022-09-11 DIAGNOSIS — E782 Mixed hyperlipidemia: Secondary | ICD-10-CM | POA: Diagnosis not present

## 2022-09-11 DIAGNOSIS — I25118 Atherosclerotic heart disease of native coronary artery with other forms of angina pectoris: Secondary | ICD-10-CM

## 2022-09-11 DIAGNOSIS — E119 Type 2 diabetes mellitus without complications: Secondary | ICD-10-CM | POA: Diagnosis not present

## 2022-09-11 DIAGNOSIS — I10 Essential (primary) hypertension: Secondary | ICD-10-CM

## 2022-09-11 MED ORDER — NITROGLYCERIN 0.4 MG SL SUBL: 0.4000 mg | SUBLINGUAL_TABLET | SUBLINGUAL | 3 refills | Status: AC | PRN

## 2022-09-11 NOTE — Patient Instructions (Addendum)
Medication Instructions:  Your physician recommends that you continue on your current medications as directed. Please refer to the Current Medication list given to you today.  *If you need a refill on your cardiac medications before your next appointment, please call your pharmacy*   Lab Work: Your physician recommends that you return for lab work in: Today for a CMP and Lipid Panel  If you have labs (blood work) drawn today and your tests are completely normal, you will receive your results only by: MyChart Message (if you have [place]) OR A paper copy in the mail If you have any lab test that is abnormal or we need to change your treatment, we will call you to review the results.   Testing/Procedures: NONE   Follow-Up: At [facility], you and your health needs are our priority.  As part of our continuing mission to provide you with exceptional heart care, we have created designated Provider Care Teams.  These Care Teams include your primary Cardiologist (physician) and Advanced Practice Providers (APPs -  Physician Assistants and Nurse Practitioners) who all work together to provide you with the care you need, when you need it.  We recommend signing up for the patient portal called "MyChart".  Sign up information is provided on this After Visit Summary.  MyChart is used to connect with patients for Virtual Visits (Telemedicine).  Patients are able to view lab/test results, encounter notes, upcoming appointments, etc.  Non-urgent messages can be sent to your provider as well.   To learn more about what you can do with MyChart, go to [email or web address].    Your next appointment:   9 month(s)  The format for your next appointment:   In Person  Provider:   Shirlee More, MD    Other Instructions Increase activity to 6000 steps a day  Important Information About Sugar

## 2022-09-12 LAB — COMPREHENSIVE METABOLIC PANEL
ALT: 20 IU/L (ref 0–32)
AST: 19 IU/L (ref 0–40)
Albumin/Globulin Ratio: 1.8 (ref 1.2–2.2)
Albumin: 4.5 g/dL (ref 3.9–4.9)
Alkaline Phosphatase: 106 IU/L (ref 44–121)
BUN/Creatinine Ratio: 11 — ABNORMAL LOW (ref 12–28)
BUN: 9 mg/dL (ref 8–27)
Bilirubin Total: <0.2 mg/dL (ref 0.0–1.2)
CO2: 26 mmol/L (ref 20–29)
Calcium: 9.9 mg/dL (ref 8.7–10.3)
Chloride: 103 mmol/L (ref 96–106)
Creatinine, Ser: 0.83 mg/dL (ref 0.57–1.00)
Globulin, Total: 2.5 g/dL (ref 1.5–4.5)
Glucose: 146 mg/dL — ABNORMAL HIGH (ref 70–99)
Potassium: 4.9 mmol/L (ref 3.5–5.2)
Sodium: 145 mmol/L — ABNORMAL HIGH (ref 134–144)
Total Protein: 7 g/dL (ref 6.0–8.5)
eGFR: 77 mL/min/{1.73_m2} (ref >59)

## 2022-09-12 LAB — LIPID PANEL
Chol/HDL Ratio: 4.5 ratio — ABNORMAL HIGH (ref 0.0–4.4)
Cholesterol, Total: 197 mg/dL (ref 100–199)
HDL: 44 mg/dL (ref >39)
LDL Chol Calc (NIH): 75 mg/dL (ref 0–99)
Triglycerides: 499 mg/dL — ABNORMAL HIGH (ref 0–149)
VLDL Cholesterol Cal: 78 mg/dL — ABNORMAL HIGH (ref 5–40)

## 2022-09-14 ENCOUNTER — Other Ambulatory Visit: Payer: Self-pay

## 2022-09-14 MED ORDER — OMEGA-3-ACID ETHYL ESTERS 1 G PO CAPS: 1.0000 g | ORAL_CAPSULE | Freq: Two times a day (BID) | ORAL | 3 refills | Status: AC

## 2023-02-24 IMAGING — MR MR ORBITS WO/W CM
4 of 8 series · 16 of 48 positions shown · IV contrast (8.5 M GAD)
Comparison: Prior CT from 05/02/2011.

CLINICAL DATA: Initial evaluation for acute headache, papilledema,
history of MRSA bacteremia.

EXAM:
MRI HEAD AND ORBITS WITHOUT AND WITH CONTRAST
TECHNIQUE: Multiplanar, multiecho pulse sequences of the brain and surrounding
structures were obtained without and with intravenous contrast.
Multiplanar, multiecho pulse sequences of the orbits and surrounding
structures were obtained including fat saturation techniques, before
and after intravenous contrast administration.
CONTRAST:  8.5mL GADAVIST GADOBUTROL 1 MMOL/ML IV SOLN

[Series 9: T2 fat-sat · coronal · 4.0mm · 0.35mm/px · 4 of 24 slices shown (1 of 2)]
[im 1/24]
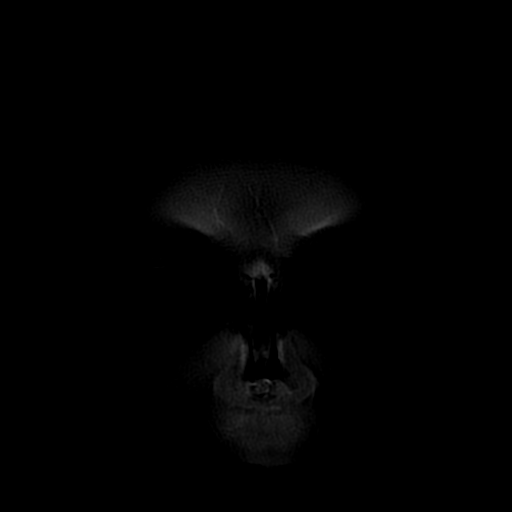
[im 8/24]
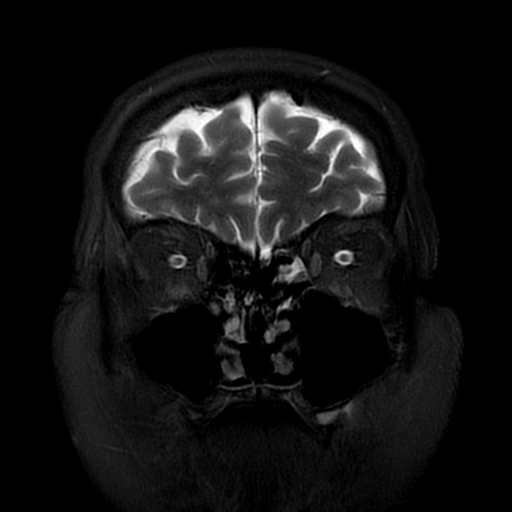
[im 16/24]
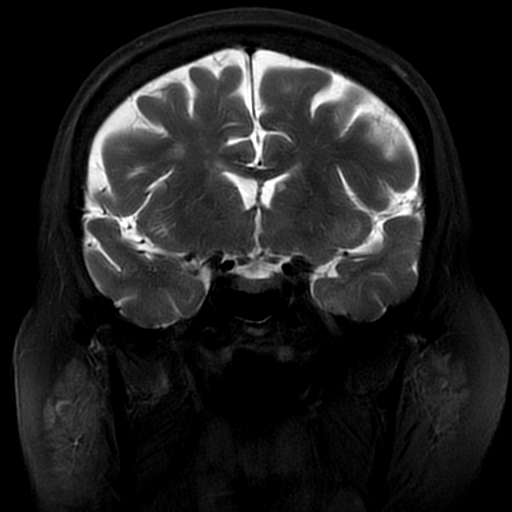
[im 24/24]
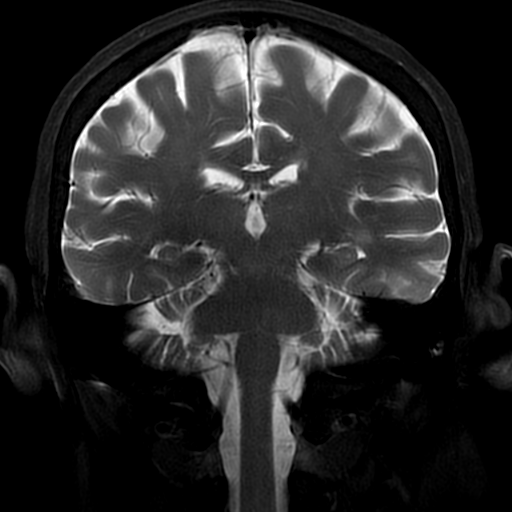

[Series 10: T2 fat-sat · axial · 3.0mm · 0.35mm/px · z∈[-88,-33]mm · 5 of 20 slices shown (2 of 2)]
[im 1/20]
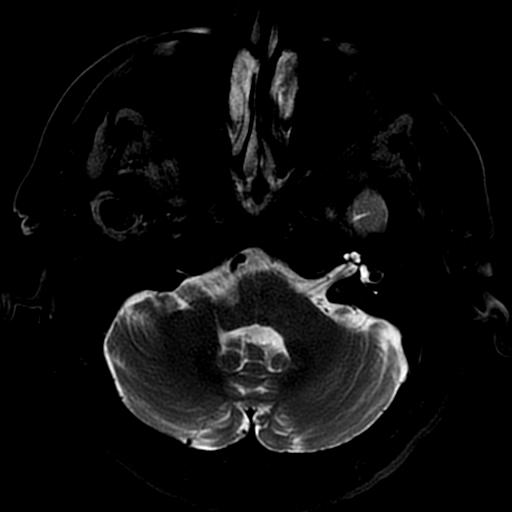
[im 5/20]
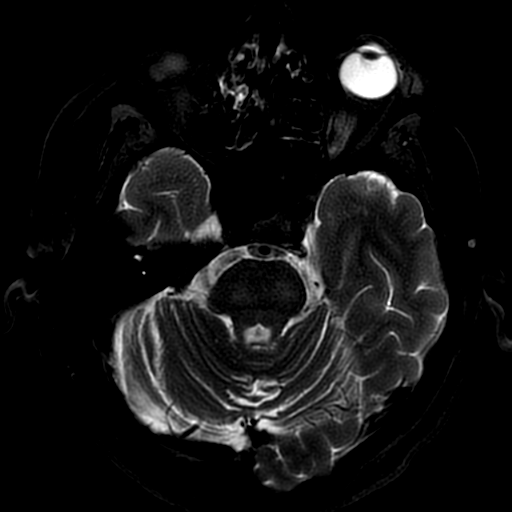
[im 10/20]
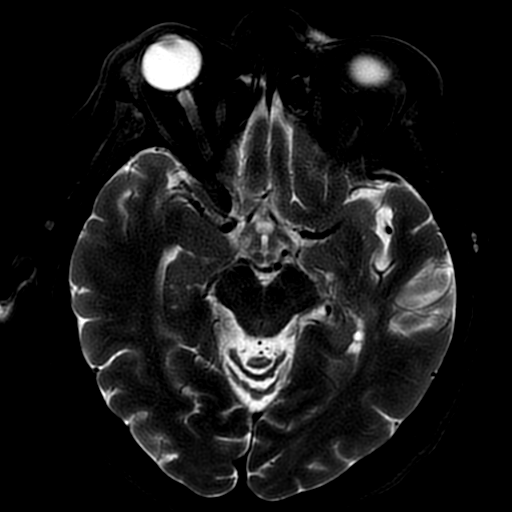
[im 15/20]
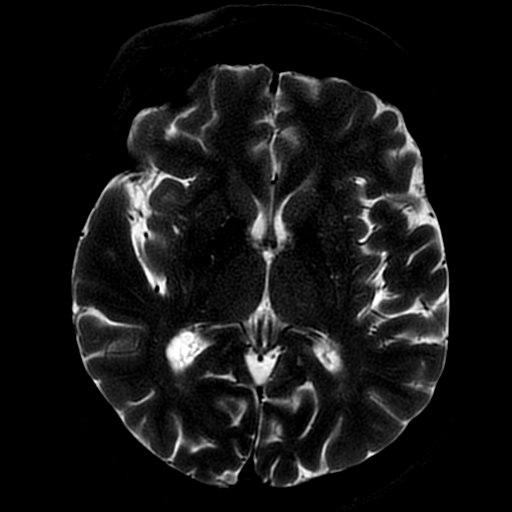
[im 20/20]
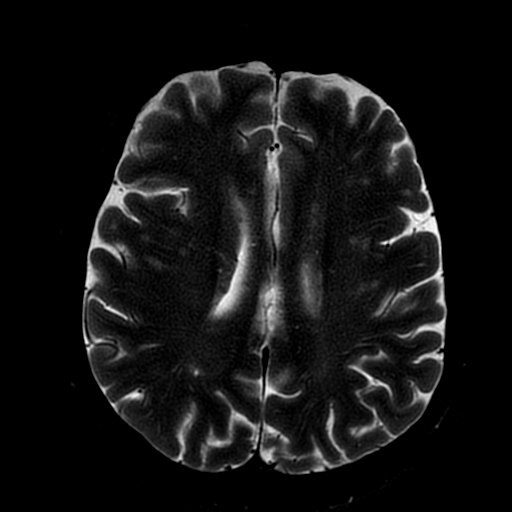

[Series 11: T1 · coronal · 4.0mm · 0.35mm/px · 4 of 24 slices shown (1 of 2)]
[im 1/24]
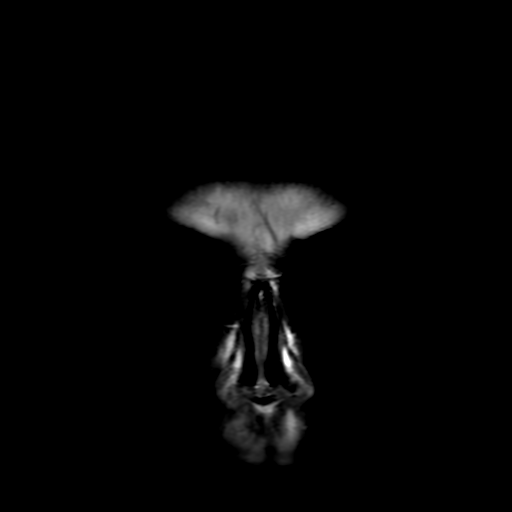
[im 6/24]
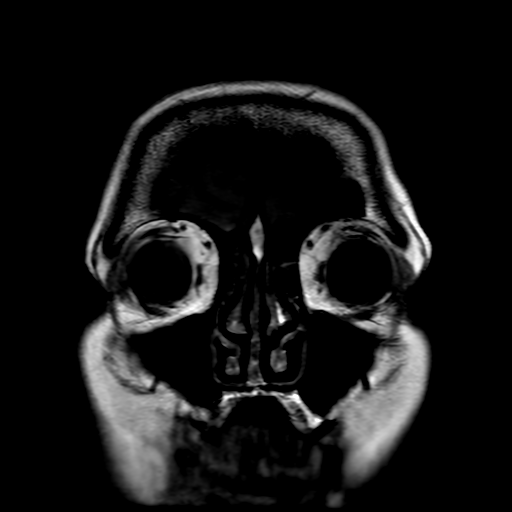
[im 12/24]
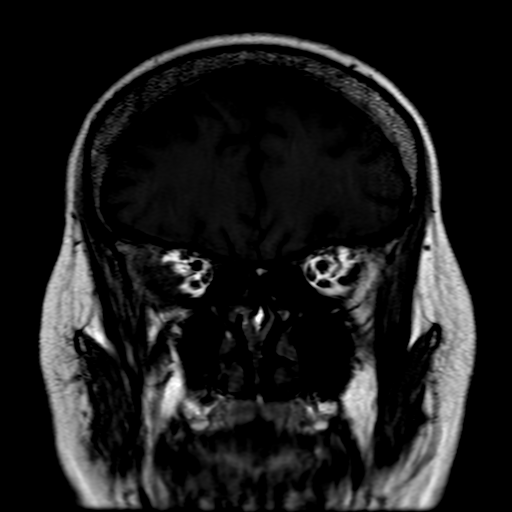
[im 24/24]
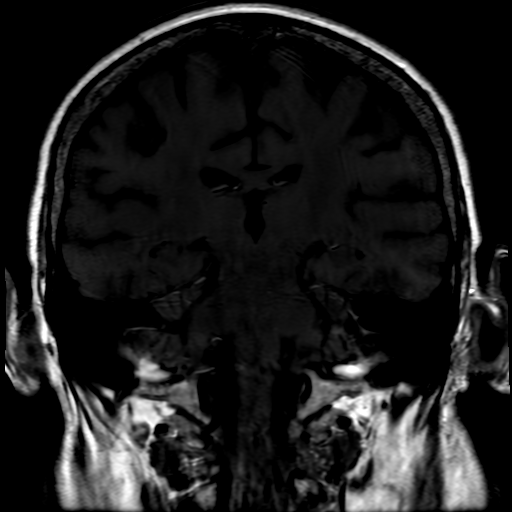

[Series 12: T1 · axial · 3.0mm · 0.35mm/px · z∈[-88,-33]mm · 3 of 20 slices shown (2 of 2)]
[im 1/20]
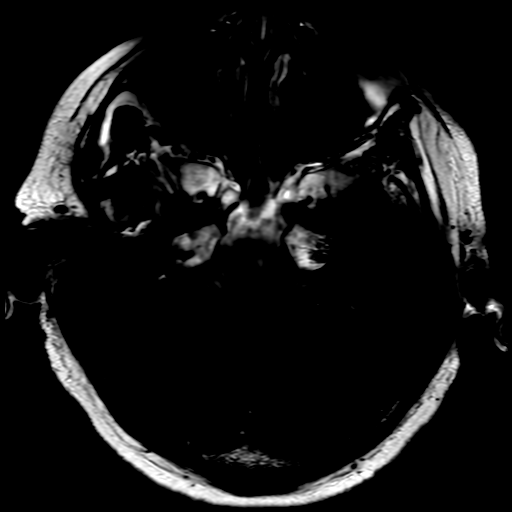
[im 10/20]
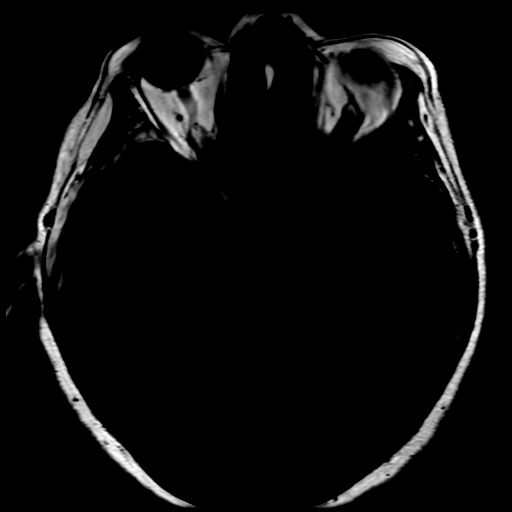
[im 20/20]
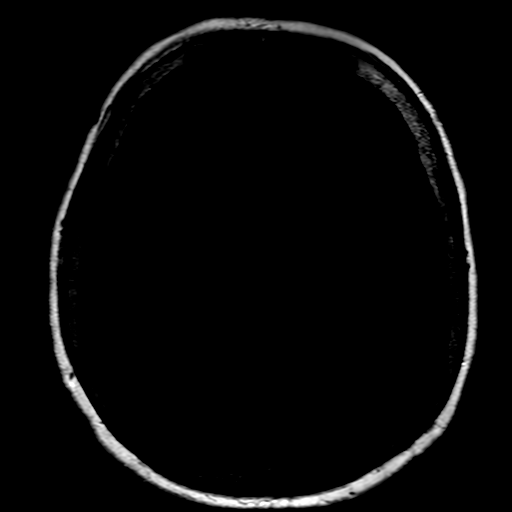

[16 of 48 positions shown; findings below may reference images not displayed]

FINDINGS: MRI HEAD FINDINGS

Brain: Cerebral volume within normal limits for age. Scattered
patchy T2/FLAIR hyperintensity seen involving the periventricular,
deep, and subcortical white matter both cerebral hemispheres as well
as the pons, most likely related chronic microvascular ischemic
disease, moderate in nature.

7 mm acute ischemic infarcts seen involving the left caudate head
(series 2, image 30). Few additional scattered subcentimeter
ischemic infarcts noted involving the cortical and subcortical left
parietal lobe (series 2, images 34, 33) as well as the left splenium
(series 2, image 27). Suspected additional subtle acute to subacute
ischemic infarct noted involving the right temporal lobe (series 2,
image 19). Suspected subtle associated enhancement about the right
temporal infarct (series 17, image 11). No other associated
enhancement. No mass effect or associated hemorrhage.

Otherwise, gray-white matter differentiation maintained. No
encephalomalacia to suggest chronic cortical infarction elsewhere
within the brain. No other foci of susceptibility artifact to
suggest acute or chronic intracranial hemorrhage.

No mass lesion, midline shift or mass effect. No hydrocephalus or
extra-axial fluid collection. No intraventricular effusion or
debris. Pituitary gland suprasellar region within normal limits.
Midline structures intact and normal.

On corresponding orbit portion of this exam, there is question of
diffuse left a meningeal enhancement involving both cerebral
hemispheres (series 14, image 1). Finding not entirely certain, and
could be technical in nature. No other abnormal enhancement.

Vascular: Major intracranial vascular flow voids are well
maintained.

Skull and upper cervical spine: Craniocervical junction within
normal limits. Bone marrow signal intensity normal. No scalp soft
tissue abnormality.

Other: Trace left mastoid effusion, of doubtful significance. Inner
ear structures grossly normal.

MRI ORBITS FINDINGS

Orbits: Globes are symmetric in size with normal appearance and
morphology bilaterally. Apparent asymmetric FLAIR signal abnormality
involving the right globe felt to be artifactual on this exam, with
no signal abnormality seen on corresponding sequences. Optic nerves
within normal limits without intrinsic edema or abnormal
enhancement. No abnormality about the optic nerve sheaths.
Intraconal and extraconal fat well-maintained. Extra-ocular muscles
symmetric and within normal limits. Lacrimal glands normal. No
abnormality about the orbital apices. Cavernous sinus within normal
limits. Superior orbital veins symmetric and unremarkable. Optic
chiasm normally situated within the suprasellar cistern without
abnormality. No evidence for acute infection about either orbit.
Question diffuse leptomeningeal enhancement involving the visualized
cerebral hemispheres as above.

Visualized sinuses: Scattered mucosal thickening noted about the
ethmoidal air cells. Visualized paranasal sinuses are otherwise
clear.

Soft tissues: Unremarkable.
IMPRESSION: 1. Few scattered subcentimeter acute ischemic nonhemorrhagic
infarcts involving the left caudate head, left parietal lobe, left
splenium, and right temporal lobe as above. No associated hemorrhage
or mass effect.
2. Question diffuse leptomeningeal enhancement involving both
cerebral hemispheres as above. Finding not entirely certain, and
could be technical in nature on this exam. However, possible
meningitis should be considered in the setting of MRSA bacteremia
and scattered ischemic infarcts. Correlation with LP and CSF studies
recommended for further evaluation.
3. No other acute intracranial abnormality. No other evidence for
acute CNS or intraorbital infection.
4. Moderate chronic microvascular ischemic disease.

## 2023-02-24 IMAGING — MR MR LUMBAR SPINE WO/W CM
4 of 8 series · 21 of 48 positions shown · IV contrast (Gadavist)
Comparison: Prior CT from 03/31/2024.

CLINICAL DATA: Initial evaluation for intermittent left flank pain
with radiation into the left lower back.

EXAM:
MRI LUMBAR SPINE WITHOUT AND WITH CONTRAST
TECHNIQUE: Multiplanar and multiecho pulse sequences of the lumbar spine were
obtained without and with intravenous contrast.
CONTRAST:  8.5mL GADAVIST GADOBUTROL 1 MMOL/ML IV SOLN

[Series 5: T2 · sagittal · 4.0mm · 0.73mm/px · 3 of 16 slices shown (1 of 2)]
[im 1/16]
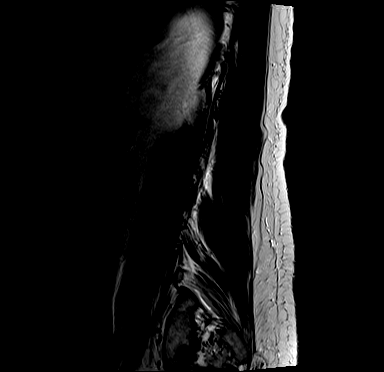
[im 8/16]
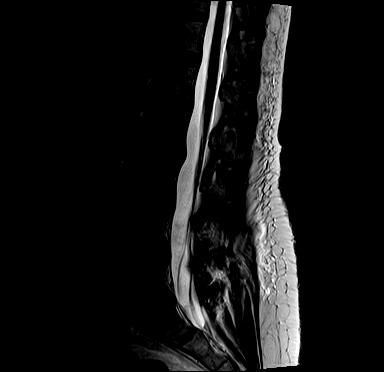
[im 16/16]
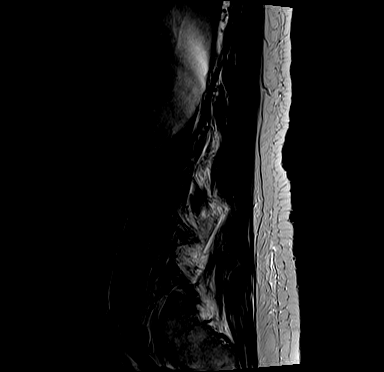

[Series 7: T1 · sagittal · 4.0mm · 0.88mm/px · 4 of 16 slices shown (1 of 2)]
[im 1/16]
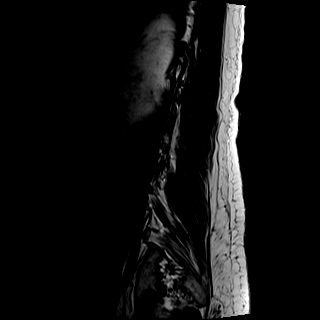
[im 6/16]
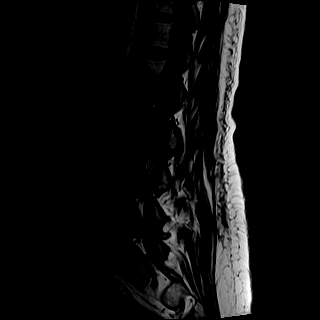
[im 11/16]
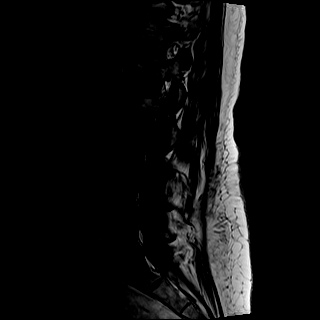
[im 16/16]
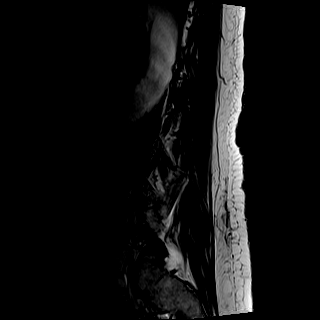

[Series 8: T2 · axial · 4.0mm · 0.57mm/px · z∈[-152,+78]mm · 8 of 40 slices shown (2 of 2)]
[im 1/40]
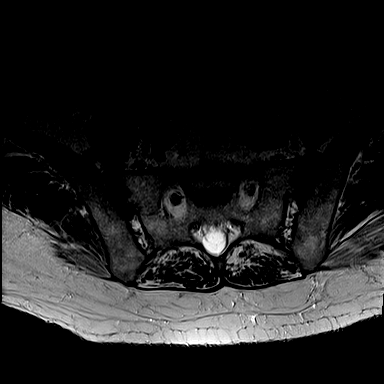
[im 5/40]
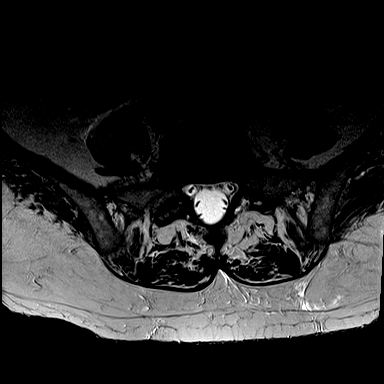
[im 14/40]
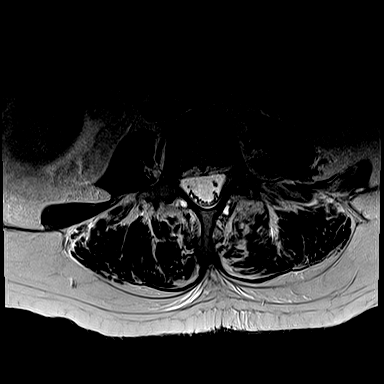
[im 18/40]
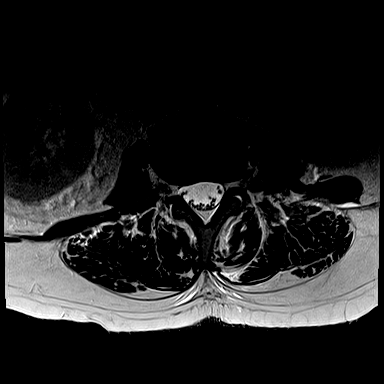
[im 22/40]
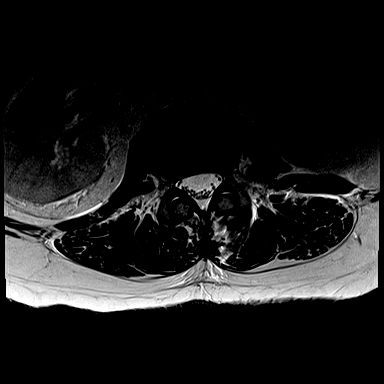
[im 27/40]
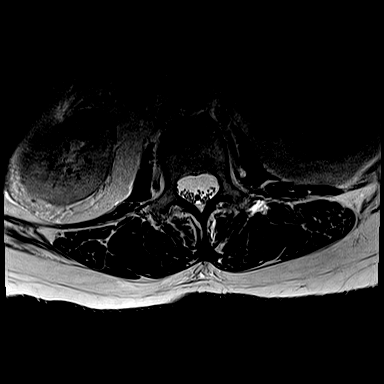
[im 35/40]
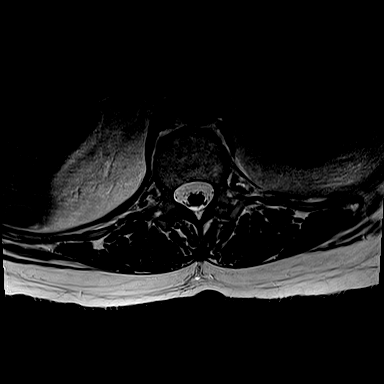
[im 40/40]
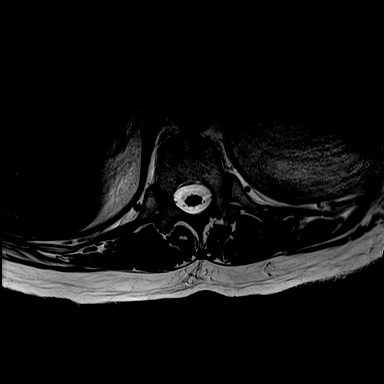

[Series 9: T1 · axial · 4.0mm · 0.34mm/px · z∈[-152,+53]mm · 6 of 40 slices shown (2 of 2)]
[im 1/40]
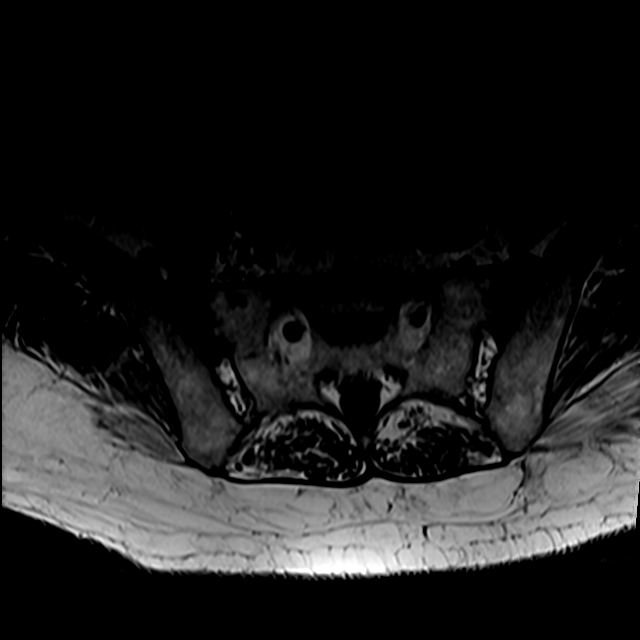
[im 5/40]
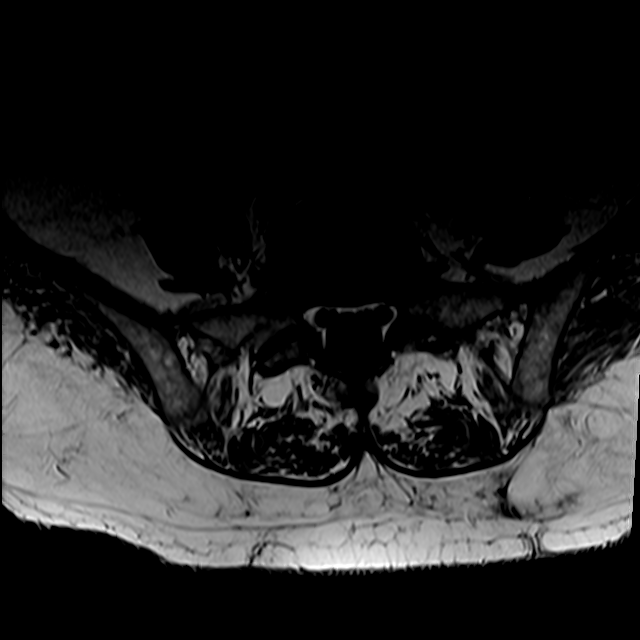
[im 14/40]
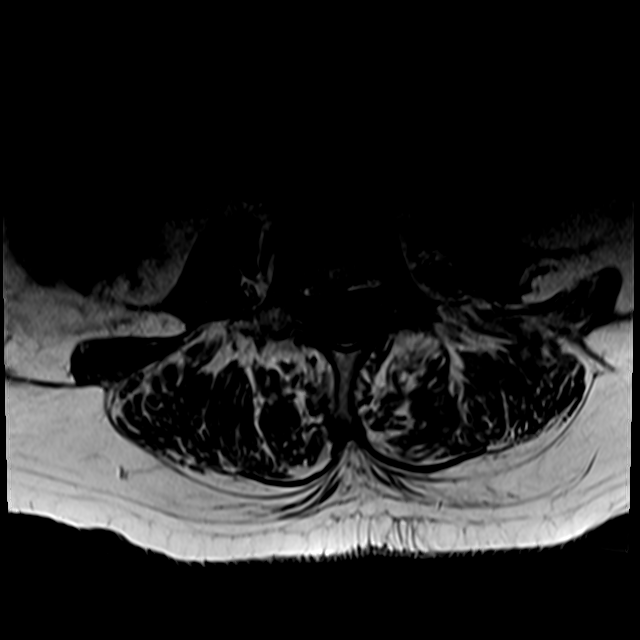
[im 18/40]
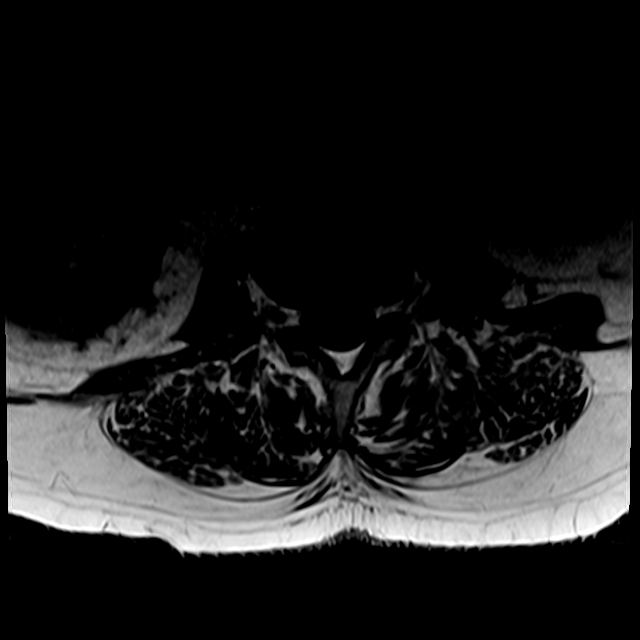
[im 22/40]
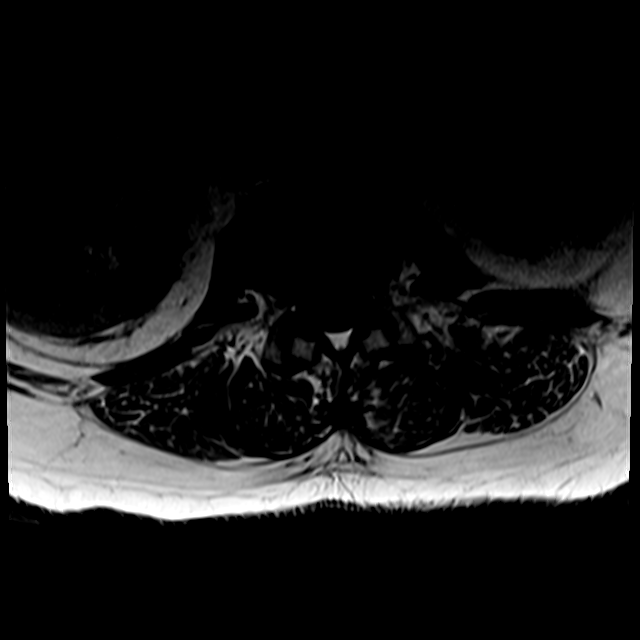
[im 35/40]
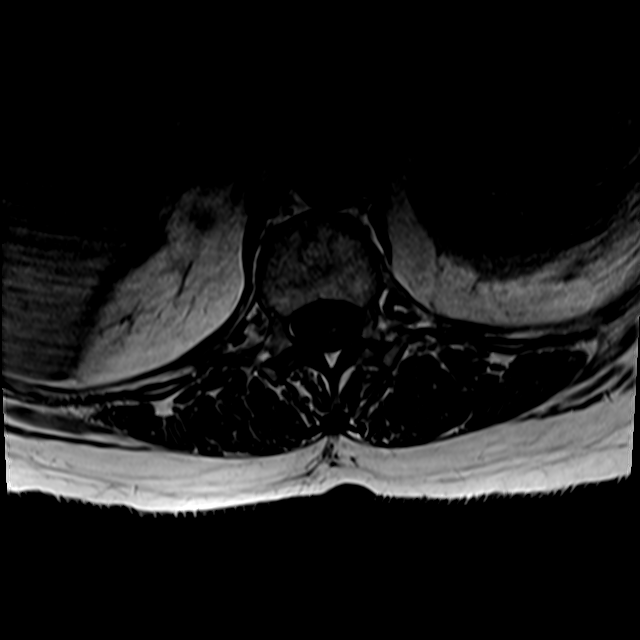

[21 of 48 positions shown; findings below may reference images not displayed]

FINDINGS: Segmentation: Standard. Lowest well-formed disc space labeled the
L5-S1 level.

Alignment: Physiologic with preservation of the normal lumbar
lordosis. No listhesis.

Vertebrae: Vertebral body height maintained without acute or chronic
fracture. Bone marrow signal intensity within normal limits. No
worrisome osseous lesions. Joint effusions noted about the left
greater than right L4-5 facets without significant marrow edema. No
other abnormal marrow edema or enhancement to suggest osteomyelitis
discitis.

Conus medullaris and cauda equina: Conus extends to the L2 level.
Conus and cauda equina appear normal.

Paraspinal and other soft tissues: Diffuse edema and enhancement
seen about the left greater than right lower posterior paraspinous
musculature, most pronounced about the left L4-5 facet (series 6,
image 12) small collection emanating from the left L4-5 facet as
below. No other soft tissue collections. Mild scattered perinephric
and retroperitoneal stranding noted, of uncertain significance, also
seen on prior CT. Left periaortic lymph node measures 1 cm in short
access, upper limits of normal. Visualized visceral structures
otherwise unremarkable.

Disc levels:

L1-2: Negative interspace. Minimal facet spurring. No canal or
foraminal stenosis.

L2-3: Mild disc bulge with disc desiccation. Small biforaminal
annular fissures noted. Mild facet hypertrophy with associated trace
joint effusions. No significant spinal stenosis. Foramina remain
patent.

L3-4: Disc desiccation with mild annular disc bulge. Superimposed
small left foraminal to extraforaminal disc protrusion closely
approximates the exiting left L3 nerve root (series 8, image 23).
Mild facet spurring with associated trace joint effusions. Tiny T2
hyperintense lesions measuring 4-5 mm situated posterior to the L3-4
facets noted, possibly reflecting small synovial cysts (series 8,
image 27). These are not in a position to cause neural compromise.
No spinal stenosis. Foramina remain patent.

L4-5: Mild disc bulge with disc desiccation. Superimposed small left
foraminal disc protrusion closely approximates and/or contacts the
exiting left L4 nerve root (series 7, image 13). Moderate left worse
than right facet hypertrophy with associated small joint effusions.
There is a superimposed cystic collection/lesion along the
anteromedial aspect of the left L4-5 facet, measuring 1.2 x 0.5 x
1.3 cm (series 6, image 11). Lesion contacts and mildly displaces
the descending left L5 nerve root in the left lateral recess with
resultant mild left lateral recess stenosis. There is an additional
T2 hyperintense lesion situated posterior to the L4-5 facet
measuring 1.6 x 0.9 x 1.2 cm (series 8, image 33). These lesions
demonstrate peripheral enhancement. Overall, findings are favored to
reflect acute facet arthritis with associated synovial cysts.
Possible septic arthritis with associated small collections
difficult to exclude, and could be considered in the correct
clinical setting. No other significant spinal stenosis. Mild left
foraminal narrowing. Right neural foramina remains patent.

L5-S1: Minimal disc bulge with disc desiccation. Small central
annular fissure. Mild bilateral facet hypertrophy. No canal or
lateral recess stenosis. Foramina remain patent.
IMPRESSION: 1. Joint effusion with surrounding enhancement about the left L4-5
facet, with associated two cystic collections/lesions measuring up
to 1.6 cm as above. Overall, findings are favored to reflect changes
of acute facet arthritis with associated synovial cysts. Possible
septic arthritis with associated small collections difficult to
exclude, and could be considered in the correct clinical setting.
Secondary mass effect on the descending left L5 nerve root by one of
these collections with resultant mild left lateral recess stenosis,
which could contribute to left lower extremity symptoms.
2. Abnormal edema and enhancement within the left greater than right
lower posterior paraspinous musculature, greatest about the left
L4-5 facet. Findings suggestive of acute myositis, which could be
either infectious or inflammatory in nature. Possible muscular
injury/strain could also be considered in the correct clinical
setting.
3. Additional small left foraminal to extraforaminal disc
protrusions at L3-4 and L4-5, potentially affecting the exiting left
L3 and L4 nerve roots respectively.
4. No other evidence for acute infection within the lumbar spine.

## 2023-03-07 ENCOUNTER — Other Ambulatory Visit: Payer: Self-pay | Admitting: Cardiology

## 2023-10-19 ENCOUNTER — Other Ambulatory Visit: Payer: Self-pay | Admitting: Cardiology

## 2024-03-17 ENCOUNTER — Other Ambulatory Visit: Payer: Self-pay | Admitting: Cardiology

## 2024-04-19 ENCOUNTER — Other Ambulatory Visit: Payer: Self-pay | Admitting: Cardiology
